# Patient Record
Sex: Male | Born: 1960
Health system: Southern US, Community
[De-identification: ages and names within clinical notes are randomized; demographics above are authoritative.]

## PROBLEM LIST (undated history)

## (undated) DIAGNOSIS — I4819 Other persistent atrial fibrillation: Secondary | ICD-10-CM

## (undated) DIAGNOSIS — I5042 Chronic combined systolic (congestive) and diastolic (congestive) heart failure: Secondary | ICD-10-CM

## (undated) DIAGNOSIS — I472 Ventricular tachycardia, unspecified: Secondary | ICD-10-CM

## (undated) DIAGNOSIS — Z95810 Presence of automatic (implantable) cardiac defibrillator: Secondary | ICD-10-CM

## (undated) DIAGNOSIS — I509 Heart failure, unspecified: Secondary | ICD-10-CM

## (undated) DIAGNOSIS — I340 Nonrheumatic mitral (valve) insufficiency: Secondary | ICD-10-CM

## (undated) DIAGNOSIS — R51 Headache: Secondary | ICD-10-CM

## (undated) DIAGNOSIS — R55 Syncope and collapse: Secondary | ICD-10-CM

## (undated) DIAGNOSIS — R42 Dizziness and giddiness: Secondary | ICD-10-CM

## (undated) DIAGNOSIS — R5383 Other fatigue: Secondary | ICD-10-CM

## (undated) DIAGNOSIS — R5381 Other malaise: Secondary | ICD-10-CM

## (undated) DIAGNOSIS — R519 Headache, unspecified: Secondary | ICD-10-CM

## (undated) DIAGNOSIS — I071 Rheumatic tricuspid insufficiency: Secondary | ICD-10-CM

## (undated) DIAGNOSIS — Z87442 Personal history of urinary calculi: Secondary | ICD-10-CM

## (undated) HISTORY — DX: Nonrheumatic mitral (valve) insufficiency: I34.0

## (undated) HISTORY — DX: Ventricular tachycardia, unspecified: I47.20

## (undated) HISTORY — DX: Rheumatic tricuspid insufficiency: I07.1

## (undated) HISTORY — DX: Other malaise: R53.81

## (undated) HISTORY — PX: CARDIAC CATHETERIZATION: SHX172

## (undated) HISTORY — PX: OTHER SURGICAL HISTORY: SHX169

## (undated) HISTORY — DX: Chronic combined systolic (congestive) and diastolic (congestive) heart failure: I50.42

## (undated) HISTORY — DX: Other malaise: R53.83

## (undated) HISTORY — DX: Ventricular tachycardia: I47.2

## (undated) HISTORY — DX: Dizziness and giddiness: R42

## (undated) HISTORY — PX: CARDIAC DEFIBRILLATOR PLACEMENT: SHX171

## (undated) HISTORY — DX: Other persistent atrial fibrillation: I48.19

## (undated) HISTORY — PX: LITHOTRIPSY: SUR834

## (undated) HISTORY — DX: Syncope and collapse: R55

## (undated) HISTORY — DX: Presence of automatic (implantable) cardiac defibrillator: Z95.810

---

## 1998-08-09 ENCOUNTER — Encounter: Payer: Self-pay | Admitting: Cardiology

## 1998-08-09 ENCOUNTER — Ambulatory Visit (HOSPITAL_COMMUNITY): Admission: RE | Admit: 1998-08-09 | Discharge: 1998-08-09 | Payer: Self-pay | Admitting: Cardiology

## 1998-08-16 ENCOUNTER — Ambulatory Visit (HOSPITAL_COMMUNITY): Admission: RE | Admit: 1998-08-16 | Discharge: 1998-08-16 | Payer: Self-pay | Admitting: Cardiology

## 1998-10-15 ENCOUNTER — Observation Stay (HOSPITAL_COMMUNITY): Admission: AD | Admit: 1998-10-15 | Discharge: 1998-10-16 | Payer: Self-pay | Admitting: Internal Medicine

## 1998-10-15 ENCOUNTER — Encounter: Payer: Self-pay | Admitting: Internal Medicine

## 1998-10-16 ENCOUNTER — Encounter: Payer: Self-pay | Admitting: Internal Medicine

## 2001-03-02 HISTORY — PX: LAPAROSCOPIC CHOLECYSTECTOMY: SUR755

## 2001-09-07 ENCOUNTER — Inpatient Hospital Stay (HOSPITAL_COMMUNITY): Admission: EM | Admit: 2001-09-07 | Discharge: 2001-09-08 | Payer: Self-pay | Admitting: Emergency Medicine

## 2001-09-07 ENCOUNTER — Encounter: Payer: Self-pay | Admitting: Emergency Medicine

## 2001-11-14 ENCOUNTER — Encounter (INDEPENDENT_AMBULATORY_CARE_PROVIDER_SITE_OTHER): Payer: Self-pay | Admitting: *Deleted

## 2001-11-15 ENCOUNTER — Inpatient Hospital Stay (HOSPITAL_COMMUNITY): Admission: EM | Admit: 2001-11-15 | Discharge: 2001-11-17 | Payer: Self-pay | Admitting: Emergency Medicine

## 2001-11-15 ENCOUNTER — Encounter: Payer: Self-pay | Admitting: Cardiology

## 2001-11-16 ENCOUNTER — Encounter: Payer: Self-pay | Admitting: Cardiology

## 2002-02-07 ENCOUNTER — Ambulatory Visit (HOSPITAL_COMMUNITY): Admission: RE | Admit: 2002-02-07 | Discharge: 2002-02-07 | Payer: Self-pay | Admitting: Pulmonary Disease

## 2002-02-07 ENCOUNTER — Encounter: Payer: Self-pay | Admitting: Pulmonary Disease

## 2003-10-28 ENCOUNTER — Emergency Department (HOSPITAL_COMMUNITY): Admission: EM | Admit: 2003-10-28 | Discharge: 2003-10-28 | Payer: Self-pay | Admitting: Emergency Medicine

## 2003-11-13 ENCOUNTER — Observation Stay (HOSPITAL_COMMUNITY): Admission: RE | Admit: 2003-11-13 | Discharge: 2003-11-13 | Payer: Self-pay | Admitting: Internal Medicine

## 2004-04-29 ENCOUNTER — Ambulatory Visit: Payer: Self-pay

## 2004-12-15 ENCOUNTER — Ambulatory Visit: Payer: Self-pay | Admitting: Internal Medicine

## 2005-04-14 ENCOUNTER — Ambulatory Visit: Payer: Self-pay | Admitting: Internal Medicine

## 2005-12-17 ENCOUNTER — Ambulatory Visit: Payer: Self-pay

## 2006-03-18 ENCOUNTER — Ambulatory Visit: Payer: Self-pay | Admitting: Internal Medicine

## 2006-06-17 ENCOUNTER — Ambulatory Visit: Payer: Self-pay | Admitting: Internal Medicine

## 2006-08-14 ENCOUNTER — Ambulatory Visit: Payer: Self-pay | Admitting: Internal Medicine

## 2006-12-15 ENCOUNTER — Ambulatory Visit: Payer: Self-pay | Admitting: Internal Medicine

## 2007-03-17 ENCOUNTER — Ambulatory Visit: Payer: Self-pay | Admitting: Internal Medicine

## 2007-06-16 ENCOUNTER — Ambulatory Visit: Payer: Self-pay | Admitting: Internal Medicine

## 2007-12-06 ENCOUNTER — Ambulatory Visit: Payer: Self-pay | Admitting: Internal Medicine

## 2008-02-06 ENCOUNTER — Encounter: Payer: Self-pay | Admitting: Internal Medicine

## 2008-02-13 ENCOUNTER — Ambulatory Visit: Payer: Self-pay | Admitting: Internal Medicine

## 2008-03-20 ENCOUNTER — Ambulatory Visit: Payer: Self-pay | Admitting: Internal Medicine

## 2008-03-20 LAB — CONVERTED CEMR LAB
BUN: 13 mg/dL (ref 6–23)
Basophils Absolute: 0 10*3/uL (ref 0.0–0.1)
Basophils Relative: 0.2 % (ref 0.0–3.0)
CO2: 32 meq/L (ref 19–32)
Calcium: 9.6 mg/dL (ref 8.4–10.5)
Chloride: 106 meq/L (ref 96–112)
Creatinine, Ser: 0.8 mg/dL (ref 0.4–1.5)
Eosinophils Absolute: 0.1 10*3/uL (ref 0.0–0.7)
Eosinophils Relative: 2.3 % (ref 0.0–5.0)
GFR calc Af Amer: 133 mL/min
GFR calc non Af Amer: 110 mL/min
Glucose, Bld: 97 mg/dL (ref 70–99)
HCT: 43.2 % (ref 39.0–52.0)
Hemoglobin: 15 g/dL (ref 13.0–17.0)
INR: 1 (ref 0.8–1.0)
Lymphocytes Relative: 26.4 % (ref 12.0–46.0)
MCHC: 34.8 g/dL (ref 30.0–36.0)
MCV: 90.3 fL (ref 78.0–100.0)
Monocytes Absolute: 0.6 10*3/uL (ref 0.1–1.0)
Monocytes Relative: 9.5 % (ref 3.0–12.0)
Neutro Abs: 3.7 10*3/uL (ref 1.4–7.7)
Neutrophils Relative %: 61.6 % (ref 43.0–77.0)
Platelets: 171 10*3/uL (ref 150–400)
Potassium: 4.4 meq/L (ref 3.5–5.1)
Prothrombin Time: 10.4 s — ABNORMAL LOW (ref 10.9–13.3)
RBC: 4.78 M/uL (ref 4.22–5.81)
RDW: 12.1 % (ref 11.5–14.6)
Sodium: 140 meq/L (ref 135–145)
WBC: 6 10*3/uL (ref 4.5–10.5)
aPTT: 37.5 s — ABNORMAL HIGH (ref 21.7–29.8)

## 2008-03-28 ENCOUNTER — Ambulatory Visit (HOSPITAL_COMMUNITY): Admission: RE | Admit: 2008-03-28 | Discharge: 2008-03-28 | Payer: Self-pay | Admitting: Internal Medicine

## 2008-03-28 ENCOUNTER — Ambulatory Visit: Payer: Self-pay | Admitting: Internal Medicine

## 2008-04-11 ENCOUNTER — Ambulatory Visit: Payer: Self-pay

## 2008-04-11 ENCOUNTER — Encounter: Payer: Self-pay | Admitting: Internal Medicine

## 2008-06-05 ENCOUNTER — Encounter: Payer: Self-pay | Admitting: Internal Medicine

## 2008-06-05 ENCOUNTER — Ambulatory Visit: Payer: Self-pay | Admitting: Internal Medicine

## 2008-06-05 DIAGNOSIS — Z95810 Presence of automatic (implantable) cardiac defibrillator: Secondary | ICD-10-CM | POA: Insufficient documentation

## 2008-06-05 DIAGNOSIS — I422 Other hypertrophic cardiomyopathy: Secondary | ICD-10-CM | POA: Insufficient documentation

## 2008-09-03 ENCOUNTER — Ambulatory Visit: Payer: Self-pay | Admitting: Internal Medicine

## 2008-09-13 ENCOUNTER — Telehealth: Payer: Self-pay | Admitting: Internal Medicine

## 2008-09-17 ENCOUNTER — Encounter: Payer: Self-pay | Admitting: Internal Medicine

## 2008-12-10 ENCOUNTER — Ambulatory Visit: Payer: Self-pay | Admitting: Internal Medicine

## 2008-12-20 ENCOUNTER — Encounter: Payer: Self-pay | Admitting: Internal Medicine

## 2009-01-14 ENCOUNTER — Encounter: Payer: Self-pay | Admitting: Internal Medicine

## 2009-03-05 ENCOUNTER — Ambulatory Visit: Payer: Self-pay | Admitting: Internal Medicine

## 2009-03-05 DIAGNOSIS — R079 Chest pain, unspecified: Secondary | ICD-10-CM | POA: Insufficient documentation

## 2009-06-02 ENCOUNTER — Encounter: Payer: Self-pay | Admitting: Internal Medicine

## 2009-06-03 ENCOUNTER — Ambulatory Visit: Payer: Self-pay | Admitting: Internal Medicine

## 2009-06-19 ENCOUNTER — Encounter: Payer: Self-pay | Admitting: Internal Medicine

## 2009-09-01 ENCOUNTER — Encounter: Payer: Self-pay | Admitting: Internal Medicine

## 2009-09-02 ENCOUNTER — Ambulatory Visit: Payer: Self-pay | Admitting: Internal Medicine

## 2009-09-18 ENCOUNTER — Encounter: Payer: Self-pay | Admitting: Internal Medicine

## 2009-10-22 ENCOUNTER — Ambulatory Visit: Payer: Self-pay | Admitting: Internal Medicine

## 2009-10-22 DIAGNOSIS — R5383 Other fatigue: Secondary | ICD-10-CM

## 2009-10-22 DIAGNOSIS — R5381 Other malaise: Secondary | ICD-10-CM | POA: Insufficient documentation

## 2009-10-22 DIAGNOSIS — R55 Syncope and collapse: Secondary | ICD-10-CM | POA: Insufficient documentation

## 2010-01-23 ENCOUNTER — Ambulatory Visit: Payer: Self-pay | Admitting: Internal Medicine

## 2010-02-13 ENCOUNTER — Encounter (INDEPENDENT_AMBULATORY_CARE_PROVIDER_SITE_OTHER): Payer: Self-pay | Admitting: *Deleted

## 2010-04-03 NOTE — Progress Notes (Signed)
Summary: MAIL RX TO PT   Phone Note Refill Request Call back at [phone] Message from:  Patient  Refills Requested: Medication #1:  ATENOLOL 50 MG TABS 1 tablet once daily   Supply Requested: 3 months MAIL RX TO PATIENT   Method Requested: Mail to Patient Initial call taken by: Burnard Leigh,  September 13, 2008 10:27 AM Caller: Patient Reason for Call: Talk to Nurse  Follow-up for Phone Call        pt aware that refill was mailed Follow-up by: Duncan Dull, RN, BSN,  September 13, 2008 2:34 PM

## 2010-04-03 NOTE — Assessment & Plan Note (Signed)
Summary: pc2/ gd      Allergies Added: NKDA  History of Present Illness: MR Blevens returns in followup for ICD implanted for secondary prevention in the setting of hypertrophic cardiomyopathy. He underwent generator replacement in January.2010.    He has been working on losing weight. He has cut down on fats foods. He has also been cutting down on salt to help with his blood pressure. He does snore and has some daytime somnolence.  he also had a spell in early May of syncope. He had gotten out of the car walked to the break room put down his lunch and became abruptly lightheaded. He held on to the chair hoping that it would stabilize and he ended up on the floor. This is associated with a sense of flushing and some recovery to date. He recognizesthe associated epiphenomena    Current Medications (verified): 1)  Atenolol 50 Mg Tabs (Atenolol) .Marland Kitchen.. 1 Tablet Once Daily 2)  Claritin Reditabs 10 Mg Tbdp (Loratadine) .... Once Daily As Needed  Allergies (verified): No Known Drug Allergies  Past History:  Past Medical History: Last updated: 05/30/2008 Hypertrophic cardiomyopathy.  Nonsustained ventricular tachycardia.  Status post ICD for primary prevention Hx of depression Hypertension  Past Surgical History: Last updated: 05/30/2008 Guidant Vitality ICD Impantation--Guidant Vitality T135--11/13/2003 laparoscopic cholecystectomy  Family History: Last updated: 05/30/2008 Non disclosed first generation family member--sudden death from cardiac arrhythmia and essential hypertension.    Social History: Last updated: 05/30/2008 Married--2 children Tobacco Use - No.  Alcohol Use - yes--rarely Regular Exercise - no Drug Use - no  Vital Signs:  Patient profile:   50 year old male Height:      67 inches Weight:      196 pounds BMI:     30.81 Pulse rate:   75 / minute Pulse rhythm:   regular BP sitting:   120 / 90  (left arm) Cuff size:   regular  Vitals Entered By: Judithe Modest CMA (October 22, 2009 12:03 PM)  Physical Exam  General:  The patient was alert and oriented in no acute distress. HEENT Normal.  Neck veins were flat, carotids were brisk.  Lungs were clear.  Heart sounds were regular without murmurs or gallops.  Abdomen was soft with active bowel sounds. There is no clubbing cyanosis or edema. Skin Warm and dry     ICD Specifications Following MD:  Sherryl Manges, MD     ICD Vendor:  St Jude     ICD Model Number:  [phone]     ICD Serial Number:  [id number] ICD DOI:  03/28/2008     ICD Implanting MD:  Sherryl Manges, MD  Lead 1:    Location: RV     DOI: 10/15/1998     Model #: [id number]     Serial #: [id number]     Status: active  Indications::  HOCM  Explantation Comments: Merlin  ICD Follow Up Battery Voltage:  3.17 V     Charge Time:  11.3 seconds     Battery Est. Longevity:  6.3-6.7 yrs Underlying rhythm:  SR ICD Dependent:  No       ICD Device Measurements Right Ventricle:  Amplitude: 12.0 mV, Impedance: 690 ohms, Threshold: 0.75 V at 0.5 msec Shock Impedance: 53 ohms   Episodes MS Episodes:  0     Shock:  0     ATP:  0     Nonsustained:  0     Ventricular Pacing:  <1%  Huston Foley  Parameters Mode VVI     Lower Rate Limit:  40      Tachy Zones VF:  240     VT:  214     VT1:  160     Next Remote Date:  01/23/2010     Next Cardiology Appt Due:  10/01/2010 Tech Comments:  NORMAL DEVICE FUNCTION.  NO CHANGES MADE. MERLIN CHECK 01-23-10. ROV IN 12 MTHS W/SK. Vella Kohler  October 22, 2009 12:12 PM  Impression & Recommendations:  Problem # 1:  SYNCOPE (ICD-780.2) the patient's episode likely represents a message to vasomotor instability. He is advised to try to sit down instead of hold onto a chair with recurrent symptoms;  these episodes have never occurred while she His updated medication list for this problem includes:    Atenolol 50 Mg Tabs (Atenolol) .Marland Kitchen... 1 tablet once daily  Problem # 2:  TIREDNESS (ICD-780.79) he has daytime somnolence  and snoring. I went over there he has sleep apnea. He is trying to lose weight. I asked that he talk with his wife to see if he has any nocturnal apnea. He is to let us know. In the event that he does undertaking a sleep study would be appropriate. I've encouraged in this regard and ongoing weight loss efforts  Problem # 3:  VENTRICULAR TACHYCARDIA-NON-SUSTAINED (ICD-427.1) no recurrent ventricular tachycardia His updated medication list for this problem includes:    Atenolol 50 Mg Tabs (Atenolol) .Marland Kitchen... 1 tablet once daily  Problem # 4:  HYPERTROPHIC OBSTRUCTIVE CARDIOMYOPATHY (ICD-425.1) stable on his current medication His updated medication list for this problem includes:    Atenolol 50 Mg Tabs (Atenolol) .Marland Kitchen... 1 tablet once daily  Problem # 5:  AUTOMATIC IMPLANTABLE CARDIAC DEFIBRILLATOR SITU (ICD-V45.02) Device parameters and data were reviewed and no changes were made  Patient Instructions: 1)  Your physician recommends that you schedule a follow-up appointment in: 12 MONTHS WITH DR Graciela Husbands  2)  Your physician recommends that you continue on your current medications as directed. Please refer to the Current Medication list given to you today.

## 2010-04-03 NOTE — Letter (Signed)
Summary: Remote Device Check  [facility], Main Office  [address]   [place], [place] [address]   Phone: [phone]  Fax: [phone]     September 17, 2008 MRN: [id number]   [facility] [address] [place], [place]  [address]   Dear Mr. Aho,   Your remote transmission was recieved and reviewed by your physician.  All diagnostics were within normal limits for you.  ___X__Your next transmission is scheduled for:  December 10, 2008.  Please transmit at any time this day.  If you have a wireless device your transmission will be sent automatically.      Sincerely,  Proofreader

## 2010-04-03 NOTE — Cardiovascular Report (Signed)
Summary: Office Visit Remote  Office Visit Remote   Imported By: Roderic Ovens 06/19/2009 14:32:00  _____________________________________________________________________  External Attachment:    Type:   Image     Comment:   External Document

## 2010-04-03 NOTE — Cardiovascular Report (Signed)
Summary: Office Visit   Office Visit   Imported By: Roderic Ovens 10/23/2009 15:44:39  _____________________________________________________________________  External Attachment:    Type:   Image     Comment:   External Document

## 2010-04-03 NOTE — Cardiovascular Report (Signed)
Summary: Office Visit   Office Visit   Imported By: Roderic Ovens 03/05/2009 15:16:57  _____________________________________________________________________  External Attachment:    Type:   Image     Comment:   External Document

## 2010-04-03 NOTE — Miscellaneous (Signed)
Summary: Device preload  Clinical Lists Changes  Observations: Added new observation of ICDEXPLCOMM: Merlin (04/11/2008 9:52) Added new observation of ICD INDICATN: HOCM (04/11/2008 9:52) Added new observation of ICDLEADSTAT1: active (04/11/2008 9:52) Added new observation of ICDLEADSER1: 161096  (04/11/2008 9:52) Added new observation of ICDLEADMOD1: 0144  (04/11/2008 9:52) Added new observation of ICDLEADDOI1: 10/15/1998  (04/11/2008 9:52) Added new observation of ICDLEADLOC1: RV  (04/11/2008 9:52) Added new observation of ICD IMP MD: Sherryl Manges, MD  (04/11/2008 9:52) Added new observation of ICD IMPL DTE: 03/28/2008  (04/11/2008 9:52) Added new observation of ICD SERL#: [id number]  (04/11/2008 9:52) Added new observation of ICD MODL#: 1211-36  (04/11/2008 9:52) Added new observation of ICDMANUFACTR: St Jude  (04/11/2008 9:52) Added new observation of CARDIO MD: Sherryl Manges, MD  (04/11/2008 9:52)      PPM Specifications Following MD:  Sherryl Manges, MD       ICD Specifications Following MD: Sherryl Manges, MD      ICD Vendor: St Jude     ICD Model Number: [id number]     ICD Serial Number: [id number]  ICD DOI: 03/28/2008     ICD Implanting MD: Sherryl Manges, MD  Lead 1:    Location: RV     DOI: 10/15/1998     Model #: [id number]     Serial #: [id number]     Status: active  Indications::  HOCM  Explantation Comments: Arsenio Katz

## 2010-04-03 NOTE — Cardiovascular Report (Signed)
Summary: Office Visit Remote   Office Visit Remote   Imported By: Roderic Ovens 02/14/2010 09:37:10  _____________________________________________________________________  External Attachment:    Type:   Image     Comment:   External Document

## 2010-04-03 NOTE — Letter (Signed)
Summary: Remote Device Check  [facility], Main Office  [address]   [place], [place] [address]   Phone: [phone]  Fax: [phone]     December 20, 2008 MRN: [id number]   [facility] [address] [place], [place]  [address]   Dear Mr. Laye,   Your remote transmission was recieved and reviewed by your physician.  All diagnostics were within normal limits for you.    ___X___Your next office visit is scheduled for:  January 2011 with Dr Graciela Husbands. Please call our office to schedule an appointment.    Sincerely,  Proofreader

## 2010-04-03 NOTE — Letter (Signed)
Summary: Remote Device Check  [facility], Main Office  [address]   [place], [place] [address]   Phone: [phone]  Fax: [phone]     June 19, 2009 MRN: [id number]   [facility] [address] [place], [place]  [address]   Dear Donald Bond,   Your remote transmission was recieved and reviewed by your physician.  All diagnostics were within normal limits for you.  __X___Your next transmission is scheduled for:       September 02, 2009.  Please transmit at any time this day.  If you have a wireless device your transmission will be sent automatically.     Sincerely,  Proofreader

## 2010-04-03 NOTE — Assessment & Plan Note (Signed)
Summary: [facility]   History of Present Illness: for Clagg returns in followup for ICD implanted for secondary prevention in the setting of hypertrophic cardiomyopathy. He underwent generator replacement in January.he is concerned that there is some tenderness and some redness overlying the device pocket.  Current Medications (verified): 1)  Atenolol 50 Mg Tabs (Atenolol) .Marland Kitchen.. 1 Tablet Once Daily 2)  Claritin Reditabs 10 Mg Tbdp (Loratadine) .... Once Daily As Needed 3)  Ibuprofen  Allergies (verified): No Known Drug Allergies  Vital Signs:  Patient profile:   50 year old male Height:      67 inches Weight:      190.25 pounds BMI:     29.91 Pulse rate:   60 / minute Pulse rhythm:   regular BP sitting:   134 / 88  (left arm) Cuff size:   regular  Vitals Entered By: Judithe Modest CMA (June 05, 2008 11:14 AM)  Physical Exam  General:  Well developed, well nourished, in no acute distress. Neck:  Neck supple, no JVD. No masses, thyromegaly or abnormal cervical nodes. Chest Wall:  Device pocket was well-healed there is minimal warmth there is no erythema. There is mild tenderness. Lungs:  Clear bilaterally to auscultation and percussion. Heart:  Non-displaced PMI, chest non-tender; regular rate and rhythm, S1, S2 without murmurs, rubs or gallops.  No edema, no varicosities.   ICD Specifications ICD Vendor:  St Jude     ICD Model Number:  [id number]     ICD Serial Number:  [id number] ICD DOI:  03/28/2008     ICD Implanting MD:  Sherryl Manges, MD  Lead 1:    Location: RV     DOI: 10/15/1998     Model #: 0144     Serial #: [id number]     Status: active  Indications::  HOCM  Explantation Comments: Merlin  ICD Specs Remote Check?  No Battery Voltage:  3.20 V     Charge Time:  10.3 seconds     Underlying rhythm:  SR@60WITH  PAC'S ICD Dependent:  No       ICD Device Measurements Atrium:  Right Ventricle:  Amplitude: 11.7 mV, Impedance: 650 ohms, Threshold: .75 V at .5  msec Left Ventricle:  Shock Impedance: 52 ohms   Episodes Shock:  0     ATP:  0     Nonsustained:  0     Ventricular Pacing:  1  Brady Parameters Mode VVI     Lower Rate Limit:  40      Tachy Zones VF:  240     VT:  214     VT1:  160     Next Remote Date:  09/03/2008     Impression & Recommendations:  Problem # 1:  AUTOMATIC IMPLANTABLE CARDIAC DEFIBRILLATOR SITU (ICD-V45.02) There is no intercurrent episodes of ventricular tachycardia. The patient's site is a little bit warm. We'll plan to have him follow up with [place] in the next 4-6 weeks it is not improving. Otherwise we'll see him in 9 months time.  Problem # 2:  HYPERTROPHIC OBSTRUCTIVE CARDIOMYOPATHY (ICD-425.1) Stable. His updated medication list for this problem includes:    Atenolol 50 Mg Tabs (Atenolol) .Marland Kitchen... 1 tablet once daily  Patient Instructions: 1)  patient instructions are as noted above.

## 2010-04-03 NOTE — Cardiovascular Report (Signed)
Summary: Remote Office Visit  Remote Office Visit   Imported By: Marylou Mccoy 10/05/2008 14:06:35  _____________________________________________________________________  External Attachment:    Type:   Image     Comment:   External Document

## 2010-04-03 NOTE — Letter (Signed)
Summary: Remote Device Check  [facility], Main Office  [address]   [place], [place] [address]   Phone: [phone]  Fax: [phone]     September 18, 2009 MRN: [id number]   [facility] [address] [place], [place]  [address]   Dear Mr. Brittian,   Your remote transmission was recieved and reviewed by your physician.  All diagnostics were within normal limits for you.   ___X___Your next office visit is scheduled for:   10-22-09 @ 1210 with Dr Graciela Husbands. Please call our office to schedule an appointment.    Sincerely,  Vella Kohler

## 2010-04-03 NOTE — Cardiovascular Report (Signed)
Summary: Office Visit Remote   Office Visit Remote   Imported By: Roderic Ovens 09/18/2009 16:13:29  _____________________________________________________________________  External Attachment:    Type:   Image     Comment:   External Document

## 2010-04-03 NOTE — Assessment & Plan Note (Signed)
Summary: [facility] 9 MO F/U      Allergies Added: NKDA  History of Present Illness: Donald Bond returns in followup for ICD implanted for secondary prevention in the setting of hypertrophic cardiomyopathy. He underwent generator replacement in January.2010.   He is concerned however about ongoing  discomfort at his defibrillatorr site. This is new since his device generator was replaced in January 2010. He's had no fevers.  there has been no swelling. There has been no redness.  Current Medications (verified): 1)  Atenolol 50 Mg Tabs (Atenolol) .Marland Kitchen.. 1 Tablet Once Daily 2)  Claritin Reditabs 10 Mg Tbdp (Loratadine) .... Once Daily As Needed  Allergies (verified): No Known Drug Allergies  Past History:  Past Medical History: Last updated: 05/30/2008 Hypertrophic cardiomyopathy.  Nonsustained ventricular tachycardia.  Status post ICD for primary prevention Hx of depression Hypertension  Past Surgical History: Last updated: 05/30/2008 Guidant Vitality ICD Impantation--Guidant Vitality T135--11/13/2003 laparoscopic cholecystectomy  Family History: Last updated: 05/30/2008 Non disclosed first generation family member--sudden death from cardiac arrhythmia and essential hypertension.    Social History: Last updated: 05/30/2008 Married--2 children Tobacco Use - No.  Alcohol Use - yes--rarely Regular Exercise - no Drug Use - no  Vital Signs:  Patient profile:   50 year old male Height:      67 inches Weight:      196 pounds BMI:     30.81 Pulse rate:   62 / minute Pulse rhythm:   regular BP sitting:   130 / 84  (left arm) Cuff size:   large  Vitals Entered By: Judithe Modest CMA (March 05, 2009 9:50 AM)  Physical Exam  General:  The patient was alert and oriented in no acute distress. HEENT Normal.  Neck veins were flat, carotids were brisk.  Chest wall is non-erythematous. There is a little bit of warmth over his device pocket. There is no tenderness. Lungs were  clear.  Heart sounds were regular without murmurs or gallops.  Abdomen was soft with active bowel sounds. There is no clubbing cyanosis or edema. Skin Warm and dry     ICD Specifications Following MD:  Sherryl Manges, MD     ICD Vendor:  St Jude     ICD Model Number:  [id number]     ICD Serial Number:  [id number] ICD DOI:  03/28/2008     ICD Implanting MD:  Sherryl Manges, MD  Lead 1:    Location: RV     DOI: 10/15/1998     Model #: 1324     Serial #: [id number]     Status: active  Indications::  HOCM  Explantation Comments: Merlin  ICD Follow Up Remote Check?  No Battery Voltage:  3.20 V     Charge Time:  11.4 seconds     Battery Est. Longevity:  7.4 years Underlying rhythm:  SR ICD Dependent:  No       ICD Device Measurements Right Ventricle:  Amplitude: 11.7 mV, Impedance: 690 ohms, Threshold: 0.75 V at 0.5 msec Shock Impedance: 56 ohms   Episodes Shock:  0     ATP:  0     Nonsustained:  1      Brady Parameters Mode VVI     Lower Rate Limit:  40      Tachy Zones VF:  240     VT:  214     VT1:  160     Next Remote Date:  06/03/2009     Next Cardiology  Appt Due:  03/02/2010 Tech Comments:  No parameter changes.  Device function normal.  1 episode VT1 zone, monitor only.  Merlin transmissions every 3 months.  ROV 1 year Dr. Graciela Husbands. Altha Harm, LPN  March 05, 2009 10:08 AM   Impression & Recommendations:  Problem # 1:  VENTRICULAR TACHYCARDIA-NON-SUSTAINED (ICD-427.1) The patient has had 2 intercurrent episodes of nonsustained ventricular tachycardia one was identified in the VT/BSO and the other was identified in the DT one monitoring zone; the latter was longer. There were no associated symptoms. His updated medication list for this problem includes:    Atenolol 50 Mg Tabs (Atenolol) .Marland Kitchen... 1 tablet once daily  Problem # 2:  CHEST PAIN UNSPECIFIED (ICD-786.50) I'm concerned that this chronic low-grade discomfort represents a "chronic low-grade" infection. There is no evidence of  that at present. I told him that this is a concern. This may also be related to healing and scar tissue. I told to keep a watch and to let us know and we will continue to follow it. His updated medication list for this problem includes:    Atenolol 50 Mg Tabs (Atenolol) .Marland Kitchen... 1 tablet once daily  Problem # 3:  AUTOMATIC IMPLANTABLE CARDIAC DEFIBRILLATOR SITU (ICD-V45.02) device function is normal  Problem # 4:  HYPERTROPHIC OBSTRUCTIVE CARDIOMYOPATHY (ICD-425.1) currently relatively well compensated; he should continue on his beta blocker His updated medication list for this problem includes:    Atenolol 50 Mg Tabs (Atenolol) .Marland Kitchen... 1 tablet once daily  Patient Instructions: 1)  Your physician recommends that you schedule a follow-up appointment in: 6 months Prescriptions: ATENOLOL 50 MG TABS (ATENOLOL) 1 tablet once daily  #30 x 11   Entered by:   Duncan Dull, RN, BSN   Authorized by:   Nathen May, MD, [facility]   Signed by:   Duncan Dull, RN, BSN on 03/05/2009   Method used:   Electronically to        [facility] [phone]* (retail)       [address] [place], [place]  [address]       Ph: [phone]       Fax: [phone]   RxID:   [id number]

## 2010-04-03 NOTE — Letter (Signed)
Summary: Remote Device Check  [facility], Main Office  [address]   [place], [place] [address]   Phone: [phone]  Fax: [phone]     February 13, 2010 MRN: [id number]   [facility] [address] [place], [place]  [address]   Dear Mr. Lejeune,   Your remote transmission was recieved and reviewed by your physician.  All diagnostics were within normal limits for you.  _____Your next transmission is scheduled for:                       .  Please transmit at any time this day.  If you have a wireless device your transmission will be sent automatically.  _X_____Your next office visit is scheduled for: January 2012 wth Dr. Graciela Husbands.    Please call our office to schedule an appointment.    Sincerely,  Altha Harm, LPN

## 2010-04-15 ENCOUNTER — Encounter (INDEPENDENT_AMBULATORY_CARE_PROVIDER_SITE_OTHER): Payer: BC Managed Care – PPO | Admitting: Internal Medicine

## 2010-04-15 ENCOUNTER — Encounter: Payer: Self-pay | Admitting: Internal Medicine

## 2010-04-15 DIAGNOSIS — Z95810 Presence of automatic (implantable) cardiac defibrillator: Secondary | ICD-10-CM

## 2010-04-15 DIAGNOSIS — R42 Dizziness and giddiness: Secondary | ICD-10-CM

## 2010-04-15 DIAGNOSIS — I4729 Other ventricular tachycardia: Secondary | ICD-10-CM

## 2010-04-15 DIAGNOSIS — I472 Ventricular tachycardia: Secondary | ICD-10-CM

## 2010-04-15 DIAGNOSIS — I421 Obstructive hypertrophic cardiomyopathy: Secondary | ICD-10-CM

## 2010-04-23 NOTE — Assessment & Plan Note (Signed)
Summary: yearly/sl/sch per pt call/tmj/pt rs appt from bumplist/mt/kl      Allergies Added: NKDA  Visit Type:  device check Primary Provider:  Dr. Record  CC:  some soreness around device area.  History of Present Illness: Donald Bond returns in followup for ICD implanted for secondary prevention in the setting of hypertrophic cardiomyopathy. He underwent generator replacement in January.2010.    He has had no recurrent syncope.  He has had some LH  He also notes osme discomfort around his ICD site.  There has been some swelling but no obvious redness      Current Medications (verified): 1)  Atenolol 50 Mg Tabs (Atenolol) .Marland Kitchen.. 1 Tablet Once Daily 2)  Claritin Reditabs 10 Mg Tbdp (Loratadine) .... Once Daily As Needed  Allergies (verified): No Known Drug Allergies  Past History:  Past Medical History: Last updated: 05/30/2008 Hypertrophic cardiomyopathy.  Nonsustained ventricular tachycardia.  Status post ICD for primary prevention Hx of depression Hypertension  Past Surgical History: Last updated: 05/30/2008 Guidant Vitality ICD Impantation--Guidant Vitality T135--11/13/2003 laparoscopic cholecystectomy  Family History: Last updated: 05/30/2008 Non disclosed first generation family member--sudden death from cardiac arrhythmia and essential hypertension.    Social History: Last updated: 05/30/2008 Married--2 children Tobacco Use - No.  Alcohol Use - yes--rarely Regular Exercise - no Drug Use - no  Vital Signs:  Patient profile:   50 year old male Height:      67 inches Weight:      196.50 pounds BMI:     30.89 Pulse rate:   76 / minute Resp:     18 per minute BP sitting:   120 / 84  (right arm) Cuff size:   regular  Vitals Entered By: Celestia Khat, CMA (April 15, 2010 12:05 PM)  Physical Exam  General:  The patient was alert and oriented in no acute distress. HEENT Normal.  Neck veins were flat, carotids were brisk.  Lungs were clear.  Heart  sounds were regular without murmurs or gallops.  Abdomen was soft with active bowel sounds. There is no clubbing cyanosis or edema. Skin Warm and dry device pocket  mildly swollen, some erythema but also on contralateral chest, no tenderness and scar mobile    ICD Specifications Following MD:  Sherryl Manges, MD     ICD Vendor:  St Jude     ICD Model Number:  [id number]     ICD Serial Number:  [id number] ICD DOI:  03/28/2008     ICD Implanting MD:  Sherryl Manges, MD  Lead 1:    Location: RV     DOI: 10/15/1998     Model #: [id number]     Serial #: [id number]     Status: active  Indications::  HOCM  Explantation Comments: Merlin  ICD Follow Up ICD Dependent:  No      Brady Parameters Mode VVI     Lower Rate Limit:  40      Tachy Zones VF:  240     VT:  214     VT1:  160     Impression & Recommendations:  Problem # 1:  INF&INFLAM REACT DUE CARD DEVICE IMPLANT (ICD-996.61) there is always concern that discomfort portends chronic infection. There is currently no significant physical examination evidence to corroborate this hypothesis. We will have to keep and I on this. He's advised to let us know if there is worsening pain or the development of erythema.  Problem # 2:  SYNCOPE (ICD-780.2) no recurrent syncope; some  lightheadedness associated with exertion primarily lifting. This remains consistent with vasovagal events. His updated medication list for this problem includes:    Atenolol 50 Mg Tabs (Atenolol) .Marland Kitchen... 1 tablet once daily  Problem # 3:  AUTOMATIC IMPLANTABLE CARDIAC DEFIBRILLATOR SITU (ICD-V45.02) Device parameters and data were reviewed and no changes were made  Problem # 4:  HYPERTROPHIC OBSTRUCTIVE CARDIOMYOPATHY (ICD-425.1) we discussed the genetics of hypertrophic cardiomyopathy. At some point is 87/24 year old children will need to be screened. We will hold off for right now as there is no significant symptoms. Echo versus genetic screen will need to be the discussion. Currently  genetic screening is not available within the context of most insurers. His updated medication list for this problem includes:    Atenolol 50 Mg Tabs (Atenolol) .Marland Kitchen... 1 tablet once daily  Patient Instructions: 1)  Your physician recommends that you continue on your current medications as directed. Please refer to the Current Medication list given to you today. 2)  Your physician wants you to follow-up in: 6 MONTHS WITH DR Logan Bores will receive a reminder letter in the mail two months in advance. If you don't receive a letter, please call our office to schedule the follow-up appointment.

## 2010-04-29 NOTE — Cardiovascular Report (Signed)
Summary: Office Visit   Office Visit   Imported By: Roderic Ovens 04/23/2010 16:18:21  _____________________________________________________________________  External Attachment:    Type:   Image     Comment:   External Document

## 2010-07-15 NOTE — Assessment & Plan Note (Signed)
 HEALTHCARE                         ELECTROPHYSIOLOGY OFFICE NOTE   Donald Bond, MCAULIFFE                        MRN:          [id number]  DATE:02/13/2008                            DOB:          [birth year]    REQUESTING PHYSICIAN:  Donald Savage, MD   Mr. Donald Bond was seen today at the request of Dr. Elsie Bond because of a  noninvasive monitor that was utilized to try to identify arrhythmia  correlations with symptoms of gushing and lightheadedness.  It turns out  that these correlate with 2 different things, 1 is nonsustained  ventricular tachycardia described both as monomorphic and polymorphic as  well as sinus tachycardia with or without PACs and PVCs.   On reviewing his medications; he takes Toprol in the evenings and doing  at this time because it was energy depleting as well as Cardizem having  been started in the place of [facility].   PHYSICAL EXAMINATION:  VITAL SIGNS:  Today, his blood pressure was  elevated 152/80, although was just 130/61 when he saw Dr. Elsie Bond earlier  today.  His weight was 191 which is up 5 pounds in the last 3 months.  LUNGS:  Clear.  HEART:  Sounds are regular with an early systolic murmur.  ABDOMEN:  Soft.  EXTREMITIES:  Without edema.   Review of the monitor is as noted.  He also had a noninvasive stress  test which was normal and an echo which demonstrated asymmetric left  ventricular hypertrophy with dimensions of 1.5 and 1.2.  Interrogation  of his Guidant Vitality ICD demonstrates an R-wave of 22.6 with  impedance of 780 and threshold of 0.6 at 0.4; however, the impedance was  46 at battery voltage of 2.51.  There were again multiple nonsustained  episodes, some of these were sinus tachycardias, some of them were sinus  tachycardia with PACs or PVCs and some were nonsustained ventricular  tachycardia.   IMPRESSION:  1. Hypertrophic cardiomyopathy.  2. Status post implantable cardioverter-defibrillator for  the above.  3. Nonsustained ventricular tachycardia - symptomatic.  4. Sinus tachycardia slash supraventricular tachycardia - symptomatic.  5. Hypertension.   We will plan to try to initially adjust his beta-blockers.  We will he  take his Toprol from 25-50, and then I have given him prescriptions for  Inderal LA 120, nadolol 40, and atenolol 50 to try the alternatives in  the event that the Toprol dose is associated with intolerable side  effects of fatigue and/or lethargy.   As an alternatives that we could consider antiarrhythmic drugs  amiodarone and disopyramide are those with which he has a great  experience.  I do not know that.  I do not know there are data for  dronedarone and hypertrophic cardiomyopathy and I will look into the  question of sotalol.   Hope this note finds you well.     Donald Salvia, MD, [facility]  Electronically Signed    SCK/MedQ  DD: 02/13/2008  DT: 02/14/2008  Job #: [id number]   cc:   Donald Bond, M.D.

## 2010-07-15 NOTE — Assessment & Plan Note (Signed)
[facility]                         ELECTROPHYSIOLOGY OFFICE NOTE   OLSEN, MCCUTCHAN                        MRN:          [id number]  DATE:12/15/2006                            DOB:          March 06, 1960    SUBJECTIVE:  Mr. Raudenbush is seen.  He has a nonsustained ventricular  tachycardia in the setting of hypertrophic nonobstructive  cardiomyopathy.   He has no complaints.   CURRENT MEDICATIONS:  His only medication currently is Cardizem 180.   PHYSICAL EXAMINATION:  VITAL SIGNS:  On examination his blood pressure  is 135/85 with pulse of 67.  LUNGS:  Clear.  HEART:  Sounds were regular.  EXTREMITIES:  Without edema.  SKIN:  Warm and dry.   Interrogation of his Guidant Vitality ICD demonstrates an R wave of 25  with impedance of 793 with threshold of 0.6 at 0.4.  High voltage  impedance was 52 ohms.  Battery voltage was 2.59.   IMPRESSION:  1. Hypertrophic cardiomyopathy.  2. Nonsustained ventricular tachycardia.  3. Status post ICD for primary prevention.   DISCUSSION:  Mr. Porzio is stable.  I did review with him the need to  undertake surveillance of his funds.  He would like to talk about this  with his wife.  In the event that he calls back about this, I would  refer him to Dr. Rosiland Oz with the cardiac specialists' here in town.     Duke Salvia, MD, [facility]  Electronically Signed    SCK/MedQ  DD: 12/15/2006  DT: 12/16/2006  Job #: [id number]

## 2010-07-15 NOTE — Discharge Summary (Signed)
NAMEMARTYN, [name]                 ACCOUNT NO.:  [id number]   MEDICAL RECORD NO.:  [id number]          PATIENT TYPE:  OIB   LOCATION:  2899                         FACILITY:  [facility]   PHYSICIAN:  Duke Salvia, MD, FACCDATE OF BIRTH:  [birth year]   DATE OF ADMISSION:  03/28/2008  DATE OF DISCHARGE:  03/28/2008                               DISCHARGE SUMMARY   This patient has no known allergies.   Time for this dictation and discharge, greater than 35 minutes.   FINAL DIAGNOSES:  1. History of hypertrophic cardiomyopathy.  2. Symptomatic nonsustained ventricular tachycardia.  Symptoms are      lightheadedness.      a.     Much improved in symptomatology after starting beta-blocker       at office visit, February 13, 2008.      b.     He is now on atenolol 50 mg daily.  3. Family history of sudden cardiac death.  4. Implant Guidant Prizm single-chamber implantable cardioverter-      defibrillator in year 2000, now at ERI.  5. Implant on March 28, 2008, of St. Jude CURRENT + VR single-      chamber cardioverter-defibrillator by Dr. Sherryl Manges.  The      patient was discharged on the same day.  No hematoma.  Moderate      pain since there were some adhesions, which were revised during the      pocket exchange.   BRIEF HISTORY:  Donald Bond is a 50 year old male.  He has a history of  hypertrophic cardiomyopathy.  He had an ICD implanted in 2000.  He has  nonsustained ventricular tachycardia and isolated PVCs and these have  been very symptomatic.  He feels gushing and lightheadedness with this.  At an office visit on February 13, 2008, he was started on atenolol 50  mg daily.  When he saw Dr. Graciela Husbands, on January 19, his symptoms had been  greatly improved on atenolol.  He will maintain on this.  It is also  noted that his device is at elective replacement indicator and he will  present to [facility].   HOSPITAL COURSE:  The patient presents to [facility] on  January  27, electively.  He underwent explantation of his existing Guidant Prizm  device with implantation of the St. Jude CURRENT + VR single-chamber  cardioverted-defibrillator.  The patient discharging with instruction to  keep his incision dry for the next 7 days and to sponge bathe until  Wednesday, February 3.  He follows up at [facility], [address], [address] Wednesday, February 10, at 9:40.  He continues atenolol  50 mg daily.  For pain, a prescription has been given Percocet 5/325, 1-  2 times every 4-6 hours as needed.  The patient is asked to remove the  bandage in the morning of Thursday, January 28, and leave the incision  open to the air.   LABORATORY STUDIES:  Pertinent to this admission were drawn on January  19, sodium 140, potassium 4.4, chloride 106, bicarbonate 32,  glucose is  97, BUN is 13, creatinine 0.8, and complete blood count; white cells are  6, hemoglobin 15, hematocrit 43.2, and platelets are 171.  Protime is  10.4 and INR 1.0.      Donald Mirza, PA      Duke Salvia, MD, [facility]  Electronically Signed    GM/MEDQ  D:  03/28/2008  T:  03/29/2008  Job:  [id number]   cc:   Madaline Savage, M.D.  Duke Salvia, MD, [facility]

## 2010-07-15 NOTE — Assessment & Plan Note (Signed)
[facility]                         ELECTROPHYSIOLOGY OFFICE NOTE   Donald, Bond                        MRN:          [id number]  DATE:12/06/2007                            DOB:          1961-01-13    Donald Bond is seen in followup for hypertrophic cardiomyopathy and  nonsustained ventricular tachycardia.  He described a spell a couple of  weeks ago, where he got this brief rush to his head that if it were more  prolonged, it might have been associated with loss of consciousness.  It  was a new sensation for him.  He has not had any other problems with  exercise tolerance.   His current medications include Cardizem 180.   On examination, his blood pressure is mildly elevated at 153/86, his  pulse was 61.  His lungs were clear.  Heart sounds were regular.  Extremities were without edema.   Interrogation of his Guidant ICD demonstrates an R-wave of 25 with  impedance of 805, threshold of 0.6 at 0.4, battery voltage 2.55.  There  was 1 nonsustained episode detected in the zone above 210 beats per  minute.  This was in December 2008.  He was also ventricularly pacing  about 70% of the time.  I also created a 3-zone device, where 160-220 is  a monitoring-only zone.   We will plan to see him again in 3 months' time to see if there is any  correlation between identified arrhythmias and his symptoms.     Duke Salvia, MD, [facility]  Electronically Signed    SCK/MedQ  DD: 12/06/2007  DT: 12/07/2007  Job #: [id number]   cc:   Madaline Savage, M.D.

## 2010-07-15 NOTE — Op Note (Signed)
NAMELUISCARLOS, KACZMARCZYK                 ACCOUNT NO.:  [id number]   MEDICAL RECORD NO.:  [id number]          PATIENT TYPE:  OIB   LOCATION:  2899                         FACILITY:  [facility]   PHYSICIAN:  Duke Salvia, MD, FACCDATE OF BIRTH:  October 31, 1960   DATE OF PROCEDURE:  03/28/2008  DATE OF DISCHARGE:  03/28/2008                               OPERATIVE REPORT   PREOPERATIVE DIAGNOSES:  Hypertrophic cardiomyopathy, nonsustained  ventricular tachycardia, syncope, and family history of sudden death.   POSTOPERATIVE DIAGNOSES:  Hypertrophic cardiomyopathy, nonsustained  ventricular tachycardia, syncope, and family history of sudden death.   PROCEDURES:  1. Previously implanted implantable cardioverter-defibrillator, now at      end-of-life, now for device generator replacement.  2. Device explantation, implantable cardioverter-defibrillator      implantation, intraoperative defibrillation threshold testing, and      pocket revision.   Following obtaining informed consent, the patient was brought to the  electrophysiology laboratory and placed on the fluoroscopic table in a  supine position.  After routine prep and drape of the left upper chest,  lidocaine was infiltrated along the line of the previous incision and  carried down to layer of the device pocket using sharp dissection and  electrocautery.  The pocket was opened carefully.  The lead was actually  on top of the can on the lateral margin.  This was identified.  The  pocket was then opened further.  The device was explanted.  Interrogation of the previously implanted [employer] [id number] lead,  serial [id number] demonstrated an R-wave of 27.4 with an impedance of 944  with threshold of 0.4 volts at 0.5 milliseconds, current threshold was  0.4 mA.   Pacing to the distal coil had an impedance in the 900s as well as in the  proximal coil.   With these acceptable parameters recorded, the leads were attached to a  St. Jude current  [id number] ICD, serial [id number].  Through the device, the R-  wave was greater than 12 with a pace impedance of 660, threshold 0.5  volts at 0.5 milliseconds.  High-voltage impedance was 69 distally and  60 proximally.   At this point, the pocket was revised.  The pocket had to be extended  both caudally because of the footprint of the can as well as we elected  to excavate the posterior wall of the pocket to remove that scar tissue.  This was done.  Hemostasis was obtained.  The pocket was copiously  irrigated with antibiotic containing saline solution.  Hemostasis was  assured.  The leads and pulse generator were placed in the pocket and  secured to the prepectoral fascia.  Surgicel was placed behind the can  on this fresh surface as well as on the lateral and superior margin of  the device.  The device was secured to the prepectoral fascia.   Defibrillation threshold testing was then undertaken.  Ventricular  fibrillation was induced via the T-wave shock.  After total duration of  8 seconds, a 20-joule shock was delivered to a measured resistance of 46  ohms to terminate ventricular fibrillation and  restoring sinus rhythm.  The pocket was then closed in 3 layers in the normal fashion.  The wound was washed, dried, and a Benzoin and Steri-Strip dressing was  applied.  Needle counts, sponge counts, and instruments counts were  correct at the end of the procedure according to the staff.  The patient  tolerated the procedure without apparent complication.      Duke Salvia, MD, [facility]  Electronically Signed     SCK/MEDQ  D:  03/28/2008  T:  03/29/2008  Job:  [id number]

## 2010-07-15 NOTE — Assessment & Plan Note (Signed)
[facility]                         ELECTROPHYSIOLOGY OFFICE NOTE   GARMON, DEHN                        MRN:          [id number]  DATE:03/20/2008                            DOB:          Oct 30, 1960    Mr. Wafer is seen in followup for hypertrophic cardiomyopathy,  previously implanted ICD, symptomatic PVCs, and nonsustained ventricular  tachycardia.  These latter are much improved since we started him on  beta-blockers.  We tried him on atenolol, nadolol, and Inderal and all  of these, he liked the atenolol best and has had a marked improvement in  the frequency of his symptoms.   He has also reached ERI of his device.   His medications include only the atenolol at 50 mg a day.   He has no known drug allergies.   PHYSICAL EXAMINATION:  VITAL SIGNS:  His blood pressure was 122/81 with  a pulse of 62.  NECK:  Veins were flat.  LUNGS:  Clear.  HEART:  Sounds were regular without murmurs or gallops.  ABDOMEN:  Soft.  EXTREMITIES:  Without edema.   Interrogation of his Guidant vitality ICD demonstrates a reached ERI.   IMPRESSION:  1. Nonsustained ventricular tachycardia.  2. Hypertrophic cardiomyopathy.  3. Status post implantable cardioverter-defibrillator for the above,      now at elective replacement indicator.   Mr. Katzenstein has reached ERI of his device.  We have discussed treatment  options including replacement with the potential risk of infection.  He  understands these risks and he would like to proceed.  We will continue  him on his atenolol.     Duke Salvia, MD, [facility]  Electronically Signed    SCK/MedQ  DD: 03/20/2008  DT: 03/21/2008  Job #: [id number]

## 2010-07-17 ENCOUNTER — Encounter: Payer: BC Managed Care – PPO | Admitting: *Deleted

## 2010-07-18 ENCOUNTER — Encounter: Payer: Self-pay | Admitting: *Deleted

## 2010-07-18 NOTE — Discharge Summary (Signed)
Donald Bond, Donald Bond                           ACCOUNT NO.:  [id number]   MEDICAL RECORD NO.:  [id number]                   PATIENT TYPE:  INP   LOCATION:  2899                                 FACILITY:  [facility]   PHYSICIAN:  Duke Salvia, M.D.               DATE OF BIRTH:  March 31, 1960   DATE OF ADMISSION:  11/13/2003  DATE OF DISCHARGE:  11/13/2003                                 DISCHARGE SUMMARY   DISCHARGE DIAGNOSIS:  End of life indicated for Guidant 1850 cardioverter  defibrillator.   SECONDARY DIAGNOSIS:  1.  History of hypertrophic cardiomyopathy.  2.  Family history of sudden cardiac death.  3.  Status post implantable cardioverter defibrillator approximately five      years ago.   PROCEDURE:  November 13, 2003, generator change for implantable  cardioverter defibrillator status post implantation of Guidant Vitality DS  VR model T135 VR, serial number [id number], by Dr. Sherryl Manges.   DISPOSITION:  Donald Bond goes home after a three hour rest period.  He is  status post generator change out November 13, 2003.  He has had no post  procedure complications.  He had defibrillator testing equal to or less than  17 joules.  The incision is under a bandage but there is no swelling.  The  patient is not complaining of any abnormal tenderness there.  He will go  home with antibiotic coverage, Keflex 500 mg b.i.d. for five days.  He is  informed to keep the incision dry for the next week until Tuesday, September  20, when he can start showering.  He is asked to sponge bathe until then.  He has an office visit at [facility] pacer clinic Thursday,  November 29, 2003, at 10 o'clock in the morning and to see Dr. Graciela Husbands on  February 18, 2004, at 10:30.      Donald Bond, P.A.                    Duke Salvia, M.D.    GM/MEDQ  D:  11/13/2003  T:  11/13/2003  Job:  [id number]   cc:   Madaline Savage, M.D.  [address]  [place]  [place] [address]  Fax:  [phone]   [facility] Record  [address]  [place]  [place] [address]  Fax: [phone]

## 2010-07-18 NOTE — Op Note (Signed)
NAMEKRAMER, HANRAHAN                           ACCOUNT NO.:  [id number]   MEDICAL RECORD NO.:  [id number]                   PATIENT TYPE:  INP   LOCATION:  2029                                 FACILITY:  [facility]   PHYSICIAN:  Gita Kudo, M.D.              DATE OF BIRTH:  Jul 02, 1960   DATE OF PROCEDURE:  11/16/2001  DATE OF DISCHARGE:                                 OPERATIVE REPORT   PREOPERATIVE DIAGNOSIS:  Acute cholecystitis.   POSTOPERATIVE DIAGNOSIS:  Acute cholecystitis, gangrenous, normal-appearing  intraoperative cholangiogram.   PROCEDURE:  Laparoscopic cholecystectomy with intraoperative cholangiogram.   SURGEON:  Gita Kudo, M.D.   ASSISTANT:  Sandria Bales. Ezzard Standing, M.D.   ANESTHESIA:  General endotracheal.   CLINICAL HISTORY:  A 50 year old man with heart disease presented with  severe lower chest and upper abdominal pain in the emergency room.  Cardiac  causes ruled out.  He had documented gallstones over a year ago.  Repeat  studies again show gallstones.  His liver function studies are normal.   OPERATIVE FINDINGS:  The patient's gallbladder was acutely distended and had  patches of gangrene.   DESCRIPTION OF PROCEDURE:  Under satisfactory general endotracheal  anesthesia, having had preoperative Cefotan, the patient's abdomen prepped  and draped in a standard fashion.  His pacemaker had been turned off by the  rep and will be resumed in the PACU.   The transverse incision was made above the umbilicus and the midline opened  into the peritoneum.  Controlled with a figure-of-eight 0 Vicryl suture and  camera placed after good CO2 pneumoperitoneum established.  Through Marcaine-  infiltrated skin incisions, two #5 ports were placed laterally and a second  #10 medially.  The gallbladder was decompressed with the suction device and  then careful dissection performed.  The gallbladder-cystic duct junction was  carefully dissected after inflammatory adhesions  of omentum to the  gallbladder were taken down.  The cystic duct and artery were  circumferentially dissected and the artery controlled with multiple metal  clips and a single clip placed on the cystic duct near the gallbladder and  an incision made in the cystic duct and percutaneously-placed cholangiogram  catheter put into the duct and good x-rays taken.  Following this, the  catheter was withdrawn and the duct controlled with multiple clips distally  and then divided.  The gallbladder was then removed from below upward using  the coagulating spatula for hemostasis and dissection.  A large posterior  artery was encountered and controlled with clips and divided.  After the  gallbladder was tediously dissected away from the liver bed, the bed was  made dry by cautery and the gallbladder placed in an EndoCatch bag and  removed through the umbilicus.  Then the operative site was again checked  for hemostasis and a second liter of warm saline used to lavage it until the  returns were  clear and there was no evidence of any bleeding or bile leak.  Following this, the ports were removed under vision and then the CO2  released.  The midline closed with the previous figure-of-eight and a single  interrupted 0 Vicryl suture.  Also too infiltrated with Marcaine, subcu  approximated with 4-0 Vicryl, and the skin edges with Steri-Strips.  Sterile  absorbent dressings were applied and the patient went to the recovery room  from the operating room in good condition without complication.                                               Gita Kudo, M.D.    MRL/MEDQ  D:  11/16/2001  T:  11/17/2001  Job:  [id number]   cc:   Cristy Hilts. Jacinto Halim, M.D.

## 2010-07-18 NOTE — Op Note (Signed)
NAMENICOLI, NARDOZZI                           ACCOUNT NO.:  [id number]   MEDICAL RECORD NO.:  [id number]                   PATIENT TYPE:  INP   LOCATION:  2899                                 FACILITY:  [facility]   PHYSICIAN:  Duke Salvia, M.D.               DATE OF BIRTH:  10-01-1960   DATE OF PROCEDURE:  11/13/2003  DATE OF DISCHARGE:                                 OPERATIVE REPORT   PREOPERATIVE DIAGNOSIS:  Familial hypertrophic cardiomyopathy with a family  history of sudden cardiac death.   POSTOPERATIVE DIAGNOSIS:  Familial hypertrophic cardiomyopathy with a family  history of sudden cardiac death.   PROCEDURE:  Previously implanted defibrillator now at end of life with  defibrillator generator replacement and intraoperative defibrillation  threshold testing.   Following the obtainment of informed consent, the patient was brought to the  electrophysiology laboratory and placed on the fluoroscopic table in the  supine position.   After routine prep and drape, lidocaine was infiltrated along the line of a  previous incision and carried down to the layer of the device pocket using  sharp dissection and electrocautery.  It took an exceedingly long time to  cut through the scar tissue that was so vital and healthy around this  gentleman's defibrillator.  Also, the anchoring suture had come loose and  there was leads on the anterior aspect of the can both laterally and  medially.  Ultimately, however, the defibrillator was able to be freed up  without damage to the leads and the device was explanted.  Interrogation of  the previously implanted lead demonstrated an R-wave of 27, an impedance of  899, a threshold of 0.5 V at 0.5 msec with a current threshold to 0.78 mA.  The distal coil impedance was 889.  The proximal coil impedance was 899.  With these acceptable parameters recorded, the lead was then attached to a  Guidant vitality T135 VR ICD, model [id number].  __________  device, the bipolar  R-wave was 22 mV with a pacing impedance of 246 and a threshold of 0.5 V at  0.5 msec.  The high voltage impedance was normal but I do not have it in  front of me.   With the acceptable parameters recorded, defibrillation threshold testing  was undertaken.  Ventricular fibrillation was introduced via the T-wave  shock.  After a total duration of 5 seconds, a 17 joule shock was delivered  through a measured resistance of 42 ohms, terminating ventricular  fibrillation and restoring sinus rhythm.  With these acceptable parameters  recorded, the system was implanted.  The pocket was copiously irrigated with  antibiotic containing saline solution.  The pocket had to be enlarged  because of the different footprint of this device.  The device and lead were  placed in the pocket.  The wound was closed in three layers in the normal  fashion.  The wound  was washed, dried and a benzoin and Steri-Strip dressing was applied.  Needle counts, sponge counts and instrument counts were correct at the end  of the procedure according to the staff.  The patient tolerated the  procedure without apparent complication.                                               Duke Salvia, M.D.    SCK/MEDQ  D:  11/13/2003  T:  11/13/2003  Job:  [id number]   cc:   Electrophysiology Laboratory   Madaline Savage, M.D.  [address]  [place]  [place] [address]  Fax: [phone]

## 2010-07-18 NOTE — Assessment & Plan Note (Signed)
[facility]                           ELECTROPHYSIOLOGY OFFICE NOTE   Donald, Bond                        MRN:          [id number]  DATE:12/17/2005                            DOB:          April 30, 1960    [name] was seen today in the clinic on December 17, 2005 for followup of  his Guidant Model [phone] Vitality. Date of implant was November 13, 2003 for  __________ and nonsustained VT.  On interrogation of his device today, its  battery voltage is 2.94 with a charge time of 7.9 seconds.  R waves measure  greater than 25 MV with a ventricular pacing threshold of 0.4 volts at 0.5  Msec and a ventricular lead impedance of 793.  Shock impedance was 51.  There were no episodes since last interrogation.  No changes were made in  his parameters.  He was signed up today for [facility] and will send a  transmission at three, six and nine months with a return office visit in one  years.      ______________________________  Altha Harm, LPN    ______________________________  Duke Salvia, MD, [facility]    PO/MedQ  DD:  12/17/2005  DT:  12/19/2005  Job #:  [id number]

## 2010-07-18 NOTE — Discharge Summary (Signed)
   Donald Bond, Donald Bond                           ACCOUNT NO.:  [id number]   MEDICAL RECORD NO.:  [id number]                   PATIENT TYPE:  INP   LOCATION:  2029                                 FACILITY:  [facility]   PHYSICIAN:  Cristy Hilts. Jacinto Halim, M.D.                  DATE OF BIRTH:  1960-04-25   DATE OF ADMISSION:  11/14/2001  DATE OF DISCHARGE:  11/17/2001                                 DISCHARGE SUMMARY   ADDENDUM:  The patient will arrange a followup CT scan of his chest through  his primary care physician, Dr. Leonette Most Record in [place].  I gave the  patient a copy of his CT report.     Abelino Derrick, P.A.                      Cristy Hilts. Jacinto Halim, M.D.    Lenard Lance  D:  11/17/2001  T:  11/19/2001  Job:  [id number]   cc:   Leonette Most Record, M.D.  [address]  [place], [place] [address]

## 2010-07-18 NOTE — Discharge Summary (Signed)
Donald Bond, Donald Bond                           ACCOUNT NO.:  [id number]   MEDICAL RECORD NO.:  [id number]                   PATIENT TYPE:  INP   LOCATION:  2029                                 FACILITY:  [facility]   PHYSICIAN:  Cristy Hilts. Jacinto Halim, M.D.                  DATE OF BIRTH:  09/05/1960   DATE OF ADMISSION:  11/14/2001  DATE OF DISCHARGE:  11/17/2001                                 DISCHARGE SUMMARY   DISCHARGE DIAGNOSES:  1. Cholecystitis, status post laparoscopic cholecystectomy this admission.  2. History of nonobstructive hypertrophic cardiomyopathy with good left     ventricular function.  3. History of Implantable cardioverter-defibrillator for chronic premature     ventricular complexes and nonsustained ventricular tachycardia.  4. Treated hypertension.  5. History of depression.  6. History of normal coronaries.   HOSPITAL COURSE:  The patient is a 50 year old male followed by Dr. Graciela Husbands  and Dr. Elsie Lincoln with history as noted above.  He has nonobstructive  hypertrophic cardiomyopathy with an EF of 60% by echocardiogram November  2002.  He has had diastolic dysfunction.  He had an ICD in 2000 for chronic  PVCs and nonsustained ventricular tachycardia.  Catheterization in June 2000  revealed patent coronaries.  His last Cardiolite study was 09/21/2001 showing  an EF of 50%.  He was admitted 11/15/2001 with right upper quadrant pain and  nausea and vomiting.  He was admitted by Dr. Jacinto Halim.  CK-MBs were negative.  EKG was without acute changes.  He was treated for unstable angina in the  emergency room.  After Dr. Jacinto Halim saw him, his heparin was discontinued.  Amylase and lipase were normal.  Dr. Jacinto Halim felt that his symptoms were  consistent with cholecystitis.   The patient was seen in consult by Dr. Maryagnes Amos and set up for elective urgent  laparoscopic cholecystectomy.  His ICD was programmed to VOO mode  preoperatively and changed back postoperatively.  Apparently he has a  Guidant ICD.   DISCHARGE MEDICATIONS:  He is felt to be stable for discharge 11/17/2001.  He  will resume his home medications including:  1. Toprol XL 50 mg a day.  2. Wellbutrin 50 mg b.i.d.  3. Verelan 100 mg h.s.  4. Celexa 20 mg a day.  5. He was given a prescription for Vicodin per the surgeons for     postoperative pain.   FOLLOW UP:  He will follow up with Dr. Elsie Lincoln and Dr. Graciela Husbands as needed.   LABORATORY DATA:  Sodium138, potassium 3.4, BUN 10, creatinine 1.0. SGOT and  SGPT were normal.  CK-MB and troponin were negative.  INR 0.9,  White count  13.1 on admission with a hemoglobin of 14.5, hematocrit 42.4, platelets 188.  Amylase was 62, lipase 20.   CT scan of the chest done on admission revealed minimal right lower lobe  atelectasis and a 5 mm right  middle lobe nodule.  A followup CT in three  months is recommended.  There were multiple gallstones in the gallbladder.   Ultrasound confirmed gallstones and biliary sludge.   EKG reveals sinus rhythm with LVH and early _____________ changes.   DISPOSITION:  The patient is discharged in stable condition.  A followup fin  section CT is recommended in three months to check this right middle lobe  nodule.  This will need to be arranged as an outpatient.  The patient will  follow up with Dr. Maryagnes Amos and Dr. Elsie Lincoln.      Abelino Derrick, P.A.                      Cristy Hilts. Jacinto Halim, M.D.    Lenard Lance  D:  11/17/2001  T:  11/19/2001  Job:  [id number]   cc:   Madaline Savage, M.D.  [address]  [place]  [place] [address]  Fax: [phone]   Duke Salvia, M.D. [facility]   Gita Kudo, M.D.  Fax: [phone]

## 2010-07-18 NOTE — Discharge Summary (Signed)
[place]  Patient:    KORD, MONETTE Visit Number: [id number] MRN: [id number]          Service Type: MED Location: [phone] Attending Physician:  Silvestre Mesi Dictated by:   Raymon Mutton, P.A. Admit Date:  09/07/2001 Discharge Date: 09/08/2001   CC:         Madaline Savage, M.D.   Discharge Summary  DISCHARGE DIAGNOSES: 1. Chest pain, etiology undetermined; ruled out for myocardial infarction by    serial enzymes and absence of EKG changes. 2. Nonobstructive hypertrophic cardiomyopathy with ejection fraction of 60%. 3. Left ventricular diastolic relaxation dysfunction. 4. Hypertension controlled.  HISTORY OF PRESENT ILLNESS:  The patient is a 50 year old Caucasian gentleman, patient of Dr. Elsie Lincoln, presented to the emergency room at [facility] with complaints of chest pain and shortness of breath and pain radiating to the base of his neck.  The patient said the rest pain occurred after exertion and he was concerned and arrived for an evaluation.  At the emergency room he was given nitroglycerin sublingual which relieved pain but he was admitted per rule out MI protocol.  HOSPITAL COURSE:  The patient was admitted to the telemetry unit and remained stable throughout his admission.  His vital signs were a blood pressure of 122/70, temperature 97.7, respirations 20, pulse 60 and oxygen saturations 96% at room air.  DIAGNOSTIC STUDIES:  Chest x-ray did not show any acute disease.  EKG did not show any acute ST or T wave changes, normal sinus rhythm, with left ventricular hypertrophy.  LABORATORY DATA:  Three sets of cardiac enzymes within normal limits. Hemoglobin 14.8, hematocrit 43.6, platelets 175 and white blood cell count 9.6.  Sodium 140, potassium 3.5, chloride 108, CO2 35, BUN 11 and creatinine 1.2, glucose 104.  Liver function panel was normal.  HOSPITAL CONSULTATIONS:  None.  CONDITION ON  DISCHARGE:  The patient was seen and evaluated by Dr. Domingo Sep. He remained stable from cardiovascular standpoint, pain-free, and discharged to home improved pain-free.  DISCHARGE MEDICATIONS: 1. Verelan PM 100 mg daily. 2. Toprol XL 50 mg daily.  DISCHARGE DIET:  Low salt, low cholesterol diet.  DISCHARGE INSTRUCTIONS:  No exertional activity until his followup in the office.  DISCHARGE FOLLOWUP:  On August 20, 2001 he was scheduled a rest-stress Cardiolite test and on October 03, 2001 he will be seen by Dr. Elsie Lincoln in the office.  PRESCRIPTIONS:  Prescriptions for Verelan and Toprol XL given. Dictated by:   Raymon Mutton, P.A. Attending Physician:  Silvestre Mesi DD:  09/08/01 TD:  09/12/01 Job: [id number]

## 2010-07-22 ENCOUNTER — Ambulatory Visit (INDEPENDENT_AMBULATORY_CARE_PROVIDER_SITE_OTHER): Payer: BC Managed Care – PPO | Admitting: *Deleted

## 2010-07-22 ENCOUNTER — Other Ambulatory Visit: Payer: Self-pay

## 2010-07-22 DIAGNOSIS — I421 Obstructive hypertrophic cardiomyopathy: Secondary | ICD-10-CM

## 2010-07-22 DIAGNOSIS — R55 Syncope and collapse: Secondary | ICD-10-CM

## 2010-08-07 ENCOUNTER — Encounter: Payer: Self-pay | Admitting: *Deleted

## 2010-09-04 NOTE — Progress Notes (Signed)
ICD remote 

## 2010-10-23 ENCOUNTER — Encounter: Payer: BC Managed Care – PPO | Admitting: *Deleted

## 2010-10-30 ENCOUNTER — Encounter: Payer: Self-pay | Admitting: *Deleted

## 2011-01-30 ENCOUNTER — Encounter: Payer: BC Managed Care – PPO | Admitting: Internal Medicine

## 2011-03-12 ENCOUNTER — Encounter: Payer: Self-pay | Admitting: *Deleted

## 2011-03-12 ENCOUNTER — Encounter: Payer: Self-pay | Admitting: Internal Medicine

## 2011-03-13 ENCOUNTER — Encounter: Payer: Self-pay | Admitting: Internal Medicine

## 2011-03-13 ENCOUNTER — Ambulatory Visit (INDEPENDENT_AMBULATORY_CARE_PROVIDER_SITE_OTHER): Payer: 59 | Admitting: Internal Medicine

## 2011-03-13 DIAGNOSIS — Z95810 Presence of automatic (implantable) cardiac defibrillator: Secondary | ICD-10-CM

## 2011-03-13 DIAGNOSIS — R55 Syncope and collapse: Secondary | ICD-10-CM

## 2011-03-13 DIAGNOSIS — I421 Obstructive hypertrophic cardiomyopathy: Secondary | ICD-10-CM

## 2011-03-13 LAB — ICD DEVICE OBSERVATION
BATTERY VOLTAGE: 3.0982 V
BRDY-0002RV: 40 {beats}/min
CHARGE TIME: 11.5 s
DEV-0020ICD: NEGATIVE
DEVICE MODEL ICD: 557550
FVT: 0
HV IMPEDENCE: 53 Ohm
PACEART VT: 0
RV LEAD AMPLITUDE: 11.7 mv
RV LEAD IMPEDENCE ICD: 662.5 Ohm
RV LEAD THRESHOLD: 0.75 V
TOT-0007: 1
TOT-0008: 0
TOT-0009: 1
TOT-0010: 14
TZAT-0001FASTVT: 1
TZAT-0004FASTVT: 8
TZAT-0012FASTVT: 200 ms
TZAT-0013FASTVT: 1
TZAT-0018FASTVT: NEGATIVE
TZAT-0019FASTVT: 7.5 V
TZAT-0020FASTVT: 1 ms
TZON-0003FASTVT: 280 ms
TZON-0003SLOWVT: 375 ms
TZON-0004FASTVT: 24
TZON-0004SLOWVT: 30
TZON-0005FASTVT: 6
TZON-0005SLOWVT: 6
TZON-0010FASTVT: 80 ms
TZON-0010SLOWVT: 80 ms
TZST-0001FASTVT: 2
TZST-0001FASTVT: 3
TZST-0001FASTVT: 4
TZST-0001FASTVT: 5
TZST-0003FASTVT: 36 J
TZST-0003FASTVT: 36 J
TZST-0003FASTVT: 36 J
TZST-0003FASTVT: 36 J
VENTRICULAR PACING ICD: 0.02 pct
VF: 3

## 2011-03-13 NOTE — Assessment & Plan Note (Signed)
Clinically stable.  i think his orthostatic lightheadedness is aggravated by his hypertrophic cardiomyopathy.  I agree with the recommendation from pediatric cardiology service at [facility] the genetic screening is appropriate. We'll undertake that today.

## 2011-03-13 NOTE — Patient Instructions (Addendum)
Your physician recommends that you have lab work today: Gene DX- this usually takes about 8 weeks to come back. We will notify you of the results once they are received and Dr. Graciela Husbands reviews them.  Your physician recommends that you continue on your current medications as directed. Please refer to the Current Medication list given to you today.  Remote monitoring is used to monitor your Pacemaker of ICD from home. This monitoring reduces the number of office visits required to check your device to one time per year. It allows [place] to keep an eye on the functioning of your device to ensure it is working properly. You are scheduled for a device check from home on 06/11/11. You may send your transmission at any time that day. If you have a wireless device, the transmission will be sent automatically. After your physician reviews your transmission, you will receive a postcard with your next transmission date.  Your physician wants you to follow-up in: 6 months with Dr. Graciela Husbands. You will receive a reminder letter in the mail two months in advance. If you don't receive a letter, please call our office to schedule the follow-up appointment.

## 2011-03-13 NOTE — Assessment & Plan Note (Signed)
The patient's device was interrogated.  The information was reviewed. No changes were made in the programming.    

## 2011-03-13 NOTE — Progress Notes (Signed)
  HPI  Donald Bond is a 51 y.o. male Followup for syncope in the setting of hypertrophic obstructive cardiomyopathy. He has had problems persistently with orthostatic lightheadedness.  His sons were evaluated at [facility] for the familial diagnosis. One of his sons has a murmur. It is recommended that we undertake genetic screening  Past Medical History  Diagnosis Date  . SYNCOPE   . Hypertrophic obstructive cardiomyopathy   . TIREDNESS   . CHEST PAIN UNSPECIFIED   . AUTOMATIC IMPLANTABLE CARDIAC DEFIBRILLATOR SITU     Past Surgical History  Procedure Date  . Guidant vitality icd impantation--guidant vitality t135--11/13/2003     Current Outpatient Prescriptions  Medication Sig Dispense Refill  . acetaminophen (TYLENOL) 500 MG tablet Take 500 mg by mouth as needed.      Marland Kitchen atenolol (TENORMIN) 50 MG tablet Take 50 mg by mouth daily.      Marland Kitchen ibuprofen (ADVIL,MOTRIN) 200 MG tablet Take 400 mg by mouth as needed.      . loratadine (CLARITIN) 10 MG tablet Take 10 mg by mouth daily.        No Known Allergies  Review of Systems negative except from HPI and PMH  Physical Exam BP 114/78  Pulse 59  Ht 5\' 7"  (1.702 m)  Wt 196 lb (88.905 kg)  BMI 30.70 kg/m2 Well developed and well nourished in no acute distress HENT normal E scleral and icterus clear Neck Supple JVP flat; carotids brisk and full Clear to ausculation Regular rate and rhythm, 2/6 murmur which increases with Valsalva Soft with active bowel sounds No clubbing cyanosis none Edema Alert and oriented, grossly normal motor and sensory function Skin Warm and Dry   Assessment and  Plan

## 2011-03-19 ENCOUNTER — Other Ambulatory Visit: Payer: Self-pay | Admitting: Internal Medicine

## 2011-05-26 ENCOUNTER — Telehealth: Payer: Self-pay | Admitting: *Deleted

## 2011-05-26 NOTE — Telephone Encounter (Signed)
I spoke with a Gene DX rep regarding the patient's blood sample that was sent out on 03/13/11. Per the rep, the sample was received on 03/14/11 and the results should be back by the end of the week.

## 2011-06-01 ENCOUNTER — Encounter: Payer: Self-pay | Admitting: Internal Medicine

## 2011-06-04 ENCOUNTER — Encounter: Payer: Self-pay | Admitting: Internal Medicine

## 2011-06-04 ENCOUNTER — Ambulatory Visit (INDEPENDENT_AMBULATORY_CARE_PROVIDER_SITE_OTHER): Payer: 59 | Admitting: Internal Medicine

## 2011-06-04 VITALS — BP 120/74 | HR 66 | Ht 67.0 in | Wt 194.4 lb

## 2011-06-04 DIAGNOSIS — I4729 Other ventricular tachycardia: Secondary | ICD-10-CM

## 2011-06-04 DIAGNOSIS — I472 Ventricular tachycardia: Secondary | ICD-10-CM

## 2011-06-04 DIAGNOSIS — I4901 Ventricular fibrillation: Secondary | ICD-10-CM

## 2011-06-04 DIAGNOSIS — Z95810 Presence of automatic (implantable) cardiac defibrillator: Secondary | ICD-10-CM

## 2011-06-04 HISTORY — DX: Ventricular fibrillation: I49.01

## 2011-06-04 LAB — ICD DEVICE OBSERVATION
BATTERY VOLTAGE: 3.008 V
BRDY-0002RV: 40 {beats}/min
DEV-0020ICD: NEGATIVE
DEVICE MODEL ICD: 557550
FVT: 0
HV IMPEDENCE: 53 Ohm
PACEART VT: 0
RV LEAD AMPLITUDE: 11.7 mv
RV LEAD IMPEDENCE ICD: 587.5 Ohm
RV LEAD THRESHOLD: 0.75 V
TOT-0007: 1
TOT-0008: 0
TOT-0009: 1
TOT-0010: 16
TZAT-0001FASTVT: 1
TZAT-0004FASTVT: 8
TZAT-0012FASTVT: 200 ms
TZAT-0013FASTVT: 1
TZAT-0018FASTVT: NEGATIVE
TZAT-0019FASTVT: 7.5 V
TZAT-0020FASTVT: 1 ms
TZON-0003FASTVT: 280 ms
TZON-0003SLOWVT: 375 ms
TZON-0004FASTVT: 24
TZON-0004SLOWVT: 30
TZON-0005FASTVT: 6
TZON-0005SLOWVT: 6
TZON-0010FASTVT: 80 ms
TZON-0010SLOWVT: 80 ms
TZST-0001FASTVT: 2
TZST-0001FASTVT: 3
TZST-0001FASTVT: 4
TZST-0001FASTVT: 5
TZST-0003FASTVT: 36 J
TZST-0003FASTVT: 36 J
TZST-0003FASTVT: 36 J
TZST-0003FASTVT: 36 J
VENTRICULAR PACING ICD: 0.15 pct
VF: 6

## 2011-06-04 LAB — BASIC METABOLIC PANEL
BUN: 13 mg/dL (ref 6–23)
CO2: 26 mEq/L (ref 19–32)
Calcium: 9.4 mg/dL (ref 8.4–10.5)
Chloride: 106 mEq/L (ref 96–112)
Creatinine, Ser: 0.8 mg/dL (ref 0.4–1.5)
GFR: 113.52 mL/min (ref >60.00)
Glucose, Bld: 84 mg/dL (ref 70–99)
Potassium: 3.8 mEq/L (ref 3.5–5.1)
Sodium: 140 mEq/L (ref 135–145)

## 2011-06-04 LAB — MAGNESIUM: Magnesium: 2.2 mg/dL (ref 1.5–2.5)

## 2011-06-04 NOTE — Assessment & Plan Note (Signed)
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

## 2011-06-04 NOTE — Patient Instructions (Signed)
Your physician recommends that you schedule a follow-up appointment in: July 2013 with Dr. Graciela Husbands.  Your physician recommends that you have lab work today: bmp/magnesium  Your physician recommends that you continue on your current medications as directed. Please refer to the Current Medication list given to you today.

## 2011-06-04 NOTE — Assessment & Plan Note (Signed)
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 Will check potassium and magnesium levels today. He is instructed regarding driving.

## 2011-06-04 NOTE — Assessment & Plan Note (Signed)
The patient's device was interrogated.  The information was reviewed. No changes were made in the programming.    

## 2011-06-04 NOTE — Progress Notes (Signed)
  HPI  Donald Bond is a 51 y.o. male Followup for syncope in the setting of hypertrophic obstructive cardiomyopathy. He has had problems persistently with orthostatic lightheadedness.  His sons were evaluated at [facility] for the familial diagnosis. One of his sons has a murmur.  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 SYNCOPE   . Hypertrophic obstructive cardiomyopathy   . TIREDNESS   . CHEST PAIN UNSPECIFIED   . ICD--[employer]   . Orthostatic lightheadedness     Past Surgical History  Procedure Date  . Guidant vitality icd impantation--guidant vitality t135--11/13/2003     Current Outpatient Prescriptions  Medication Sig Dispense Refill  . acetaminophen (TYLENOL) 500 MG tablet Take 500 mg by mouth as needed.      Marland Kitchen atenolol (TENORMIN) 50 MG tablet take 1 tablet by mouth once daily  30 tablet  11  . ibuprofen (ADVIL,MOTRIN) 200 MG tablet Take 400 mg by mouth as needed.      . loratadine (CLARITIN) 10 MG tablet Take 10 mg by mouth daily.        No Known Allergies  Review of Systems negative except from HPI and PMH  Physical Exam BP 120/74  Ht 5\' 7"  (1.702 m)  Wt 194 lb 6.4 oz (88.179 kg)  BMI 30.45 kg/m2 Well developed and well nourished in no acute distress HENT normal E scleral and icterus clear Neck Supple JVP flat; carotids brisk and full Clear to ausculation Regular rate and rhythm, 2/6 murmur which increases with Valsalva Soft with active bowel sounds No clubbing cyanosis none Edema Alert and oriented, grossly normal motor and sensory function Skin Warm and Dry   Assessment and  Plan

## 2011-09-14 ENCOUNTER — Encounter: Payer: 59 | Admitting: Internal Medicine

## 2011-09-21 ENCOUNTER — Encounter: Payer: Self-pay | Admitting: Internal Medicine

## 2011-09-21 ENCOUNTER — Encounter: Payer: Self-pay | Admitting: Cardiology

## 2011-09-21 ENCOUNTER — Ambulatory Visit (INDEPENDENT_AMBULATORY_CARE_PROVIDER_SITE_OTHER): Payer: 59 | Admitting: Cardiology

## 2011-09-21 VITALS — BP 102/64 | HR 69 | Ht 67.0 in | Wt 200.0 lb

## 2011-09-21 DIAGNOSIS — I4901 Ventricular fibrillation: Secondary | ICD-10-CM

## 2011-09-21 DIAGNOSIS — Z95810 Presence of automatic (implantable) cardiac defibrillator: Secondary | ICD-10-CM

## 2011-09-21 LAB — ICD DEVICE OBSERVATION
BATTERY VOLTAGE: 2.98 V
BRDY-0002RV: 40 {beats}/min
DEV-0020ICD: NEGATIVE
DEVICE MODEL ICD: 557550
HV IMPEDENCE: 55 Ohm
RV LEAD AMPLITUDE: 11.7 mv
RV LEAD IMPEDENCE ICD: 690 Ohm
RV LEAD THRESHOLD: 0.75 V
TZAT-0001FASTVT: 1
TZAT-0004FASTVT: 8
TZAT-0012FASTVT: 200 ms
TZAT-0013FASTVT: 1
TZAT-0018FASTVT: NEGATIVE
TZAT-0019FASTVT: 7.5 V
TZAT-0020FASTVT: 1 ms
TZON-0003FASTVT: 280 ms
TZON-0003SLOWVT: 375 ms
TZON-0004FASTVT: 24
TZON-0004SLOWVT: 30
TZON-0005FASTVT: 6
TZON-0005SLOWVT: 6
TZON-0010FASTVT: 80 ms
TZON-0010SLOWVT: 80 ms
TZST-0001FASTVT: 2
TZST-0001FASTVT: 3
TZST-0001FASTVT: 4
TZST-0001FASTVT: 5
TZST-0003FASTVT: 36 J
TZST-0003FASTVT: 36 J
TZST-0003FASTVT: 36 J
TZST-0003FASTVT: 36 J
VENTRICULAR PACING ICD: 1 pct

## 2011-09-21 NOTE — Patient Instructions (Signed)
Remote monitoring is used to monitor your Pacemaker of ICD from home. This monitoring reduces the number of office visits required to check your device to one time per year. It allows [place] to keep an eye on the functioning of your device to ensure it is working properly. You are scheduled for a device check from home on December 21, 2011. You may send your transmission at any time that day. If you have a wireless device, the transmission will be sent automatically. After your physician reviews your transmission, you will receive a postcard with your next transmission date.

## 2011-09-22 ENCOUNTER — Encounter: Payer: Self-pay | Admitting: Cardiology

## 2011-09-22 NOTE — Progress Notes (Signed)
   ELECTROPHYSIOLOGY CONSULT NOTE  Patient ID: Donald Bond MRN: [id number], DOB/AGE: 1961/02/08   Admit date: (Not on file) Date of Consult: 09/22/2011  Primary Physician: Leonette Most Record, MD Primary Cardiologist: Sherryl Manges, MD Reason for Consultation: Device follow-up  History of Present Illness Donald Bond is a pleasant 51 year old gentleman with HOCM who presents today for device follow-up. He has no complaints. He denies chest pain , SOB, palpitations, near syncope or syncope. Of note, he has had polymorphic VT, associated with a long-short coupling sequence, with appropriate ICD therapy in the past. His family is also in the process of undergoing genetic counseling.  Past Medical History  Diagnosis Date  . SYNCOPE   . Hypertrophic obstructive cardiomyopathy   . CHEST PAIN UNSPECIFIED   . ICD--St Jude, single chamber   . Orthostatic lightheadedness     Allergies/Intolerances No Known Allergies  Home Medications Current Outpatient Prescriptions  Medication Sig Dispense Refill  . acetaminophen (TYLENOL) 500 MG tablet Take 500 mg by mouth as needed.      Marland Kitchen atenolol (TENORMIN) 50 MG tablet take 1 tablet by mouth once daily  30 tablet  11  . ibuprofen (ADVIL,MOTRIN) 200 MG tablet Take 400 mg by mouth as needed.      . loratadine (CLARITIN) 10 MG tablet Take 10 mg by mouth daily.       Social History Social History  . Marital Status: Married   Social History Main Topics  . Smoking status: Never Smoker   . Smokeless tobacco: Never Used  . Alcohol Use: Yes     occasional   . Drug Use: No   Review of Systems General:  No chills, fever, night sweats or weight changes  Cardiovascular:  No chest pain, dyspnea on exertion, edema, orthopnea, palpitations, paroxysmal nocturnal dyspnea Dermatological: No rash, lesions or masses Respiratory: No cough, dyspnea Urologic: No hematuria, dysuria Abdominal:   No nausea, vomiting, diarrhea, bright red blood per rectum, melena, or  hematemesis Neurologic:  No visual changes, weakness, changes in mental status All other systems reviewed and are otherwise negative except as noted above.  Physical Exam Blood pressure 102/64, pulse 69, height 5\' 7"  (1.702 m), weight 200 lb (90.719 kg).  General: Well developed, well appearing 51 year old male in no acute distress. HEENT: Normocephalic, atraumatic. EOMs intact. Sclera nonicteric. Oropharynx clear.  Neck: Supple. No JVD. Lungs:  Respirations regular and unlabored, CTA bilaterally. No wheezes,rales or rhochi. Heart: RRR. S1, S2 present. No murmurs, rub, S3 or S4. Abdomen: Soft, non-tender, non-distended. BS present x 4 quadrants. No hepatosplenomegaly.  Extremities: No clubbing, cyanosis or edema. DP/PT/Radials 2+ and equal bilaterally. Psych: Normal affect. Neuro: Alert and oriented X 3. Moves all extremities spontaneously.   Device interrogaton shows normal single chamber ICD function with good battery status and stable lead measurements/parameters; there were no programming changes noted; see PaceArt report  Assessment and Plan 1. HOCM with prior VT/VF episodes successfully treated with ICD therapy - stable; Donald Bond device is functioning normally and there were no programming changes made today; he will continue remote follow-up every 3 months and follow-up with Dr. Graciela Husbands as scheduled in April 2014  Signed, Rick Duff, PA-C 09/22/2011, 3:11 PM

## 2011-12-21 ENCOUNTER — Encounter: Payer: 59 | Admitting: *Deleted

## 2012-03-01 ENCOUNTER — Encounter: Payer: Self-pay | Admitting: *Deleted

## 2012-03-11 ENCOUNTER — Encounter: Payer: 59 | Admitting: Cardiology

## 2012-03-22 ENCOUNTER — Ambulatory Visit (INDEPENDENT_AMBULATORY_CARE_PROVIDER_SITE_OTHER): Payer: 59 | Admitting: *Deleted

## 2012-03-22 ENCOUNTER — Ambulatory Visit: Payer: 59 | Admitting: Cardiology

## 2012-03-22 ENCOUNTER — Encounter: Payer: Self-pay | Admitting: Cardiology

## 2012-03-22 VITALS — BP 141/87 | HR 71 | Ht 67.0 in | Wt 201.0 lb

## 2012-03-22 DIAGNOSIS — I4901 Ventricular fibrillation: Secondary | ICD-10-CM

## 2012-03-22 MED ORDER — ATENOLOL 50 MG PO TABS: 50.0000 mg | ORAL_TABLET | Freq: Every day | ORAL | Status: DC

## 2012-03-22 NOTE — Progress Notes (Signed)
ICD check/device clinic only. See PaceArt report.  

## 2012-03-22 NOTE — Progress Notes (Signed)
ICD check 

## 2012-04-01 ENCOUNTER — Encounter: Payer: Self-pay | Admitting: Internal Medicine

## 2012-04-04 ENCOUNTER — Encounter: Payer: Self-pay | Admitting: Internal Medicine

## 2012-04-15 LAB — ICD DEVICE OBSERVATION
BATTERY VOLTAGE: 2.8726 V
BRDY-0002RV: 40 {beats}/min
DEV-0020ICD: NEGATIVE
DEVICE MODEL ICD: 557550
FVT: 0
HV IMPEDENCE: 62 Ohm
PACEART VT: 0
RV LEAD AMPLITUDE: 11.7 mv
RV LEAD IMPEDENCE ICD: 687.5 Ohm
RV LEAD THRESHOLD: 0.75 V
TOT-0007: 1
TOT-0008: 0
TOT-0009: 1
TOT-0010: 19
TZAT-0001FASTVT: 1
TZAT-0004FASTVT: 8
TZAT-0012FASTVT: 200 ms
TZAT-0013FASTVT: 1
TZAT-0018FASTVT: NEGATIVE
TZAT-0019FASTVT: 7.5 V
TZAT-0020FASTVT: 1 ms
TZON-0003FASTVT: 280 ms
TZON-0003SLOWVT: 375 ms
TZON-0004FASTVT: 24
TZON-0004SLOWVT: 30
TZON-0005FASTVT: 6
TZON-0005SLOWVT: 6
TZON-0010FASTVT: 80 ms
TZON-0010SLOWVT: 80 ms
TZST-0001FASTVT: 2
TZST-0001FASTVT: 3
TZST-0001FASTVT: 4
TZST-0001FASTVT: 5
TZST-0003FASTVT: 36 J
TZST-0003FASTVT: 36 J
TZST-0003FASTVT: 36 J
TZST-0003FASTVT: 36 J
VENTRICULAR PACING ICD: 0.01 pct
VF: 0

## 2012-06-01 ENCOUNTER — Encounter: Payer: Self-pay | Admitting: *Deleted

## 2012-06-02 ENCOUNTER — Encounter: Payer: Self-pay | Admitting: Cardiovascular Disease

## 2012-06-24 ENCOUNTER — Encounter: Payer: Self-pay | Admitting: Internal Medicine

## 2012-06-24 ENCOUNTER — Encounter: Payer: Self-pay | Admitting: Cardiology

## 2012-06-24 ENCOUNTER — Ambulatory Visit (INDEPENDENT_AMBULATORY_CARE_PROVIDER_SITE_OTHER): Payer: 59 | Admitting: Cardiology

## 2012-06-24 DIAGNOSIS — I4901 Ventricular fibrillation: Secondary | ICD-10-CM

## 2012-06-24 DIAGNOSIS — I421 Obstructive hypertrophic cardiomyopathy: Secondary | ICD-10-CM

## 2012-06-24 DIAGNOSIS — Z95810 Presence of automatic (implantable) cardiac defibrillator: Secondary | ICD-10-CM

## 2012-06-24 LAB — ICD DEVICE OBSERVATION
BATTERY VOLTAGE: 2.74 V
BRDY-0002RV: 40 {beats}/min
DEV-0020ICD: NEGATIVE
DEVICE MODEL ICD: 557550
FVT: 0
HV IMPEDENCE: 54 Ohm
PACEART VT: 0
RV LEAD AMPLITUDE: 11.7 mv
RV LEAD IMPEDENCE ICD: 680 Ohm
RV LEAD THRESHOLD: 0.75 V
TZAT-0001FASTVT: 1
TZAT-0004FASTVT: 8
TZAT-0012FASTVT: 200 ms
TZAT-0013FASTVT: 1
TZAT-0018FASTVT: NEGATIVE
TZAT-0019FASTVT: 7.5 V
TZAT-0020FASTVT: 1 ms
TZON-0003FASTVT: 280 ms
TZON-0003SLOWVT: 375 ms
TZON-0004FASTVT: 24
TZON-0004SLOWVT: 30
TZON-0005FASTVT: 6
TZON-0005SLOWVT: 6
TZON-0010FASTVT: 80 ms
TZON-0010SLOWVT: 80 ms
TZST-0001FASTVT: 2
TZST-0001FASTVT: 3
TZST-0001FASTVT: 4
TZST-0001FASTVT: 5
TZST-0003FASTVT: 36 J
TZST-0003FASTVT: 36 J
TZST-0003FASTVT: 36 J
TZST-0003FASTVT: 36 J
VENTRICULAR PACING ICD: 0 pct
VF: 0

## 2012-06-24 NOTE — Progress Notes (Signed)
ELECTROPHYSIOLOGY OFFICE NOTE  Patient ID: Donald Bond MRN: [id number], DOB/AGE: 1960-11-26   Date of Visit: 06/24/2012  Primary Physician: Linus Orn Primary Cardiologist: Berton Mount, MD Reason for Visit: EP/device follow-up  History of Present Illness  Donald Bond is a pleasant 52 year old gentleman with HOCM who presents today for EP/device follow-up. He has no complaints. He denies chest pain , SOB, palpitations, near syncope or syncope. He denies ICD shocks. Of note, he has had polymorphic VT, associated with a long-short coupling sequence, with appropriate ICD therapy in the past. His family has completed genetic testing and counseling. His youngest son is negative but his oldest son is positive an has now established care with Dr. Ardelle Balls at [facility] for routine follow-up.   Past Medical History Past Medical History  Diagnosis Date  . SYNCOPE   . Hypertrophic obstructive cardiomyopathy   . TIREDNESS   . CHEST PAIN UNSPECIFIED   . ICD--St Judes     Dr. Graciela Husbands  . Orthostatic lightheadedness     Past Surgical History Past Surgical History  Procedure Laterality Date  . Guidant vitality icd impantation--guidant vitality t135--11/13/2003      Allergies/Intolerances No Known Allergies  Current Home Medications Current Outpatient Prescriptions  Medication Sig Dispense Refill  . acetaminophen (TYLENOL) 500 MG tablet Take 500 mg by mouth as needed.      Marland Kitchen atenolol (TENORMIN) 50 MG tablet Take 1 tablet (50 mg total) by mouth daily.  30 tablet  11  . ibuprofen (ADVIL,MOTRIN) 200 MG tablet Take 400 mg by mouth as needed.      . loratadine (CLARITIN) 10 MG tablet Take 10 mg by mouth daily.       No current facility-administered medications for this visit.   Social History Social History  . Marital Status: Married   Social History Main Topics  . Smoking status: Never Smoker   . Smokeless tobacco: Never Used  . Alcohol Use: Yes     Comment: occasional   . Drug Use:  No   Review of Systems General: No chills, fever, night sweats or weight changes Cardiovascular: No chest pain, dyspnea on exertion, edema, orthopnea, palpitations, paroxysmal nocturnal dyspnea Dermatological: No rash, lesions or masses Respiratory: No cough, dyspnea Urologic: No hematuria, dysuria Abdominal: No nausea, vomiting, diarrhea, bright red blood per rectum, melena, or hematemesis Neurologic: No visual changes, weakness, changes in mental status All other systems reviewed and are otherwise negative except as noted above.  Physical Exam Blood pressure 130/80, pulse 69, height 5\' 7"  (1.702 m), weight 195 lb 6.4 oz (88.633 kg), SpO2 99.00%.  General: Well developed, well appearing 52 year old male in no acute distress. HEENT: Normocephalic, atraumatic. EOMs intact. Sclera nonicteric. Oropharynx clear.  Neck: Supple. No JVD. Lungs: Respirations regular and unlabored, CTA bilaterally. No wheezes, rales or rhonchi. Heart: RRR. S1, S2 present. No murmurs, rub, S3 or S4. Abdomen: Soft, non-distended.  Extremities: No clubbing, cyanosis or edema. PT/Radials 2+ and equal bilaterally. Psych: Normal affect. Neuro: Alert and oriented X 3. Moves all extremities spontaneously.   Diagnostics Device interrogation today - Normal device function. Thresholds and sensing consistent with previous device measurements. Impedance trends stable over time. No evidence of any ventricular arrhythmias. Histogram distribution appropriate for patient and level of activity. No changes made this session. Device programmed at appropriate safety margins. Device programmed to optimize intrinsic conduction. Estimated longevity 3.9 years.   Assessment and Plan 1. HOCM with prior VT/VF episodes successfully treated with ICD therapy  Stable. Donald Bond device is functioning normally and there were no programming changes made today. He will continue remote device follow-up every 3 months and return to clinic for  follow-up with Dr. Graciela Husbands in 6 months  Signed, Rick Duff, PA-C 06/24/2012, 11:17 AM

## 2012-06-24 NOTE — Patient Instructions (Signed)
Remote monitoring is used to monitor your Pacemaker of ICD from home. This monitoring reduces the number of office visits required to check your device to one time per year. It allows [place] to keep an eye on the functioning of your device to ensure it is working properly. You are scheduled for a device check from home on 09/23/2012. You may send your transmission at any time that day. If you have a wireless device, the transmission will be sent automatically. After your physician reviews your transmission, you will receive a postcard with your next transmission date.  Your physician wants you to follow-up in: 6 months with Dr. Logan Bores will receive a reminder letter in the mail two months in advance. If you don't receive a letter, please call our office to schedule the follow-up appointment.

## 2012-09-26 ENCOUNTER — Ambulatory Visit (INDEPENDENT_AMBULATORY_CARE_PROVIDER_SITE_OTHER): Payer: 59 | Admitting: *Deleted

## 2012-09-26 DIAGNOSIS — Z95810 Presence of automatic (implantable) cardiac defibrillator: Secondary | ICD-10-CM

## 2012-09-26 DIAGNOSIS — I4901 Ventricular fibrillation: Secondary | ICD-10-CM

## 2012-09-28 LAB — REMOTE ICD DEVICE
BATTERY VOLTAGE: 2.65 V
BRDY-0002RV: 40 {beats}/min
DEV-0020ICD: NEGATIVE
DEVICE MODEL ICD: 557550
HV IMPEDENCE: 54 Ohm
RV LEAD AMPLITUDE: 11.7 mv
RV LEAD IMPEDENCE ICD: 650 Ohm
TZAT-0001FASTVT: 1
TZAT-0004FASTVT: 8
TZAT-0012FASTVT: 200 ms
TZAT-0013FASTVT: 1
TZAT-0018FASTVT: NEGATIVE
TZAT-0019FASTVT: 7.5 V
TZAT-0020FASTVT: 1 ms
TZON-0003FASTVT: 280 ms
TZON-0003SLOWVT: 375 ms
TZON-0004FASTVT: 24
TZON-0004SLOWVT: 30
TZON-0005FASTVT: 6
TZON-0005SLOWVT: 6
TZON-0010FASTVT: 80 ms
TZON-0010SLOWVT: 80 ms
TZST-0001FASTVT: 2
TZST-0001FASTVT: 3
TZST-0001FASTVT: 4
TZST-0001FASTVT: 5
TZST-0003FASTVT: 36 J
TZST-0003FASTVT: 36 J
TZST-0003FASTVT: 36 J
TZST-0003FASTVT: 36 J
VENTRICULAR PACING ICD: 1 pct

## 2012-11-01 ENCOUNTER — Encounter: Payer: Self-pay | Admitting: *Deleted

## 2012-12-06 ENCOUNTER — Encounter: Payer: Self-pay | Admitting: Internal Medicine

## 2012-12-26 ENCOUNTER — Encounter: Payer: Self-pay | Admitting: Internal Medicine

## 2012-12-26 ENCOUNTER — Ambulatory Visit (INDEPENDENT_AMBULATORY_CARE_PROVIDER_SITE_OTHER): Payer: 59 | Admitting: *Deleted

## 2012-12-26 DIAGNOSIS — I4901 Ventricular fibrillation: Secondary | ICD-10-CM

## 2012-12-26 LAB — REMOTE ICD DEVICE
BATTERY VOLTAGE: 2.62 V
BRDY-0002RV: 40 {beats}/min
DEV-0020ICD: NEGATIVE
DEVICE MODEL ICD: 557550
HV IMPEDENCE: 50 Ohm
RV LEAD AMPLITUDE: 11.7 mv
RV LEAD IMPEDENCE ICD: 710 Ohm
TZAT-0001FASTVT: 1
TZAT-0004FASTVT: 8
TZAT-0012FASTVT: 200 ms
TZAT-0013FASTVT: 1
TZAT-0018FASTVT: NEGATIVE
TZAT-0019FASTVT: 7.5 V
TZAT-0020FASTVT: 1 ms
TZON-0003FASTVT: 280 ms
TZON-0003SLOWVT: 375 ms
TZON-0004FASTVT: 24
TZON-0004SLOWVT: 30
TZON-0005FASTVT: 6
TZON-0005SLOWVT: 6
TZON-0010FASTVT: 80 ms
TZON-0010SLOWVT: 80 ms
TZST-0001FASTVT: 2
TZST-0001FASTVT: 3
TZST-0001FASTVT: 4
TZST-0001FASTVT: 5
TZST-0003FASTVT: 36 J
TZST-0003FASTVT: 36 J
TZST-0003FASTVT: 36 J
TZST-0003FASTVT: 36 J
VENTRICULAR PACING ICD: 1 pct

## 2013-01-05 NOTE — Progress Notes (Signed)
Remote defib received  

## 2013-01-06 ENCOUNTER — Encounter: Payer: Self-pay | Admitting: *Deleted

## 2013-03-23 ENCOUNTER — Encounter: Payer: Self-pay | Admitting: Internal Medicine

## 2013-03-23 ENCOUNTER — Ambulatory Visit (INDEPENDENT_AMBULATORY_CARE_PROVIDER_SITE_OTHER): Payer: BC Managed Care – PPO | Admitting: Internal Medicine

## 2013-03-23 VITALS — BP 130/82 | HR 60 | Ht 66.5 in | Wt 183.0 lb

## 2013-03-23 DIAGNOSIS — I4901 Ventricular fibrillation: Secondary | ICD-10-CM

## 2013-03-23 DIAGNOSIS — I421 Obstructive hypertrophic cardiomyopathy: Secondary | ICD-10-CM

## 2013-03-23 DIAGNOSIS — Z95810 Presence of automatic (implantable) cardiac defibrillator: Secondary | ICD-10-CM

## 2013-03-23 LAB — MDC_IDC_ENUM_SESS_TYPE_INCLINIC
Battery Remaining Longevity: 42 mo
Battery Voltage: 2.6 V
Brady Statistic RV Percent Paced: 0.04 %
Date Time Interrogation Session: 20150122120130
HighPow Impedance: 55.8137
Implantable Pulse Generator Serial Number: 557550
Lead Channel Impedance Value: 687.5 Ohm
Lead Channel Pacing Threshold Amplitude: 0.75 V
Lead Channel Pacing Threshold Amplitude: 0.75 V
Lead Channel Pacing Threshold Pulse Width: 0.5 ms
Lead Channel Pacing Threshold Pulse Width: 0.5 ms
Lead Channel Sensing Intrinsic Amplitude: 11.7 mV
Lead Channel Setting Pacing Amplitude: 2.5 V
Lead Channel Setting Pacing Pulse Width: 0.5 ms
Lead Channel Setting Sensing Sensitivity: 0.3 mV
Zone Setting Detection Interval: 250 ms
Zone Setting Detection Interval: 280 ms
Zone Setting Detection Interval: 375 ms

## 2013-03-23 MED ORDER — ATENOLOL 50 MG PO TABS: 50.0000 mg | ORAL_TABLET | Freq: Every day | ORAL | Status: DC

## 2013-03-23 NOTE — Patient Instructions (Signed)
Your physician recommends that you continue on your current medications as directed. Please refer to the Current Medication list given to you today.  Remote monitoring is used to monitor your Pacemaker of ICD from home. This monitoring reduces the number of office visits required to check your device to one time per year. It allows [place] to keep an eye on the functioning of your device to ensure it is working properly. You are scheduled for a device check from home on 06/26/2013. You may send your transmission at any time that day. If you have a wireless device, the transmission will be sent automatically. After your physician reviews your transmission, you will receive a postcard with your next transmission date.  Your physician wants you to follow-up in: 1 year with Rick Duff, PAC.  You will receive a reminder letter in the mail two months in advance. If you don't receive a letter, please call our office to schedule the follow-up appointment.

## 2013-03-23 NOTE — Progress Notes (Signed)
      Patient Care Team: Leonette Most Record as PCP - General   HPI  Donald Bond is a 53 y.o. male Seen in followup for HCM associated with syncope and previously treated ventricular tachycardia he has ICD  He has undergone genetic testing. Younger son is negative older son is positive  Past Medical History  Diagnosis Date  . SYNCOPE   . Hypertrophic obstructive cardiomyopathy   . TIREDNESS   . ICD--St Judes     Dr. Graciela Husbands  . Orthostatic lightheadedness   . Ventricular tachycardia     rx via ICD    Past Surgical History  Procedure Laterality Date  . Guidant vitality icd impantation--guidant vitality t135--11/13/2003      Current Outpatient Prescriptions  Medication Sig Dispense Refill  . acetaminophen (TYLENOL) 500 MG tablet Take 500 mg by mouth as needed.      Marland Kitchen atenolol (TENORMIN) 50 MG tablet Take 1 tablet (50 mg total) by mouth daily.  30 tablet  11  . ibuprofen (ADVIL,MOTRIN) 200 MG tablet Take 400 mg by mouth as needed.      . loratadine (CLARITIN) 10 MG tablet Take 10 mg by mouth daily.       No current facility-administered medications for this visit.    No Known Allergies  Review of Systems negative except from HPI and PMH  Physical Exam BP 130/82  Pulse 60  Ht 5' 6.5" (1.689 m)  Wt 183 lb (83.008 kg)  BMI 29.10 kg/m2 Well developed and nourished in no acute distress HENT normal Neck supple with JVP-flat Clear Regular rate and rhythm, no murmurs or gallops Abd-soft with active BS No Clubbing cyanosis edema Skin-warm and dry A & Oriented  Grossly normal sensory and motor function   Assessment and plan  Hypertrophic cardio myopathy largely asymptomatic we'll continue him on his current medications  Ventricular tachycardia with appropriate therapy No intercurrent Ventricular tachycardia   Status post ICD implantation-St. Jude  The patient's device was interrogated.  The information was reviewed. No changes were made in the programming.

## 2013-04-02 HISTORY — PX: HYDROCELE EXCISION / REPAIR: SUR1145

## 2013-04-20 ENCOUNTER — Telehealth: Payer: Self-pay | Admitting: Internal Medicine

## 2013-04-20 NOTE — Telephone Encounter (Signed)
New message     Talk to paula about his defibulator

## 2013-04-20 NOTE — Telephone Encounter (Signed)
Explained magnet response to patient.  He had a surgical procedure 04/19/13 and wanted to insure that his device did not need to be reprogrammed.

## 2013-06-26 ENCOUNTER — Encounter: Payer: BC Managed Care – PPO | Admitting: *Deleted

## 2013-07-12 ENCOUNTER — Encounter: Payer: Self-pay | Admitting: *Deleted

## 2013-08-15 ENCOUNTER — Encounter: Payer: Self-pay | Admitting: Cardiology

## 2013-08-21 ENCOUNTER — Ambulatory Visit (INDEPENDENT_AMBULATORY_CARE_PROVIDER_SITE_OTHER): Payer: BC Managed Care – PPO | Admitting: *Deleted

## 2013-08-21 ENCOUNTER — Encounter: Payer: Self-pay | Admitting: Internal Medicine

## 2013-08-21 DIAGNOSIS — I4901 Ventricular fibrillation: Secondary | ICD-10-CM

## 2013-08-21 NOTE — Progress Notes (Signed)
Remote ICD transmission.   

## 2013-08-22 ENCOUNTER — Telehealth: Payer: Self-pay | Admitting: Internal Medicine

## 2013-08-22 NOTE — Telephone Encounter (Signed)
New message     Did we get his remote transmission check?

## 2013-08-22 NOTE — Telephone Encounter (Signed)
Informed pt that remote transmission was received on 08-19-2013. Pt verbalized understanding.

## 2013-08-28 LAB — MDC_IDC_ENUM_SESS_TYPE_REMOTE
Battery Remaining Longevity: 52 mo
Battery Voltage: 2.59 V
Brady Statistic RV Percent Paced: 1 %
Date Time Interrogation Session: 20150620191750
HighPow Impedance: 57 Ohm
Implantable Pulse Generator Serial Number: 557550
Lead Channel Impedance Value: 680 Ohm
Lead Channel Pacing Threshold Amplitude: 0.75 V
Lead Channel Pacing Threshold Pulse Width: 0.5 ms
Lead Channel Sensing Intrinsic Amplitude: 11.7 mV
Lead Channel Setting Pacing Amplitude: 2.5 V
Lead Channel Setting Pacing Pulse Width: 0.5 ms
Lead Channel Setting Sensing Sensitivity: 0.3 mV
Zone Setting Detection Interval: 250 ms
Zone Setting Detection Interval: 280 ms
Zone Setting Detection Interval: 375 ms

## 2013-09-05 ENCOUNTER — Encounter: Payer: Self-pay | Admitting: Cardiology

## 2013-09-07 ENCOUNTER — Encounter: Payer: Self-pay | Admitting: Cardiology

## 2013-11-22 ENCOUNTER — Ambulatory Visit (INDEPENDENT_AMBULATORY_CARE_PROVIDER_SITE_OTHER): Payer: BC Managed Care – PPO | Admitting: *Deleted

## 2013-11-22 DIAGNOSIS — I4901 Ventricular fibrillation: Secondary | ICD-10-CM

## 2013-11-22 NOTE — Progress Notes (Signed)
Remote ICD transmission.   

## 2013-11-24 LAB — MDC_IDC_ENUM_SESS_TYPE_REMOTE
Battery Remaining Longevity: 29 mo
Battery Voltage: 2.56 V
Brady Statistic RV Percent Paced: 1 %
Date Time Interrogation Session: 20150923060015
HighPow Impedance: 58 Ohm
Implantable Pulse Generator Serial Number: 557550
Lead Channel Impedance Value: 680 Ohm
Lead Channel Pacing Threshold Amplitude: 0.75 V
Lead Channel Pacing Threshold Pulse Width: 0.5 ms
Lead Channel Sensing Intrinsic Amplitude: 11.7 mV
Lead Channel Setting Pacing Amplitude: 2.5 V
Lead Channel Setting Pacing Pulse Width: 0.5 ms
Lead Channel Setting Sensing Sensitivity: 0.3 mV
Zone Setting Detection Interval: 250 ms
Zone Setting Detection Interval: 280 ms
Zone Setting Detection Interval: 375 ms

## 2013-12-04 ENCOUNTER — Encounter: Payer: Self-pay | Admitting: Cardiology

## 2013-12-07 ENCOUNTER — Encounter: Payer: Self-pay | Admitting: Internal Medicine

## 2013-12-19 ENCOUNTER — Encounter: Payer: Self-pay | Admitting: Cardiology

## 2014-02-21 ENCOUNTER — Ambulatory Visit: Payer: BC Managed Care – PPO

## 2014-02-22 ENCOUNTER — Encounter: Payer: Self-pay | Admitting: Internal Medicine

## 2014-02-22 LAB — MDC_IDC_ENUM_SESS_TYPE_REMOTE
Battery Remaining Longevity: 50 mo
Battery Voltage: 2.57 V
Brady Statistic RV Percent Paced: 1 %
Date Time Interrogation Session: 20151224204825
HighPow Impedance: 58 Ohm
Implantable Pulse Generator Serial Number: 557550
Lead Channel Impedance Value: 690 Ohm
Lead Channel Pacing Threshold Amplitude: 0.75 V
Lead Channel Pacing Threshold Pulse Width: 0.5 ms
Lead Channel Sensing Intrinsic Amplitude: 11.7 mV
Lead Channel Setting Pacing Amplitude: 2.5 V
Lead Channel Setting Pacing Pulse Width: 0.5 ms
Lead Channel Setting Sensing Sensitivity: 0.3 mV
Zone Setting Detection Interval: 250 ms
Zone Setting Detection Interval: 280 ms
Zone Setting Detection Interval: 375 ms

## 2014-02-27 ENCOUNTER — Encounter: Payer: Self-pay | Admitting: Cardiology

## 2014-03-16 ENCOUNTER — Encounter: Payer: Self-pay | Admitting: Cardiology

## 2014-03-25 ENCOUNTER — Encounter: Payer: Self-pay | Admitting: Internal Medicine

## 2014-04-10 ENCOUNTER — Encounter: Payer: Self-pay | Admitting: *Deleted

## 2014-04-20 ENCOUNTER — Other Ambulatory Visit: Payer: Self-pay

## 2014-04-20 DIAGNOSIS — I4901 Ventricular fibrillation: Secondary | ICD-10-CM

## 2014-04-20 MED ORDER — ATENOLOL 50 MG PO TABS: 50.0000 mg | ORAL_TABLET | Freq: Every day | ORAL | Status: DC

## 2014-04-23 ENCOUNTER — Other Ambulatory Visit: Payer: Self-pay | Admitting: *Deleted

## 2014-04-23 DIAGNOSIS — I4901 Ventricular fibrillation: Secondary | ICD-10-CM

## 2014-04-23 MED ORDER — ATENOLOL 50 MG PO TABS: 50.0000 mg | ORAL_TABLET | Freq: Every day | ORAL | Status: DC

## 2014-06-04 ENCOUNTER — Encounter: Payer: Self-pay | Admitting: Internal Medicine

## 2014-06-04 ENCOUNTER — Ambulatory Visit (INDEPENDENT_AMBULATORY_CARE_PROVIDER_SITE_OTHER): Payer: BLUE CROSS/BLUE SHIELD | Admitting: Internal Medicine

## 2014-06-04 VITALS — BP 134/86 | HR 57 | Ht 67.0 in | Wt 191.2 lb

## 2014-06-04 DIAGNOSIS — Z4502 Encounter for adjustment and management of automatic implantable cardiac defibrillator: Secondary | ICD-10-CM | POA: Diagnosis not present

## 2014-06-04 DIAGNOSIS — I4901 Ventricular fibrillation: Secondary | ICD-10-CM

## 2014-06-04 LAB — MDC_IDC_ENUM_SESS_TYPE_INCLINIC
Battery Remaining Longevity: 28.8 mo
Battery Voltage: 2.56 V
Brady Statistic RV Percent Paced: 0.12 %
Date Time Interrogation Session: 20160404135324
HighPow Impedance: 54.1463
Implantable Pulse Generator Serial Number: 557550
Lead Channel Impedance Value: 675 Ohm
Lead Channel Pacing Threshold Amplitude: 0.75 V
Lead Channel Pacing Threshold Pulse Width: 0.5 ms
Lead Channel Sensing Intrinsic Amplitude: 11.7 mV
Lead Channel Setting Pacing Amplitude: 2.5 V
Lead Channel Setting Pacing Pulse Width: 0.5 ms
Lead Channel Setting Sensing Sensitivity: 0.3 mV
Zone Setting Detection Interval: 250 ms
Zone Setting Detection Interval: 280 ms
Zone Setting Detection Interval: 375 ms

## 2014-06-04 NOTE — Patient Instructions (Signed)
Your physician recommends that you continue on your current medications as directed. Please refer to the Current Medication list given to you today.  Your physician has recommended that you have a home sleep study.  [employer] (sleep study) will contact you arrange this.  Remote monitoring is used to monitor your Pacemaker of ICD from home. This monitoring reduces the number of office visits required to check your device to one time per year. It allows [place] to keep an eye on the functioning of your device to ensure it is working properly. You are scheduled for a device check from home on 09/04/14. You may send your transmission at any time that day. If you have a wireless device, the transmission will be sent automatically. After your physician reviews your transmission, you will receive a postcard with your next transmission date.  Your physician wants you to follow-up in: 1 year with Donald Balsam, NP.  You will receive a reminder letter in the mail two months in advance. If you don't receive a letter, please call our office to schedule the follow-up appointment.

## 2014-06-04 NOTE — Progress Notes (Signed)
      Patient Care Team: Leonette Most Record as PCP - General   HPI  Donald Bond is a 54 y.o. male Seen in followup for HCM associated with syncope and previously treated ventricular tachycardia he has ICD  The patient denies chest pain, shortness of breath, nocturnal dyspnea, orthopnea or peripheral edema.  There have been no palpitations, lightheadedness or syncope.    He has undergone genetic testing. Younger son is negative;  older son is positive  Past Medical History  Diagnosis Date  . SYNCOPE   . Hypertrophic obstructive cardiomyopathy   . TIREDNESS   . ICD--St Judes     Dr. Graciela Husbands  . Orthostatic lightheadedness   . Ventricular tachycardia     rx via ICD    Past Surgical History  Procedure Laterality Date  . Guidant vitality icd impantation--guidant vitality t135--11/13/2003      Current Outpatient Prescriptions  Medication Sig Dispense Refill  . acetaminophen (TYLENOL) 500 MG tablet Take 500 mg by mouth as needed.    Marland Kitchen atenolol (TENORMIN) 50 MG tablet Take 1 tablet (50 mg total) by mouth daily. 30 tablet 11  . ibuprofen (ADVIL,MOTRIN) 200 MG tablet Take 400 mg by mouth as needed.    . loratadine (CLARITIN) 10 MG tablet Take 10 mg by mouth daily.     No current facility-administered medications for this visit.    No Known Allergies  Review of Systems negative except from HPI and PMH  Physical Exam BP 134/86 mmHg  Pulse 57  Ht 5\' 7"  (1.702 m)  Wt 191 lb 3.2 oz (86.728 kg)  BMI 29.94 kg/m2 Well developed and nourished in no acute distress HENT normal Neck supple with JVP-flat Clear Regular rate and rhythm, no murmurs or gallops Abd-soft with active BS No Clubbing cyanosis edema Skin-warm and dry A & Oriented  Grossly normal sensory and motor function   Assessment and plan  Hypertrophic cardio myopathy largely asymptomatic we'll continue him on his current medications  Ventricular tachycardia with appropriate therapy    Sleep disordered  breathing and daytime somnolence  Status post ICD implantation-St. Jude  The patient's device was interrogated.  The information was reviewed. No changes were made in the programming.    Has had intercurrent ventricular tachycardia, terminaitng without therapy  There have also been sinus episodes in the tachy zone  He is sleep disordered breathing and daytime somnolence. I am concerned about the possibility of sleep apnea edema in the context of HCM and is aggravating her tendency towards atrial fibrillation. We will undertake a sleep study.

## 2014-06-05 ENCOUNTER — Encounter: Payer: Self-pay | Admitting: Internal Medicine

## 2014-07-09 ENCOUNTER — Emergency Department (HOSPITAL_COMMUNITY)
Admission: EM | Admit: 2014-07-09 | Discharge: 2014-07-09 | Disposition: A | Discharge status: Home / Self Care | Payer: BLUE CROSS/BLUE SHIELD | Attending: Emergency Medicine | Admitting: Emergency Medicine

## 2014-07-09 ENCOUNTER — Emergency Department (HOSPITAL_COMMUNITY): Payer: BLUE CROSS/BLUE SHIELD

## 2014-07-09 ENCOUNTER — Encounter (HOSPITAL_COMMUNITY): Payer: Self-pay | Admitting: Cardiology

## 2014-07-09 DIAGNOSIS — N200 Calculus of kidney: Secondary | ICD-10-CM | POA: Insufficient documentation

## 2014-07-09 DIAGNOSIS — R110 Nausea: Secondary | ICD-10-CM | POA: Diagnosis not present

## 2014-07-09 DIAGNOSIS — R1031 Right lower quadrant pain: Secondary | ICD-10-CM | POA: Diagnosis present

## 2014-07-09 DIAGNOSIS — Z79899 Other long term (current) drug therapy: Secondary | ICD-10-CM | POA: Diagnosis not present

## 2014-07-09 DIAGNOSIS — R109 Unspecified abdominal pain: Secondary | ICD-10-CM

## 2014-07-09 DIAGNOSIS — Z8679 Personal history of other diseases of the circulatory system: Secondary | ICD-10-CM | POA: Diagnosis not present

## 2014-07-09 LAB — CBC WITH DIFFERENTIAL/PLATELET
Basophils Absolute: 0 10*3/uL (ref 0.0–0.1)
Basophils Relative: 0 % (ref 0–1)
Eosinophils Absolute: 0 10*3/uL (ref 0.0–0.7)
Eosinophils Relative: 0 % (ref 0–5)
HCT: 46.3 % (ref 39.0–52.0)
Hemoglobin: 15.9 g/dL (ref 13.0–17.0)
Lymphocytes Relative: 10 % — ABNORMAL LOW (ref 12–46)
Lymphs Abs: 1 10*3/uL (ref 0.7–4.0)
MCH: 30.3 pg (ref 26.0–34.0)
MCHC: 34.3 g/dL (ref 30.0–36.0)
MCV: 88.2 fL (ref 78.0–100.0)
Monocytes Absolute: 0.6 10*3/uL (ref 0.1–1.0)
Monocytes Relative: 6 % (ref 3–12)
Neutro Abs: 8.1 10*3/uL — ABNORMAL HIGH (ref 1.7–7.7)
Neutrophils Relative %: 84 % — ABNORMAL HIGH (ref 43–77)
Platelets: 191 10*3/uL (ref 150–400)
RBC: 5.25 MIL/uL (ref 4.22–5.81)
RDW: 13 % (ref 11.5–15.5)
WBC: 9.8 10*3/uL (ref 4.0–10.5)

## 2014-07-09 LAB — URINALYSIS, ROUTINE W REFLEX MICROSCOPIC
Bilirubin Urine: NEGATIVE
Glucose, UA: NEGATIVE mg/dL
Ketones, ur: NEGATIVE mg/dL
Leukocytes, UA: NEGATIVE
Nitrite: NEGATIVE
Protein, ur: NEGATIVE mg/dL
Specific Gravity, Urine: 1.011 (ref 1.005–1.030)
Urobilinogen, UA: 0.2 mg/dL (ref 0.0–1.0)
pH: 5.5 (ref 5.0–8.0)

## 2014-07-09 LAB — COMPREHENSIVE METABOLIC PANEL
ALT: 16 U/L — ABNORMAL LOW (ref 17–63)
AST: 21 U/L (ref 15–41)
Albumin: 4.1 g/dL (ref 3.5–5.0)
Alkaline Phosphatase: 63 U/L (ref 38–126)
Anion gap: 9 (ref 5–15)
BUN: 14 mg/dL (ref 6–20)
CO2: 21 mmol/L — ABNORMAL LOW (ref 22–32)
Calcium: 9.1 mg/dL (ref 8.9–10.3)
Chloride: 105 mmol/L (ref 101–111)
Creatinine, Ser: 0.97 mg/dL (ref 0.61–1.24)
GFR calc Af Amer: >60 mL/min (ref >60)
GFR calc non Af Amer: >60 mL/min (ref >60)
Glucose, Bld: 124 mg/dL — ABNORMAL HIGH (ref 70–99)
Potassium: 4 mmol/L (ref 3.5–5.1)
Sodium: 135 mmol/L (ref 135–145)
Total Bilirubin: 1.4 mg/dL — ABNORMAL HIGH (ref 0.3–1.2)
Total Protein: 7 g/dL (ref 6.5–8.1)

## 2014-07-09 LAB — URINE MICROSCOPIC-ADD ON

## 2014-07-09 MED ORDER — SODIUM CHLORIDE 0.9 % IV BOLUS (SEPSIS)
1000.0000 mL | Freq: Once | INTRAVENOUS | Status: AC
Start: 2014-07-09 — End: 2014-07-09
  Administered 2014-07-09: 1000 mL via INTRAVENOUS

## 2014-07-09 MED ORDER — ONDANSETRON 4 MG PO TBDP: 4.0000 mg | ORAL_TABLET | Freq: Three times a day (TID) | ORAL | Status: DC | PRN

## 2014-07-09 MED ORDER — KETOROLAC TROMETHAMINE 30 MG/ML IJ SOLN
30.0000 mg | Freq: Once | INTRAMUSCULAR | Status: AC
  Administered 2014-07-09: 30 mg via INTRAVENOUS
  Filled 2014-07-09: qty 1

## 2014-07-09 MED ORDER — MORPHINE SULFATE 4 MG/ML IJ SOLN
4.0000 mg | Freq: Once | INTRAMUSCULAR | Status: AC
  Administered 2014-07-09: 4 mg via INTRAVENOUS
  Filled 2014-07-09: qty 1

## 2014-07-09 MED ORDER — OXYCODONE-ACETAMINOPHEN 5-325 MG PO TABS
2.0000 | ORAL_TABLET | Freq: Once | ORAL | Status: AC
  Administered 2014-07-09: 2 via ORAL
  Filled 2014-07-09: qty 2

## 2014-07-09 MED ORDER — OXYCODONE-ACETAMINOPHEN 5-325 MG PO TABS: 1.0000 | ORAL_TABLET | ORAL | Status: DC | PRN

## 2014-07-09 MED ORDER — ONDANSETRON HCL 4 MG/2ML IJ SOLN
4.0000 mg | Freq: Once | INTRAMUSCULAR | Status: AC
  Administered 2014-07-09: 4 mg via INTRAVENOUS
  Filled 2014-07-09: qty 2

## 2014-07-09 NOTE — Discharge Instructions (Signed)
Please follow up with your primary care physician in 1-2 days. If you do not have one please call the [place] number listed above. Please follow up with Dr. Jeffie Pollock to schedule a follow up appointment.  Please take pain medication and/or muscle relaxants as prescribed and as needed for pain. Please do not drive on narcotic pain medication or on muscle relaxants. Please read all discharge instructions and return precautions.   Kidney Stones Kidney stones (urolithiasis) are deposits that form inside your kidneys. The intense pain is caused by the stone moving through the urinary tract. When the stone moves, the ureter goes into spasm around the stone. The stone is usually passed in the urine.  CAUSES   A disorder that makes certain neck glands produce too much parathyroid hormone (primary hyperparathyroidism).  A buildup of uric acid crystals, similar to gout in your joints.  Narrowing (stricture) of the ureter.  A kidney obstruction present at birth (congenital obstruction).  Previous surgery on the kidney or ureters.  Numerous kidney infections. SYMPTOMS   Feeling sick to your stomach (nauseous).  Throwing up (vomiting).  Blood in the urine (hematuria).  Pain that usually spreads (radiates) to the groin.  Frequency or urgency of urination. DIAGNOSIS   Taking a history and physical exam.  Blood or urine tests.  CT scan.  Occasionally, an examination of the inside of the urinary bladder (cystoscopy) is performed. TREATMENT   Observation.  Increasing your fluid intake.  Extracorporeal shock wave lithotripsy--This is a noninvasive procedure that uses shock waves to break up kidney stones.  Surgery may be needed if you have severe pain or persistent obstruction. There are various surgical procedures. Most of the procedures are performed with the use of small instruments. Only small incisions are needed to accommodate these instruments, so recovery time  is minimized. The size, location, and chemical composition are all important variables that will determine the proper choice of action for you. Talk to your health care provider to better understand your situation so that you will minimize the risk of injury to yourself and your kidney.  HOME CARE INSTRUCTIONS   Drink enough water and fluids to keep your urine clear or pale yellow. This will help you to pass the stone or stone fragments.  Strain all urine through the provided strainer. Keep all particulate matter and stones for your health care provider to see. The stone causing the pain may be as small as a grain of salt. It is very important to use the strainer each and every time you pass your urine. The collection of your stone will allow your health care provider to analyze it and verify that a stone has actually passed. The stone analysis will often identify what you can do to reduce the incidence of recurrences.  Only take over-the-counter or prescription medicines for pain, discomfort, or fever as directed by your health care provider.  Make a follow-up appointment with your health care provider as directed.  Get follow-up X-rays if required. The absence of pain does not always mean that the stone has passed. It may have only stopped moving. If the urine remains completely obstructed, it can cause loss of kidney function or even complete destruction of the kidney. It is your responsibility to make sure X-rays and follow-ups are completed. Ultrasounds of the kidney can show blockages and the status of the kidney. Ultrasounds are not associated with any radiation and can be performed easily in a matter of minutes. SEEK  MEDICAL CARE IF:  You experience pain that is progressive and unresponsive to any pain medicine you have been prescribed. SEEK IMMEDIATE MEDICAL CARE IF:   Pain cannot be controlled with the prescribed medicine.  You have a fever or shaking chills.  The severity or  intensity of pain increases over 18 hours and is not relieved by pain medicine.  You develop a new onset of abdominal pain.  You feel faint or pass out.  You are unable to urinate. MAKE SURE YOU:   Understand these instructions.  Will watch your condition.  Will get help right away if you are not doing well or get worse. Document Released: 02/16/2005 Document Revised: 10/19/2012 Document Reviewed: 07/20/2012 [facility] Patient Information 2015 [place], [place]. This information is not intended to replace advice given to you by your health care provider. Make sure you discuss any questions you have with your health care provider.

## 2014-07-09 NOTE — ED Notes (Signed)
Pt reports that he started having rlq pain that started this morning. Reports that he felt nauseated this morning and states the pain is better when he is bending over and worse with standing straight up.

## 2014-07-09 NOTE — ED Provider Notes (Signed)
CSN: [id number]     Arrival date & time 07/09/14  1215 History   First MD Initiated Contact with Patient 07/09/14 1700     Chief Complaint  Patient presents with  . Abdominal Pain     (Consider location/radiation/quality/duration/timing/severity/associated sxs/prior Treatment) HPI Comments: Patient is a 54 yo M PMHx significant for HOCM, ICD placement presenting to the emergency department for right lower quadrant pain that awoke him this morning. States the pain is colicky in nature, better when he is bending over and worse with standing straight up. He endorses some nausea with pain episodes. No modifying factors identified. Denies any fevers, emesis, diarrhea, urinary symptoms.   Past Medical History  Diagnosis Date  . SYNCOPE   . Hypertrophic obstructive cardiomyopathy   . TIREDNESS   . ICD--[employer]     Dr. Graciela Husbands  . Orthostatic lightheadedness   . Ventricular tachycardia     rx via ICD   Past Surgical History  Procedure Laterality Date  . Guidant vitality icd impantation--guidant vitality t135--11/13/2003     Family History  Problem Relation Age of Onset  . Hypertension    . ALS Father   . Heart attack Brother    History  Substance Use Topics  . Smoking status: Never Smoker   . Smokeless tobacco: Never Used  . Alcohol Use: Yes     Comment: occasional     Review of Systems  Gastrointestinal: Positive for nausea and abdominal pain. Negative for vomiting and diarrhea.  All other systems reviewed and are negative.     Allergies  Review of patient's allergies indicates no known allergies.  Home Medications   Prior to Admission medications   Medication Sig Start Date End Date Taking? Authorizing Provider  acetaminophen (TYLENOL) 500 MG tablet Take 500 mg by mouth as needed for mild pain.    Yes Historical Provider, MD  atenolol (TENORMIN) 50 MG tablet Take 1 tablet (50 mg total) by mouth daily. 04/23/14  Yes Duke Salvia, MD  ibuprofen (ADVIL,MOTRIN) 200 MG  tablet Take 400 mg by mouth as needed.   Yes Historical Provider, MD  loratadine (CLARITIN) 10 MG tablet Take 10 mg by mouth daily.   Yes Historical Provider, MD  ondansetron (ZOFRAN ODT) 4 MG disintegrating tablet Take 1 tablet (4 mg total) by mouth every 8 (eight) hours as needed for nausea or vomiting. 07/09/14   Francee Piccolo, PA-C  oxyCODONE-acetaminophen (PERCOCET/ROXICET) 5-325 MG per tablet Take 1 tablet by mouth every 4 (four) hours as needed for severe pain. May take 2 tablets PO q 6 hours for severe pain - Do not take with Tylenol as this tablet already contains tylenol 07/09/14   Aryeh Butterfield, PA-C   BP 122/79 mmHg  Pulse 57  Temp(Src) 98 F (36.7 C) (Oral)  Resp 20  Ht 5\' 7"  (1.702 m)  Wt 185 lb (83.915 kg)  BMI 28.97 kg/m2  SpO2 96% Physical Exam  Constitutional: He is oriented to person, place, and time. He appears well-developed and well-nourished. No distress.  HENT:  Head: Normocephalic and atraumatic.  Right Ear: External ear normal.  Left Ear: External ear normal.  Nose: Nose normal.  Eyes: Conjunctivae are normal.  Neck: Neck supple.  Cardiovascular: Normal rate, regular rhythm and normal heart sounds.   Pulmonary/Chest: Effort normal and breath sounds normal.  Abdominal: Soft. Bowel sounds are normal. He exhibits no distension. There is tenderness in the right lower quadrant. There is no rigidity, no rebound, no guarding and no CVA  tenderness.    Musculoskeletal: Normal range of motion.  Neurological: He is alert and oriented to person, place, and time.  Skin: Skin is warm and dry. He is not diaphoretic.  Nursing note and vitals reviewed.   ED Course  Procedures (including critical care time) Medications  sodium chloride 0.9 % bolus 1,000 mL (0 mLs Intravenous Stopped 07/09/14 1850)  ondansetron (ZOFRAN) injection 4 mg (4 mg Intravenous Given 07/09/14 1722)  morphine 4 MG/ML injection 4 mg (4 mg Intravenous Given 07/09/14 1722)  ketorolac (TORADOL)  30 MG/ML injection 30 mg (30 mg Intravenous Given 07/09/14 1854)  oxyCODONE-acetaminophen (PERCOCET/ROXICET) 5-325 MG per tablet 2 tablet (2 tablets Oral Given 07/09/14 1950)    Labs Review Labs Reviewed  CBC WITH DIFFERENTIAL/PLATELET - Abnormal; Notable for the following:    Neutrophils Relative % 84 (*)    Neutro Abs 8.1 (*)    Lymphocytes Relative 10 (*)    All other components within normal limits  COMPREHENSIVE METABOLIC PANEL - Abnormal; Notable for the following:    CO2 21 (*)    Glucose, Bld 124 (*)    ALT 16 (*)    Total Bilirubin 1.4 (*)    All other components within normal limits  URINALYSIS, ROUTINE W REFLEX MICROSCOPIC - Abnormal; Notable for the following:    Hgb urine dipstick LARGE (*)    All other components within normal limits  URINE MICROSCOPIC-ADD ON - Abnormal; Notable for the following:    Casts HYALINE CASTS (*)    All other components within normal limits    Imaging Review Ct Renal Stone Study  07/09/2014   CLINICAL DATA:  Right lower quadrant abdominal pain.  EXAM: CT ABDOMEN AND PELVIS WITHOUT CONTRAST  TECHNIQUE: Multidetector CT imaging of the abdomen and pelvis was performed following the standard protocol without IV contrast.  COMPARISON:  Report of CT scan dated 11/15/2001  FINDINGS: There is a 9 mm stone obstructing the proximal right ureter creating slight right hydronephrosis. There is an irregular 6 mm stone in the lower pole with several tiny stones in the upper pole of the right kidney. There is an irregular 11 mm stone in the lower pole of the left kidney as well seen to others small calcifications in the left kidney.  Gallbladder has been removed. Liver parenchyma, bile ducts, spleen, pancreas, and adrenal glands are normal. Multiple diverticula in the descending and sigmoid portions of the colon. No diverticulitis. No adenopathy. No free air or free fluid. Bladder and prostate gland are normal. No osseous abnormality. AICD in place.  IMPRESSION: 1. 9  mm stone obstructing the proximal right ureter creating a slight right hydronephrosis. 2. Multiple bilateral renal calculi. 3. Diverticulosis of the descending and sigmoid portions of the colon   Electronically Signed   By: Francene Boyers M.D.   On: 07/09/2014 18:19     EKG Interpretation None      Discussed patient with Dr. Annabell Howells who will see the patient in his office this week for follow up.  MDM   Final diagnoses:  Abdominal pain  Nephrolithiasis    Filed Vitals:   07/09/14 1945  BP: 122/79  Pulse: 57  Temp:   Resp:    I have reviewed nursing notes, vital signs, and all appropriate lab and imaging results if ordered as above.   Pt has been diagnosed with a Kidney Stone via CT. 9mm stone noted with multiple stones within kidney.  Slight hydronephrosis noted, serum creatine WNL, vitals sign stable and  the pt does not have irratractable vomiting. Pt will be dc home with pain medications & has been advised to follow up with urology as advised by Dr. Annabell Howells. Patient d/w with Dr. Madilyn Hook, agrees with plan.     Francee Piccolo, PA-C 07/10/14 0140  Tilden Fossa, MD 07/12/14 [phone]

## 2014-07-09 NOTE — ED Notes (Signed)
Pt updated on current ED wait time 

## 2014-07-11 ENCOUNTER — Other Ambulatory Visit: Payer: Self-pay | Admitting: Urology

## 2014-07-11 ENCOUNTER — Telehealth: Payer: Self-pay | Admitting: Internal Medicine

## 2014-07-11 NOTE — Telephone Encounter (Signed)
New Prob   Requesting lithotripsy procedure clearance for kidney stone scheduled for 5/16 (Monday). Will fax over form to our office.   Pam is requesting a call back from nurse regarding pts upcoming procedure.

## 2014-07-12 ENCOUNTER — Encounter (HOSPITAL_COMMUNITY): Admission: RE | Payer: Self-pay | Source: Ambulatory Visit

## 2014-07-12 ENCOUNTER — Encounter (HOSPITAL_COMMUNITY): Payer: Self-pay | Admitting: *Deleted

## 2014-07-12 ENCOUNTER — Ambulatory Visit (HOSPITAL_COMMUNITY): Admission: RE | Admit: 2014-07-12 | Payer: BLUE CROSS/BLUE SHIELD | Source: Ambulatory Visit | Admitting: Urology

## 2014-07-12 SURGERY — LITHOTRIPSY, ESWL
Anesthesia: LOCAL | Laterality: Right

## 2014-07-12 NOTE — Telephone Encounter (Signed)
Informed Pam at [facility] that Dr. Graciela Husbands will not be back in the office until next week due to urgent family matter. She explained that she already rescheduled procedure to 5/19.  Advised that I will have Dr. Graciela Husbands review on Tuesday when he returns.

## 2014-07-12 NOTE — Telephone Encounter (Signed)
Follow up ° ° ° ° ° °Returning Donald Bond's call °

## 2014-07-18 NOTE — Telephone Encounter (Signed)
Spoke with Pam and advised her device clinic was completing clearance form. I faxed form once completed - patient cleared for lithotripsy procedure tomorrow with recommendation to have [facility] rep interrogate device post procedure.

## 2014-07-18 NOTE — Progress Notes (Addendum)
Paged [employer] rep to check on AICD after lithotripsy on 07/19/14.  Donald Bond    [phone].  Documentation received from [facility] today at 2 PM to interigate AICD after procedure.  1615   Paged [address] Jude Rep a second time. Called [phone] Information given about patient's procedure time and need for AICD check post procedure.Donald Bond is to arrive 1030 on 07/19/14 to interogate AICD.

## 2014-07-19 ENCOUNTER — Encounter (HOSPITAL_COMMUNITY): Payer: Self-pay

## 2014-07-19 ENCOUNTER — Encounter (HOSPITAL_COMMUNITY)
Admission: RE | Disposition: A | Discharge status: Home / Self Care | Payer: Self-pay | Source: Ambulatory Visit | Attending: Urology

## 2014-07-19 ENCOUNTER — Ambulatory Visit (HOSPITAL_COMMUNITY)
Admission: RE | Admit: 2014-07-19 | Discharge: 2014-07-19 | Disposition: A | Discharge status: Home / Self Care | Payer: BLUE CROSS/BLUE SHIELD | Source: Ambulatory Visit | Attending: Urology | Admitting: Urology

## 2014-07-19 ENCOUNTER — Ambulatory Visit (HOSPITAL_COMMUNITY): Payer: BLUE CROSS/BLUE SHIELD | Admitting: Anesthesiology

## 2014-07-19 ENCOUNTER — Ambulatory Visit (HOSPITAL_COMMUNITY): Payer: BLUE CROSS/BLUE SHIELD

## 2014-07-19 DIAGNOSIS — Z95810 Presence of automatic (implantable) cardiac defibrillator: Secondary | ICD-10-CM | POA: Insufficient documentation

## 2014-07-19 DIAGNOSIS — N201 Calculus of ureter: Secondary | ICD-10-CM

## 2014-07-19 DIAGNOSIS — I421 Obstructive hypertrophic cardiomyopathy: Secondary | ICD-10-CM | POA: Diagnosis not present

## 2014-07-19 SURGERY — LITHOTRIPSY, ESWL
Anesthesia: Monitor Anesthesia Care | Laterality: Right

## 2014-07-19 MED ORDER — LACTATED RINGERS IV SOLN
INTRAVENOUS | Status: DC
  Administered 2014-07-19 (×2): via INTRAVENOUS

## 2014-07-19 MED ORDER — FENTANYL CITRATE (PF) 100 MCG/2ML IJ SOLN
INTRAMUSCULAR | Status: DC | PRN
  Administered 2014-07-19: 25 ug via INTRAVENOUS

## 2014-07-19 MED ORDER — SENNOSIDES-DOCUSATE SODIUM 8.6-50 MG PO TABS: 1.0000 | ORAL_TABLET | Freq: Two times a day (BID) | ORAL | Status: DC

## 2014-07-19 MED ORDER — PROMETHAZINE HCL 25 MG/ML IJ SOLN
6.2500 mg | INTRAMUSCULAR | Status: DC | PRN
Start: 2014-07-19 — End: 2014-07-19

## 2014-07-19 MED ORDER — DIPHENHYDRAMINE HCL 25 MG PO CAPS: 25.0000 mg | ORAL_CAPSULE | ORAL | Status: DC

## 2014-07-19 MED ORDER — DIAZEPAM 5 MG PO TABS: 10.0000 mg | ORAL_TABLET | ORAL | Status: DC

## 2014-07-19 MED ORDER — PROPOFOL INFUSION 10 MG/ML OPTIME: INTRAVENOUS | Status: DC | PRN

## 2014-07-19 MED ORDER — PROPOFOL 10 MG/ML IV BOLUS
INTRAVENOUS | Status: AC
  Filled 2014-07-19: qty 20

## 2014-07-19 MED ORDER — OXYCODONE-ACETAMINOPHEN 5-325 MG PO TABS
1.0000 | ORAL_TABLET | ORAL | Status: DC | PRN
  Administered 2014-07-19: 1 via ORAL
  Filled 2014-07-19: qty 1

## 2014-07-19 MED ORDER — PROPOFOL 10 MG/ML IV BOLUS
INTRAVENOUS | Status: DC | PRN
  Administered 2014-07-19: 450 mg via INTRAVENOUS

## 2014-07-19 MED ORDER — MIDAZOLAM HCL 2 MG/2ML IJ SOLN
INTRAMUSCULAR | Status: AC
  Filled 2014-07-19: qty 2

## 2014-07-19 MED ORDER — TAMSULOSIN HCL 0.4 MG PO CAPS: 0.4000 mg | ORAL_CAPSULE | Freq: Every day | ORAL | Status: DC

## 2014-07-19 MED ORDER — FENTANYL CITRATE (PF) 100 MCG/2ML IJ SOLN: 25.0000 ug | INTRAMUSCULAR | Status: DC | PRN

## 2014-07-19 MED ORDER — SODIUM CHLORIDE 0.9 % IV SOLN: INTRAVENOUS | Status: DC

## 2014-07-19 MED ORDER — MIDAZOLAM HCL 5 MG/5ML IJ SOLN
INTRAMUSCULAR | Status: DC | PRN
  Administered 2014-07-19: 1 mg via INTRAVENOUS

## 2014-07-19 MED ORDER — CIPROFLOXACIN HCL 500 MG PO TABS
500.0000 mg | ORAL_TABLET | ORAL | Status: AC
  Administered 2014-07-19: 500 mg via ORAL
  Filled 2014-07-19: qty 1

## 2014-07-19 MED ORDER — OXYCODONE-ACETAMINOPHEN 5-325 MG PO TABS: 1.0000 | ORAL_TABLET | ORAL | Status: DC | PRN

## 2014-07-19 MED ORDER — FENTANYL CITRATE (PF) 100 MCG/2ML IJ SOLN
INTRAMUSCULAR | Status: AC
  Filled 2014-07-19: qty 2

## 2014-07-19 NOTE — Discharge Instructions (Signed)
1 - You may have urinary urgency (bladder spasms), bloody urine on / off, and pass small stone fragements on / off for up to 2 weeks. This is normal.  2 - Call MD or go to ER for fever >102, severe pain / nausea / vomiting not relieved by medications, or acute change in medical status

## 2014-07-19 NOTE — Progress Notes (Signed)
Report received from Donivan Scull CRNA post lithotripsy w MAC

## 2014-07-19 NOTE — Progress Notes (Signed)
Dr Jacklynn Bue into see patient prior to DC

## 2014-07-19 NOTE — Progress Notes (Signed)
Dr Berneice Heinrich into see patient

## 2014-07-19 NOTE — Anesthesia Postprocedure Evaluation (Signed)
  Anesthesia Post-op Note  Patient: Donald Bond  Procedure(s) Performed: Procedure(s): RIGHT EXTRACORPOREAL SHOCK WAVE LITHOTRIPSY (ESWL) WITH MAC ANESTHESIA (Right)  Patient Location: PACU  Anesthesia Type:MAC  Level of Consciousness: awake and alert   Airway and Oxygen Therapy: Patient Spontanous Breathing  Post-op Pain: none  Post-op Assessment: Post-op Vital signs reviewed, Patient's Cardiovascular Status Stable and Respiratory Function Stable  Post-op Vital Signs: stable  Last Vitals:  Filed Vitals:   07/19/14 1008  BP: 146/92  Pulse: 63  Temp: 36.3 C  Resp: 18    Complications: No apparent anesthesia complications

## 2014-07-19 NOTE — Brief Op Note (Signed)
07/19/2014  9:57 AM  PATIENT:  Donald Bond  54 y.o. male  PRE-OPERATIVE DIAGNOSIS:  RIGHT MID URETERAL STONE   POST-OPERATIVE DIAGNOSIS:  * No post-op diagnosis entered *  PROCEDURE:  Procedure(s): RIGHT EXTRACORPOREAL SHOCK WAVE LITHOTRIPSY (ESWL) WITH MAC ANESTHESIA (Right)  SURGEON:  Surgeon(s) and Role:    * [name], MD - Primary  PHYSICIAN ASSISTANT:   ASSISTANTS: none   ANESTHESIA:   MAC  EBL:     BLOOD ADMINISTERED:none  DRAINS: none   LOCAL MEDICATIONS USED:  NONE  SPECIMEN:  No Specimen  DISPOSITION OF SPECIMEN:  N/A  COUNTS:  YES  TOURNIQUET:  * No tourniquets in log *  DICTATION: .Note written in paper chart  PLAN OF CARE: Discharge to home after PACU  PATIENT DISPOSITION:  Short Stay   Delay start of Pharmacological VTE agent (>24hrs) due to surgical blood loss or risk of bleeding: yes

## 2014-07-19 NOTE — Progress Notes (Signed)
Bonna Gains , [employer] rep into Interrogate defibrillator= normal post Litho. He is taking a copy to Dr Odessa Fleming office. No changes were made at this time

## 2014-07-19 NOTE — Anesthesia Preprocedure Evaluation (Addendum)
Anesthesia Evaluation  Patient identified by MRN, date of birth, ID band Patient awake    Reviewed: Allergy & Precautions, NPO status , Patient's Chart, lab work & pertinent test results  History of Anesthesia Complications Negative for: history of anesthetic complications  Airway Mallampati: II   Neck ROM: Full    Dental  (+) Teeth Intact   Pulmonary neg pulmonary ROS,  breath sounds clear to auscultation        Cardiovascular + dysrhythmias + Cardiac Defibrillator Rhythm:Regular Rate:Normal  Pt has HOCM and AICD   Neuro/Psych negative neurological ROS     GI/Hepatic negative GI ROS, Neg liver ROS,   Endo/Other  negative endocrine ROS  Renal/GU negative Renal ROS     Musculoskeletal   Abdominal   Peds  Hematology   Anesthesia Other Findings   Reproductive/Obstetrics                            Anesthesia Physical Anesthesia Plan  ASA: III  Anesthesia Plan: MAC   Post-op Pain Management:    Induction: Intravenous  Airway Management Planned: Natural Airway and Simple Face Mask  Additional Equipment:   Intra-op Plan:   Post-operative Plan:   Informed Consent: I have reviewed the patients History and Physical, chart, labs and discussed the procedure including the risks, benefits and alternatives for the proposed anesthesia with the patient or authorized representative who has indicated his/her understanding and acceptance.     Plan Discussed with: CRNA and Surgeon  Anesthesia Plan Comments: (Plan magnet over AICD)        Anesthesia Quick Evaluation

## 2014-07-19 NOTE — Transfer of Care (Signed)
Immediate Anesthesia Transfer of Care Note  Patient: Donald Bond  Procedure(s) Performed: Procedure(s): RIGHT EXTRACORPOREAL SHOCK WAVE LITHOTRIPSY (ESWL) WITH MAC ANESTHESIA (Right)  Patient Location: Short Stay  Anesthesia Type:MAC  Level of Consciousness: awake, sedated and patient cooperative  Airway & Oxygen Therapy: Patient Spontanous Breathing  Post-op Assessment: Report given to RN and Post -op Vital signs reviewed and stable  Post vital signs: Reviewed and stable  Last Vitals:  Filed Vitals:   07/19/14 0648  BP: 124/88  Pulse: 54  Temp: 36.3 C  Resp: 20    Complications: No apparent anesthesia complications

## 2014-07-19 NOTE — H&P (Signed)
Donald Bond is an 54 y.o. male.    Chief Complaint: Pre-OP Right Shockwave Lithotripsy  HPI:   1 - Right Ureteral Stone - 56mm Rt ureteral stone near L3 by ER CT 07/2014 on eval right flank pain. Stone visible on KUB. 1100HU, SSD 14.5 cm. He has elected to [facility] primary lithotripsy with MAC.   PMH sig for AICD, no ischemic heart disease.   Today "Donald Bond" is seen to proceed with right SWL under MAC.  Past Medical History  Diagnosis Date  . SYNCOPE   . Hypertrophic obstructive cardiomyopathy   . TIREDNESS   . ICD--[employer]     Dr. Graciela Husbands  . Orthostatic lightheadedness   . Ventricular tachycardia     rx via ICD    Past Surgical History  Procedure Laterality Date  . Guidant vitality icd impantation--guidant vitality t135--11/13/2003      Family History  Problem Relation Age of Onset  . Hypertension    . ALS Father   . Heart attack Brother    Social History:  reports that he has never smoked. He has never used smokeless tobacco. He reports that he drinks alcohol. He reports that he does not use illicit drugs.  Allergies: No Known Allergies  No prescriptions prior to admission    No results found for this or any previous visit (from the past 48 hour(s)). No results found.  Review of Systems  Constitutional: Negative.  Negative for fever and chills.  HENT: Negative.   Eyes: Negative.   Respiratory: Negative.   Cardiovascular: Negative.   Gastrointestinal: Negative.   Genitourinary: Positive for flank pain.  Musculoskeletal: Negative.   Skin: Negative.   Neurological: Negative.   Endo/Heme/Allergies: Negative.   Psychiatric/Behavioral: Negative.     There were no vitals taken for this visit. Physical Exam  Constitutional: He appears well-developed.  HENT:  Head: Normocephalic.  Eyes: Pupils are equal, round, and reactive to light.  Neck: Normal range of motion.  Cardiovascular: Normal rate.   Respiratory: Effort normal.  GI: Soft.  Genitourinary: Penis  normal.  Mild Rt CVAT  Neurological: He is alert.  Skin: Skin is warm.  Psychiatric: He has a normal mood and affect. His behavior is normal. Judgment and thought content normal.     Assessment/Plan  1 - Right Ureteral Stone - We discussed shockwave lithotripsy in detail as well as my "rule of 9s" with stones <66mm, less than 900 HU, and skin to stone distance <9cm having approximately 90% treatment success with single session of treatment. We then addressed how stones that are larger, more dense, and in patients with less favorable anatomy have incrementally decreased success rates. We discussed risks including, bleeding, infection, hematoma, loss of kidney, need for staged therapy, need for adjunctive therapy and requirement to refrain from any anticoagulants, anti-platelet or aspirin-like products peri-procedureally. After careful consideration, the patient has chosen to proceed.   Low threshold to abandon procedure if significant ectopy is seen. This should be low risk as stone right sided.    Tami Blass 07/19/2014, 6:38 AM

## 2014-09-04 ENCOUNTER — Ambulatory Visit (INDEPENDENT_AMBULATORY_CARE_PROVIDER_SITE_OTHER): Payer: BLUE CROSS/BLUE SHIELD | Admitting: *Deleted

## 2014-09-04 DIAGNOSIS — I4901 Ventricular fibrillation: Secondary | ICD-10-CM

## 2014-09-04 NOTE — Progress Notes (Signed)
Remote ICD transmission.   

## 2014-09-09 LAB — CUP PACEART REMOTE DEVICE CHECK
Battery Remaining Longevity: 29 mo
Battery Voltage: 2.56 V
Brady Statistic RV Percent Paced: 1 %
Date Time Interrogation Session: 20160705060014
HighPow Impedance: 51 Ohm
Lead Channel Impedance Value: 640 Ohm
Lead Channel Pacing Threshold Amplitude: 0.75 V
Lead Channel Pacing Threshold Pulse Width: 0.5 ms
Lead Channel Sensing Intrinsic Amplitude: 11.7 mV
Lead Channel Setting Pacing Amplitude: 2.5 V
Lead Channel Setting Pacing Pulse Width: 0.5 ms
Lead Channel Setting Sensing Sensitivity: 0.3 mV
Pulse Gen Serial Number: 557550
Zone Setting Detection Interval: 250 ms
Zone Setting Detection Interval: 280 ms
Zone Setting Detection Interval: 375 ms

## 2014-10-05 ENCOUNTER — Encounter: Payer: Self-pay | Admitting: Cardiology

## 2014-10-12 ENCOUNTER — Encounter: Payer: Self-pay | Admitting: Internal Medicine

## 2014-10-23 ENCOUNTER — Encounter: Payer: Self-pay | Admitting: Cardiology

## 2014-12-06 ENCOUNTER — Ambulatory Visit (INDEPENDENT_AMBULATORY_CARE_PROVIDER_SITE_OTHER): Payer: BLUE CROSS/BLUE SHIELD | Admitting: *Deleted

## 2014-12-06 ENCOUNTER — Encounter: Payer: Self-pay | Admitting: Internal Medicine

## 2014-12-06 DIAGNOSIS — I4901 Ventricular fibrillation: Secondary | ICD-10-CM | POA: Diagnosis not present

## 2014-12-06 NOTE — Progress Notes (Signed)
Remote ICD transmission.   

## 2014-12-07 LAB — CUP PACEART REMOTE DEVICE CHECK
Battery Remaining Longevity: 29 mo
Battery Voltage: 2.56 V
Brady Statistic RV Percent Paced: 1 %
Date Time Interrogation Session: 20161006060015
HighPow Impedance: 57 Ohm
Lead Channel Impedance Value: 660 Ohm
Lead Channel Pacing Threshold Amplitude: 0.75 V
Lead Channel Pacing Threshold Pulse Width: 0.5 ms
Lead Channel Sensing Intrinsic Amplitude: 12 mV
Lead Channel Setting Pacing Amplitude: 2.5 V
Lead Channel Setting Pacing Pulse Width: 0.5 ms
Lead Channel Setting Sensing Sensitivity: 0.3 mV
Pulse Gen Serial Number: 557550
Zone Setting Detection Interval: 250 ms
Zone Setting Detection Interval: 280 ms
Zone Setting Detection Interval: 375 ms

## 2015-01-09 ENCOUNTER — Encounter: Payer: Self-pay | Admitting: Cardiology

## 2015-01-29 ENCOUNTER — Encounter: Payer: Self-pay | Admitting: Cardiology

## 2015-03-07 ENCOUNTER — Telehealth: Payer: Self-pay | Admitting: Cardiology

## 2015-03-07 ENCOUNTER — Encounter: Payer: BLUE CROSS/BLUE SHIELD | Admitting: *Deleted

## 2015-03-07 NOTE — Telephone Encounter (Signed)
LMOVM reminding pt to send remote transmission.   

## 2015-03-08 ENCOUNTER — Encounter: Payer: Self-pay | Admitting: Cardiology

## 2015-05-18 ENCOUNTER — Other Ambulatory Visit: Payer: Self-pay | Admitting: Internal Medicine

## 2015-05-24 ENCOUNTER — Other Ambulatory Visit: Payer: Self-pay | Admitting: Internal Medicine

## 2015-06-24 ENCOUNTER — Other Ambulatory Visit: Payer: Self-pay | Admitting: Internal Medicine

## 2015-07-09 ENCOUNTER — Other Ambulatory Visit: Payer: Self-pay | Admitting: *Deleted

## 2015-07-09 MED ORDER — ATENOLOL 50 MG PO TABS: 50.0000 mg | ORAL_TABLET | Freq: Every day | ORAL | Status: DC

## 2015-07-10 ENCOUNTER — Other Ambulatory Visit: Payer: Self-pay | Admitting: Internal Medicine

## 2015-07-25 NOTE — Progress Notes (Signed)
Electrophysiology Office Note Date: 07/26/2015  ID:  Donald Bond, DOB 11/13/1960, MRN [id number]  PCP: RECORD,CHARLES Electrophysiologist: Graciela Husbands  CC: Routine ICD follow-up  Donald Bond is a 55 y.o. male seen today for Dr Graciela Husbands.  He presents today for routine electrophysiology followup.  Since last being seen in our clinic, the patient reports doing very well. He denies chest pain, palpitations, dyspnea, PND, orthopnea, nausea, vomiting, dizziness, syncope, edema, weight gain, or early satiety.  He has not had ICD shocks.   Device History: [employer]J single chamber ICD implanted 2000 for HCM; gen change 2005; gen change 2010 History of appropriate therapy: Yes History of AAD therapy: No   Past Medical History  Diagnosis Date  . SYNCOPE   . Hypertrophic obstructive cardiomyopathy   . TIREDNESS   . ICD--[employer]     Dr. Graciela Husbands  . Orthostatic lightheadedness   . Ventricular tachycardia (HCC)     rx via ICD   Past Surgical History  Procedure Laterality Date  . Guidant vitality icd impantation--guidant vitality t135--11/13/2003    . Hydrocele excision / repair Left feb 2015  . Cholecystectomy      lap 2003    Current Outpatient Prescriptions  Medication Sig Dispense Refill  . acetaminophen (TYLENOL) 500 MG tablet Take 1,000 mg by mouth as needed for moderate pain or headache.     Marland Kitchen atenolol (TENORMIN) 50 MG tablet Take 1 tablet (50 mg total) by mouth daily. 20 tablet 0  . atenolol (TENORMIN) 50 MG tablet take 1 tablet by mouth once daily 15 tablet 2  . calamine lotion Apply 1 application topically as needed for itching.    Marland Kitchen ibuprofen (ADVIL,MOTRIN) 200 MG tablet Take 800 mg by mouth every 4 (four) hours as needed for headache or moderate pain.     Marland Kitchen loratadine (CLARITIN) 10 MG tablet Take 10 mg by mouth daily as needed for allergies.      No current facility-administered medications for this visit.    Allergies:   Review of patient's allergies indicates no known allergies.    Social History: Social History   Social History  . Marital Status: Married    Spouse Name: N/A  . Number of Children: N/A  . Years of Education: N/A   Occupational History  . Not on file.   Social History Main Topics  . Smoking status: Never Smoker   . Smokeless tobacco: Never Used  . Alcohol Use: Yes     Comment: occasional   . Drug Use: No  . Sexual Activity: Not on file   Other Topics Concern  . Not on file   Social History Narrative    Family History: Family History  Problem Relation Age of Onset  . Hypertension    . ALS Father   . Heart attack Brother     Review of Systems: All other systems reviewed and are otherwise negative except as noted above.   Physical Exam: VS:  BP 120/62 mmHg  Pulse 68  Ht 5' 5.5" (1.664 m)  Wt 182 lb 6.4 oz (82.736 kg)  BMI 29.88 kg/m2 , BMI Body mass index is 29.88 kg/(m^2).  GEN- The patient is well appearing, alert and oriented x 3 today.   HEENT: normocephalic, atraumatic; sclera clear, conjunctiva pink; hearing intact; oropharynx clear; neck supple  Lungs- Clear to ausculation bilaterally, normal work of breathing.  No wheezes, rales, rhonchi Heart- Regular rate and rhythm  GI- soft, non-tender, non-distended, bowel sounds present  Extremities- no  clubbing, cyanosis, or edema; DP/PT/radial pulses 2+ bilaterally MS- no significant deformity or atrophy Skin- warm and dry, no rash or lesion; ICD pocket well healed Psych- euthymic mood, full affect Neuro- strength and sensation are intact  ICD interrogation- reviewed in detail today,  See PACEART report  EKG:  EKG is ordered today. The ekg ordered today shows sinus rhythm, rate 61, 1st degree AV block ( )  Recent Labs: No results found for requested labs within last 365 days.   Wt Readings from Last 3 Encounters:  07/26/15 182 lb 6.4 oz (82.736 kg)  07/19/14 187 lb 4 oz (84.936 kg)  07/09/14 185 lb (83.915 kg)     Other studies Reviewed: Additional  studies/ records that were reviewed today include: Dr Odessa Fleming office notes  Assessment and Plan: 1.  HCM Normal ICD function See Arita Miss Art report No changes today He has had genetic testing on sons - younger son is negative, older son is positive  Will ask [employer] to ship cell adapter to patient today for continued remote monitoring.  His ICD is not on advisory.   2.  Ventricular tachycardia 1 episode of NSVT 05/19/15 - asymptomatic   Current medicines are reviewed at length with the patient today.   The patient does not have concerns regarding his medicines.  The following changes were made today:  none  Labs/ tests ordered today include: none  Orders Placed This Encounter  Procedures  . EKG 12-Lead     Disposition:   Follow up with Arsenio Katz, Dr Graciela Husbands 1 year    Signed, Gypsy Balsam, NP 07/26/2015 10:11 AM  [facility] [address] [place] [address] [phone] (office) [phone] (fax)

## 2015-07-26 ENCOUNTER — Ambulatory Visit (INDEPENDENT_AMBULATORY_CARE_PROVIDER_SITE_OTHER): Payer: BLUE CROSS/BLUE SHIELD | Admitting: Nurse Practitioner

## 2015-07-26 ENCOUNTER — Encounter: Payer: Self-pay | Admitting: Internal Medicine

## 2015-07-26 ENCOUNTER — Encounter: Payer: Self-pay | Admitting: Nurse Practitioner

## 2015-07-26 VITALS — BP 120/62 | HR 68 | Ht 65.5 in | Wt 182.4 lb

## 2015-07-26 DIAGNOSIS — I472 Ventricular tachycardia, unspecified: Secondary | ICD-10-CM

## 2015-07-26 DIAGNOSIS — I421 Obstructive hypertrophic cardiomyopathy: Secondary | ICD-10-CM

## 2015-07-26 NOTE — Patient Instructions (Signed)
Medication Instructions:   Your physician recommends that you continue on your current medications as directed. Please refer to the Current Medication list given to you today.   If you need a refill on your cardiac medications before your next appointment, please call your pharmacy.  Labwork: NONE ORDER TODAY    Testing/Procedures: NONE ORDER TODAY    Follow-Up:  Your physician wants you to follow-up in: ONE YEAR WITH KLEIN.  You will receive a reminder letter in the mail two months in advance. If you don't receive a letter, please call our office to schedule the follow-up appointment.  Remote monitoring is used to monitor your Pacemaker of ICD from home. This monitoring reduces the number of office visits required to check your device to one time per year. It allows [place] to keep an eye on the functioning of your device to ensure it is working properly. You are scheduled for a device check from home on . 10/25/15..You may send your transmission at any time that day. If you have a wireless device, the transmission will be sent automatically. After your physician reviews your transmission, you will receive a postcard with your next transmission date.     Any Other Special Instructions Will Be Listed Below (If Applicable).

## 2015-07-27 LAB — CUP PACEART INCLINIC DEVICE CHECK
Date Time Interrogation Session: 20170527041231
Implantable Lead Implant Date: 20000815
Implantable Lead Location: 753860
Implantable Lead Model: 144
Implantable Lead Serial Number: 334759
Pulse Gen Serial Number: 557550

## 2015-08-01 ENCOUNTER — Other Ambulatory Visit: Payer: Self-pay | Admitting: *Deleted

## 2015-08-01 MED ORDER — ATENOLOL 50 MG PO TABS: 50.0000 mg | ORAL_TABLET | Freq: Every day | ORAL | Status: DC

## 2016-01-29 ENCOUNTER — Telehealth: Payer: Self-pay | Admitting: Internal Medicine

## 2016-01-29 MED ORDER — ATENOLOL 25 MG PO TABS: 50.0000 mg | ORAL_TABLET | Freq: Every day | ORAL | 3 refills | Status: DC

## 2016-01-29 NOTE — Telephone Encounter (Signed)
I spoke with Loagn at [facility]. She states Atenolol 50 mg tablets are on backorder. She is asking if the patient can take 2-25 mg tablets instead. I advised that was fine, new rx sent.

## 2016-01-29 NOTE — Telephone Encounter (Signed)
New Message:    Atenolol 50 mg is on back order,can he take 2 Atenolol 25 mg a day until the 50 mg comes in please?

## 2016-04-27 ENCOUNTER — Ambulatory Visit (INDEPENDENT_AMBULATORY_CARE_PROVIDER_SITE_OTHER): Payer: BLUE CROSS/BLUE SHIELD | Admitting: *Deleted

## 2016-04-27 DIAGNOSIS — I472 Ventricular tachycardia, unspecified: Secondary | ICD-10-CM

## 2016-04-27 NOTE — Progress Notes (Signed)
Patient checked in DC d/t c/o vibratory alert over the last several days.  ICD check in clinic. Normal device function. Threshold and sensing consistent with previous device measurements. Impedance trend stable over time. 30 VHR episodes, longest lasting ~55min, peak V 203. Histogram distribution appropriate for patient and level of activity. No changes made this session. Device programmed at appropriate safety margins. Device programmed to optimize intrinsic conduction. Battery reached ERI 2/23. Will have schedule call to set up SK appointment.

## 2016-05-29 ENCOUNTER — Ambulatory Visit (INDEPENDENT_AMBULATORY_CARE_PROVIDER_SITE_OTHER): Payer: BLUE CROSS/BLUE SHIELD | Admitting: Internal Medicine

## 2016-05-29 ENCOUNTER — Encounter: Payer: Self-pay | Admitting: Internal Medicine

## 2016-05-29 VITALS — BP 114/76 | HR 61 | Ht 67.0 in | Wt 190.0 lb

## 2016-05-29 DIAGNOSIS — G4730 Sleep apnea, unspecified: Secondary | ICD-10-CM | POA: Diagnosis not present

## 2016-05-29 DIAGNOSIS — Z01812 Encounter for preprocedural laboratory examination: Secondary | ICD-10-CM

## 2016-05-29 DIAGNOSIS — I421 Obstructive hypertrophic cardiomyopathy: Secondary | ICD-10-CM

## 2016-05-29 DIAGNOSIS — I472 Ventricular tachycardia, unspecified: Secondary | ICD-10-CM

## 2016-05-29 DIAGNOSIS — Z95810 Presence of automatic (implantable) cardiac defibrillator: Secondary | ICD-10-CM

## 2016-05-29 MED ORDER — ATENOLOL 25 MG PO TABS: 50.0000 mg | ORAL_TABLET | Freq: Every day | ORAL | 3 refills | Status: DC

## 2016-05-29 MED ORDER — APIXABAN 5 MG PO TABS: 5.0000 mg | ORAL_TABLET | Freq: Two times a day (BID) | ORAL | 0 refills | Status: DC

## 2016-05-29 MED ORDER — APIXABAN 5 MG PO TABS: 5.0000 mg | ORAL_TABLET | Freq: Two times a day (BID) | ORAL | 11 refills | Status: DC

## 2016-05-29 NOTE — Progress Notes (Signed)
Patient Care Team: Leonette Most Record as PCP - General   HPI  Donald Bond is a 56 y.o. male Seen in followup for HCM associated with syncope and previously treated ventricular tachycardia;  He has had 3 previous devices.  He hasreached ERI      He is in Afib .  He has no awareness of palpitations or changes in exercise tolerance.  The patient denies chest pain, shortness of breath, nocturnal dyspnea, orthopnea or peripheral edema.   He has undergone genetic testing. Younger son is negative  DOB 2001;  older son is positive DOB 1998 and is being followed at [facility]  Past Medical History:  Diagnosis Date  . Hypertr obst cardiomyop   . ICD--[employer]    Dr. Graciela Husbands  . Orthostatic lightheadedness   . SYNCOPE   . TIREDNESS   . Ventricular tachycardia (HCC)    rx via ICD    Past Surgical History:  Procedure Laterality Date  . CHOLECYSTECTOMY     lap 2003  . Guidant Vitality ICD Impantation--Guidant Vitality T135--11/13/2003    . HYDROCELE EXCISION / REPAIR Left feb 2015    Current Outpatient Prescriptions  Medication Sig Dispense Refill  . acetaminophen (TYLENOL) 500 MG tablet Take 1,000 mg by mouth as needed for moderate pain or headache.     Marland Kitchen atenolol (TENORMIN) 25 MG tablet Take 2 tablets (50 mg total) by mouth daily. 180 tablet 3  . calamine lotion Apply 1 application topically as needed for itching.    . Coenzyme Q10 (CO Q10) 100 MG CAPS Take 2 capsules by mouth daily.    Marland Kitchen ibuprofen (ADVIL,MOTRIN) 200 MG tablet Take 800 mg by mouth every 4 (four) hours as needed for headache or moderate pain.     Marland Kitchen loratadine (CLARITIN) 10 MG tablet Take 10 mg by mouth daily as needed for allergies.      No current facility-administered medications for this visit.     No Known Allergies  Review of Systems negative except from HPI and PMH  Physical Exam BP 114/76   Pulse 61   Ht 5\' 7"  (1.702 m)   Wt 190 lb (86.2 kg)   SpO2 97%   BMI 29.76 kg/m  Well developed and  nourished in no acute distress HENT normal Neck supple with JVP-flat Clear Regular rate and rhythm, no murmurs or gallops Abd-soft with active BS No Clubbing cyanosis edema Skin-warm and dry A & Oriented  Grossly normal sensory and motor function  ECG personally reviewed atrial fibrillation 79 Intervals-/14/42 Left bundle branch block  Assessment and plan  Hypertrophic cardio myopathy largely asymptomatic we'll continue him on his current medications  Ventricular tachycardia with appropriate therapy    Atrial Fibrillation-persistent   Sleep disordered breathing and daytime somnolence  Status post ICD implantation-[employer] Jude  The patient's device was interrogated.  The information was reviewed. No changes were made in the programming.    Has had intercurrent ventricular tachycardia, terminaitng without therapy     He has developed atrial fibrillation which is largely symptomatic. The HCM population is felt to be at high risk for thromboembolism and anticoagulation is indicated regardless of CHADS-VASc score. We will begin him on apixoban  There may be a role for ablation even an asymptomatic patient with atrial fibrillation. We will get an echocardiogram to look at left atrial dimensions. They tend to be more severely enlarged in patients with HCM. With his history of sleep disorder breathing and daytime somnolence and  now his atrial fibrillation sleep study is indicated.  His device has reached ERI  We have reviewed the benefits and risks of generator replacement.  These include but are not limited to lead fracture and infection.  The patient understands, agrees and is willing to proceed.    Given the fact that this is his fourth device we will use a antimicrobial pouch; we will also change to [employer] definitive track record battery longevity  . H

## 2016-05-29 NOTE — Patient Instructions (Addendum)
Medication Instructions: - Your physician has recommended you make the following change in your medication:  1) Start Eliquis 5 mg- take one tablet by mouth twice daily  Labwork: - Your physician recommends that you return for lab work: Monday 06/22/16- BMP/CBC  Procedures/Testing: - Your physician has recommended that you have a defibrillator generator (battery) change/ Cardioversion- in 4 weeks  ** Monday 06/29/16- you will arrive at 5:30 am for a 7:30 am procedure at [facility]-  A detailed letter of instructions will be mailed to you.   - Your physician has requested that you have an echocardiogram. Echocardiography is a painless test that uses sound waves to create images of your heart. It provides your doctor with information about the size and shape of your heart and how well your heart's chambers and valves are working. This procedure takes approximately one hour. There are no restrictions for this procedure.  - Your physician has recommended that you have a sleep study. This test records several body functions during sleep, including: brain activity, eye movement, oxygen and carbon dioxide blood levels, heart rate and rhythm, breathing rate and rhythm, the flow of air through your mouth and nose, snoring, body muscle movements, and chest and belly movement.  Follow-Up: - Your physician recommends that you schedule a follow-up appointment in: about 14 days (from 06/29/16) with the [facility] for a wound check  Any Additional Special Instructions Will Be Listed Below (If Applicable).     If you need a refill on your cardiac medications before your next appointment, please call your pharmacy.

## 2016-06-01 ENCOUNTER — Telehealth: Payer: Self-pay | Admitting: *Deleted

## 2016-06-01 NOTE — Telephone Encounter (Signed)
Patient notified of sleep study left message on cell vm

## 2016-06-04 ENCOUNTER — Encounter: Payer: Self-pay | Admitting: *Deleted

## 2016-06-15 ENCOUNTER — Ambulatory Visit (HOSPITAL_COMMUNITY): Payer: BLUE CROSS/BLUE SHIELD | Attending: Cardiology

## 2016-06-15 DIAGNOSIS — I080 Rheumatic disorders of both mitral and aortic valves: Secondary | ICD-10-CM | POA: Diagnosis not present

## 2016-06-15 DIAGNOSIS — I272 Pulmonary hypertension, unspecified: Secondary | ICD-10-CM | POA: Insufficient documentation

## 2016-06-15 DIAGNOSIS — I421 Obstructive hypertrophic cardiomyopathy: Secondary | ICD-10-CM | POA: Diagnosis not present

## 2016-06-22 ENCOUNTER — Other Ambulatory Visit: Payer: BLUE CROSS/BLUE SHIELD

## 2016-06-22 DIAGNOSIS — I472 Ventricular tachycardia, unspecified: Secondary | ICD-10-CM

## 2016-06-22 DIAGNOSIS — Z01812 Encounter for preprocedural laboratory examination: Secondary | ICD-10-CM

## 2016-06-22 DIAGNOSIS — I421 Obstructive hypertrophic cardiomyopathy: Secondary | ICD-10-CM

## 2016-06-22 LAB — CBC WITH DIFFERENTIAL/PLATELET
Basophils Absolute: 0 10*3/uL (ref 0.0–0.2)
Basos: 0 %
EOS (ABSOLUTE): 0.1 10*3/uL (ref 0.0–0.4)
Eos: 2 %
Hematocrit: 45 % (ref 37.5–51.0)
Hemoglobin: 15.5 g/dL (ref 13.0–17.7)
Immature Grans (Abs): 0 10*3/uL (ref 0.0–0.1)
Immature Granulocytes: 0 %
Lymphocytes Absolute: 2 10*3/uL (ref 0.7–3.1)
Lymphs: 30 %
MCH: 30.3 pg (ref 26.6–33.0)
MCHC: 34.4 g/dL (ref 31.5–35.7)
MCV: 88 fL (ref 79–97)
Monocytes Absolute: 0.5 10*3/uL (ref 0.1–0.9)
Monocytes: 7 %
Neutrophils Absolute: 3.9 10*3/uL (ref 1.4–7.0)
Neutrophils: 61 %
Platelets: 157 10*3/uL (ref 150–379)
RBC: 5.11 x10E6/uL (ref 4.14–5.80)
RDW: 14 % (ref 12.3–15.4)
WBC: 6.5 10*3/uL (ref 3.4–10.8)

## 2016-06-22 LAB — BASIC METABOLIC PANEL
BUN/Creatinine Ratio: 14 (ref 9–20)
BUN: 14 mg/dL (ref 6–24)
CO2: 23 mmol/L (ref 18–29)
Calcium: 9.2 mg/dL (ref 8.7–10.2)
Chloride: 104 mmol/L (ref 96–106)
Creatinine, Ser: 1 mg/dL (ref 0.76–1.27)
GFR calc Af Amer: 98 mL/min/{1.73_m2} (ref >59)
GFR calc non Af Amer: 84 mL/min/{1.73_m2} (ref >59)
Glucose: 79 mg/dL (ref 65–99)
Potassium: 4.5 mmol/L (ref 3.5–5.2)
Sodium: 143 mmol/L (ref 134–144)

## 2016-06-26 ENCOUNTER — Encounter (HOSPITAL_COMMUNITY): Payer: Self-pay | Admitting: *Deleted

## 2016-06-29 ENCOUNTER — Encounter (HOSPITAL_COMMUNITY): Payer: Self-pay | Admitting: Cardiology

## 2016-06-29 ENCOUNTER — Ambulatory Visit (HOSPITAL_COMMUNITY)
Admission: RE | Disposition: A | Discharge status: Home / Self Care | Payer: Self-pay | Source: Ambulatory Visit | Attending: Internal Medicine

## 2016-06-29 ENCOUNTER — Ambulatory Visit (HOSPITAL_COMMUNITY)
Admission: RE | Admit: 2016-06-29 | Discharge: 2016-06-29 | Disposition: A | Discharge status: Home / Self Care | Payer: BLUE CROSS/BLUE SHIELD | Source: Ambulatory Visit | Attending: Internal Medicine | Admitting: Internal Medicine

## 2016-06-29 DIAGNOSIS — Z4502 Encounter for adjustment and management of automatic implantable cardiac defibrillator: Secondary | ICD-10-CM | POA: Diagnosis not present

## 2016-06-29 DIAGNOSIS — I472 Ventricular tachycardia: Secondary | ICD-10-CM | POA: Insufficient documentation

## 2016-06-29 DIAGNOSIS — I272 Pulmonary hypertension, unspecified: Secondary | ICD-10-CM | POA: Diagnosis not present

## 2016-06-29 DIAGNOSIS — I421 Obstructive hypertrophic cardiomyopathy: Secondary | ICD-10-CM | POA: Diagnosis not present

## 2016-06-29 DIAGNOSIS — Z7901 Long term (current) use of anticoagulants: Secondary | ICD-10-CM | POA: Diagnosis not present

## 2016-06-29 DIAGNOSIS — I340 Nonrheumatic mitral (valve) insufficiency: Secondary | ICD-10-CM | POA: Diagnosis not present

## 2016-06-29 DIAGNOSIS — I422 Other hypertrophic cardiomyopathy: Secondary | ICD-10-CM | POA: Diagnosis present

## 2016-06-29 DIAGNOSIS — Z4509 Encounter for adjustment and management of other cardiac device: Secondary | ICD-10-CM | POA: Diagnosis not present

## 2016-06-29 DIAGNOSIS — I4901 Ventricular fibrillation: Secondary | ICD-10-CM | POA: Diagnosis present

## 2016-06-29 DIAGNOSIS — I481 Persistent atrial fibrillation: Secondary | ICD-10-CM | POA: Diagnosis not present

## 2016-06-29 DIAGNOSIS — Z95810 Presence of automatic (implantable) cardiac defibrillator: Secondary | ICD-10-CM | POA: Diagnosis present

## 2016-06-29 HISTORY — PX: ICD GENERATOR CHANGEOUT: EP1231

## 2016-06-29 HISTORY — PX: CARDIOVERSION: SHX1299

## 2016-06-29 LAB — SURGICAL PCR SCREEN
MRSA, PCR: NEGATIVE
Staphylococcus aureus: POSITIVE — AB

## 2016-06-29 SURGERY — ICD GENERATOR CHANGEOUT

## 2016-06-29 MED ORDER — FENTANYL CITRATE (PF) 100 MCG/2ML IJ SOLN
INTRAMUSCULAR | Status: AC
  Filled 2016-06-29: qty 2

## 2016-06-29 MED ORDER — SODIUM CHLORIDE 0.9 % IV SOLN: INTRAVENOUS | Status: AC

## 2016-06-29 MED ORDER — MIDAZOLAM HCL 5 MG/5ML IJ SOLN
INTRAMUSCULAR | Status: DC | PRN
  Administered 2016-06-29: 1 mg via INTRAVENOUS
  Administered 2016-06-29: 2 mg via INTRAVENOUS
  Administered 2016-06-29 (×2): 1 mg via INTRAVENOUS
  Administered 2016-06-29: 2 mg via INTRAVENOUS
  Administered 2016-06-29: 1 mg via INTRAVENOUS

## 2016-06-29 MED ORDER — SODIUM CHLORIDE 0.9 % IV SOLN
INTRAVENOUS | Status: DC
  Administered 2016-06-29: 07:00:00 via INTRAVENOUS

## 2016-06-29 MED ORDER — LIDOCAINE HCL (PF) 1 % IJ SOLN
INTRAMUSCULAR | Status: AC
  Filled 2016-06-29: qty 60

## 2016-06-29 MED ORDER — MIDAZOLAM HCL 5 MG/5ML IJ SOLN
INTRAMUSCULAR | Status: AC
  Filled 2016-06-29: qty 5

## 2016-06-29 MED ORDER — SODIUM CHLORIDE 0.9 % IR SOLN
80.0000 mg | Status: AC
  Administered 2016-06-29: 80 mg

## 2016-06-29 MED ORDER — LIDOCAINE HCL (PF) 1 % IJ SOLN
INTRAMUSCULAR | Status: DC | PRN
  Administered 2016-06-29: 45 mL

## 2016-06-29 MED ORDER — CEFAZOLIN SODIUM-DEXTROSE 2-4 GM/100ML-% IV SOLN
2.0000 g | INTRAVENOUS | Status: AC
  Administered 2016-06-29: 2 g via INTRAVENOUS

## 2016-06-29 MED ORDER — MUPIROCIN 2 % EX OINT: TOPICAL_OINTMENT | Freq: Once | CUTANEOUS | Status: DC

## 2016-06-29 MED ORDER — CEFAZOLIN SODIUM-DEXTROSE 2-4 GM/100ML-% IV SOLN
INTRAVENOUS | Status: AC
  Filled 2016-06-29: qty 100

## 2016-06-29 MED ORDER — SODIUM CHLORIDE 0.9 % IR SOLN
Status: AC
  Filled 2016-06-29: qty 2

## 2016-06-29 MED ORDER — LOSARTAN POTASSIUM 25 MG PO TABS: 25.0000 mg | ORAL_TABLET | Freq: Every day | ORAL | 12 refills | Status: DC

## 2016-06-29 MED ORDER — FENTANYL CITRATE (PF) 100 MCG/2ML IJ SOLN
INTRAMUSCULAR | Status: DC | PRN
  Administered 2016-06-29 (×2): 25 ug via INTRAVENOUS
  Administered 2016-06-29: 50 ug via INTRAVENOUS
  Administered 2016-06-29 (×2): 25 ug via INTRAVENOUS

## 2016-06-29 MED ORDER — ONDANSETRON HCL 4 MG/2ML IJ SOLN: 4.0000 mg | Freq: Four times a day (QID) | INTRAMUSCULAR | Status: DC | PRN

## 2016-06-29 MED ORDER — LOSARTAN POTASSIUM 25 MG PO TABS
25.0000 mg | ORAL_TABLET | Freq: Every day | ORAL | Status: DC
  Administered 2016-06-29: 25 mg via ORAL
  Filled 2016-06-29: qty 1

## 2016-06-29 MED ORDER — MUPIROCIN 2 % EX OINT
TOPICAL_OINTMENT | CUTANEOUS | Status: AC
  Filled 2016-06-29: qty 22

## 2016-06-29 MED ORDER — ACETAMINOPHEN 325 MG PO TABS
325.0000 mg | ORAL_TABLET | ORAL | Status: DC | PRN
  Filled 2016-06-29: qty 2

## 2016-06-29 SURGICAL SUPPLY — 8 items
CABLE SURGICAL S-101-97-12 (CABLE) ×1 IMPLANT
DEFIB ICD DYNAGEN EL VR SNGL (ICD Generator) ×3 IMPLANT
DEFIBRILATOR ICD DYNGN VR SNGL (ICD Generator) IMPLANT
HEMOSTAT SURGICEL 2X4 FIBR (HEMOSTASIS) ×2 IMPLANT
PAD DEFIB LIFELINK (PAD) ×2 IMPLANT
POUCH AIGIS-R ANTIBACT ICD (Mesh General) ×3 IMPLANT
POUCH AIGIS-R ANTIBACT ICD LRG (Mesh General) IMPLANT
TRAY PACEMAKER INSERTION (CUSTOM PROCEDURE TRAY) ×1 IMPLANT

## 2016-06-29 NOTE — H&P (Addendum)
ICD Criteria  Current LVEF:60%. Within 12 months prior to implant: Yes   Heart failure history: No  Cardiomyopathy history: No.  Atrial Fibrillation/Atrial Flutter: Yes, Persistent (> 7 Days).  Ventricular tachycardia history: Yes, No hemodynamic instability. VT Type: Sustained Ventricular Tachycardia - Monomorphic.  Cardiac arrest history: No.  History of syndromes with risk of sudden death: No.  Previous ICD: Yes, Reason for ICD:  Primary prevention.  Current ICD indication: Secondary  PPM indication: No.   Class I or II Bradycardia indication present: No  Beta Blocker therapy for 3 or more months: Yes, prescribed.   Ace Inhibitor/ARB therapy for 3 or more months: No, medical reason.       Patient Care Team: [facility] as PCP - General (Family Medicine)   HPI  Donald Bond is a 56 y.o. male Admitted for ICD generator replacement for 2 prevention in the setting of HCM and persistent atrial fibrillation   functional status stable without chest pain no edema; no PND orthopnea   4/18 Echo Normal LV function with severe LAE; moderate MR  Mod pulm HTN   Sleep study pending    Past Medical History:  Diagnosis Date  . Hypertr obst cardiomyop   . ICD--[employer] Judes    Dr. Graciela Husbands  . Orthostatic lightheadedness   . SYNCOPE   . TIREDNESS   . Ventricular tachycardia (HCC)    rx via ICD    Past Surgical History:  Procedure Laterality Date  . CHOLECYSTECTOMY     lap 2003  . Guidant Vitality ICD Impantation--Guidant Vitality T135--11/13/2003    . HYDROCELE EXCISION / REPAIR Left feb 2015    Current Facility-Administered Medications  Medication Dose Route Frequency Provider Last Rate Last Dose  . 0.9 %  sodium chloride infusion   Intravenous Continuous Duke Salvia, MD 50 mL/hr at 06/29/16 [phone]    . ceFAZolin (ANCEF) IVPB 2g/100 mL premix  2 g Intravenous On Call Duke Salvia, MD      . gentamicin (GARAMYCIN) 80 mg in sodium  chloride irrigation 0.9 % 500 mL irrigation  80 mg Irrigation On Call Duke Salvia, MD      . mupirocin ointment Idelle Jo) 2 %   Nasal Once Duke Salvia, MD      . mupirocin ointment (BACTROBAN) 2 %             No Known Allergies    Social History  Substance Use Topics  . Smoking status: Never Smoker  . Smokeless tobacco: Never Used  . Alcohol use Yes     Comment: occasional      Family History  Problem Relation Age of Onset  . ALS Father   . Hypertension    . Heart attack Brother      No current facility-administered medications on file prior to encounter.    Current Outpatient Prescriptions on File Prior to Encounter  Medication Sig Dispense Refill  . apixaban (ELIQUIS) 5 MG TABS tablet Take 1 tablet (5 mg total) by mouth 2 (two) times daily. 60 tablet 11  . atenolol (TENORMIN) 25 MG tablet Take 2 tablets (50 mg total) by mouth daily. (Patient taking differently: Take 50 mg by mouth every evening. ) 180 tablet 3  . ibuprofen (ADVIL,MOTRIN) 200 MG tablet Take 600 mg by mouth every 4 (four) hours as needed for headache or moderate pain.     Marland Kitchen loratadine (CLARITIN) 10 MG tablet Take 10 mg by mouth daily as needed for  allergies.         Review of Systems negative except from HPI and PMH  Physical Exam  137/94 88  Well developed and well nourished in no acute distress HENT normal E scleral and icterus clear Neck Supple JVP flat; carotids brisk and full Clear to ausculation IRRegular rate and rhythm, no murmurs gallops or rub Soft with active bowel sounds No clubbing cyanosis  Edema Alert and oriented, grossly normal motor and sensory function Skin Warm and Dry  ECG  Afib 88 LBBB  Assessment and  Plan  HCM  Afib persistent   MR moderate /Pulm HTN  Ventricular tachycardia   The pt is here today for ICD generator change and will undertake temporally assoc cardioversion for his persistent atrial fib  With his MR and aFib and Pulm HTN will need to  explore his Mitral valve and whether it should be repaired./ has sleep study scheduled and will plan outpt TEE   I will discuss with Dr Regino Schultze as to possible referral at [facility]   If he is unable to sustain sinus, and not unexpected given his LA size, would utilize dofetilide

## 2016-06-29 NOTE — Progress Notes (Signed)
Patient being monitored post ICD generator change and cardioversion.  Patient's VS remained stable and patient is alert and talking.  Patient to be transported to procedural short stay and report will be given at the bedside.

## 2016-06-29 NOTE — Discharge Instructions (Signed)
Mupirocin instruction sheet given  Pacemaker Battery Change, Care After This sheet gives you information about how to care for yourself after your procedure. Your health care provider may also give you more specific instructions. If you have problems or questions, contact your health care provider. What can I expect after the procedure? After your procedure, it is common to have:  Pain or soreness at the site where the pacemaker was inserted.  Swelling at the site where the pacemaker was inserted. Follow these instructions at home: Incision care   Keep the incision clean and dry.  Do not take baths, swim, or use a hot tub until your health care provider approves.  You may shower the day after your procedure, or as directed by your health care provider.  Pat the area dry with a clean towel. Do not rub the area. This may cause bleeding.  Follow instructions from your health care provider about how to take care of your incision. Make sure you:  Wash your hands with soap and water before you change your bandage (dressing). If soap and water are not available, use hand sanitizer.  Change your dressing as told by your health care provider.  Leave stitches (sutures), skin glue, or adhesive strips in place. These skin closures may need to stay in place for 2 weeks or longer. If adhesive strip edges start to loosen and curl up, you may trim the loose edges. Do not remove adhesive strips completely unless your health care provider tells you to do that.  Check your incision area every day for signs of infection. Check for:  More redness, swelling, or pain.  More fluid or blood.  Warmth.  Pus or a bad smell. Activity   Do not lift anything that is heavier than 10 lb (4.5 kg) until your health care provider says it is okay to do so.  For the first 2 weeks, or as long as told by your health care provider:  Avoid lifting your left arm higher than your shoulder.  Be gentle when you  move your arms over your head. It is okay to raise your arm to comb your hair.  Avoid strenuous exercise.  Ask your health care provider when it is okay to:  Resume your normal activities.  Return to work or school.  Resume sexual activity. Eating and drinking   Eat a heart-healthy diet. This should include plenty of fresh fruits and vegetables, whole grains, low-fat dairy products, and lean protein like chicken and fish.  Limit alcohol intake to no more than 1 drink a day for non-pregnant women and 2 drinks a day for men. One drink equals 12 oz of beer, 5 oz of wine, or 1 oz of hard liquor.  Check ingredients and nutrition facts on packaged foods and beverages. Avoid the following types of food:  Food that is high in salt (sodium).  Food that is high in saturated fat, like full-fat dairy or red meat.  Food that is high in trans fat, like fried food.  Food and drinks that are high in sugar. Lifestyle   Do not use any products that contain nicotine or tobacco, such as cigarettes and e-cigarettes. If you need help quitting, ask your health care provider.  Take steps to manage and control your weight.  Get regular exercise. Aim for 150 minutes of moderate-intensity exercise (such as walking or yoga) or 75 minutes of vigorous exercise (such as running or swimming) each week.  Manage other health problems, such as  diabetes or high blood pressure. Ask your health care provider how you can manage these conditions. General instructions   Do not drive for 24 hours after your procedure if you were given a medicine to help you relax (sedative).  Take over-the-counter and prescription medicines only as told by your health care provider.  Avoid putting pressure on the area where the pacemaker was placed.  If you need an MRI after your pacemaker has been placed, be sure to tell the health care provider who orders the MRI that you have a pacemaker.  Avoid close and prolonged exposure  to electrical devices that have strong magnetic fields. These include:  Cell phones. Avoid keeping them in a pocket near the pacemaker, and try using the ear opposite the pacemaker.  MP3 players.  Household appliances, like microwaves.  Metal detectors.  Electric generators.  High-tension wires.  Keep all follow-up visits as directed by your health care provider. This is important. Contact a health care provider if:  You have pain at the incision site that is not relieved by over-the-counter or prescription medicines.  You have any of these around your incision site or coming from it:  More redness, swelling, or pain.  Fluid or blood.  Warmth to the touch.  Pus or a bad smell.  You have a fever.  You feel brief, occasional palpitations, light-headedness, or any symptoms that you think might be related to your heart. Get help right away if:  You experience chest pain that is different from the pain at the pacemaker site.  You develop a red streak that extends above or below the incision site.  You experience shortness of breath.  You have palpitations or an irregular heartbeat.  You have light-headedness that does not go away quickly.  You faint or have dizzy spells.  Your pulse suddenly drops or increases rapidly and does not return to normal.  You begin to gain weight and your legs and ankles swell. Summary  After your procedure, it is common to have pain, soreness, and some swelling where the pacemaker was inserted.  Make sure to keep your incision clean and dry. Follow instructions from your health care provider about how to take care of your incision.  Check your incision every day for signs of infection, such as more pain or swelling, pus or a bad smell, warmth, or leaking fluid and blood.  Avoid strenuous exercise and lifting your left arm higher than your shoulder for 2 weeks, or as long as told by your health care provider. This information is not  intended to replace advice given to you by your health care provider. Make sure you discuss any questions you have with your health care provider. Document Released: 12/07/2012 Document Revised: 01/09/2016 Document Reviewed: 01/09/2016 Elsevier Interactive Patient Education  2017 Elsevier Inc.     Tissue Adhesive Wound Care Some cuts and wounds can be closed with skin glue (tissue adhesive). Skin glue holds the skin together and helps your wound heal faster. Skin glue goes away on its own as your wound gets better. Follow these instructions at home: Wound care    Showers are allowed 24 hours after treatment. Do not soak the wound in water. Do not take baths, swim, or use hot tubs. Do not use soaps or creams on your wound.  If a bandage (dressing) was put on the wound:  Wash your hands with soap and water before you change your bandage.  Change the bandage as often as told by  your doctor.  Leave skin glue in place. It will fall off on its own after 7-10 days.  Keep the bandage dry.  Do not scratch, rub, or pick at the skin glue.  Do not put tape over the skin glue. The skin glue could come off when you take the tape off.  Protect the wound from another injury.  Protect the wound from sun and tanning beds. General instructions   Take over-the-counter and prescription medicines only as told by your doctor.  Keep all follow-up visits as told by your doctor. This is important. Get help right away if:  Your wound is red, puffy (swollen), hot, or tender.  You get a rash after the glue is put on.  You have more pain in the wound.  You have a red streak going away from the wound.  You have yellowish-white fluid (pus) coming from the wound.  You have more bleeding.  You have a fever.  You have chills and you start to shake.  You notice a bad smell coming from the wound.  Your wound or skin glue breaks open. This information is not intended to replace advice given to  you by your health care provider. Make sure you discuss any questions you have with your health care provider. Document Released: 11/26/2007 Document Revised: 01/10/2016 Document Reviewed: 01/10/2016 Elsevier Interactive Patient Education  2017 Elsevier Inc.    Losartan tablets What is this medicine? LOSARTAN (loe SAR tan) is used to treat high blood pressure and to reduce the risk of stroke in certain patients. This drug also slows the progression of kidney disease in patients with diabetes. This medicine may be used for other purposes; ask your health care provider or pharmacist if you have questions. COMMON BRAND NAME(S): Cozaar What should I tell my health care provider before I take this medicine? They need to know if you have any of these conditions: -heart failure -kidney or liver disease -an unusual or allergic reaction to losartan, other medicines, foods, dyes, or preservatives -pregnant or trying to get pregnant -breast-feeding How should I use this medicine? Take this medicine by mouth with a glass of water. Follow the directions on the prescription label. This medicine can be taken with or without food. Take your doses at regular intervals. Do not take your medicine more often than directed. Talk to your pediatrician regarding the use of this medicine in children. Special care may be needed. Overdosage: If you think you have taken too much of this medicine contact a poison control center or emergency room at once. NOTE: This medicine is only for you. Do not share this medicine with others. What if I miss a dose? If you miss a dose, take it as soon as you can. If it is almost time for your next dose, take only that dose. Do not take double or extra doses. What may interact with this medicine? -blood pressure medicines -diuretics, especially triamterene, spironolactone, or amiloride -fluconazole -NSAIDs, medicines for pain and inflammation, like ibuprofen or  naproxen -potassium salts or potassium supplements -rifampin This list may not describe all possible interactions. Give your health care provider a list of all the medicines, herbs, non-prescription drugs, or dietary supplements you use. Also tell them if you smoke, drink alcohol, or use illegal drugs. Some items may interact with your medicine. What should I watch for while using this medicine? Visit your doctor or health care professional for regular checks on your progress. Check your blood pressure as directed. Ask  your doctor or health care professional what your blood pressure should be and when you should contact him or her. Call your doctor or health care professional if you notice an irregular or fast heart beat. Women should inform their doctor if they wish to become pregnant or think they might be pregnant. There is a potential for serious side effects to an unborn child, particularly in the second or third trimester. Talk to your health care professional or pharmacist for more information. You may get drowsy or dizzy. Do not drive, use machinery, or do anything that needs mental alertness until you know how this drug affects you. Do not stand or sit up quickly, especially if you are an older patient. This reduces the risk of dizzy or fainting spells. Alcohol can make you more drowsy and dizzy. Avoid alcoholic drinks. Avoid salt substitutes unless you are told otherwise by your doctor or health care professional. Do not treat yourself for coughs, colds, or pain while you are taking this medicine without asking your doctor or health care professional for advice. Some ingredients may increase your blood pressure. What side effects may I notice from receiving this medicine? Side effects that you should report to your doctor or health care professional as soon as possible: -confusion, dizziness, light headedness or fainting spells -decreased amount of urine passed -difficulty breathing or  swallowing, hoarseness, or tightening of the throat -fast or irregular heart beat, palpitations, or chest pain -skin rash, itching -swelling of your face, lips, tongue, hands, or feet Side effects that usually do not require medical attention (report to your doctor or health care professional if they continue or are bothersome): -cough -decreased sexual function or desire -headache -nasal congestion or stuffiness -nausea or stomach pain -sore or cramping muscles This list may not describe all possible side effects. Call your doctor for medical advice about side effects. You may report side effects to FDA at 1-800-FDA-1088. Where should I keep my medicine? Keep out of the reach of children. Store at room temperature between 15 and 30 degrees C (59 and 86 degrees F). Protect from light. Keep container tightly closed. Throw away any unused medicine after the expiration date. NOTE: This sheet is a summary. It may not cover all possible information. If you have questions about this medicine, talk to your doctor, pharmacist, or health care provider.  2018 Elsevier/Gold Standard (2007-04-29 16:42:18)

## 2016-06-30 MED FILL — Cefazolin Sodium-Dextrose IV Solution 2 GM/100ML-4%: INTRAVENOUS | Qty: 100 | Status: AC

## 2016-06-30 MED FILL — Sodium Chloride Irrigation Soln 0.9%: Qty: 500 | Status: AC

## 2016-07-02 ENCOUNTER — Telehealth: Payer: Self-pay

## 2016-07-02 NOTE — Telephone Encounter (Signed)
Left detailed message of normal results on patients mobile voicemail.

## 2016-07-06 ENCOUNTER — Telehealth: Payer: Self-pay | Admitting: Cardiology

## 2016-07-06 NOTE — Telephone Encounter (Signed)
New message   Pt states he receieved a letter about a sleep study but states that his insurance would not pay for it and he wants to know what other options he has for the sleep study

## 2016-07-06 NOTE — Telephone Encounter (Signed)
Patient called today stating he received a letter in the mail that his in lab sleep study was denied by his insurance [employer]) because the doctor didn't mention he had along term history that his heart could not pump enough blood to other organs. So in lab study was not medically necessary.  In home sleep study ok?

## 2016-07-07 NOTE — Telephone Encounter (Signed)
Fine for home sleep study

## 2016-07-07 NOTE — Telephone Encounter (Signed)
Patient understands his in lab sleep study has been denied. Patient understands he qualifies for a in home sleep study. Patient agrees to treatment plan. Patient will be set up for in home study with [facility]. Patient understands he will be contacted by [facility] for new set up. Patient understands to call if [facility] does not contact him in a timely manner. Order placed to [facility] ESS=7

## 2016-07-13 ENCOUNTER — Ambulatory Visit (INDEPENDENT_AMBULATORY_CARE_PROVIDER_SITE_OTHER): Payer: BLUE CROSS/BLUE SHIELD | Admitting: *Deleted

## 2016-07-13 DIAGNOSIS — I421 Obstructive hypertrophic cardiomyopathy: Secondary | ICD-10-CM

## 2016-07-13 LAB — CUP PACEART INCLINIC DEVICE CHECK
Date Time Interrogation Session: 20180514040000
HighPow Impedance: 47 Ohm
Implantable Lead Implant Date: 20000815
Implantable Lead Location: 753860
Implantable Lead Model: 144
Implantable Lead Serial Number: 334759
Implantable Pulse Generator Implant Date: 20180430
Lead Channel Impedance Value: 815 Ohm
Lead Channel Pacing Threshold Amplitude: 1 V
Lead Channel Pacing Threshold Pulse Width: 0.4 ms
Lead Channel Sensing Intrinsic Amplitude: 24.2 mV
Lead Channel Setting Pacing Amplitude: 2.5 V
Lead Channel Setting Pacing Pulse Width: 0.4 ms
Lead Channel Setting Sensing Sensitivity: 0.5 mV
Pulse Gen Serial Number: 198978

## 2016-07-13 NOTE — Progress Notes (Signed)
Wound defib check. Dermabond removed. Wound without redness or edema. Incision edges approximated. Wound well healed. Normal device function. Threshold and sensing consistent with previous device measurements. Impedance trends stable over time. (4) ventricular high rate episodes, max dur. 14sec, irregular V-V, Vs correlated. + Eliquis. Histogram distribution appropriate for patient and level of activity. No changes made this session. Device programmed at appropriate safety margins. Device programmed to optimize intrinsic conduction. Estimated longevity 12 years. Patient education completed including shock plan, wound care, and arm mobility. Patient will follow up with SK in 3 months.

## 2016-07-14 ENCOUNTER — Telehealth: Payer: Self-pay | Admitting: *Deleted

## 2016-07-14 NOTE — Telephone Encounter (Signed)
Call came in today from [place] [facility] for the patient. She asked for verbal consent to authorize his home sleep study for witnessed apneic events or daytime sleepiness. In Dr Odessa Fleming 06/29/2016 note he states daytime somnolence. Enid Derry said that would work to set up his sleep study.

## 2016-07-15 NOTE — Telephone Encounter (Signed)
Received fax from [facility] requesting copy of last office notes Per patients insurance for patient to confirm the patients condition supports the need for a home sleep study. Records dated  (05/29/2016) faxed  to [facility] at [phone]. Home sleep test scheduled for 07/21/16.

## 2016-07-19 ENCOUNTER — Encounter (HOSPITAL_BASED_OUTPATIENT_CLINIC_OR_DEPARTMENT_OTHER): Payer: BLUE CROSS/BLUE SHIELD

## 2016-07-28 ENCOUNTER — Encounter: Payer: BLUE CROSS/BLUE SHIELD | Admitting: Internal Medicine

## 2016-09-01 ENCOUNTER — Telehealth: Payer: Self-pay | Admitting: *Deleted

## 2016-09-01 NOTE — Telephone Encounter (Signed)
Fax came in from [facility] for patient stating his returned Home Sleep test had no data or insufficient data. CPAP assistant reached out to [facility] to inquire what happened and was informed by [facility] that the patient had some issues with his test. [facility]) says she will reach back out to the patient. CPAP assistant Coralee North) reached out to the patient and was informed by the patient that he had issues with the sensors picking up his breathing causing the alarms to go off every 30 minutes. The patient states he called [facility] 2-3 times with issues. [facility] states the test was returned incomplete. Patient is not interested in retaking the test.

## 2016-10-30 ENCOUNTER — Encounter: Payer: BLUE CROSS/BLUE SHIELD | Admitting: Internal Medicine

## 2016-11-04 ENCOUNTER — Encounter: Payer: Self-pay | Admitting: *Deleted

## 2016-11-04 ENCOUNTER — Ambulatory Visit (INDEPENDENT_AMBULATORY_CARE_PROVIDER_SITE_OTHER): Payer: BLUE CROSS/BLUE SHIELD | Admitting: Internal Medicine

## 2016-11-04 ENCOUNTER — Encounter: Payer: Self-pay | Admitting: Internal Medicine

## 2016-11-04 VITALS — BP 124/82 | HR 76 | Ht 67.0 in | Wt 189.4 lb

## 2016-11-04 DIAGNOSIS — I472 Ventricular tachycardia, unspecified: Secondary | ICD-10-CM

## 2016-11-04 DIAGNOSIS — Z95810 Presence of automatic (implantable) cardiac defibrillator: Secondary | ICD-10-CM

## 2016-11-04 DIAGNOSIS — I10 Essential (primary) hypertension: Secondary | ICD-10-CM | POA: Insufficient documentation

## 2016-11-04 NOTE — Patient Instructions (Addendum)
Medication Instructions:  Your physician recommends that you continue on your current medications as directed. Please refer to the Current Medication list given to you today.   Labwork: CBC, BMET  Testing/Procedures: None ordered  Follow-Up: Remote monitoring is used to monitor your ICD from home. This monitoring reduces the number of office visits required to check your device to one time per year. It allows [place] to keep an eye on the functioning of your device to ensure it is working properly. You are scheduled for a device check from home on 02/03/17. You may send your transmission at any time that day. If you have a wireless device, the transmission will be sent automatically. After your physician reviews your transmission, you will receive a postcard with your next transmission date.    Your physician wants you to follow-up in: 6 months with Francis Dowse. You will receive a reminder letter in the mail two months in advance. If you don't receive a letter, please call our office to schedule the follow-up appointment.   Any Other Special Instructions Will Be Listed Below (If Applicable).     If you need a refill on your cardiac medications before your next appointment, please call your pharmacy.

## 2016-11-04 NOTE — Progress Notes (Signed)
Patient Care Team: Medicine, [facility] as PCP - General (Family Medicine)   HPI  Donald Bond is a 56 y.o. male Seen in followup for HCM associated with syncope and previously treated ventricular tachycardia;  He has had 3 previous devices.  Underwent gen change 4/4:30. 2018 w AEGIS pouch support.   He is persistent afib; on apixoban . He underwent cardioversion at the time of his device change He has no awareness of palpitations or changes in exercise tolerance.  The patient denies chest pain, shortness of breath, nocturnal dyspnea, orthopnea or peripheral edema.     DATE TEST    4/18    Echo   EF normal % LAE (49/2.4/70)                 Past Medical History:  Diagnosis Date  . Hypertr obst cardiomyop   . ICD--[employer]    Dr. Graciela Husbands  . Orthostatic lightheadedness   . SYNCOPE   . TIREDNESS   . Ventricular tachycardia (HCC)    rx via ICD    Past Surgical History:  Procedure Laterality Date  . CARDIOVERSION  06/29/2016   Procedure: Cardioversion;  Surgeon: Duke Salvia, MD;  Location: [facility] INVASIVE CV LAB;  Service: Cardiovascular;;  . CHOLECYSTECTOMY     lap 2003  . Guidant Vitality ICD Impantation--Guidant Vitality T135--11/13/2003    . HYDROCELE EXCISION / REPAIR Left feb 2015  . ICD GENERATOR CHANGEOUT N/A 06/29/2016   Procedure: ICD Generator Changeout;  Surgeon: Duke Salvia, MD;  Location: [facility] INVASIVE CV LAB;  Service: Cardiovascular;  Laterality: N/A;    Current Outpatient Prescriptions  Medication Sig Dispense Refill  . apixaban (ELIQUIS) 5 MG TABS tablet Take 1 tablet (5 mg total) by mouth 2 (two) times daily. 60 tablet 11  . Coenzyme Q10 (COQ10) 200 MG CAPS Take 200 mg by mouth daily.    Marland Kitchen ibuprofen (ADVIL,MOTRIN) 200 MG tablet Take 600 mg by mouth every 4 (four) hours as needed for headache or moderate pain.     . Ibuprofen-Diphenhydramine Cit (ADVIL PM PO) Take 2 tablets by mouth at bedtime as needed (sleep).    .  loratadine (CLARITIN) 10 MG tablet Take 10 mg by mouth daily as needed for allergies.     Marland Kitchen losartan (COZAAR) 25 MG tablet Take 1 tablet (25 mg total) by mouth daily. 25 tablet 12  . atenolol (TENORMIN) 25 MG tablet Take 2 tablets (50 mg total) by mouth daily. (Patient taking differently: Take 50 mg by mouth every evening. ) 180 tablet 3   No current facility-administered medications for this visit.     No Known Allergies  Review of Systems negative except from HPI and PMH  Physical Exam BP 124/82   Pulse 76   Ht 5\' 7"  (1.702 m)   Wt 189 lb 6.4 oz (85.9 kg)   SpO2 96%   BMI 29.66 kg/m  Well developed and nourished in no acute distress HENT normal Neck supple with JVP-flat Clear Regular rate and rhythm, no murmurs or gallops Abd-soft with active BS No Clubbing cyanosis edema Skin-warm and dry A & Oriented  Grossly normal sensory and motor function  ECG personally reviewed atrial fibrillation 76 Intervals-/15/15 Left bundle branch block PVC   Assessment and plan  Hypertrophic cardio myopathy largely asymptomatic we'll continue him on his current medications  Ventricular tachycardia with appropriate therapy    Atrial Fibrillation-permanent  PVCs  Sleep disordered breathing and daytime  somnolence  Status post ICD implantation-[employer]  The patient's device was interrogated.  The information was reviewed. No changes were made in the programming.    Has had no intercurrent ventricular tachycardia   He has developed atrial fibrillation which is largely aymptomatic. He is on apixoban;  LAE was > 70  I will check with JA but think little role for rhythm control  PVCs are asymptomatic and as such will follow Echo for LV function as that would be the indication for intervention in the absence of symptoms  On Anticoagulation;  No bleeding issues   Will check Cr and CBC on apixoban

## 2016-11-05 LAB — BASIC METABOLIC PANEL
BUN/Creatinine Ratio: 18 (ref 9–20)
BUN: 17 mg/dL (ref 6–24)
CO2: 23 mmol/L (ref 20–29)
Calcium: 9.6 mg/dL (ref 8.7–10.2)
Chloride: 102 mmol/L (ref 96–106)
Creatinine, Ser: 0.95 mg/dL (ref 0.76–1.27)
GFR calc Af Amer: 104 mL/min/{1.73_m2} (ref >59)
GFR calc non Af Amer: 90 mL/min/{1.73_m2} (ref >59)
Glucose: 91 mg/dL (ref 65–99)
Potassium: 4.3 mmol/L (ref 3.5–5.2)
Sodium: 141 mmol/L (ref 134–144)

## 2016-11-05 LAB — CUP PACEART INCLINIC DEVICE CHECK
Brady Statistic RV Percent Paced: 1 % — CL
Date Time Interrogation Session: 20180905040000
HighPow Impedance: 49 Ohm
Implantable Lead Implant Date: 20000815
Implantable Lead Location: 753860
Implantable Lead Model: 144
Implantable Lead Serial Number: 334759
Implantable Pulse Generator Implant Date: 20180430
Lead Channel Impedance Value: 840 Ohm
Lead Channel Pacing Threshold Amplitude: 0.8 V
Lead Channel Pacing Threshold Pulse Width: 0.4 ms
Lead Channel Sensing Intrinsic Amplitude: 25 mV
Lead Channel Setting Pacing Amplitude: 2.5 V
Lead Channel Setting Pacing Pulse Width: 0.4 ms
Lead Channel Setting Sensing Sensitivity: 0.5 mV
Pulse Gen Serial Number: 198978

## 2016-11-05 LAB — CBC
Hematocrit: 45.1 % (ref 37.5–51.0)
Hemoglobin: 15.5 g/dL (ref 13.0–17.7)
MCH: 31.4 pg (ref 26.6–33.0)
MCHC: 34.4 g/dL (ref 31.5–35.7)
MCV: 92 fL (ref 79–97)
Platelets: 213 10*3/uL (ref 150–379)
RBC: 4.93 x10E6/uL (ref 4.14–5.80)
RDW: 13.9 % (ref 12.3–15.4)
WBC: 8.6 10*3/uL (ref 3.4–10.8)

## 2017-02-01 ENCOUNTER — Telehealth: Payer: Self-pay | Admitting: *Deleted

## 2017-02-01 MED ORDER — LOSARTAN POTASSIUM 25 MG PO TABS: 25.0000 mg | ORAL_TABLET | Freq: Every day | ORAL | 12 refills | Status: DC

## 2017-02-01 MED ORDER — ATENOLOL 25 MG PO TABS: 75.0000 mg | ORAL_TABLET | Freq: Every day | ORAL | 3 refills | Status: DC

## 2017-02-01 NOTE — Telephone Encounter (Signed)
Reviewed with Dr. Graciela Husbands- he agrees that the episode is AF with RVR. He recommends increasing atenolol to 75mg  daily- pt agreeable. Mr. Ranke requests a new Rx for losartan- rx sent for 25 tabs with 12 refills and he will come up short for a month's supply. I will send in to [facility] in [place].

## 2017-02-01 NOTE — Telephone Encounter (Signed)
Mr. Donald Bond doesn't recall what he was doing or feeling symptomatic yesterday at the time of the episode. I am unsure if this is true VT, questionable AF RVR. I will review with SK and call the patient back with any recommendations. He reports that he woke up around 5 this morning with a bad cramp in the back of his leg, I have advised him to stay hydrated.

## 2017-02-01 NOTE — Telephone Encounter (Signed)
LMOM to return call to [facility] regarding alert from ICD home monitor.

## 2017-02-03 ENCOUNTER — Ambulatory Visit (INDEPENDENT_AMBULATORY_CARE_PROVIDER_SITE_OTHER): Payer: BLUE CROSS/BLUE SHIELD | Admitting: *Deleted

## 2017-02-03 DIAGNOSIS — I421 Obstructive hypertrophic cardiomyopathy: Secondary | ICD-10-CM

## 2017-02-03 LAB — CUP PACEART REMOTE DEVICE CHECK
Battery Remaining Longevity: 144 mo
Battery Remaining Percentage: 100 %
Brady Statistic RV Percent Paced: 0 %
Date Time Interrogation Session: 20181205090100
HighPow Impedance: 50 Ohm
Implantable Lead Implant Date: 20000815
Implantable Lead Location: 753860
Implantable Lead Model: 144
Implantable Lead Serial Number: 334759
Implantable Pulse Generator Implant Date: 20180430
Lead Channel Impedance Value: 869 Ohm
Lead Channel Pacing Threshold Amplitude: 0.8 V
Lead Channel Pacing Threshold Pulse Width: 0.4 ms
Lead Channel Setting Pacing Amplitude: 2.5 V
Lead Channel Setting Pacing Pulse Width: 0.4 ms
Lead Channel Setting Sensing Sensitivity: 0.5 mV
Pulse Gen Serial Number: 198978

## 2017-02-03 NOTE — Progress Notes (Signed)
Remote ICD transmission.   

## 2017-02-04 ENCOUNTER — Encounter: Payer: Self-pay | Admitting: Cardiology

## 2017-05-04 ENCOUNTER — Encounter: Payer: Self-pay | Admitting: Internal Medicine

## 2017-05-04 ENCOUNTER — Ambulatory Visit (INDEPENDENT_AMBULATORY_CARE_PROVIDER_SITE_OTHER): Payer: BLUE CROSS/BLUE SHIELD | Admitting: Internal Medicine

## 2017-05-04 VITALS — BP 102/80 | HR 76 | Ht 67.0 in | Wt 196.0 lb

## 2017-05-04 DIAGNOSIS — I472 Ventricular tachycardia, unspecified: Secondary | ICD-10-CM

## 2017-05-04 DIAGNOSIS — I4891 Unspecified atrial fibrillation: Secondary | ICD-10-CM

## 2017-05-04 DIAGNOSIS — Z95810 Presence of automatic (implantable) cardiac defibrillator: Secondary | ICD-10-CM | POA: Diagnosis not present

## 2017-05-04 LAB — CUP PACEART INCLINIC DEVICE CHECK
Brady Statistic RV Percent Paced: 1 % — CL
Date Time Interrogation Session: 20190305050000
HighPow Impedance: 48 Ohm
Implantable Lead Implant Date: 20000815
Implantable Lead Location: 753860
Implantable Lead Model: 144
Implantable Lead Serial Number: 334759
Implantable Pulse Generator Implant Date: 20180430
Lead Channel Impedance Value: 778 Ohm
Lead Channel Pacing Threshold Amplitude: 0.8 V
Lead Channel Pacing Threshold Pulse Width: 0.4 ms
Lead Channel Sensing Intrinsic Amplitude: 25 mV
Lead Channel Setting Pacing Amplitude: 2.5 V
Lead Channel Setting Pacing Pulse Width: 0.4 ms
Lead Channel Setting Sensing Sensitivity: 0.5 mV
Pulse Gen Serial Number: 198978

## 2017-05-04 NOTE — Progress Notes (Signed)
Patient Care Team: Medicine, [facility] as PCP - General (Family Medicine)   HPI  Donald Bond is a 57 y.o. male Seen in followup for HCM associated with syncope and previously treated ventricular tachycardia;  He has had 3 previous devices.  Underwent gen change 4/4:30. 2018 w AEGIS pouch support.   He is persistent afib; on apixoban . He underwent cardioversion at the time of his device change He has no awareness of palpitations or changes in exercise tolerance.  The patient denies chest pain, shortness of breath, nocturnal dyspnea, orthopnea or peripheral edema.     DATE TEST    4/18    Echo   EF normal % LAE (49/2.4/70)               He has had problems with Afib and RVR with reaching into (VF zone > 240 bpm)  Atenolol was increased   Unfortunately he has continued to have episodes detected by his device > 210/240   There is been a great deal of stress, he does not have specific complaints of chest pain or shortness of breath.  Aware of his atrial fibrillation.   On Anticoagulation;  No bleeding issues      Date Cr  K Mg Hgb  9/18 0.95 4.3  15.5             Past Medical History:  Diagnosis Date  . Hypertr obst cardiomyop   . ICD--[employer]    Dr. Graciela Husbands  . Orthostatic lightheadedness   . SYNCOPE   . TIREDNESS   . Ventricular tachycardia (HCC)    rx via ICD    Past Surgical History:  Procedure Laterality Date  . CARDIOVERSION  06/29/2016   Procedure: Cardioversion;  Surgeon: Duke Salvia, MD;  Location: [facility] INVASIVE CV LAB;  Service: Cardiovascular;;  . CHOLECYSTECTOMY     lap 2003  . Guidant Vitality ICD Impantation--Guidant Vitality T135--11/13/2003    . HYDROCELE EXCISION / REPAIR Left feb 2015  . ICD GENERATOR CHANGEOUT N/A 06/29/2016   Procedure: ICD Generator Changeout;  Surgeon: Duke Salvia, MD;  Location: [facility] INVASIVE CV LAB;  Service: Cardiovascular;  Laterality: N/A;    Current Outpatient Medications  Medication  Sig Dispense Refill  . apixaban (ELIQUIS) 5 MG TABS tablet Take 1 tablet (5 mg total) by mouth 2 (two) times daily. 60 tablet 11  . Coenzyme Q10 (COQ10) 200 MG CAPS Take 200 mg by mouth daily.    Marland Kitchen ibuprofen (ADVIL,MOTRIN) 200 MG tablet Take 600 mg by mouth every 4 (four) hours as needed for headache or moderate pain.     . Ibuprofen-Diphenhydramine Cit (ADVIL PM PO) Take 2 tablets by mouth at bedtime as needed (sleep).    . loratadine (CLARITIN) 10 MG tablet Take 10 mg by mouth daily as needed for allergies.     Marland Kitchen losartan (COZAAR) 25 MG tablet Take 1 tablet (25 mg total) by mouth daily. 30 tablet 12  . atenolol (TENORMIN) 25 MG tablet Take 3 tablets (75 mg total) by mouth daily. 270 tablet 3   No current facility-administered medications for this visit.     No Known Allergies  Review of Systems negative except from HPI and PMH  Physical Exam BP 102/80   Pulse 76   Ht 5\' 7"  (1.702 m)   Wt 196 lb (88.9 kg)   SpO2 97%   BMI 30.70 kg/m  Well developed and nourished in no acute distress  HENT normal Neck supple with JVP-flat Carotids brisk and full without bruits Clear Irregularly irregular rate and rhythm with controlled ventricular response, no murmurs or gallops Abd-soft with active BS without hepatomegaly No Clubbing cyanosis edema Skin-warm and dry A & Oriented  Grossly normal sensory and motor function   ECG personally  afib LBBB   Assessment and plan  Hypertrophic cardio myopathy    Ventricular tachycardia with appropriate therapy    Inappropriate therapy  Atrial Fibrillation-persistent long-term  PVCs  Sleep disordered breathing and daytime somnolence  Status post ICD implantation-[employer]. Jude  The patient's device was interrogated.  The information was reviewed. No changes were made in the programming.    I have reviewed his echocardiogram with Dr. Genene Churn; he thinks left atrium is moderately-severely enlarged but not severely so.  His QTC is within range.  I  suggested that we consider hospitalization for dofetilide initiation to see if we can restore rhythm and that this might augment the benefits of catheter ablation.  We will plan to do this when I return from [place]  He again has had inappropriate therapy with A. fib ranging greater than 240 bpm;  we will increase his atenolol from 75-100 mg daily.  We could also consider adding a low-dose calcium blocker   No intercurrent Ventricular tachycardia  More than 50% of 40  min was spent in counseling related to the above

## 2017-05-04 NOTE — Patient Instructions (Addendum)
Medication Instructions:  Your physician recommends that you continue on your current medications as directed. Please refer to the Current Medication list given to you today.  Labwork: Your physician recommends that you have lab work today  Testing/Procedures:  Tikosyn Admission   Follow-Up: Your physician recommends that you schedule a follow-up appointment in: Based off Tikosyn Admission  Remote monitoring is used to monitor your ICD from home. This monitoring reduces the number of office visits required to check your device to one time per year. It allows [place] to keep an eye on the functioning of your device to ensure it is working properly. You are scheduled for a device check from home on 05/05/2017. You may send your transmission at any time that day. If you have a wireless device, the transmission will be sent automatically. After your physician reviews your transmission, you will receive a postcard with your next transmission date.   Any Other Special Instructions Will Be Listed Below (If Applicable).     If you need a refill on your cardiac medications before your next appointment, please call your pharmacy.

## 2017-05-05 ENCOUNTER — Ambulatory Visit (INDEPENDENT_AMBULATORY_CARE_PROVIDER_SITE_OTHER): Payer: BLUE CROSS/BLUE SHIELD | Admitting: *Deleted

## 2017-05-05 DIAGNOSIS — I472 Ventricular tachycardia, unspecified: Secondary | ICD-10-CM

## 2017-05-05 LAB — CBC WITH DIFFERENTIAL/PLATELET
Basophils Absolute: 0 10*3/uL (ref 0.0–0.2)
Basos: 0 %
EOS (ABSOLUTE): 0.1 10*3/uL (ref 0.0–0.4)
Eos: 1 %
Hematocrit: 45 % (ref 37.5–51.0)
Hemoglobin: 15.7 g/dL (ref 13.0–17.7)
Immature Grans (Abs): 0 10*3/uL (ref 0.0–0.1)
Immature Granulocytes: 0 %
Lymphocytes Absolute: 2.5 10*3/uL (ref 0.7–3.1)
Lymphs: 32 %
MCH: 31.4 pg (ref 26.6–33.0)
MCHC: 34.9 g/dL (ref 31.5–35.7)
MCV: 90 fL (ref 79–97)
Monocytes Absolute: 0.8 10*3/uL (ref 0.1–0.9)
Monocytes: 10 %
Neutrophils Absolute: 4.5 10*3/uL (ref 1.4–7.0)
Neutrophils: 57 %
Platelets: 184 10*3/uL (ref 150–379)
RBC: 5 x10E6/uL (ref 4.14–5.80)
RDW: 13.3 % (ref 12.3–15.4)
WBC: 7.8 10*3/uL (ref 3.4–10.8)

## 2017-05-05 LAB — BASIC METABOLIC PANEL
BUN/Creatinine Ratio: 16 (ref 9–20)
BUN: 15 mg/dL (ref 6–24)
CO2: 24 mmol/L (ref 20–29)
Calcium: 9.5 mg/dL (ref 8.7–10.2)
Chloride: 103 mmol/L (ref 96–106)
Creatinine, Ser: 0.93 mg/dL (ref 0.76–1.27)
GFR calc Af Amer: 106 mL/min/{1.73_m2} (ref >59)
GFR calc non Af Amer: 91 mL/min/{1.73_m2} (ref >59)
Glucose: 87 mg/dL (ref 65–99)
Potassium: 4.8 mmol/L (ref 3.5–5.2)
Sodium: 140 mmol/L (ref 134–144)

## 2017-05-05 NOTE — Progress Notes (Signed)
Remote ICD transmission.   

## 2017-05-06 ENCOUNTER — Encounter: Payer: Self-pay | Admitting: Cardiology

## 2017-05-14 LAB — CUP PACEART REMOTE DEVICE CHECK
Battery Remaining Longevity: 144 mo
Battery Remaining Percentage: 100 %
Brady Statistic RV Percent Paced: 0 %
Date Time Interrogation Session: 20190306090100
HighPow Impedance: 47 Ohm
Implantable Lead Implant Date: 20000815
Implantable Lead Location: 753860
Implantable Lead Model: 144
Implantable Lead Serial Number: 334759
Implantable Pulse Generator Implant Date: 20180430
Lead Channel Impedance Value: 779 Ohm
Lead Channel Pacing Threshold Amplitude: 0.8 V
Lead Channel Pacing Threshold Pulse Width: 0.4 ms
Lead Channel Setting Pacing Amplitude: 2.5 V
Lead Channel Setting Pacing Pulse Width: 0.4 ms
Lead Channel Setting Sensing Sensitivity: 0.5 mV
Pulse Gen Serial Number: 198978

## 2017-05-25 ENCOUNTER — Telehealth: Payer: Self-pay | Admitting: Internal Medicine

## 2017-05-25 NOTE — Telephone Encounter (Signed)
Pt would like to speak to the RN about his Afib Clinic and his Ins.  Please give him a call back.

## 2017-05-26 ENCOUNTER — Telehealth: Payer: Self-pay | Admitting: Pharmacist

## 2017-05-26 NOTE — Telephone Encounter (Signed)
Pt stated he was ready for his Tikosyn admission.   Pt scheduled to arrive at AF Clinic 4/16 @ 0900 with Rudi Coco. Directions were given, pt verbalized understanding importance of taking his anti coagulant without missing any doses.   He had no additional questions.

## 2017-05-26 NOTE — Telephone Encounter (Signed)
Medication list reviewed in anticipation of upcoming Tikosyn initiation. Patient has Advil PM listed on his medications which contains Benadryl and will need to be discontinued 3 days prior to Tikosyn initiation. He is not taking any other contraindicated or QTc prolonging medications.   Patient is anticoagulated on Eliquis 5mg  BID on the appropriate dose. Please ensure that patient has not missed any anticoagulation doses in the 3 weeks prior to Tikosyn initiation.

## 2017-05-28 NOTE — Telephone Encounter (Signed)
Patient takes advil pm multiple times a week - instructed to talk with PCP if he needs a sleep aid once off the advil pm. Pt verbalized understanding.

## 2017-06-01 ENCOUNTER — Other Ambulatory Visit: Payer: Self-pay | Admitting: *Deleted

## 2017-06-01 MED ORDER — APIXABAN 5 MG PO TABS: 5.0000 mg | ORAL_TABLET | Freq: Two times a day (BID) | ORAL | 11 refills | Status: DC

## 2017-06-01 NOTE — Telephone Encounter (Signed)
Pt is a 57 yr old male that saw Dr. Graciela Husbands on 05/04/17. Wt was 88.9Kg. SCr was 0.93 on that day as well. Will refill Eliquis 5mg  BID.

## 2017-06-04 ENCOUNTER — Other Ambulatory Visit: Payer: Self-pay | Admitting: Internal Medicine

## 2017-06-04 MED ORDER — LOSARTAN POTASSIUM 25 MG PO TABS: 25.0000 mg | ORAL_TABLET | Freq: Every day | ORAL | 11 refills | Status: DC

## 2017-06-04 NOTE — Telephone Encounter (Signed)
Pt's medication was sent to pt's pharmacy as requested. Confirmation received.  °

## 2017-06-15 ENCOUNTER — Other Ambulatory Visit: Payer: Self-pay

## 2017-06-15 ENCOUNTER — Encounter (HOSPITAL_COMMUNITY): Payer: Self-pay | Admitting: General Practice

## 2017-06-15 ENCOUNTER — Encounter (HOSPITAL_COMMUNITY): Payer: Self-pay | Admitting: Nurse Practitioner

## 2017-06-15 ENCOUNTER — Inpatient Hospital Stay (HOSPITAL_COMMUNITY)
Admission: AD | Admit: 2017-06-15 | Discharge: 2017-06-18 | DRG: 310 | Disposition: A | Discharge status: Home / Self Care | Payer: BLUE CROSS/BLUE SHIELD | Source: Ambulatory Visit | Attending: Internal Medicine | Admitting: Internal Medicine

## 2017-06-15 ENCOUNTER — Ambulatory Visit (HOSPITAL_COMMUNITY)
Admission: RE | Admit: 2017-06-15 | Discharge: 2017-06-15 | Disposition: A | Discharge status: Home / Self Care | Payer: BLUE CROSS/BLUE SHIELD | Source: Ambulatory Visit | Attending: Nurse Practitioner | Admitting: Nurse Practitioner

## 2017-06-15 VITALS — BP 132/64 | HR 76 | Ht 67.0 in | Wt 196.0 lb

## 2017-06-15 DIAGNOSIS — I272 Pulmonary hypertension, unspecified: Secondary | ICD-10-CM | POA: Diagnosis not present

## 2017-06-15 DIAGNOSIS — I10 Essential (primary) hypertension: Secondary | ICD-10-CM | POA: Diagnosis not present

## 2017-06-15 DIAGNOSIS — I4891 Unspecified atrial fibrillation: Secondary | ICD-10-CM | POA: Diagnosis not present

## 2017-06-15 DIAGNOSIS — I493 Ventricular premature depolarization: Secondary | ICD-10-CM | POA: Diagnosis present

## 2017-06-15 DIAGNOSIS — Z79899 Other long term (current) drug therapy: Secondary | ICD-10-CM

## 2017-06-15 DIAGNOSIS — I447 Left bundle-branch block, unspecified: Secondary | ICD-10-CM | POA: Diagnosis present

## 2017-06-15 DIAGNOSIS — I480 Paroxysmal atrial fibrillation: Secondary | ICD-10-CM | POA: Diagnosis not present

## 2017-06-15 DIAGNOSIS — R402413 Glasgow coma scale score 13-15, at hospital admission: Secondary | ICD-10-CM | POA: Diagnosis not present

## 2017-06-15 DIAGNOSIS — I440 Atrioventricular block, first degree: Secondary | ICD-10-CM | POA: Diagnosis not present

## 2017-06-15 DIAGNOSIS — Z8674 Personal history of sudden cardiac arrest: Secondary | ICD-10-CM

## 2017-06-15 DIAGNOSIS — I421 Obstructive hypertrophic cardiomyopathy: Secondary | ICD-10-CM | POA: Diagnosis not present

## 2017-06-15 DIAGNOSIS — I4581 Long QT syndrome: Secondary | ICD-10-CM | POA: Diagnosis present

## 2017-06-15 DIAGNOSIS — I472 Ventricular tachycardia: Secondary | ICD-10-CM | POA: Diagnosis not present

## 2017-06-15 DIAGNOSIS — Z95810 Presence of automatic (implantable) cardiac defibrillator: Secondary | ICD-10-CM

## 2017-06-15 DIAGNOSIS — Z8679 Personal history of other diseases of the circulatory system: Secondary | ICD-10-CM

## 2017-06-15 DIAGNOSIS — Z5181 Encounter for therapeutic drug level monitoring: Secondary | ICD-10-CM | POA: Diagnosis not present

## 2017-06-15 DIAGNOSIS — I481 Persistent atrial fibrillation: Principal | ICD-10-CM | POA: Diagnosis present

## 2017-06-15 DIAGNOSIS — Z7901 Long term (current) use of anticoagulants: Secondary | ICD-10-CM

## 2017-06-15 DIAGNOSIS — I422 Other hypertrophic cardiomyopathy: Secondary | ICD-10-CM | POA: Diagnosis not present

## 2017-06-15 HISTORY — DX: Headache, unspecified: R51.9

## 2017-06-15 HISTORY — DX: Personal history of urinary calculi: Z87.442

## 2017-06-15 HISTORY — DX: Headache: R51

## 2017-06-15 HISTORY — DX: Presence of automatic (implantable) cardiac defibrillator: Z95.810

## 2017-06-15 LAB — BASIC METABOLIC PANEL
Anion gap: 11 (ref 5–15)
BUN: 12 mg/dL (ref 6–20)
CO2: 25 mmol/L (ref 22–32)
Calcium: 9.5 mg/dL (ref 8.9–10.3)
Chloride: 103 mmol/L (ref 101–111)
Creatinine, Ser: 1.09 mg/dL (ref 0.61–1.24)
GFR calc Af Amer: >60 mL/min (ref >60)
GFR calc non Af Amer: >60 mL/min (ref >60)
Glucose, Bld: 80 mg/dL (ref 65–99)
Potassium: 3.8 mmol/L (ref 3.5–5.1)
Sodium: 139 mmol/L (ref 135–145)

## 2017-06-15 LAB — MAGNESIUM: Magnesium: 2.1 mg/dL (ref 1.7–2.4)

## 2017-06-15 MED ORDER — POTASSIUM CHLORIDE CRYS ER 20 MEQ PO TBCR
40.0000 meq | EXTENDED_RELEASE_TABLET | Freq: Once | ORAL | Status: AC
  Administered 2017-06-15: 40 meq via ORAL
  Filled 2017-06-15: qty 2

## 2017-06-15 MED ORDER — ATENOLOL 25 MG PO TABS
75.0000 mg | ORAL_TABLET | Freq: Every day | ORAL | Status: DC
  Administered 2017-06-15 – 2017-06-17 (×3): 75 mg via ORAL
  Filled 2017-06-15 (×3): qty 3

## 2017-06-15 MED ORDER — DOFETILIDE 500 MCG PO CAPS
500.0000 ug | ORAL_CAPSULE | Freq: Two times a day (BID) | ORAL | Status: DC
  Administered 2017-06-15 – 2017-06-17 (×5): 500 ug via ORAL
  Filled 2017-06-15 (×5): qty 1

## 2017-06-15 MED ORDER — APIXABAN 5 MG PO TABS
5.0000 mg | ORAL_TABLET | Freq: Two times a day (BID) | ORAL | Status: DC
  Administered 2017-06-15 – 2017-06-18 (×6): 5 mg via ORAL
  Filled 2017-06-15 (×6): qty 1

## 2017-06-15 MED ORDER — SODIUM CHLORIDE 0.9% FLUSH: 3.0000 mL | INTRAVENOUS | Status: DC | PRN

## 2017-06-15 MED ORDER — LORATADINE 10 MG PO TABS: 10.0000 mg | ORAL_TABLET | Freq: Every day | ORAL | Status: DC | PRN

## 2017-06-15 MED ORDER — SODIUM CHLORIDE 0.9 % IV SOLN
250.0000 mL | INTRAVENOUS | Status: DC | PRN
Start: 2017-06-15 — End: 2017-06-18
  Administered 2017-06-17: 11:00:00 via INTRAVENOUS

## 2017-06-15 MED ORDER — SODIUM CHLORIDE 0.9% FLUSH
3.0000 mL | Freq: Two times a day (BID) | INTRAVENOUS | Status: DC
  Administered 2017-06-15 – 2017-06-17 (×3): 3 mL via INTRAVENOUS

## 2017-06-15 MED ORDER — LOSARTAN POTASSIUM 25 MG PO TABS
25.0000 mg | ORAL_TABLET | Freq: Every day | ORAL | Status: DC
  Administered 2017-06-16 – 2017-06-18 (×3): 25 mg via ORAL
  Filled 2017-06-15 (×3): qty 1

## 2017-06-15 MED ORDER — APIXABAN 5 MG PO TABS: 5.0000 mg | ORAL_TABLET | Freq: Two times a day (BID) | ORAL | Status: DC

## 2017-06-15 NOTE — Progress Notes (Signed)
Pharmacy Review for Dofetilide (Tikosyn) Initiation  Admit Complaint: 57 y.o. male admitted 06/15/2017 with atrial fibrillation to be initiated on dofetilide.   Assessment:  Patient Exclusion Criteria: If any screening criteria checked as "Yes", then  patient  should NOT receive dofetilide until criteria item is corrected. If "Yes" please indicate correction plan.  YES  NO Patient  Exclusion Criteria Correction Plan  [x]  []  Baseline QTc interval is greater than or equal to 440 msec. IF above YES box checked dofetilide contraindicated unless patient has ICD; then may proceed if QTc 500-550 msec or with known ventricular conduction abnormalities may proceed with QTc 550-600 msec. QTc =   Has ICD  []  [x]  Magnesium level is less than 1.8 mEq/l : Last magnesium:  Lab Results  Component Value Date   MG 2.1 06/15/2017         [x]  []  Potassium level is less than 4 mEq/l : Last potassium:  Lab Results  Component Value Date   K 3.8 06/15/2017       40 meq PO ordered  []  [x]  Patient is known or suspected to have a digoxin level greater than 2 ng/ml: No results found for: DIGOXIN    []  [x]  Creatinine clearance less than 20 ml/min (calculated using Cockcroft-Gault, actual body weight and serum creatinine): Estimated Creatinine Clearance: 80.7 mL/min (by C-G formula based on SCr of 1.09 mg/dL).    []  [x]  Patient has received drugs known to prolong the QT intervals within the last 48 hours (phenothiazines, tricyclics or tetracyclic antidepressants, erythromycin, H-1 antihistamines, cisapride, fluoroquinolones, azithromycin). Drugs not listed above may have an, as yet, undetected potential to prolong the QT interval, updated information on QT prolonging agents is available at this website:QT prolonging agents   []  [x]  Patient received a dose of hydrochlorothiazide (Oretic) alone or in any combination including triamterene (Dyazide, Maxzide) in the last 48 hours.   []  [x]  Patient received a  medication known to increase dofetilide plasma concentrations prior to initial dofetilide dose:  . Trimethoprim (Primsol, Proloprim) in the last 36 hours . Verapamil (Calan, Verelan) in the last 36 hours or a sustained release dose in the last 72 hours . Megestrol (Megace) in the last 5 days  . Cimetidine (Tagamet) in the last 6 hours . Ketoconazole (Nizoral) in the last 24 hours . Itraconazole (Sporanox) in the last 48 hours  . Prochlorperazine (Compazine) in the last 36 hours    []  [x]  Patient is known to have a history of torsades de pointes; congenital or acquired long QT syndromes.   []  [x]  Patient has received a Class 1 antiarrhythmic with less than 2 half-lives since last dose. (Disopyramide, Quinidine, Procainamide, Lidocaine, Mexiletine, Flecainide, Propafenone)   []  [x]  Patient has received amiodarone therapy in the past 3 months or amiodarone level is greater than 0.3 ng/ml.    Patient has been appropriately anticoagulated with apixaban.  Ordering provider was confirmed at [email or web address] if they are not listed on the [facility] Authorized Prescribers list.  Goal of Therapy: Follow renal function, electrolytes, potential drug interactions, and dose adjustment. Provide education and 1 week supply at discharge.  Plan:  [x]   Physician selected initial dose within range recommended for patients level of renal function - will monitor for response.  []   Physician selected initial dose outside of range recommended for patients level of renal function - will discuss if the dose should be altered at this time.   Select One Calculated CrCl  Dose q12h  [x]  > 60  ml/min 500 mcg  []  40-60 ml/min 250 mcg  []  20-40 ml/min 125 mcg   2. Follow up QTc after the first 5 doses, renal function, electrolytes (K & Mg) daily x 3     days, dose adjustment, success of initiation and facilitate 1 week discharge supply as     clinically indicated.  3. Initiate Tikosyn education video (Call [phone] and  ask for Tikosyn Video # 116).  4. Place Enrollment Form on the chart for discharge supply of dofetilide.   Loura Back, PharmD, BCPS Clinical Pharmacist Clinical phone for 06/15/2017 until 10p is [phone] After 10p, please call Main Rx at [phone] for assistance 06/15/2017 5:36 PM

## 2017-06-15 NOTE — H&P (Addendum)
Cardiology Admission History and Physical:   Patient ID: Donald Bond; MRN: [id number]; DOB: [birth year]   Admission date: 06/15/2017  Primary Care Provider: Medicine, [facility] Family Primary Electrophysiologist:  Dr. Graciela Husbands  Chief Complaint:  Here for Tikosyn initiation  Patient Profile:   Donald Bond is a 57 y.o. male with a history of HCM, VT/syncope, w/ICD, persistent AFib.  History of Present Illness:   [name] comes today to initiate Tikosyn.  He was last seen by Dr. Graciela Husbands in March, noted that he had inappropriate tx secondary to rapid AFib in rates in 240 bpm range and was decided to pursue Tikosyn therapy.  He was referred to the AFib clinic for arrangements.  He saw [name], NP today, reported no missed doses of his Elliquis, risks/benefots of drug aas well as costs were discussed and he wanted/was agreeable to proceed.   Past Medical History:  Diagnosis Date  . Hypertr obst cardiomyop   . ICD--[facility]    Dr. Graciela Husbands  . Orthostatic lightheadedness   . SYNCOPE   . TIREDNESS   . Ventricular tachycardia (HCC)    rx via ICD    Past Surgical History:  Procedure Laterality Date  . CARDIOVERSION  06/29/2016   Procedure: Cardioversion;  Surgeon: Duke Salvia, MD;  Location: [facility] INVASIVE CV LAB;  Service: Cardiovascular;;  . CHOLECYSTECTOMY     lap 2003  . Guidant Vitality ICD Impantation--Guidant Vitality T135--11/13/2003    . HYDROCELE EXCISION / REPAIR Left feb 2015  . ICD GENERATOR CHANGEOUT N/A 06/29/2016   Procedure: ICD Generator Changeout;  Surgeon: Duke Salvia, MD;  Location: [facility] INVASIVE CV LAB;  Service: Cardiovascular;  Laterality: N/A;     Medications Prior to Admission: Prior to Admission medications   Medication Sig Start Date End Date Taking? Authorizing Provider  apixaban (ELIQUIS) 5 MG TABS tablet Take 1 tablet (5 mg total) by mouth 2 (two) times daily. 06/01/17   Duke Salvia, MD  atenolol (TENORMIN) 25 MG tablet Take 3  tablets (75 mg total) by mouth daily. 02/01/17 06/15/17  Duke Salvia, MD  ibuprofen (ADVIL,MOTRIN) 200 MG tablet Take 600 mg by mouth every 4 (four) hours as needed for headache or moderate pain.     [provider]  loratadine (CLARITIN) 10 MG tablet Take 10 mg by mouth daily as needed for allergies.     [provider]  losartan (COZAAR) 25 MG tablet Take 1 tablet (25 mg total) by mouth daily. 06/04/17   Duke Salvia, MD     Allergies:   No Known Allergies  Social History:   Social History   Socioeconomic History  . Marital status: Married    Spouse name: Not on file  . Number of children: Not on file  . Years of education: Not on file  . Highest education level: Not on file  Occupational History  . Not on file  Social Needs  . Financial resource strain: Not on file  . Food insecurity:    Worry: Not on file    Inability: Not on file  . Transportation needs:    Medical: Not on file    Non-medical: Not on file  Tobacco Use  . Smoking status: Never Smoker  . Smokeless tobacco: Never Used  Substance and Sexual Activity  . Alcohol use: Yes    Comment: occasional   . Drug use: No  . Sexual activity: Not on file  Lifestyle  . Physical activity:  Days per week: Not on file    Minutes per session: Not on file  . Stress: Not on file  Relationships  . Social connections:    Talks on phone: Not on file    Gets together: Not on file    Attends religious service: Not on file    Active member of club or organization: Not on file    Attends meetings of clubs or organizations: Not on file    Relationship status: Not on file  . Intimate partner violence:    Fear of current or ex partner: Not on file    Emotionally abused: Not on file    Physically abused: Not on file    Forced sexual activity: Not on file  Other Topics Concern  . Not on file  Social History Narrative  . Not on file    Family History:   The patient's family history includes ALS in his  father; Heart attack in his brother; Hypertension in his unknown relative.    ROS:  Please see the history of present illness.  All other ROS reviewed and negative.     Physical Exam/Data:   Vitals:   06/15/17 1537  BP: (!) 141/91  Pulse: 78  Resp: 20  Temp: 98 F (36.7 C)  TempSrc: Oral  SpO2: 98%  Weight: 196 lb 12.8 oz (89.3 kg)  Height: 5\' 7"  (1.702 m)   No intake or output data in the 24 hours ending 06/15/17 1704 Filed Weights   06/15/17 1537  Weight: 196 lb 12.8 oz (89.3 kg)   Body mass index is 30.82 kg/m.  General:  Well nourished, well developed, in no acute distress HEENT: normal Lymph: no adenopathy Neck: no JVD Endocrine:  No thryomegaly Vascular: No carotid bruits Cardiac:  iRRR; 1/6 SM, no gallops  Or rubs Lungs:  CTA b/l, no wheezing, rhonchi or rales  Abd: soft, nontender Ext: no edema Musculoskeletal:  No deformities Skin: warm and dry  Neuro:  no focal abnormalities noted Psych:  Normal affect    EKG:  The ECG that was done was personally reviewed and demonstrates  AFib, LBBB, 76bpm, QTc is acceptable given LBBB and ICD in place  Relevant CV Studies:  06/15/16: TTE Study Conclusions - Left ventricle: The cavity size was normal. There was severe   focal basal and moderate concentric hypertrophy. Findings   consistent with HOCM but no evidence of anterior mitral valve or   chordal SAM and no LVOT outflow gradient noted . Systolic   function was normal. Images were inadequate for LV wall motion   assessment. Doppler parameters are consistent with high   ventricular filling pressure. - Aortic valve: Trileaflet; moderately thickened, moderately   calcified leaflets. There was mild regurgitation. - Mitral valve: There was moderate regurgitation. Effective   regurgitant orifice (PISA): 0.29 cm^2. Regurgitant volume (PISA):   37 ml. - Left atrium: The atrium was severely dilated. Anterior-posterior   dimension: 49 mm. Volume/bsa, ES, (1-plane  Simpson&'s, A2C): 60.2   ml/m^2. - Right ventricle: Pacer wire or catheter noted in right ventricle. - Pulmonary arteries: PA peak pressure: 46 mm Hg (S). Impressions: - Severe BSH with moderate LVF c/w HOCM but no evidence of dynamic   outflow obstruction and no SAM noted. Endocardial segments are   not adequately visualized to comment on wall motion and apex is   not well visualized. Recommend limited study with definity   contrast to assess wall motion and get accurate assessment of  LVF. There is severe LAE, moderate MR and moderate pulmonary HTN.   The right ventricular systolic pressure was increased consistent   with moderate pulmonary hypertension.  Laboratory Data:  Chemistry Recent Labs  Lab 06/15/17 0852  NA 139  K 3.8  CL 103  CO2 25  GLUCOSE 80  BUN 12  CREATININE 1.09  CALCIUM 9.5  GFRNONAA >60  GFRAA >60  ANIONGAP 11    No results for input(s): PROT, ALBUMIN, AST, ALT, ALKPHOS, BILITOT in the last 168 hours. HematologyNo results for input(s): WBC, RBC, HGB, HCT, MCV, MCH, MCHC, RDW, PLT in the last 168 hours. Cardiac EnzymesNo results for input(s): TROPONINI in the last 168 hours. No results for input(s): TROPIPOC in the last 168 hours.  BNPNo results for input(s): BNP, PROBNP in the last 168 hours.  DDimer No results for input(s): DDIMER in the last 168 hours.  Radiology/Studies:  No results found.  Assessment and Plan:   1. Persistent AFib     CHA2DS2Vasc is zero, on Eliquis     K+ 3.8, replacement ordered, no need to recheck prior to starting     Mag 2.1     Creat 1.09 (Calc CrCl is 95)     QTc is OK to start  2. HCM 3. Hx of VT, syncope     BSCi  ICD in place   For questions or updates, please contact [facility] Please consult [email or web address] for contact info under Cardiology/STEMI.    Signed, Sheilah Pigeon, PA-C  06/15/2017 5:04 PM   I have seen and examined this patient with Francis Dowse.  Agree with above, note added to reflect  my findings.  On exam, iRRR, no murmurs, lungs clear.  To the hospital for dofetilide load for persistent atrial fibrillation.  QTC is prolonged though he does have a left bundle branch block.  We Roshni Burbano plan for 500 mcg today with EKG after each dose and basic metabolic and magnesium daily.  Gloriana Piltz M. Mahogany Torrance MD 06/15/2017 5:25 PM

## 2017-06-15 NOTE — Progress Notes (Signed)
EKG 3hr post 1st dose of Tikosyn shows Aflutter QTC 503. Pt without complaints at this time. Will continue to monitor. Dierdre Highman, RN

## 2017-06-15 NOTE — Progress Notes (Signed)
Primary Care Physician: Medicine, [facility] Family Referring Physician: Dr. Nicholes Rough is a 57 y.o. male with a h/o HOCM, VT,ICD, afib that is in the afib clinic for admission for Tikosyn load. No benadryl for 3 weeks. No missed doses of eliquis. He is aware of the cost of the drug. EKG shows afib with LBBB at 76 bpm.  Today, he denies symptoms of palpitations, chest pain, shortness of breath, orthopnea, PND, lower extremity edema, dizziness, presyncope, syncope, or neurologic sequela. The patient is tolerating medications without difficulties and is otherwise without complaint today.   Past Medical History:  Diagnosis Date  . Hypertr obst cardiomyop   . ICD--[facility]    Dr. Graciela Husbands  . Orthostatic lightheadedness   . SYNCOPE   . TIREDNESS   . Ventricular tachycardia (HCC)    rx via ICD   Past Surgical History:  Procedure Laterality Date  . CARDIOVERSION  06/29/2016   Procedure: Cardioversion;  Surgeon: Duke Salvia, MD;  Location: [facility] INVASIVE CV LAB;  Service: Cardiovascular;;  . CHOLECYSTECTOMY     lap 2003  . Guidant Vitality ICD Impantation--Guidant Vitality T135--11/13/2003    . HYDROCELE EXCISION / REPAIR Left feb 2015  . ICD GENERATOR CHANGEOUT N/A 06/29/2016   Procedure: ICD Generator Changeout;  Surgeon: Duke Salvia, MD;  Location: [facility] INVASIVE CV LAB;  Service: Cardiovascular;  Laterality: N/A;    Current Outpatient Medications  Medication Sig Dispense Refill  . apixaban (ELIQUIS) 5 MG TABS tablet Take 1 tablet (5 mg total) by mouth 2 (two) times daily. 60 tablet 11  . atenolol (TENORMIN) 25 MG tablet Take 3 tablets (75 mg total) by mouth daily. 270 tablet 3  . ibuprofen (ADVIL,MOTRIN) 200 MG tablet Take 600 mg by mouth every 4 (four) hours as needed for headache or moderate pain.     Marland Kitchen loratadine (CLARITIN) 10 MG tablet Take 10 mg by mouth daily as needed for allergies.     Marland Kitchen losartan (COZAAR) 25 MG tablet Take 1 tablet (25 mg total) by  mouth daily. 30 tablet 11   No current facility-administered medications for this encounter.     No Known Allergies  Social History   Socioeconomic History  . Marital status: Married    Spouse name: Not on file  . Number of children: Not on file  . Years of education: Not on file  . Highest education level: Not on file  Occupational History  . Not on file  Social Needs  . Financial resource strain: Not on file  . Food insecurity:    Worry: Not on file    Inability: Not on file  . Transportation needs:    Medical: Not on file    Non-medical: Not on file  Tobacco Use  . Smoking status: Never Smoker  . Smokeless tobacco: Never Used  Substance and Sexual Activity  . Alcohol use: Yes    Comment: occasional   . Drug use: No  . Sexual activity: Not on file  Lifestyle  . Physical activity:    Days per week: Not on file    Minutes per session: Not on file  . Stress: Not on file  Relationships  . Social connections:    Talks on phone: Not on file    Gets together: Not on file    Attends religious service: Not on file    Active member of club or organization: Not on file    Attends meetings of clubs  or organizations: Not on file    Relationship status: Not on file  . Intimate partner violence:    Fear of current or ex partner: Not on file    Emotionally abused: Not on file    Physically abused: Not on file    Forced sexual activity: Not on file  Other Topics Concern  . Not on file  Social History Narrative  . Not on file    Family History  Problem Relation Age of Onset  . ALS Father   . Hypertension Unknown   . Heart attack Brother     ROS- All systems are reviewed and negative except as per the HPI above  Physical Exam: Vitals:   06/15/17 0854  BP: 132/64  Pulse: 76  Weight: 196 lb (88.9 kg)  Height: 5\' 7"  (1.702 m)   Wt Readings from Last 3 Encounters:  06/15/17 196 lb (88.9 kg)  05/04/17 196 lb (88.9 kg)  11/04/16 189 lb 6.4 oz (85.9 kg)     Labs: Lab Results  Component Value Date   NA 140 05/04/2017   K 4.8 05/04/2017   CL 103 05/04/2017   CO2 24 05/04/2017   GLUCOSE 87 05/04/2017   BUN 15 05/04/2017   CREATININE 0.93 05/04/2017   CALCIUM 9.5 05/04/2017   MG 2.2 06/04/2011   Lab Results  Component Value Date   INR 1.0 RATIO 03/20/2008   No results found for: CHOL, HDL, LDLCALC, TRIG   GEN- The patient is well appearing, alert and oriented x 3 today.   Head- normocephalic, atraumatic Eyes-  Sclera clear, conjunctiva pink Ears- hearing intact Oropharynx- clear Neck- supple, no JVP Lymph- no cervical lymphadenopathy Lungs- Clear to ausculation bilaterally, normal work of breathing Heart- Regular rate and rhythm, no murmurs, rubs or gallops, PMI not laterally displaced GI- soft, NT, ND, + BS Extremities- no clubbing, cyanosis, or edema MS- no significant deformity or atrophy Skin- no rash or lesion Psych- euthymic mood, full affect Neuro- strength and sensation are intact  EKG-afib with LBBB at 76 bpm,, qrs int 152 ms, qtc 481 ms,  Epic reviewed   Assessment and Plan: 1. Persistent afib Here today for admission for tikosyn per Dr. Graciela Husbands No bendaryl for 2 weeks No missed eliquis for at least 3 weeks Chadsvasc score of 1 Drugs reviewed by PharmD nad is not any qtc prolonging drugs Qtc at 481 but  LBBB is making it appear longer Bmet/mag pending  2. HOCM/VT ICD in place Per Dr. Graciela Husbands Continue atenolol   Elvina Sidle. Matthew Folks [facility] [address] [place], [place] [address] [phone]

## 2017-06-16 ENCOUNTER — Other Ambulatory Visit: Payer: Self-pay

## 2017-06-16 ENCOUNTER — Ambulatory Visit (HOSPITAL_COMMUNITY): Payer: BLUE CROSS/BLUE SHIELD | Admitting: Nurse Practitioner

## 2017-06-16 DIAGNOSIS — I481 Persistent atrial fibrillation: Principal | ICD-10-CM

## 2017-06-16 DIAGNOSIS — I422 Other hypertrophic cardiomyopathy: Secondary | ICD-10-CM

## 2017-06-16 LAB — BASIC METABOLIC PANEL
Anion gap: 7 (ref 5–15)
BUN: 17 mg/dL (ref 6–20)
CO2: 26 mmol/L (ref 22–32)
Calcium: 9 mg/dL (ref 8.9–10.3)
Chloride: 105 mmol/L (ref 101–111)
Creatinine, Ser: 1.08 mg/dL (ref 0.61–1.24)
GFR calc Af Amer: >60 mL/min (ref >60)
GFR calc non Af Amer: >60 mL/min (ref >60)
Glucose, Bld: 92 mg/dL (ref 65–99)
Potassium: 4.4 mmol/L (ref 3.5–5.1)
Sodium: 138 mmol/L (ref 135–145)

## 2017-06-16 LAB — MAGNESIUM: Magnesium: 2.1 mg/dL (ref 1.7–2.4)

## 2017-06-16 MED ORDER — SODIUM CHLORIDE 0.9% FLUSH
3.0000 mL | Freq: Two times a day (BID) | INTRAVENOUS | Status: DC
  Administered 2017-06-16 – 2017-06-17 (×3): 3 mL via INTRAVENOUS

## 2017-06-16 MED ORDER — SODIUM CHLORIDE 0.9 % IV SOLN: 250.0000 mL | INTRAVENOUS | Status: DC

## 2017-06-16 MED ORDER — HYDROCORTISONE 1 % EX CREA
1.0000 "application " | TOPICAL_CREAM | Freq: Three times a day (TID) | CUTANEOUS | Status: DC | PRN
  Filled 2017-06-16: qty 28

## 2017-06-16 MED ORDER — SODIUM CHLORIDE 0.9% FLUSH: 3.0000 mL | INTRAVENOUS | Status: DC | PRN

## 2017-06-16 MED ORDER — ACETAMINOPHEN 325 MG PO TABS
650.0000 mg | ORAL_TABLET | Freq: Four times a day (QID) | ORAL | Status: DC | PRN
  Administered 2017-06-16: 650 mg via ORAL
  Filled 2017-06-16: qty 2

## 2017-06-16 NOTE — Progress Notes (Addendum)
Progress Note  Patient Name: Donald Bond Date of Encounter: 06/16/2017  Primary Cardiologist: No primary care provider on file.   Subjective   Doing well, no complaints, no CP or SOB  Inpatient Medications    Scheduled Meds: . apixaban  5 mg Oral BID  . atenolol  75 mg Oral Daily  . dofetilide  500 mcg Oral BID  . losartan  25 mg Oral Daily  . sodium chloride flush  3 mL Intravenous Q12H   Continuous Infusions: . sodium chloride     PRN Meds: sodium chloride, loratadine, sodium chloride flush   Vital Signs    Vitals:   06/15/17 1537 06/15/17 1954 06/16/17 0404  BP: (!) 141/91 134/82 117/77  Pulse: 78 73 66  Resp: 20 (!) 26 14  Temp: 98 F (36.7 C) 97.7 F (36.5 C) 98.2 F (36.8 C)  TempSrc: Oral Oral Oral  SpO2: 98% 98% 96%  Weight: 196 lb 12.8 oz (89.3 kg)  192 lb 3.2 oz (87.2 kg)  Height: 5\' 7"  (1.702 m)      Intake/Output Summary (Last 24 hours) at 06/16/2017 0912 Last data filed at 06/15/2017 1821 Gross per 24 hour  Intake 120 ml  Output -  Net 120 ml   Filed Weights   06/15/17 1537 06/16/17 0404  Weight: 196 lb 12.8 oz (89.3 kg) 192 lb 3.2 oz (87.2 kg)    Telemetry    AFib, 70's, 2 (monomorphic) NSVT 9 and 11 beats,  - Personally Reviewed  ECG    AFib, LBBB - Personally Reviewed  Physical Exam   GEN: No acute distress.   Neck: No JVD Cardiac: iRRR, no murmurs, rubs, or gallops.  Respiratory: CTA b/l. GI: Soft, nontender, non-distended  MS: No edema; No deformity. Neuro:  Nonfocal  Psych: Normal affect   Labs    Chemistry Recent Labs  Lab 06/15/17 0852 06/16/17 0330  NA 139 138  K 3.8 4.4  CL 103 105  CO2 25 26  GLUCOSE 80 92  BUN 12 17  CREATININE 1.09 1.08  CALCIUM 9.5 9.0  GFRNONAA >60 >60  GFRAA >60 >60  ANIONGAP 11 7     HematologyNo results for input(s): WBC, RBC, HGB, HCT, MCV, MCH, MCHC, RDW, PLT in the last 168 hours.  Cardiac EnzymesNo results for input(s): TROPONINI in the last 168 hours. No results for  input(s): TROPIPOC in the last 168 hours.   BNPNo results for input(s): BNP, PROBNP in the last 168 hours.   DDimer No results for input(s): DDIMER in the last 168 hours.   Radiology    No results found.  Cardiac Studies   06/15/16: TTE Study Conclusions - Left ventricle: The cavity size was normal. There was severe focal basal and moderate concentric hypertrophy. Findings consistent with HOCM but no evidence of anterior mitral valve or chordal SAM and no LVOT outflow gradient noted . Systolic function was normal. Images were inadequate for LV wall motion assessment. Doppler parameters are consistent with high ventricular filling pressure. - Aortic valve: Trileaflet; moderately thickened, moderately calcified leaflets. There was mild regurgitation. - Mitral valve: There was moderate regurgitation. Effective regurgitant orifice (PISA): 0.29 cm^2. Regurgitant volume (PISA): 37 ml. - Left atrium: The atrium was severely dilated. Anterior-posterior dimension: 49 mm. Volume/bsa, ES, (1-plane Simpson&'s, A2C): 60.2 ml/m^2. - Right ventricle: Pacer wire or catheter noted in right ventricle. - Pulmonary arteries: PA peak pressure: 46 mm Hg (S). Impressions: - Severe BSH with moderate LVF c/w HOCM but no  evidence of dynamic outflow obstruction and no SAM noted. Endocardial segments are not adequately visualized to comment on wall motion and apex is not well visualized. Recommend limited study with definity contrast to assess wall motion and get accurate assessment of LVF. There is severe LAE, moderate MR and moderate pulmonary HTN. The right ventricular systolic pressure was increased consistent with moderate pulmonary hypertension.    Patient Profile     57 y.o. male history of HCM, VT/syncope, w/ICD, persistent AFib, admitted for Tikosyn initiation.  Assessment & Plan    1. Persistent AFib, tikosyn initiation     CHA2DS2Vasc is zero, on  Eliquis     K+ 4.4     Mag 2.1     Creat 1.08 stable     EKG reviewed with Dr. Graciela Husbands, discussed NSVT on telemetry, OK to continue with same dose this AM  2. HCM 3. Hx of VT, syncope     BSCi  ICD in place  4. NSVT     Last ICD check (05/04/17) noted 201 NSVT episodes     Will re-interrogate today    For questions or updates, please contact [facility] Please consult [email or web address] for contact info under Cardiology/STEMI.      Signed, Sheilah Pigeon, PA-C  06/16/2017, 9:12 AM    VT NS events noted on tele but also on device  They are monomorphic, not common for dofetilide proarrhythmia so will continue with current med and anticpate DCCV in am

## 2017-06-16 NOTE — Care Management Note (Addendum)
Case Management Note  Patient Details  Name: Donald Bond MRN: [id number] Date of Birth: 07/14/1960  Subjective/Objective:  Pt presented for Tikosyn Load: Benefits Check Completed and CM will make pt aware of cost. CM will assist with Rx for 7 day supply no refills via Main Pharmacy and pt will need additional original Rx with refills.                                   Action/Plan:  S/W MARIA @ [employer] # [phone]    1. TIKOSYN 500 MCG BID  COVER- YES  CO-PAY- $ 60.00  PRIOR APPROVAL- NO   2. DOFETILIDE 500 MCG BID  COVER- YES  CO-PAY- $ 20.00  PRIOR APPROVAL- NO    PREFERRED PHARMACY : YES - [facility] AND [facility]  Expected Discharge Date:                  Expected Discharge Plan:  Home/Self Care  In-House Referral:  NA  Discharge planning Services  CM Consult, Medication Assistance  Post Acute Care Choice:  NA Choice offered to:  NA  DME Arranged:  N/A DME Agency:  NA  HH Arranged:  NA HH Agency:  NA  Status of Service:  Completed, signed off  If discussed at Long Length of Stay Meetings, dates discussed:    Additional Comments: 1544 06-17-17 [name], RN,BSN [phone] CM did speak with patient in regards to Tikosyn-pt went Online to get Tikosyn CO pay card for $4.00- He will need Rx for brand. CM did call [facility] [place] and the pharmacy will be able to order brand Tikosyn. No further needs at this time.  [name], RN 06/16/2017, 2:41 PM

## 2017-06-16 NOTE — Progress Notes (Signed)
CCMD reported pt had a 10 beat run of VTACH. Pt had just gotten up for morning vitals and weight. Pt denies CP, dizziness, or palpitations. AM labs to be drawn. Pt asymptomatic at this time. Will continue to monitor closely.  Viviano Simas, RN  Blood pressure 117/77,  pulse 66,  temperature 98.2 F (36.8 C),  temperature source Oral,  resp. rate 14,  SpO2 96 %.

## 2017-06-17 ENCOUNTER — Inpatient Hospital Stay (HOSPITAL_COMMUNITY): Payer: BLUE CROSS/BLUE SHIELD | Admitting: Anesthesiology

## 2017-06-17 ENCOUNTER — Other Ambulatory Visit: Payer: Self-pay

## 2017-06-17 ENCOUNTER — Encounter (HOSPITAL_COMMUNITY)
Admission: AD | Disposition: A | Discharge status: Home / Self Care | Payer: Self-pay | Source: Ambulatory Visit | Attending: Internal Medicine

## 2017-06-17 ENCOUNTER — Encounter (HOSPITAL_COMMUNITY): Payer: Self-pay | Admitting: *Deleted

## 2017-06-17 ENCOUNTER — Ambulatory Visit (HOSPITAL_COMMUNITY): Admission: RE | Admit: 2017-06-17 | Payer: BLUE CROSS/BLUE SHIELD | Source: Ambulatory Visit | Admitting: Cardiology

## 2017-06-17 DIAGNOSIS — I480 Paroxysmal atrial fibrillation: Secondary | ICD-10-CM

## 2017-06-17 HISTORY — PX: CARDIOVERSION: SHX1299

## 2017-06-17 LAB — BASIC METABOLIC PANEL
Anion gap: 8 (ref 5–15)
BUN: 13 mg/dL (ref 6–20)
CO2: 26 mmol/L (ref 22–32)
Calcium: 8.9 mg/dL (ref 8.9–10.3)
Chloride: 105 mmol/L (ref 101–111)
Creatinine, Ser: 1.02 mg/dL (ref 0.61–1.24)
GFR calc Af Amer: >60 mL/min (ref >60)
GFR calc non Af Amer: >60 mL/min (ref >60)
Glucose, Bld: 93 mg/dL (ref 65–99)
Potassium: 4.1 mmol/L (ref 3.5–5.1)
Sodium: 139 mmol/L (ref 135–145)

## 2017-06-17 LAB — MAGNESIUM: Magnesium: 2.1 mg/dL (ref 1.7–2.4)

## 2017-06-17 SURGERY — CARDIOVERSION
Anesthesia: General

## 2017-06-17 MED ORDER — LIDOCAINE HCL (CARDIAC) PF 100 MG/5ML IV SOSY
PREFILLED_SYRINGE | INTRAVENOUS | Status: DC | PRN
  Administered 2017-06-17: 100 mg via INTRATRACHEAL

## 2017-06-17 MED ORDER — PROPOFOL 10 MG/ML IV BOLUS
INTRAVENOUS | Status: DC | PRN
  Administered 2017-06-17: 10 mg via INTRAVENOUS
  Administered 2017-06-17: 40 mg via INTRAVENOUS

## 2017-06-17 MED ORDER — PHENYLEPHRINE HCL 10 MG/ML IJ SOLN
INTRAMUSCULAR | Status: DC | PRN
  Administered 2017-06-17: 80 ug via INTRAVENOUS

## 2017-06-17 NOTE — Transfer of Care (Signed)
Immediate Anesthesia Transfer of Care Note  Patient: Donald Bond  Procedure(s) Performed: CARDIOVERSION (N/A )  Patient Location: Endoscopy Unit  Anesthesia Type:General  Level of Consciousness: awake, alert  and oriented  Airway & Oxygen Therapy: Patient Spontanous Breathing  Post-op Assessment: Report given to RN and Post -op Vital signs reviewed and stable  Post vital signs: Reviewed and stable  Last Vitals:  Vitals Value Taken Time  BP    Temp    Pulse    Resp    SpO2      Last Pain:  Vitals:   06/17/17 1052  TempSrc: Oral  PainSc: 0-No pain      Patients Stated Pain Goal: 0 (06/15/17 1700)  Complications: No apparent anesthesia complications

## 2017-06-17 NOTE — Progress Notes (Addendum)
Progress Note  Patient Name: Donald Bond Date of Encounter: 06/17/2017  Primary Cardiologist: No primary care provider on file.   Subjective   Doing well, no complaints, no CP or SOB  Inpatient Medications    Scheduled Meds: . apixaban  5 mg Oral BID  . atenolol  75 mg Oral Daily  . dofetilide  500 mcg Oral BID  . losartan  25 mg Oral Daily  . sodium chloride flush  3 mL Intravenous Q12H  . sodium chloride flush  3 mL Intravenous Q12H   Continuous Infusions: . sodium chloride    . sodium chloride     PRN Meds: sodium chloride, acetaminophen, hydrocortisone cream, loratadine, sodium chloride flush, sodium chloride flush   Vital Signs    Vitals:   06/16/17 2016 06/16/17 2020 06/17/17 0509 06/17/17 0800  BP: 116/84  107/76 (!) 125/97  Pulse: 73 69 (!) 59   Resp: 16  16 12   Temp: 98 F (36.7 C) 98 F (36.7 C) 98.5 F (36.9 C)   TempSrc: Axillary Oral Oral   SpO2:  95% 97%   Weight:   192 lb 4.8 oz (87.2 kg)   Height:        Intake/Output Summary (Last 24 hours) at 06/17/2017 0947 Last data filed at 06/17/2017 0900 Gross per 24 hour  Intake 630 ml  Output -  Net 630 ml   Filed Weights   06/15/17 1537 06/16/17 0404 06/17/17 0509  Weight: 196 lb 12.8 oz (89.3 kg) 192 lb 3.2 oz (87.2 kg) 192 lb 4.8 oz (87.2 kg)    Telemetry    AFib, 60's, occ PVCs,  - Personally Reviewed  ECG    AFib, LBBB - reviewed with Dr. Graciela Husbands  Physical Exam   Exam is unchanged GEN: No acute distress.   Neck: No JVD Cardiac: iRRR, no murmurs, rubs, or gallops.  Respiratory: CTA b/l. GI: Soft, nontender, non-distended  MS: No edema; No deformity. Neuro:  Nonfocal  Psych: Normal affect   Labs    Chemistry Recent Labs  Lab 06/15/17 0852 06/16/17 0330 06/17/17 0323  NA 139 138 139  K 3.8 4.4 4.1  CL 103 105 105  CO2 25 26 26   GLUCOSE 80 92 93  BUN 12 17 13   CREATININE 1.09 1.08 1.02  CALCIUM 9.5 9.0 8.9  GFRNONAA >60 >60 >60  GFRAA >60 >60 >60  ANIONGAP 11 7 8       HematologyNo results for input(s): WBC, RBC, HGB, HCT, MCV, MCH, MCHC, RDW, PLT in the last 168 hours.  Cardiac EnzymesNo results for input(s): TROPONINI in the last 168 hours. No results for input(s): TROPIPOC in the last 168 hours.   BNPNo results for input(s): BNP, PROBNP in the last 168 hours.   DDimer No results for input(s): DDIMER in the last 168 hours.   Radiology    No results found.  Cardiac Studies   06/15/16: TTE Study Conclusions - Left ventricle: The cavity size was normal. There was severe focal basal and moderate concentric hypertrophy. Findings consistent with HOCM but no evidence of anterior mitral valve or chordal SAM and no LVOT outflow gradient noted . Systolic function was normal. Images were inadequate for LV wall motion assessment. Doppler parameters are consistent with high ventricular filling pressure. - Aortic valve: Trileaflet; moderately thickened, moderately calcified leaflets. There was mild regurgitation. - Mitral valve: There was moderate regurgitation. Effective regurgitant orifice (PISA): 0.29 cm^2. Regurgitant volume (PISA): 37 ml. - Left atrium: The atrium was  severely dilated. Anterior-posterior dimension: 49 mm. Volume/bsa, ES, (1-plane Simpson&'s, A2C): 60.2 ml/m^2. - Right ventricle: Pacer wire or catheter noted in right ventricle. - Pulmonary arteries: PA peak pressure: 46 mm Hg (S). Impressions: - Severe BSH with moderate LVF c/w HOCM but no evidence of dynamic outflow obstruction and no SAM noted. Endocardial segments are not adequately visualized to comment on wall motion and apex is not well visualized. Recommend limited study with definity contrast to assess wall motion and get accurate assessment of LVF. There is severe LAE, moderate MR and moderate pulmonary HTN. The right ventricular systolic pressure was increased consistent with moderate pulmonary hypertension.    Patient Profile      57 y.o. male history of HCM, VT/syncope, w/ICD, persistent AFib, admitted for Tikosyn initiation.  Assessment & Plan    1. Persistent AFib, tikosyn initiation     CHA2DS2Vasc is zero, on Eliquis     K+ 4.1     Mag 2.1     Creat 1.02 stable     EKG reviewed with Dr. Graciela Husbands this morningcontinue same dose, follow post DCCV today  2. HCM 3. Hx of VT, syncope     BSCi  ICD in place  4. NSVT    Single lead device difficult to interpret NS episodes without EGMs    V rhythm yesterday at about 100bpm, not recurrent    For questions or updates, please contact [facility] Please consult [email or web address] for contact info under Cardiology/STEMI.      Signed, Sheilah Pigeon, PA-C  06/17/2017, 9:47 AM    I have seen and examined this patient with Francis Dowse.  Agree with above, note added to reflect my findings.  On exam, iRRR, no murmurs, lungs clear. QTc remains stable. Plan for cardioversion today. Plan for cardioversion today.    Will M. Camnitz MD 06/17/2017 10:15 AM

## 2017-06-17 NOTE — Discharge Instructions (Signed)

## 2017-06-17 NOTE — CV Procedure (Signed)
    Electrical Cardioversion Procedure Note Donald Bond [id number] [birth year]  Procedure: Electrical Cardioversion Indications:  Atrial Fibrillation  Time Out: Verified patient identification, verified procedure,medications/allergies/relevent history reviewed, required imaging and test results available.  Performed  Procedure Details  The patient was NPO after midnight. Anesthesia was administered at the beside  by Dr.Jackson with 50mg  of propofol.  Cardioversion was performed with synchronized biphasic defibrillation via AP pads with 120 joules.  1 attempt(s) were performed.  The patient converted to normal sinus rhythm. The patient tolerated the procedure well   IMPRESSION:  Successful cardioversion of atrial fibrillation. Device normal.     Donald Bond 06/17/2017, 11:59 AM

## 2017-06-17 NOTE — Progress Notes (Signed)
Pt's QTc 3 hr post Tikosyn dose; 541-A. Fib/flutter. Notified Dr. Vonzella Nipple with cardiology of these results. Dr. Vonzella Nipple reviewed EKG strip and has no changes to plan of care at this time. Will continue to monitor.  Viviano Simas, RN

## 2017-06-17 NOTE — Interval H&P Note (Signed)
History and Physical Interval Note:  06/17/2017 11:26 AM  Donald Bond  has presented today for surgery, with the diagnosis of a fib  The various methods of treatment have been discussed with the patient and family. After consideration of risks, benefits and other options for treatment, the patient has consented to  Procedure(s): CARDIOVERSION (N/A) as a surgical intervention .  The patient's history has been reviewed, patient examined, no change in status, stable for surgery.  I have reviewed the patient's chart and labs.  Questions were answered to the patient's satisfaction.     [employer]

## 2017-06-17 NOTE — Anesthesia Preprocedure Evaluation (Signed)
Anesthesia Evaluation  Patient identified by MRN, date of birth, ID band Patient awake    Reviewed: Allergy & Precautions, H&P , Patient's Chart, lab work & pertinent test results  Airway Mallampati: II  TM Distance: >3 FB Neck ROM: full    Dental no notable dental hx.    Pulmonary    Pulmonary exam normal breath sounds clear to auscultation       Cardiovascular Exercise Tolerance: Good  Rhythm:regular Rate:Normal     Neuro/Psych    GI/Hepatic   Endo/Other    Renal/GU      Musculoskeletal   Abdominal   Peds  Hematology   Anesthesia Other Findings   Reproductive/Obstetrics                             Anesthesia Physical Anesthesia Plan  ASA: II  Anesthesia Plan: General   Post-op Pain Management:    Induction: Intravenous  PONV Risk Score and Plan:   Airway Management Planned: Mask  Additional Equipment:   Intra-op Plan:   Post-operative Plan:   Informed Consent: I have reviewed the patients History and Physical, chart, labs and discussed the procedure including the risks, benefits and alternatives for the proposed anesthesia with the patient or authorized representative who has indicated his/her understanding and acceptance.     Plan Discussed with: CRNA and Surgeon  Anesthesia Plan Comments: ( )        Anesthesia Quick Evaluation

## 2017-06-17 NOTE — H&P (View-Only) (Signed)
Progress Note  Patient Name: Donald Bond Date of Encounter: 06/17/2017  Primary Cardiologist: No primary care provider on file.   Subjective   Doing well, no complaints, no CP or SOB  Inpatient Medications    Scheduled Meds: . apixaban  5 mg Oral BID  . atenolol  75 mg Oral Daily  . dofetilide  500 mcg Oral BID  . losartan  25 mg Oral Daily  . sodium chloride flush  3 mL Intravenous Q12H  . sodium chloride flush  3 mL Intravenous Q12H   Continuous Infusions: . sodium chloride    . sodium chloride     PRN Meds: sodium chloride, acetaminophen, hydrocortisone cream, loratadine, sodium chloride flush, sodium chloride flush   Vital Signs    Vitals:   06/16/17 2016 06/16/17 2020 06/17/17 0509 06/17/17 0800  BP: 116/84  107/76 (!) 125/97  Pulse: 73 69 (!) 59   Resp: 16  16 12   Temp: 98 F (36.7 C) 98 F (36.7 C) 98.5 F (36.9 C)   TempSrc: Axillary Oral Oral   SpO2:  95% 97%   Weight:   192 lb 4.8 oz (87.2 kg)   Height:        Intake/Output Summary (Last 24 hours) at 06/17/2017 0947 Last data filed at 06/17/2017 0900 Gross per 24 hour  Intake 630 ml  Output -  Net 630 ml   Filed Weights   06/15/17 1537 06/16/17 0404 06/17/17 0509  Weight: 196 lb 12.8 oz (89.3 kg) 192 lb 3.2 oz (87.2 kg) 192 lb 4.8 oz (87.2 kg)    Telemetry    AFib, 60's, occ PVCs,  - Personally Reviewed  ECG    AFib, LBBB - reviewed with Dr. Graciela Husbands  Physical Exam   Exam is unchanged GEN: No acute distress.   Neck: No JVD Cardiac: iRRR, no murmurs, rubs, or gallops.  Respiratory: CTA b/l. GI: Soft, nontender, non-distended  MS: No edema; No deformity. Neuro:  Nonfocal  Psych: Normal affect   Labs    Chemistry Recent Labs  Lab 06/15/17 0852 06/16/17 0330 06/17/17 0323  NA 139 138 139  K 3.8 4.4 4.1  CL 103 105 105  CO2 25 26 26   GLUCOSE 80 92 93  BUN 12 17 13   CREATININE 1.09 1.08 1.02  CALCIUM 9.5 9.0 8.9  GFRNONAA >60 >60 >60  GFRAA >60 >60 >60  ANIONGAP 11 7 8       HematologyNo results for input(s): WBC, RBC, HGB, HCT, MCV, MCH, MCHC, RDW, PLT in the last 168 hours.  Cardiac EnzymesNo results for input(s): TROPONINI in the last 168 hours. No results for input(s): TROPIPOC in the last 168 hours.   BNPNo results for input(s): BNP, PROBNP in the last 168 hours.   DDimer No results for input(s): DDIMER in the last 168 hours.   Radiology    No results found.  Cardiac Studies   06/15/16: TTE Study Conclusions - Left ventricle: The cavity size was normal. There was severe focal basal and moderate concentric hypertrophy. Findings consistent with HOCM but no evidence of anterior mitral valve or chordal SAM and no LVOT outflow gradient noted . Systolic function was normal. Images were inadequate for LV wall motion assessment. Doppler parameters are consistent with high ventricular filling pressure. - Aortic valve: Trileaflet; moderately thickened, moderately calcified leaflets. There was mild regurgitation. - Mitral valve: There was moderate regurgitation. Effective regurgitant orifice (PISA): 0.29 cm^2. Regurgitant volume (PISA): 37 ml. - Left atrium: The atrium was  severely dilated. Anterior-posterior dimension: 49 mm. Volume/bsa, ES, (1-plane Simpson&'s, A2C): 60.2 ml/m^2. - Right ventricle: Pacer wire or catheter noted in right ventricle. - Pulmonary arteries: PA peak pressure: 46 mm Hg (S). Impressions: - Severe BSH with moderate LVF c/w HOCM but no evidence of dynamic outflow obstruction and no SAM noted. Endocardial segments are not adequately visualized to comment on wall motion and apex is not well visualized. Recommend limited study with definity contrast to assess wall motion and get accurate assessment of LVF. There is severe LAE, moderate MR and moderate pulmonary HTN. The right ventricular systolic pressure was increased consistent with moderate pulmonary hypertension.    Patient Profile      57 y.o. male history of HCM, VT/syncope, w/ICD, persistent AFib, admitted for Tikosyn initiation.  Assessment & Plan    1. Persistent AFib, tikosyn initiation     CHA2DS2Vasc is zero, on Eliquis     K+ 4.1     Mag 2.1     Creat 1.02 stable     EKG reviewed with Dr. Graciela Husbands this morningcontinue same dose, follow post DCCV today  2. HCM 3. Hx of VT, syncope     BSCi  ICD in place  4. NSVT    Single lead device difficult to interpret NS episodes without EGMs    V rhythm yesterday at about 100bpm, not recurrent    For questions or updates, please contact [facility] Please consult [email or web address] for contact info under Cardiology/STEMI.      Signed, Sheilah Pigeon, PA-C  06/17/2017, 9:47 AM    I have seen and examined this patient with Francis Dowse.  Agree with above, note added to reflect my findings.  On exam, iRRR, no murmurs, lungs clear. QTc remains stable. Plan for cardioversion today. Plan for cardioversion today.    Mamta Rimmer M. Griffin Dewilde MD 06/17/2017 10:15 AM

## 2017-06-17 NOTE — Discharge Summary (Addendum)
ELECTROPHYSIOLOGY PROCEDURE DISCHARGE SUMMARY    Patient ID: Donald Bond,  MRN: [id number], DOB/AGE: [birth year] 57 y.o.  Admit date: 06/15/2017 Discharge date: 06/18/17  Primary Care Physician: Medicine, [facility] [address] [facility]  Electrophysiologist: Dr. Graciela Husbands  Primary Discharge Diagnosis:  1.  Persistent atrial fibrillation status post Tikosyn loading this admission      CHA2DS2Vasc is zero, on Eliquis  Secondary Discharge Diagnosis:  1. HCM 2. Hx of VT, cardiac arrest     BSci ICD 3. LBBB  No Known Allergies   Procedures This Admission:  1.  Tikosyn loading 2.  Direct current cardioversion on 06/17/17 by Dr Anne Fu which successfully restored SR.  There were no early apparent complications.   Brief HPI: Donald Bond is a 57 y.o. male with a past medical history as noted above.  He is followed out patient by Dr. Graciela Husbands for his HCM, ICD and AFib.  He has hx of inappropriate shocks s/s hi AF.  Risks, benefits, and alternatives to Tikosyn were reviewed with the patient who wished to proceed.    Hospital Course:  The patient was admitted and Tikosyn was initiated.  Renal function and electrolytes were followed during the hospitalization.  His QTc remained stable.  On 06/17/17 he underwent direct current cardioversion which restored sinus rhythm.  The patient was monitored until discharge on telemetry which demonstrated SR 1st degree AVblock.  On the day of discharge,he was examined by Dr Graciela Husbands who considered them stable for discharge to home.  Follow-up has been arranged with the AFib clinic in 1 week and with Dr Graciela Husbands in 4 weeks.   Physical Exam: Vitals:   06/17/17 1928 06/17/17 2031 06/18/17 0602 06/18/17 0843  BP: 125/82 135/90 105/68 110/86  Pulse: 82 87 68   Resp: 18  14   Temp: 98.6 F (37 C)  97.9 F (36.6 C)   TempSrc: Oral  Oral   SpO2: 95%  99%   Weight:   191 lb 3.2 oz (86.7 kg)   Height:        GEN- The patient is well appearing, alert and  oriented x 3 today.   HEENT: normocephalic, atraumatic; sclera clear, conjunctiva pink; hearing intact; oropharynx clear; neck supple, no JVP Lymph- no cervical lymphadenopathy Lungs- CTA b/l, normal work of breathing.  No wheezes, rales, rhonchi Heart- RRR, no murmurs, rubs or gallops, PMI not laterally displaced GI- soft, non-tender, non-distended Extremities- no clubbing, cyanosis, or edema MS- no significant deformity or atrophy Skin- warm and dry, no rash or lesion Psych- euthymic mood, full affect Neuro- strength and sensation are intact   Labs:   Lab Results  Component Value Date   WBC 7.8 05/04/2017   HGB 15.7 05/04/2017   HCT 45.0 05/04/2017   MCV 90 05/04/2017   PLT 184 05/04/2017    Recent Labs  Lab 06/18/17 0244  NA 138  K 4.2  CL 107  CO2 22  BUN 16  CREATININE 1.02  CALCIUM 8.9  GLUCOSE 97     Discharge Medications:  Allergies as of 06/18/2017   No Known Allergies     Medication List    TAKE these medications   apixaban 5 MG Tabs tablet Commonly known as:  ELIQUIS Take 1 tablet (5 mg total) by mouth 2 (two) times daily.   atenolol 25 MG tablet Commonly known as:  TENORMIN Take 3 tablets (75 mg total) by mouth daily.   dofetilide 500 MCG capsule Commonly known as:  Joice Lofts  Take 1 capsule (500 mcg total) by mouth 2 (two) times daily.   ibuprofen 200 MG tablet Commonly known as:  ADVIL,MOTRIN Take 600 mg by mouth every 4 (four) hours as needed for headache or moderate pain.   loratadine 10 MG tablet Commonly known as:  CLARITIN Take 10 mg by mouth daily as needed for allergies.   losartan 25 MG tablet Commonly known as:  COZAAR Take 1 tablet (25 mg total) by mouth daily.       Disposition:  Home  Discharge Instructions    Diet - low sodium heart healthy   Complete by:  As directed    Increase activity slowly   Complete by:  As directed      Follow-up Information    [facility] Follow up on 06/25/2017.    Specialty:  Cardiology Why:  9:00AM Contact information: [address] [id number] mc [address] [place] [address] [phone]       Duke Salvia, MD Follow up on 07/15/2017.   Specialty:  Cardiology Why:  3:15PM Contact information: [address] [place] [address] [phone]           Duration of Discharge Encounter: Greater than 30 minutes including physician time.  Norma Fredrickson, PA-C 06/18/2017 12:39 PM  See separate note

## 2017-06-17 NOTE — Anesthesia Postprocedure Evaluation (Signed)
Anesthesia Post Note  Patient: Donald Bond  Procedure(s) Performed: CARDIOVERSION (N/A )     Patient location during evaluation: PACU Anesthesia Type: General Level of consciousness: awake and alert Pain management: pain level controlled Vital Signs Assessment: post-procedure vital signs reviewed and stable Respiratory status: spontaneous breathing, nonlabored ventilation, respiratory function stable and patient connected to nasal cannula oxygen Cardiovascular status: stable and blood pressure returned to baseline Postop Assessment: no apparent nausea or vomiting Anesthetic complications: no    Last Vitals:  Vitals:   06/17/17 1205 06/17/17 1215  BP: (!) 137/95 (!) 127/94  Pulse: 69 66  Resp: 15 19  Temp:    SpO2: 98% 97%    Last Pain:  Vitals:   06/17/17 1225  TempSrc:   PainSc: 0-No pain                 Lizandro Spellman EDWARD

## 2017-06-18 ENCOUNTER — Encounter (HOSPITAL_COMMUNITY): Payer: Self-pay | Admitting: Cardiology

## 2017-06-18 ENCOUNTER — Other Ambulatory Visit: Payer: Self-pay

## 2017-06-18 DIAGNOSIS — I421 Obstructive hypertrophic cardiomyopathy: Secondary | ICD-10-CM

## 2017-06-18 LAB — BASIC METABOLIC PANEL
Anion gap: 9 (ref 5–15)
BUN: 16 mg/dL (ref 6–20)
CO2: 22 mmol/L (ref 22–32)
Calcium: 8.9 mg/dL (ref 8.9–10.3)
Chloride: 107 mmol/L (ref 101–111)
Creatinine, Ser: 1.02 mg/dL (ref 0.61–1.24)
GFR calc Af Amer: >60 mL/min (ref >60)
GFR calc non Af Amer: >60 mL/min (ref >60)
Glucose, Bld: 97 mg/dL (ref 65–99)
Potassium: 4.2 mmol/L (ref 3.5–5.1)
Sodium: 138 mmol/L (ref 135–145)

## 2017-06-18 LAB — MAGNESIUM: Magnesium: 2.1 mg/dL (ref 1.7–2.4)

## 2017-06-18 MED ORDER — DOFETILIDE 500 MCG PO CAPS
500.0000 ug | ORAL_CAPSULE | Freq: Two times a day (BID) | ORAL | Status: DC
  Administered 2017-06-18: 500 ug via ORAL
  Filled 2017-06-18: qty 1

## 2017-06-18 MED ORDER — DOFETILIDE 500 MCG PO CAPS: 500.0000 ug | ORAL_CAPSULE | Freq: Two times a day (BID) | ORAL | 4 refills | Status: DC

## 2017-06-18 NOTE — Progress Notes (Signed)
Progress Note  Patient Name: Donald Bond Date of Encounter: 06/18/2017  Primary Cardiologist: No primary care provider on file.   Subjective   Without complaints and no shortnes of breath  Tolerating dofetilide without difficulty   Inpatient Medications    Scheduled Meds: . apixaban  5 mg Oral BID  . atenolol  75 mg Oral Daily  . dofetilide  500 mcg Oral BID  . losartan  25 mg Oral Daily  . sodium chloride flush  3 mL Intravenous Q12H  . sodium chloride flush  3 mL Intravenous Q12H   Continuous Infusions: . sodium chloride    . sodium chloride     PRN Meds: sodium chloride, acetaminophen, hydrocortisone cream, loratadine, sodium chloride flush, sodium chloride flush   Vital Signs    Vitals:   06/17/17 1928 06/17/17 2031 06/18/17 0602 06/18/17 0843  BP: 125/82 135/90 105/68 110/86  Pulse: 82 87 68   Resp: 18  14   Temp: 98.6 F (37 C)  97.9 F (36.6 C)   TempSrc: Oral  Oral   SpO2: 95%  99%   Weight:   191 lb 3.2 oz (86.7 kg)   Height:        Intake/Output Summary (Last 24 hours) at 06/18/2017 0923 Last data filed at 06/17/2017 2030 Gross per 24 hour  Intake 440 ml  Output -  Net 440 ml   Filed Weights   06/16/17 0404 06/17/17 0509 06/18/17 0602  Weight: 192 lb 3.2 oz (87.2 kg) 192 lb 4.8 oz (87.2 kg) 191 lb 3.2 oz (86.7 kg)    Telemetry    Telemetry Personally reviewed  Sinus with freq PAC  ECG    Sinus with :LBBBB QTc corrected for LBBB about 490  Physical Exam   Well developed and nourished in no acute distress HENT normal Neck supple with JVP-flat Clear Regular rate and rhythm, decreases s1 Abd-soft with active BS No Clubbing cyanosis edema Skin-warm and dry A & Oriented  Grossly normal sensory and motor function   Labs    Chemistry Recent Labs  Lab 06/16/17 0330 06/17/17 0323 06/18/17 0244  NA 138 139 138  K 4.4 4.1 4.2  CL 105 105 107  CO2 26 26 22   GLUCOSE 92 93 97  BUN 17 13 16   CREATININE 1.08 1.02 1.02  CALCIUM  9.0 8.9 8.9  GFRNONAA >60 >60 >60  GFRAA >60 >60 >60  ANIONGAP 7 8 9      HematologyNo results for input(s): WBC, RBC, HGB, HCT, MCV, MCH, MCHC, RDW, PLT in the last 168 hours.  Cardiac EnzymesNo results for input(s): TROPONINI in the last 168 hours. No results for input(s): TROPIPOC in the last 168 hours.   BNPNo results for input(s): BNP, PROBNP in the last 168 hours.   DDimer No results for input(s): DDIMER in the last 168 hours.   Radiology    No results found.  Cardiac Studies   06/15/16: TTE Study Conclusions - Left ventricle: The cavity size was normal. There was severe focal basal and moderate concentric hypertrophy. Findings consistent with HOCM but no evidence of anterior mitral valve or chordal SAM and no LVOT outflow gradient noted . Systolic function was normal. Images were inadequate for LV wall motion assessment. Doppler parameters are consistent with high ventricular filling pressure. - Aortic valve: Trileaflet; moderately thickened, moderately calcified leaflets. There was mild regurgitation. - Mitral valve: There was moderate regurgitation. Effective regurgitant orifice (PISA): 0.29 cm^2. Regurgitant volume (PISA): 37 ml. - Left  atrium: The atrium was severely dilated. Anterior-posterior dimension: 49 mm. Volume/bsa, ES, (1-plane Simpson&'s, A2C): 60.2 ml/m^2. - Right ventricle: Pacer wire or catheter noted in right ventricle. - Pulmonary arteries: PA peak pressure: 46 mm Hg (S). Impressions: - Severe BSH with moderate LVF c/w HOCM but no evidence of dynamic outflow obstruction and no SAM noted. Endocardial segments are not adequately visualized to comment on wall motion and apex is not well visualized. Recommend limited study with definity contrast to assess wall motion and get accurate assessment of LVF. There is severe LAE, moderate MR and moderate pulmonary HTN. The right ventricular systolic pressure was increased  consistent with moderate pulmonary hypertension.    Patient Profile     57 y.o. male history of HCM, VT/syncope, w/ICD, persistent AFib, admitted for Tikosyn initiation.  Assessment & Plan    1. Persistent AFib, tikosyn initiation     CHA2DS2Vasc is zero, on Eliquis     K+ 4.1     Mag 2.1     Creat 1.02 stable     EKG reviewed with Dr. Graciela Husbands this morningcontinue same dose, follow post DCCV today  2. HCM 3. Hx of VT, syncope     BSCi  ICD in place  4. NSVT    Single lead device difficult to interpret NS episodes without EGMs    V rhythm yesterday at about 100bpm, not recurrent  5) 1 AVB  Holding sinus with freq PACs  Not sanguine about ability to hold sinus going forward   Still will try  Hopefully he will be able to discern a difference or not in sinus  If he holds sinus would anticipate echo in about 5-6 weeks to reassess LA size    He has significant conduction system disease, but should not put him in harms way

## 2017-06-18 NOTE — Progress Notes (Signed)
Discharge instructions reviewed with pt. Pt has no questions at this time. Tikosyn prescription given to pt. Will cont to monitor pt.

## 2017-06-18 NOTE — Progress Notes (Signed)
Call received from Merita Norton, PA-C stating pt needed a stat EKG before next dose of Tikosyn. EKG completed, QTc is 501. Call back received and verified that Tikosyn was safe to give. Will follow up in 3 hours with next EKG. Viviano Simas, RN

## 2017-06-18 NOTE — Progress Notes (Signed)
Pt's 3 hour post Tikosyn QTc is 647. Pt maintaining SR. Dr. Allena Katz with cardiology on-call notified. Received a call back stating that tikosyn will be discontinued for now until the rounding team evaluates in the morning and approves AM dose. Will continue to monitor closely. Viviano Simas, RN

## 2017-06-22 ENCOUNTER — Other Ambulatory Visit: Payer: Self-pay | Admitting: Internal Medicine

## 2017-06-22 NOTE — Telephone Encounter (Signed)
Received a refill request from [facility] for the pt's Eliquis 5mg  tablet; the refill was sent on 06/01/[facility] to [facility]. Called the pt to ensure which is the correct pharmacy & he stated his insurance instructed [facility] and he can no longer use [facility]. Pt aware to notify them that he can no longer use [facility].  Called [facility] to notify them that the RX was sent elsewhere and I will deny this one and she verbalized understanding. Pt stated he has already picked up his medication from [facility].

## 2017-06-25 ENCOUNTER — Encounter (HOSPITAL_COMMUNITY): Payer: Self-pay | Admitting: Nurse Practitioner

## 2017-06-25 ENCOUNTER — Ambulatory Visit (HOSPITAL_COMMUNITY)
Admit: 2017-06-25 | Discharge: 2017-06-25 | Disposition: A | Discharge status: Home / Self Care | Payer: BLUE CROSS/BLUE SHIELD | Attending: Nurse Practitioner | Admitting: Nurse Practitioner

## 2017-06-25 VITALS — BP 118/82 | HR 73 | Ht 67.0 in | Wt 192.0 lb

## 2017-06-25 DIAGNOSIS — Z87442 Personal history of urinary calculi: Secondary | ICD-10-CM | POA: Insufficient documentation

## 2017-06-25 DIAGNOSIS — I447 Left bundle-branch block, unspecified: Secondary | ICD-10-CM | POA: Diagnosis not present

## 2017-06-25 DIAGNOSIS — I4891 Unspecified atrial fibrillation: Secondary | ICD-10-CM

## 2017-06-25 DIAGNOSIS — I481 Persistent atrial fibrillation: Secondary | ICD-10-CM | POA: Insufficient documentation

## 2017-06-25 DIAGNOSIS — Z79899 Other long term (current) drug therapy: Secondary | ICD-10-CM | POA: Diagnosis not present

## 2017-06-25 DIAGNOSIS — Z95810 Presence of automatic (implantable) cardiac defibrillator: Secondary | ICD-10-CM | POA: Diagnosis not present

## 2017-06-25 DIAGNOSIS — I421 Obstructive hypertrophic cardiomyopathy: Secondary | ICD-10-CM | POA: Diagnosis not present

## 2017-06-25 LAB — CBC
HCT: 47.1 % (ref 39.0–52.0)
Hemoglobin: 16.6 g/dL (ref 13.0–17.0)
MCH: 31.5 pg (ref 26.0–34.0)
MCHC: 35.2 g/dL (ref 30.0–36.0)
MCV: 89.4 fL (ref 78.0–100.0)
Platelets: 178 10*3/uL (ref 150–400)
RBC: 5.27 MIL/uL (ref 4.22–5.81)
RDW: 12.9 % (ref 11.5–15.5)
WBC: 7 10*3/uL (ref 4.0–10.5)

## 2017-06-25 LAB — BASIC METABOLIC PANEL
Anion gap: 9 (ref 5–15)
BUN: 11 mg/dL (ref 6–20)
CO2: 20 mmol/L — ABNORMAL LOW (ref 22–32)
Calcium: 9.4 mg/dL (ref 8.9–10.3)
Chloride: 110 mmol/L (ref 101–111)
Creatinine, Ser: 0.89 mg/dL (ref 0.61–1.24)
GFR calc Af Amer: >60 mL/min (ref >60)
GFR calc non Af Amer: >60 mL/min (ref >60)
Glucose, Bld: 91 mg/dL (ref 65–99)
Potassium: 4.3 mmol/L (ref 3.5–5.1)
Sodium: 139 mmol/L (ref 135–145)

## 2017-06-25 LAB — MAGNESIUM: Magnesium: 2.1 mg/dL (ref 1.7–2.4)

## 2017-06-25 NOTE — Progress Notes (Signed)
Primary Care Physician: Medicine, [facility] Referring Physician: Dr. Nicholes Rough is a 57 y.o. male with a h/o HOCM, VT,ICD, afib that is in the afib clinic for admission for Tikosyn load. No benadryl for 3 weeks. No missed doses of eliquis. He is aware of the cost of the drug. EKG shows afib with LBBB at 76 bpm.  F/u successful loading Tikosyn 500 mcg bid, that required cardioversion to return pt to SR. QTc remainded stable. He has felt better, but thought he noted some palpitations this am and is in afib with LBBB at 73 bpm. He is taking his meds accordingly.  Today, he denies symptoms of  chest pain, shortness of breath, orthopnea, PND, lower extremity edema, dizziness, presyncope, syncope, or neurologic sequela. Noted a few palpitations. The patient is tolerating medications without difficulties and is otherwise without complaint today.   Past Medical History:  Diagnosis Date  . AICD (automatic cardioverter/defibrillator) present X 4  . Headache    "weekly" (06/15/2017)  . History of kidney stones   . Hypertr obst cardiomyop   . ICD--[employer]    Dr. Graciela Husbands  . Orthostatic lightheadedness   . SYNCOPE   . TIREDNESS   . Ventricular tachycardia (HCC)    rx via ICD   Past Surgical History:  Procedure Laterality Date  . CARDIAC CATHETERIZATION    . CARDIAC DEFIBRILLATOR PLACEMENT  ~ 2000; ~ 2010   "I've had 4" (06/15/2017)  . CARDIOVERSION  06/29/2016   Procedure: Cardioversion;  Surgeon: Duke Salvia, MD;  Location: [facility] INVASIVE CV LAB;  Service: Cardiovascular;;  . CARDIOVERSION N/A 06/17/2017   Procedure: CARDIOVERSION;  Surgeon: Jake Bathe, MD;  Location: [facility] ENDOSCOPY;  Service: Cardiovascular;  Laterality: N/A;  . Guidant Vitality ICD Impantation--Guidant Vitality T135--11/13/2003    . HYDROCELE EXCISION / REPAIR Left 04/2013  . ICD GENERATOR CHANGEOUT N/A 06/29/2016   Procedure: ICD Generator Changeout;  Surgeon: Duke Salvia, MD;   Location: [facility] INVASIVE CV LAB;  Service: Cardiovascular;  Laterality: N/A;  . LAPAROSCOPIC CHOLECYSTECTOMY  2003  . LITHOTRIPSY  X 1    Current Outpatient Medications  Medication Sig Dispense Refill  . apixaban (ELIQUIS) 5 MG TABS tablet Take 1 tablet (5 mg total) by mouth 2 (two) times daily. 60 tablet 11  . atenolol (TENORMIN) 25 MG tablet Take 3 tablets (75 mg total) by mouth daily. 270 tablet 3  . dofetilide (TIKOSYN) 500 MCG capsule Take 1 capsule (500 mcg total) by mouth 2 (two) times daily. 60 capsule 4  . ibuprofen (ADVIL,MOTRIN) 200 MG tablet Take 600 mg by mouth every 4 (four) hours as needed for headache or moderate pain.     Marland Kitchen loratadine (CLARITIN) 10 MG tablet Take 10 mg by mouth daily as needed for allergies.     Marland Kitchen losartan (COZAAR) 25 MG tablet Take 1 tablet (25 mg total) by mouth daily. 30 tablet 11   No current facility-administered medications for this encounter.     No Known Allergies  Social History   Socioeconomic History  . Marital status: Married    Spouse name: Not on file  . Number of children: Not on file  . Years of education: Not on file  . Highest education level: Not on file  Occupational History  . Not on file  Social Needs  . Financial resource strain: Not on file  . Food insecurity:    Worry: Not on file    Inability: Not  on file  . Transportation needs:    Medical: Not on file    Non-medical: Not on file  Tobacco Use  . Smoking status: Never Smoker  . Smokeless tobacco: Former Neurosurgeon    Types: Chew  Substance and Sexual Activity  . Alcohol use: Yes    Alcohol/week: 1.8 oz    Types: 3 Cans of beer per week  . Drug use: No  . Sexual activity: Yes  Lifestyle  . Physical activity:    Days per week: Not on file    Minutes per session: Not on file  . Stress: Not on file  Relationships  . Social connections:    Talks on phone: Not on file    Gets together: Not on file    Attends religious service: Not on file    Active member of club or  organization: Not on file    Attends meetings of clubs or organizations: Not on file    Relationship status: Not on file  . Intimate partner violence:    Fear of current or ex partner: Not on file    Emotionally abused: Not on file    Physically abused: Not on file    Forced sexual activity: Not on file  Other Topics Concern  . Not on file  Social History Narrative  . Not on file    Family History  Problem Relation Age of Onset  . ALS Father   . Hypertension Unknown   . Heart attack Brother     ROS- All systems are reviewed and negative except as per the HPI above  Physical Exam: Vitals:   06/25/17 0857  BP: 118/82  Pulse: 73  Weight: 192 lb (87.1 kg)  Height: 5\' 7"  (1.702 m)   Wt Readings from Last 3 Encounters:  06/25/17 192 lb (87.1 kg)  06/18/17 191 lb 3.2 oz (86.7 kg)  06/15/17 196 lb (88.9 kg)    Labs: Lab Results  Component Value Date   NA 139 06/25/2017   K 4.3 06/25/2017   CL 110 06/25/2017   CO2 20 (L) 06/25/2017   GLUCOSE 91 06/25/2017   BUN 11 06/25/2017   CREATININE 0.89 06/25/2017   CALCIUM 9.4 06/25/2017   MG 2.1 06/25/2017   Lab Results  Component Value Date   INR 1.0 RATIO 03/20/2008   No results found for: CHOL, HDL, LDLCALC, TRIG   GEN- The patient is well appearing, alert and oriented x 3 today.   Head- normocephalic, atraumatic Eyes-  Sclera clear, conjunctiva pink Ears- hearing intact Oropharynx- clear Neck- supple, no JVP Lymph- no cervical lymphadenopathy Lungs- Clear to ausculation bilaterally, normal work of breathing Heart- irregular rate and rhythm, no murmurs, rubs or gallops, PMI not laterally displaced GI- soft, NT, ND, + BS Extremities- no clubbing, cyanosis, or edema MS- no significant deformity or atrophy Skin- no rash or lesion Psych- euthymic mood, full affect Neuro- strength and sensation are intact  EKG-afib with LBBB at 73 bpm,, qrs int 140 ms, qtc 497 ms,  Epic reviewed   Assessment and Plan: 1.  Persistent afib S/p admission for tikosyn  Has felt better Today in afib May have just caught him out of rhythm for a few hours No missed eliquis   Chadsvasc score of 1 Reminded no benadryl  Bmet/mag/cbc pending  2. HOCM/VT ICD in place Per Dr. Graciela Husbands Continue atenolol   Will bring back Tuesday for f/u with interrogation, if in persistent afib will arrange for cardioversion  Lupita Leash C. Noralyn Pick,  [facility] [address] [place], [place] [address] [phone]

## 2017-06-29 ENCOUNTER — Ambulatory Visit (HOSPITAL_COMMUNITY)
Admission: RE | Admit: 2017-06-29 | Discharge: 2017-06-29 | Disposition: A | Discharge status: Home / Self Care | Payer: BLUE CROSS/BLUE SHIELD | Source: Ambulatory Visit | Attending: Nurse Practitioner | Admitting: Nurse Practitioner

## 2017-06-29 ENCOUNTER — Encounter (HOSPITAL_COMMUNITY): Payer: Self-pay | Admitting: Nurse Practitioner

## 2017-06-29 VITALS — BP 108/76 | HR 68 | Ht 67.0 in | Wt 195.0 lb

## 2017-06-29 DIAGNOSIS — Z8249 Family history of ischemic heart disease and other diseases of the circulatory system: Secondary | ICD-10-CM | POA: Insufficient documentation

## 2017-06-29 DIAGNOSIS — Z7901 Long term (current) use of anticoagulants: Secondary | ICD-10-CM | POA: Insufficient documentation

## 2017-06-29 DIAGNOSIS — Z9049 Acquired absence of other specified parts of digestive tract: Secondary | ICD-10-CM | POA: Diagnosis not present

## 2017-06-29 DIAGNOSIS — Z79899 Other long term (current) drug therapy: Secondary | ICD-10-CM | POA: Diagnosis not present

## 2017-06-29 DIAGNOSIS — Z95810 Presence of automatic (implantable) cardiac defibrillator: Secondary | ICD-10-CM | POA: Diagnosis not present

## 2017-06-29 DIAGNOSIS — I4891 Unspecified atrial fibrillation: Secondary | ICD-10-CM

## 2017-06-29 DIAGNOSIS — Z820 Family history of epilepsy and other diseases of the nervous system: Secondary | ICD-10-CM | POA: Diagnosis not present

## 2017-06-29 DIAGNOSIS — I472 Ventricular tachycardia: Secondary | ICD-10-CM | POA: Diagnosis not present

## 2017-06-29 DIAGNOSIS — I481 Persistent atrial fibrillation: Secondary | ICD-10-CM | POA: Diagnosis not present

## 2017-06-29 DIAGNOSIS — Z87442 Personal history of urinary calculi: Secondary | ICD-10-CM | POA: Diagnosis not present

## 2017-06-29 DIAGNOSIS — I421 Obstructive hypertrophic cardiomyopathy: Secondary | ICD-10-CM | POA: Insufficient documentation

## 2017-06-29 NOTE — Progress Notes (Signed)
Primary Care Physician: Medicine, [facility] Referring Physician: Dr. Nicholes Rough is a 57 y.o. male with a h/o HOCM, VT,ICD, afib that is in the afib clinic for admission for Tikosyn load. No benadryl for 3 weeks. No missed doses of eliquis. He is aware of the cost of the drug. EKG shows afib with LBBB at 76 bpm.  F/u successful loading Tikosyn 500 mcg bid, that required cardioversion to return pt to SR. QTc remainded stable. He has felt better, but thought he noted some palpitations this am and is in afib with LBBB at 73 bpm. He is taking his meds accordingly.  F/u in afib clinic, 4/30. EKG shows afib at 68 bpm. Joey with BS in to interrogation, but not very helpful as he only has one lead. He can not give a full report as to his afib history. He did have a 5 beat run of v tach. I spoke to Dr. Graciela Husbands and he is not concerned re nonsustained V tach. This preceded Tikosyn. He feels that we should try to cardiovert once more before determining Tikosyn has not been effective. Pt is in agreement.   Today, he denies symptoms of  chest pain, shortness of breath, orthopnea, PND, lower extremity edema, dizziness, presyncope, syncope, or neurologic sequela. Noted a few palpitations. The patient is tolerating medications without difficulties and is otherwise without complaint today.   Past Medical History:  Diagnosis Date  . AICD (automatic cardioverter/defibrillator) present X 4  . Headache    "weekly" (06/15/2017)  . History of kidney stones   . Hypertr obst cardiomyop   . ICD--[employer]    Dr. Graciela Husbands  . Orthostatic lightheadedness   . SYNCOPE   . TIREDNESS   . Ventricular tachycardia (HCC)    rx via ICD   Past Surgical History:  Procedure Laterality Date  . CARDIAC CATHETERIZATION    . CARDIAC DEFIBRILLATOR PLACEMENT  ~ 2000; ~ 2010   "I've had 4" (06/15/2017)  . CARDIOVERSION  06/29/2016   Procedure: Cardioversion;  Surgeon: Duke Salvia, MD;  Location: [facility]  INVASIVE CV LAB;  Service: Cardiovascular;;  . CARDIOVERSION N/A 06/17/2017   Procedure: CARDIOVERSION;  Surgeon: Jake Bathe, MD;  Location: [facility] ENDOSCOPY;  Service: Cardiovascular;  Laterality: N/A;  . Guidant Vitality ICD Impantation--Guidant Vitality T135--11/13/2003    . HYDROCELE EXCISION / REPAIR Left 04/2013  . ICD GENERATOR CHANGEOUT N/A 06/29/2016   Procedure: ICD Generator Changeout;  Surgeon: Duke Salvia, MD;  Location: [facility] INVASIVE CV LAB;  Service: Cardiovascular;  Laterality: N/A;  . LAPAROSCOPIC CHOLECYSTECTOMY  2003  . LITHOTRIPSY  X 1    Current Outpatient Medications  Medication Sig Dispense Refill  . apixaban (ELIQUIS) 5 MG TABS tablet Take 1 tablet (5 mg total) by mouth 2 (two) times daily. 60 tablet 11  . atenolol (TENORMIN) 25 MG tablet Take 3 tablets (75 mg total) by mouth daily. 270 tablet 3  . dofetilide (TIKOSYN) 500 MCG capsule Take 1 capsule (500 mcg total) by mouth 2 (two) times daily. 60 capsule 4  . ibuprofen (ADVIL,MOTRIN) 200 MG tablet Take 600 mg by mouth every 4 (four) hours as needed for headache or moderate pain.     Marland Kitchen loratadine (CLARITIN) 10 MG tablet Take 10 mg by mouth daily as needed for allergies.     Marland Kitchen losartan (COZAAR) 25 MG tablet Take 1 tablet (25 mg total) by mouth daily. 30 tablet 11   No current facility-administered medications  for this encounter.     No Known Allergies  Social History   Socioeconomic History  . Marital status: Married    Spouse name: Not on file  . Number of children: Not on file  . Years of education: Not on file  . Highest education level: Not on file  Occupational History  . Not on file  Social Needs  . Financial resource strain: Not on file  . Food insecurity:    Worry: Not on file    Inability: Not on file  . Transportation needs:    Medical: Not on file    Non-medical: Not on file  Tobacco Use  . Smoking status: Never Smoker  . Smokeless tobacco: Former Neurosurgeon    Types: Chew  Substance and  Sexual Activity  . Alcohol use: Yes    Alcohol/week: 1.8 oz    Types: 3 Cans of beer per week  . Drug use: No  . Sexual activity: Yes  Lifestyle  . Physical activity:    Days per week: Not on file    Minutes per session: Not on file  . Stress: Not on file  Relationships  . Social connections:    Talks on phone: Not on file    Gets together: Not on file    Attends religious service: Not on file    Active member of club or organization: Not on file    Attends meetings of clubs or organizations: Not on file    Relationship status: Not on file  . Intimate partner violence:    Fear of current or ex partner: Not on file    Emotionally abused: Not on file    Physically abused: Not on file    Forced sexual activity: Not on file  Other Topics Concern  . Not on file  Social History Narrative  . Not on file    Family History  Problem Relation Age of Onset  . ALS Father   . Hypertension Unknown   . Heart attack Brother     ROS- All systems are reviewed and negative except as per the HPI above  Physical Exam: Vitals:   06/29/17 0837  BP: 108/76  Pulse: 68  SpO2: 96%  Weight: 195 lb (88.5 kg)  Height: 5\' 7"  (1.702 m)   Wt Readings from Last 3 Encounters:  06/29/17 195 lb (88.5 kg)  06/25/17 192 lb (87.1 kg)  06/18/17 191 lb 3.2 oz (86.7 kg)    Labs: Lab Results  Component Value Date   NA 139 06/25/2017   K 4.3 06/25/2017   CL 110 06/25/2017   CO2 20 (L) 06/25/2017   GLUCOSE 91 06/25/2017   BUN 11 06/25/2017   CREATININE 0.89 06/25/2017   CALCIUM 9.4 06/25/2017   MG 2.1 06/25/2017   Lab Results  Component Value Date   INR 1.0 RATIO 03/20/2008   No results found for: CHOL, HDL, LDLCALC, TRIG   GEN- The patient is well appearing, alert and oriented x 3 today.   Head- normocephalic, atraumatic Eyes-  Sclera clear, conjunctiva pink Ears- hearing intact Oropharynx- clear Neck- supple, no JVP Lymph- no cervical lymphadenopathy Lungs- Clear to ausculation  bilaterally, normal work of breathing Heart- irregular rate and rhythm, no murmurs, rubs or gallops, PMI not laterally displaced GI- soft, NT, ND, + BS Extremities- no clubbing, cyanosis, or edema MS- no significant deformity or atrophy Skin- no rash or lesion Psych- euthymic mood, full affect Neuro- strength and sensation are intact  EKG-afib with LBBB  at 68 bpm, qrs int 144 ms, qtc 514 ms Interrogation  today confirms afib Epic reviewed   Assessment and Plan: 1. Persistent afib S/p admission for tikosyn  Has felt better But has been in afib for two  office visits since Tikosyn load Interrogation today could not give me a full back history since hospitalization but discussed with Dr. Graciela Husbands  and will schedule for one more cardioversion before saying Tikosyn was not effective No  missed eliquis   Chadsvasc score of 1 Reminded no benadryl  Bmet/mag/cbc WNL drawn 4/26  2. HOCM/VT ICD in place Per Dr. Graciela Husbands, recent 5 sec VT run precedes tikosyn, ICD in place Continue atenolol   Cardioversion scheduled for May 3rd, has f/u with Dr. Graciela Husbands 5/16  Lupita Leash C. Matthew Folks [facility] [address] [place], [place] [address] [phone]

## 2017-06-29 NOTE — Patient Instructions (Signed)
Cardioversion scheduled for Friday, May 3rd  - Arrive at the [facility] and go to admitting at [facility] not eat or drink anything after midnight the night prior to your procedure.  - Take all your medication with a sip of water prior to arrival.  - You will not be able to drive home after your procedure.

## 2017-07-02 ENCOUNTER — Ambulatory Visit (HOSPITAL_COMMUNITY): Payer: BLUE CROSS/BLUE SHIELD | Admitting: Certified Registered Nurse Anesthetist

## 2017-07-02 ENCOUNTER — Encounter (HOSPITAL_COMMUNITY): Payer: Self-pay | Admitting: Cardiovascular Disease

## 2017-07-02 ENCOUNTER — Encounter (HOSPITAL_COMMUNITY)
Admission: RE | Disposition: A | Discharge status: Home / Self Care | Payer: Self-pay | Source: Ambulatory Visit | Attending: Cardiovascular Disease

## 2017-07-02 ENCOUNTER — Ambulatory Visit (HOSPITAL_COMMUNITY)
Admission: RE | Admit: 2017-07-02 | Discharge: 2017-07-02 | Disposition: A | Discharge status: Home / Self Care | Payer: BLUE CROSS/BLUE SHIELD | Source: Ambulatory Visit | Attending: Cardiovascular Disease | Admitting: Cardiovascular Disease

## 2017-07-02 DIAGNOSIS — Z79899 Other long term (current) drug therapy: Secondary | ICD-10-CM | POA: Diagnosis not present

## 2017-07-02 DIAGNOSIS — I447 Left bundle-branch block, unspecified: Secondary | ICD-10-CM | POA: Diagnosis not present

## 2017-07-02 DIAGNOSIS — I4891 Unspecified atrial fibrillation: Secondary | ICD-10-CM | POA: Diagnosis not present

## 2017-07-02 DIAGNOSIS — R001 Bradycardia, unspecified: Secondary | ICD-10-CM | POA: Insufficient documentation

## 2017-07-02 DIAGNOSIS — I10 Essential (primary) hypertension: Secondary | ICD-10-CM | POA: Insufficient documentation

## 2017-07-02 SURGERY — CANCELLED PROCEDURE
Anesthesia: General

## 2017-07-02 NOTE — Progress Notes (Signed)
Patient admitted to Endo unit for cardioversion. Patient placed on monitor and appeared to be in NSR. EKG and MD confirmed NSR. Patient informed to continue medications and keep follow up with Dr. Graciela Husbands. Patient cancelled prior to entering procedure room.

## 2017-07-02 NOTE — H&P (Signed)
Donald Bond is a 57 y.o. male who has presented today for surgery, with the diagnosis of atrial fibrillation. The various methods of treatment have been discussed with the patient and family. After consideration of risks, benefits and other options for treatment, the patient has consented to Procedure(s): TRANSESOPHAGEAL ECHOCARDIOGRAM (TEE) (N/A) as a surgical intervention . The patient's history has been reviewed, patient examined, no change in status, stable for surgery. I have reviewed the patient's chart and labs. Questions were answered to the patient's satisfaction.   Pier Laux C. Duke Salvia, MD, [facility]  07/02/2017 9:14 AM

## 2017-07-02 NOTE — Anesthesia Preprocedure Evaluation (Deleted)
Anesthesia Evaluation  Patient identified by MRN, date of birth, ID band Patient awake    Reviewed: Allergy & Precautions, NPO status , Patient's Chart, lab work & pertinent test results  Airway Mallampati: II  TM Distance: >3 FB Neck ROM: Full    Dental no notable dental hx.    Pulmonary neg pulmonary ROS,    Pulmonary exam normal breath sounds clear to auscultation       Cardiovascular hypertension, Pt. on medications Normal cardiovascular exam+ Cardiac Defibrillator  Rhythm:Regular Rate:Normal     Neuro/Psych negative neurological ROS  negative psych ROS   GI/Hepatic negative GI ROS, Neg liver ROS,   Endo/Other  negative endocrine ROS  Renal/GU negative Renal ROS  negative genitourinary   Musculoskeletal negative musculoskeletal ROS (+)   Abdominal   Peds negative pediatric ROS (+)  Hematology negative hematology ROS (+)   Anesthesia Other Findings   Reproductive/Obstetrics negative OB ROS                             Anesthesia Physical Anesthesia Plan  ASA: III  Anesthesia Plan: General   Post-op Pain Management:    Induction: Intravenous  PONV Risk Score and Plan: 0  Airway Management Planned: Mask  Additional Equipment:   Intra-op Plan:   Post-operative Plan:   Informed Consent: I have reviewed the patients History and Physical, chart, labs and discussed the procedure including the risks, benefits and alternatives for the proposed anesthesia with the patient or authorized representative who has indicated his/her understanding and acceptance.   Dental advisory given  Plan Discussed with: CRNA and Surgeon  Anesthesia Plan Comments:         Anesthesia Quick Evaluation

## 2017-07-15 ENCOUNTER — Ambulatory Visit (INDEPENDENT_AMBULATORY_CARE_PROVIDER_SITE_OTHER): Payer: BLUE CROSS/BLUE SHIELD | Admitting: Internal Medicine

## 2017-07-15 ENCOUNTER — Encounter: Payer: Self-pay | Admitting: Internal Medicine

## 2017-07-15 VITALS — BP 122/68 | HR 58 | Ht 67.0 in | Wt 197.0 lb

## 2017-07-15 DIAGNOSIS — Z95810 Presence of automatic (implantable) cardiac defibrillator: Secondary | ICD-10-CM | POA: Diagnosis not present

## 2017-07-15 DIAGNOSIS — I472 Ventricular tachycardia, unspecified: Secondary | ICD-10-CM

## 2017-07-15 DIAGNOSIS — I4891 Unspecified atrial fibrillation: Secondary | ICD-10-CM | POA: Diagnosis not present

## 2017-07-15 NOTE — Progress Notes (Signed)
Patient Care Team: Medicine, [facility] as PCP - General (Family Medicine)   HPI  Donald Bond is a 57 y.o. male Seen in followup for HCM associated with syncope and previously treated ventricular tachycardia;  He has had 3 previous devices.  Underwent gen change 4/4:30. 2018 w AEGIS pouch support.   He is persistent afib; on apixoban . He underwent cardioversion at the time of his device change He has no awareness of palpitations or changes in exercise tolerance.  The patient denies chest pain, shortness of breath, nocturnal dyspnea, orthopnea or peripheral edema.     DATE TEST    4/18    Echo   EF normal % LAE (49/2.4/70)               He has had problems with Afib and RVR with reaching into (VF zone > 240 bpm)  Atenolol was increased   Unfortunately he has continued to have episodes detected by his device > 210/240  He was recently hospitalized for initiation of dofetilide.  He had recurrent Afib and then underwent repeat cardioversion 4/30  Date Cr  K Mg Hgb  9/18 0.95 4.3  15.5  4/19  0.89 4.3 2.1 16.6      Past Medical History:  Diagnosis Date  . AICD (automatic cardioverter/defibrillator) present X 4  . Headache    "weekly" (06/15/2017)  . History of kidney stones   . Hypertr obst cardiomyop   . ICD--[employer]    Dr. Graciela Husbands  . Orthostatic lightheadedness   . SYNCOPE   . TIREDNESS   . Ventricular tachycardia (HCC)    rx via ICD    Past Surgical History:  Procedure Laterality Date  . CARDIAC CATHETERIZATION    . CARDIAC DEFIBRILLATOR PLACEMENT  ~ 2000; ~ 2010   "I've had 4" (06/15/2017)  . CARDIOVERSION  06/29/2016   Procedure: Cardioversion;  Surgeon: Duke Salvia, MD;  Location: [facility] INVASIVE CV LAB;  Service: Cardiovascular;;  . CARDIOVERSION N/A 06/17/2017   Procedure: CARDIOVERSION;  Surgeon: Jake Bathe, MD;  Location: [facility] ENDOSCOPY;  Service: Cardiovascular;  Laterality: N/A;  . Guidant Vitality ICD Impantation--Guidant  Vitality T135--11/13/2003    . HYDROCELE EXCISION / REPAIR Left 04/2013  . ICD GENERATOR CHANGEOUT N/A 06/29/2016   Procedure: ICD Generator Changeout;  Surgeon: Duke Salvia, MD;  Location: [facility] INVASIVE CV LAB;  Service: Cardiovascular;  Laterality: N/A;  . LAPAROSCOPIC CHOLECYSTECTOMY  2003  . LITHOTRIPSY  X 1    Current Outpatient Medications  Medication Sig Dispense Refill  . apixaban (ELIQUIS) 5 MG TABS tablet Take 1 tablet (5 mg total) by mouth 2 (two) times daily. 60 tablet 11  . atenolol (TENORMIN) 25 MG tablet Take 3 tablets (75 mg total) by mouth daily. 270 tablet 3  . dofetilide (TIKOSYN) 500 MCG capsule Take 1 capsule (500 mcg total) by mouth 2 (two) times daily. 60 capsule 4  . ibuprofen (ADVIL,MOTRIN) 200 MG tablet Take 800 mg by mouth every 4 (four) hours as needed for headache or moderate pain.     Marland Kitchen loratadine (CLARITIN) 10 MG tablet Take 10 mg by mouth daily as needed for allergies.     Marland Kitchen losartan (COZAAR) 25 MG tablet Take 1 tablet (25 mg total) by mouth daily. 30 tablet 11   No current facility-administered medications for this visit.     No Known Allergies  Review of Systems negative except from HPI and PMH  Physical Exam  BP 122/68   Pulse (!) 58   Ht 5\' 7"  (1.702 m)   Wt 197 lb (89.4 kg)   SpO2 97%   BMI 30.85 kg/m  Well developed and nourished in no acute distress HENT normal Neck supple with JVP-flat Clear Regular rate and rhythm, no murmurs or gallops Abd-soft with active BS No Clubbing cyanosis edema Skin-warm and dry A & Oriented  Grossly normal sensory and motor function  ECG sinus 58 24/15/44 LBBB   Assessment and plan  Hypertrophic cardio myopathy    Ventricular tachycardia with appropriate therapy    Inappropriate therapy  Atrial Fibrillation-persistent long-term  PVCs  Sleep disordered breathing and daytime somnolence  Status post ICD implantation-[employer]  The patient's device was interrogated.  The information was reviewed.  No changes were made in the programming.      Holding sinus  Not sanguine about long term maintaining of sinus, but now is the time to consider ablation and will refer him to Dr Fawn Kirk to consider ablation\  We spent more than 50% of our >25 min visit in face to face counseling regarding the above

## 2017-07-15 NOTE — Patient Instructions (Signed)
Medication Instructions:  Your physician recommends that you continue on your current medications as directed. Please refer to the Current Medication list given to you today.   Labwork: None ordered   Testing/Procedures: None ordered   Follow-Up: Your physician recommends that you schedule a follow-up appointment in: 3 months with Amber Seiler, NP   Any Other Special Instructions Will Be Listed Below (If Applicable).     If you need a refill on your cardiac medications before your next appointment, please call your pharmacy.   

## 2017-07-22 NOTE — Addendum Note (Signed)
Addended by: Dareen Piano on: 07/22/2017 09:41 AM   Modules accepted: Orders

## 2017-08-04 ENCOUNTER — Ambulatory Visit (INDEPENDENT_AMBULATORY_CARE_PROVIDER_SITE_OTHER): Payer: BLUE CROSS/BLUE SHIELD | Admitting: *Deleted

## 2017-08-04 ENCOUNTER — Encounter: Payer: Self-pay | Admitting: Internal Medicine

## 2017-08-04 ENCOUNTER — Ambulatory Visit (INDEPENDENT_AMBULATORY_CARE_PROVIDER_SITE_OTHER): Payer: BLUE CROSS/BLUE SHIELD | Admitting: Internal Medicine

## 2017-08-04 VITALS — BP 124/70 | HR 54 | Ht 67.0 in | Wt 196.0 lb

## 2017-08-04 DIAGNOSIS — I4819 Other persistent atrial fibrillation: Secondary | ICD-10-CM

## 2017-08-04 DIAGNOSIS — G4730 Sleep apnea, unspecified: Secondary | ICD-10-CM

## 2017-08-04 DIAGNOSIS — Z95810 Presence of automatic (implantable) cardiac defibrillator: Secondary | ICD-10-CM | POA: Diagnosis not present

## 2017-08-04 DIAGNOSIS — I472 Ventricular tachycardia, unspecified: Secondary | ICD-10-CM

## 2017-08-04 DIAGNOSIS — I421 Obstructive hypertrophic cardiomyopathy: Secondary | ICD-10-CM

## 2017-08-04 DIAGNOSIS — I481 Persistent atrial fibrillation: Secondary | ICD-10-CM

## 2017-08-04 LAB — CUP PACEART INCLINIC DEVICE CHECK
Brady Statistic RV Percent Paced: 1 % — CL
Date Time Interrogation Session: 20190605040000
HighPow Impedance: 53 Ohm
Implantable Lead Implant Date: 20000815
Implantable Lead Location: 753860
Implantable Lead Model: 144
Implantable Lead Serial Number: 334759
Implantable Pulse Generator Implant Date: 20180430
Lead Channel Impedance Value: 872 Ohm
Lead Channel Pacing Threshold Amplitude: 0.7 V
Lead Channel Pacing Threshold Pulse Width: 0.4 ms
Lead Channel Sensing Intrinsic Amplitude: 25 mV
Lead Channel Setting Pacing Amplitude: 2.5 V
Lead Channel Setting Pacing Pulse Width: 0.4 ms
Lead Channel Setting Sensing Sensitivity: 0.5 mV
Pulse Gen Serial Number: 198978

## 2017-08-04 NOTE — Progress Notes (Signed)
Electrophysiology Office Note   Date:  08/04/2017   ID:  Donald Bond, DOB February 03, 1961, MRN [id number]  PCP:  Medicine, [facility]    Primary Electrophysiologist: Dr Graciela Husbands  CC: afib   History of Present Illness: Donald Bond is a 57 y.o. male who presents today for electrophysiology evaluation.   He presents today for afib management.  He was initially diagnosed with atrial fibrillation after presenting for routine office visit 3/18 and being found to have afib.  He was unaware at the time.  He has a h/o HOCM, prior VT and ICD implantation.  He also has persistent atrial fibrillation for which he has been placed on tikosyn. He has required cardioversion.  He previously has had afib with RVR which has been an issue with ICD programming per Dr Odessa Fleming note 07/15/17.  He continues to have afib documented on subsequent office visits.  He is on eliquis for stroke prevention.  During afib, he has fatigue and SOB.  He is unaware of triggers.  He thinks that his afib is much improved with tikosyn.   He drinks 3-4 beers per week.  He had an outpatient sleep study which was "inconclusive".  He snores. Today, he denies symptoms of palpitations, chest pain, shortness of breath, orthopnea, PND, lower extremity edema, claudication, dizziness, presyncope, syncope, bleeding, or neurologic sequela. The patient is tolerating medications without difficulties and is otherwise without complaint today.    Past Medical History:  Diagnosis Date  . AICD (automatic cardioverter/defibrillator) present X 4  . Headache    "weekly" (06/15/2017)  . History of kidney stones   . Hypertr obst cardiomyop   . ICD--[employer]    Dr. Graciela Husbands  . Orthostatic lightheadedness   . SYNCOPE   . TIREDNESS   . Ventricular tachycardia (HCC)    rx via ICD   Past Surgical History:  Procedure Laterality Date  . CARDIAC CATHETERIZATION    . CARDIAC DEFIBRILLATOR PLACEMENT  ~ 2000; ~ 2010   "I've had 4"  (06/15/2017)  . CARDIOVERSION  06/29/2016   Procedure: Cardioversion;  Surgeon: Duke Salvia, MD;  Location: [facility] INVASIVE CV LAB;  Service: Cardiovascular;;  . CARDIOVERSION N/A 06/17/2017   Procedure: CARDIOVERSION;  Surgeon: Jake Bathe, MD;  Location: [facility] ENDOSCOPY;  Service: Cardiovascular;  Laterality: N/A;  . Guidant Vitality ICD Impantation--Guidant Vitality T135--11/13/2003    . HYDROCELE EXCISION / REPAIR Left 04/2013  . ICD GENERATOR CHANGEOUT N/A 06/29/2016   Procedure: ICD Generator Changeout;  Surgeon: Duke Salvia, MD;  Location: [facility] INVASIVE CV LAB;  Service: Cardiovascular;  Laterality: N/A;  . LAPAROSCOPIC CHOLECYSTECTOMY  2003  . LITHOTRIPSY  X 1     Current Outpatient Medications  Medication Sig Dispense Refill  . apixaban (ELIQUIS) 5 MG TABS tablet Take 1 tablet (5 mg total) by mouth 2 (two) times daily. 60 tablet 11  . atenolol (TENORMIN) 25 MG tablet Take 3 tablets (75 mg total) by mouth daily. 270 tablet 3  . dofetilide (TIKOSYN) 500 MCG capsule Take 1 capsule (500 mcg total) by mouth 2 (two) times daily. 60 capsule 4  . ibuprofen (ADVIL,MOTRIN) 200 MG tablet Take 800 mg by mouth every 4 (four) hours as needed for headache or moderate pain.     Marland Kitchen loratadine (CLARITIN) 10 MG tablet Take 10 mg by mouth daily as needed for allergies.     Marland Kitchen losartan (COZAAR) 25 MG tablet Take 1 tablet (25 mg total) by mouth daily. 30 tablet  11   No current facility-administered medications for this visit.     Allergies:   Patient has no known allergies.   Social History:  The patient  reports that he has never smoked. He quit smokeless tobacco use about 24 years ago. His smokeless tobacco use included chew. He reports that he drinks about 1.8 oz of alcohol per week. He reports that he does not use drugs.   Family History:  The patient's family history includes ALS in his father; Heart attack in his brother; Hypertension in his unknown relative.   Brother died suddenly in his 63s.   Mother has afib   ROS:  Please see the history of present illness.   All other systems are personally reviewed and negative.    PHYSICAL EXAM: VS:  BP 124/70   Pulse (!) 54   Ht 5\' 7"  (1.702 m)   Wt 196 lb (88.9 kg)   BMI 30.70 kg/m  , BMI Body mass index is 30.7 kg/m. GEN: Well nourished, well developed, in no acute distress  HEENT: normal  Neck: no JVD, carotid bruits, or masses Cardiac: RRR; no murmurs, rubs, or gallops,no edema  Respiratory:  clear to auscultation bilaterally, normal work of breathing GI: soft, nontender, nondistended, + BS MS: no deformity or atrophy  Skin: warm and dry, device pocket is well healed Neuro:  Strength and sensation are intact Psych: euthymic mood, full affect  EKG:  EKG is ordered today. The ekg ordered today is personally reviewed and shows sinus rhythm 54 bpm, PR 248 msec, QRS 156 msec with LBBB, Qtc 550 msec  Device interrogation is personally reviewed today in detail.  See PaceArt for details.   Recent Labs: 06/25/2017: BUN 11; Creatinine, Ser 0.89; Hemoglobin 16.6; Magnesium 2.1; Platelets 178; Potassium 4.3; Sodium 139  personally reviewed   Lipid Panel  No results found for: CHOL, TRIG, HDL, CHOLHDL, VLDL, LDLCALC, LDLDIRECT personally reviewed   Wt Readings from Last 3 Encounters:  08/04/17 196 lb (88.9 kg)  07/15/17 197 lb (89.4 kg)  06/29/17 195 lb (88.5 kg)      Other studies Reviewed: Additional studies/ records that were personally reviewed today include: Dr Koren Bound notes, afib clinic notes, prior echo 06/15/16  Review of the above records today demonstrates: HCM, without obstruction, mild AI, moderate MR, severe LA enlargement.  LA volume is 135 ml.   ASSESSMENT AND PLAN:  1.  Persistent atrial fibrillation in the setting of severe LA enlargement and HCM. The patient has symptomatic, recurrent persistent atrial fibrillation. he has failed medical therapy with tikosyn, though he actually feels that he is doing well  currently.   he is anticoagulated with eliquis .  chads2vasc score is 1. Therapeutic strategies for afib including medicine and ablation were discussed in detail with the patient today. Risk, benefits, and alternatives to EP study and radiofrequency ablation for afib were also discussed in detail today.  He is aware that anticipated success rates would be 50-60% given his persistent afib with severe LA enlargement and HCM.  He has perhaps a 1/3 likelihood of requiring multiple procedures. At this time, he wishes to continue medical therapy.  He will think about ablation and contact our office if he decides to proceed.  2. Snoring Previous outpatient sleep study was inconclusive Per his request, will refer to Dr Earl Gala for sleep evaluation  3. ETOH Avoidance encouraged  4. First degree AB block with LBBB.   He clearly has conduction system disease.  If his afib becomes  more refractory, AV nodal ablation with upgrade to CRT-D by Dr Graciela Husbands would be a reasonable option, given severe LA enlargement  5. HTN Stable No change required today  Follow-up with Dr Graciela Husbands in 3 months I will see as needed He will contact my office if he decides to proceed with ablation  Current medicines are reviewed at length with the patient today.   The patient does not have concerns regarding his medicines.  The following changes were made today:  none  Labs/ tests ordered today include:  Orders Placed This Encounter  Procedures  . Ambulatory referral to Sleep Studies  . EKG 12-Lead   Today, I have spent 40  minutes with the patient discussing afib management .  More than 50% of the visit time today was spent on this issue.    Randolm Idol, MD  08/04/2017 11:29 AM     [facility] [address] [place] [address] [phone] (office) [phone] (fax)

## 2017-08-04 NOTE — Progress Notes (Signed)
Remote ICD transmission.   

## 2017-08-04 NOTE — Patient Instructions (Addendum)
Medication Instructions:  Your physician recommends that you continue on your current medications as directed. Please refer to the Current Medication list given to you today.  Labwork: None ordered.  Testing/Procedures: None ordered.  Follow-Up:  Your physician wants you to follow-up in: 3 months with Dr. Graciela Husbands.      Any Other Special Instructions Will Be Listed Below (If Applicable).  Referral made to Benjaman Kindler for sleep eval.  If you need a refill on your cardiac medications before your next appointment, please call your pharmacy.   If you decide to go ahead with an afib ablation call office and ask for Boneta Lucks RN for Dr. Johney Frame. [phone]   Cardiac Ablation Cardiac ablation is a procedure to disable (ablate) a small amount of heart tissue in very specific places. The heart has many electrical connections. Sometimes these connections are abnormal and can cause the heart to beat very fast or irregularly. Ablating some of the problem areas can improve the heart rhythm or return it to normal. Ablation may be done for people who:  Have Wolff-Parkinson-White syndrome.  Have fast heart rhythms (tachycardia).  Have taken medicines for an abnormal heart rhythm (arrhythmia) that were not effective or caused side effects.  Have a high-risk heartbeat that may be life-threatening.  During the procedure, a small incision is made in the neck or the groin, and a long, thin, flexible tube (catheter) is inserted into the incision and moved to the heart. Small devices (electrodes) on the tip of the catheter will send out electrical currents. A type of X-ray (fluoroscopy) will be used to help guide the catheter and to provide images of the heart. Tell a health care provider about:  Any allergies you have.  All medicines you are taking, including vitamins, herbs, eye drops, creams, and over-the-counter medicines.  Any problems you or family members have had with anesthetic  medicines.  Any blood disorders you have.  Any surgeries you have had.  Any medical conditions you have, such as kidney failure.  Whether you are pregnant or may be pregnant. What are the risks? Generally, this is a safe procedure. However, problems may occur, including:  Infection.  Bruising and bleeding at the catheter insertion site.  Bleeding into the chest, especially into the sac that surrounds the heart. This is a serious complication.  Stroke or blood clots.  Damage to other structures or organs.  Allergic reaction to medicines or dyes.  Need for a permanent pacemaker if the normal electrical system is damaged. A pacemaker is a small computer that sends electrical signals to the heart and helps your heart beat normally.  The procedure not being fully effective. This may not be recognized until months later. Repeat ablation procedures are sometimes required.  What happens before the procedure?  Follow instructions from your health care provider about eating or drinking restrictions.  Ask your health care provider about: ? Changing or stopping your regular medicines. This is especially important if you are taking diabetes medicines or blood thinners. ? Taking medicines such as aspirin and ibuprofen. These medicines can thin your blood. Do not take these medicines before your procedure if your health care provider instructs you not to.  Plan to have someone take you home from the hospital or clinic.  If you will be going home right after the procedure, plan to have someone with you for 24 hours. What happens during the procedure?  To lower your risk of infection: ? Your health care team will wash or  sanitize their hands. ? Your skin will be washed with soap. ? Hair may be removed from the incision area.  An IV tube will be inserted into one of your veins.  You will be given a medicine to help you relax (sedative).  The skin on your neck or groin will be  numbed.  An incision will be made in your neck or your groin.  A needle will be inserted through the incision and into a large vein in your neck or groin.  A catheter will be inserted into the needle and moved to your heart.  Dye may be injected through the catheter to help your surgeon see the area of the heart that needs treatment.  Electrical currents will be sent from the catheter to ablate heart tissue in desired areas. There are three types of energy that may be used to ablate heart tissue: ? Heat (radiofrequency energy). ? Laser energy. ? Extreme cold (cryoablation).  When the necessary tissue has been ablated, the catheter will be removed.  Pressure will be held on the catheter insertion area to prevent excessive bleeding.  A bandage (dressing) will be placed over the catheter insertion area. The procedure may vary among health care providers and hospitals. What happens after the procedure?  Your blood pressure, heart rate, breathing rate, and blood oxygen level will be monitored until the medicines you were given have worn off.  Your catheter insertion area will be monitored for bleeding. You will need to lie still for a few hours to ensure that you do not bleed from the catheter insertion area.  Do not drive for 24 hours or as long as directed by your health care provider. Summary  Cardiac ablation is a procedure to disable (ablate) a small amount of heart tissue in very specific places. Ablating some of the problem areas can improve the heart rhythm or return it to normal.  During the procedure, electrical currents will be sent from the catheter to ablate heart tissue in desired areas. This information is not intended to replace advice given to you by your health care provider. Make sure you discuss any questions you have with your health care provider. Document Released: 07/05/2008 Document Revised: 01/06/2016 Document Reviewed: 01/06/2016 Elsevier Interactive Patient  Education  [employer].

## 2017-09-23 LAB — CUP PACEART REMOTE DEVICE CHECK
Battery Remaining Longevity: 144 mo
Battery Remaining Percentage: 100 %
Brady Statistic RV Percent Paced: 0 %
Date Time Interrogation Session: 20190605080100
HighPow Impedance: 53 Ohm
Implantable Lead Implant Date: 20000815
Implantable Lead Location: 753860
Implantable Lead Model: 144
Implantable Lead Serial Number: 334759
Implantable Pulse Generator Implant Date: 20180430
Lead Channel Impedance Value: 858 Ohm
Lead Channel Pacing Threshold Amplitude: 0.8 V
Lead Channel Pacing Threshold Pulse Width: 0.4 ms
Lead Channel Setting Pacing Amplitude: 2.5 V
Lead Channel Setting Pacing Pulse Width: 0.4 ms
Lead Channel Setting Sensing Sensitivity: 0.5 mV
Pulse Gen Serial Number: 198978

## 2017-10-19 ENCOUNTER — Encounter: Payer: Self-pay | Admitting: Internal Medicine

## 2017-10-25 ENCOUNTER — Other Ambulatory Visit: Payer: Self-pay | Admitting: Internal Medicine

## 2017-10-25 MED ORDER — DOFETILIDE 500 MCG PO CAPS: 500.0000 ug | ORAL_CAPSULE | Freq: Two times a day (BID) | ORAL | 9 refills | Status: DC

## 2017-11-03 ENCOUNTER — Ambulatory Visit (INDEPENDENT_AMBULATORY_CARE_PROVIDER_SITE_OTHER): Payer: BLUE CROSS/BLUE SHIELD | Admitting: *Deleted

## 2017-11-03 DIAGNOSIS — I472 Ventricular tachycardia, unspecified: Secondary | ICD-10-CM

## 2017-11-03 DIAGNOSIS — I421 Obstructive hypertrophic cardiomyopathy: Secondary | ICD-10-CM

## 2017-11-03 NOTE — Progress Notes (Signed)
Remote ICD transmission.   

## 2017-11-10 ENCOUNTER — Encounter: Payer: Self-pay | Admitting: Internal Medicine

## 2017-11-10 ENCOUNTER — Ambulatory Visit (INDEPENDENT_AMBULATORY_CARE_PROVIDER_SITE_OTHER): Payer: BLUE CROSS/BLUE SHIELD | Admitting: Internal Medicine

## 2017-11-10 VITALS — BP 110/72 | HR 54 | Ht 67.0 in | Wt 191.8 lb

## 2017-11-10 DIAGNOSIS — Z95810 Presence of automatic (implantable) cardiac defibrillator: Secondary | ICD-10-CM | POA: Diagnosis not present

## 2017-11-10 DIAGNOSIS — I421 Obstructive hypertrophic cardiomyopathy: Secondary | ICD-10-CM

## 2017-11-10 DIAGNOSIS — I481 Persistent atrial fibrillation: Secondary | ICD-10-CM | POA: Diagnosis not present

## 2017-11-10 DIAGNOSIS — I472 Ventricular tachycardia, unspecified: Secondary | ICD-10-CM

## 2017-11-10 DIAGNOSIS — I4819 Other persistent atrial fibrillation: Secondary | ICD-10-CM

## 2017-11-10 DIAGNOSIS — G4730 Sleep apnea, unspecified: Secondary | ICD-10-CM | POA: Diagnosis not present

## 2017-11-10 LAB — CUP PACEART INCLINIC DEVICE CHECK
Brady Statistic RV Percent Paced: 1 % — CL
Date Time Interrogation Session: 20190911040000
HighPow Impedance: 54 Ohm
Implantable Lead Implant Date: 20000815
Implantable Lead Location: 753860
Implantable Lead Model: 144
Implantable Lead Serial Number: 334759
Implantable Pulse Generator Implant Date: 20180430
Lead Channel Impedance Value: 890 Ohm
Lead Channel Pacing Threshold Amplitude: 0.7 V
Lead Channel Pacing Threshold Pulse Width: 0.4 ms
Lead Channel Sensing Intrinsic Amplitude: 25 mV
Lead Channel Setting Pacing Amplitude: 2.5 V
Lead Channel Setting Pacing Pulse Width: 0.4 ms
Lead Channel Setting Sensing Sensitivity: 0.5 mV
Pulse Gen Serial Number: 198978

## 2017-11-10 LAB — BASIC METABOLIC PANEL
BUN/Creatinine Ratio: 13 (ref 9–20)
BUN: 13 mg/dL (ref 6–24)
CO2: 24 mmol/L (ref 20–29)
Calcium: 9.9 mg/dL (ref 8.7–10.2)
Chloride: 99 mmol/L (ref 96–106)
Creatinine, Ser: 0.99 mg/dL (ref 0.76–1.27)
GFR calc Af Amer: 98 mL/min/{1.73_m2} (ref >59)
GFR calc non Af Amer: 85 mL/min/{1.73_m2} (ref >59)
Glucose: 82 mg/dL (ref 65–99)
Potassium: 4.2 mmol/L (ref 3.5–5.2)
Sodium: 137 mmol/L (ref 134–144)

## 2017-11-10 LAB — CBC
Hematocrit: 44.3 % (ref 37.5–51.0)
Hemoglobin: 15.5 g/dL (ref 13.0–17.7)
MCH: 31.1 pg (ref 26.6–33.0)
MCHC: 35 g/dL (ref 31.5–35.7)
MCV: 89 fL (ref 79–97)
Platelets: 197 10*3/uL (ref 150–450)
RBC: 4.98 x10E6/uL (ref 4.14–5.80)
RDW: 13.2 % (ref 12.3–15.4)
WBC: 6.9 10*3/uL (ref 3.4–10.8)

## 2017-11-10 LAB — MAGNESIUM: Magnesium: 2.2 mg/dL (ref 1.6–2.3)

## 2017-11-10 NOTE — Progress Notes (Signed)
Patient Care Team: Medicine, [facility] as PCP - General (Family Medicine)   HPI  Donald Bond is a 57 y.o. male Seen in followup for HCM associated with syncope and previously treated ventricular tachycardia;  He has had 3 previous devices.  Underwent gen change 4  2018 w AEGIS pouch support.   He has persistent afib; on apixoban . He underwent cardioversion at the time of his device change He has no awareness of palpitations or changes in exercise tolerance.       DATE TEST    4/18    Echo   EF normal % LAE (49/2.4/70)               He has had problems with Afib and RVR with reaching into (VF zone > 240 bpm)  Atenolol was increased   Unfortunately he has continued to have episodes detected by his device > 210/240  Dofetilide initiated 4/19  Saw JA 6/19 for consideration of catheter ablation>> declined   Date Cr  K Mg Hgb  9/18 0.95 4.3  15.5  4/19  0.89 4.3 2.1 16.6         He is considerably better with maintained sinus rhythm  No edema, less sob and no chest pain; better energy  Past Medical History:  Diagnosis Date  . AICD (automatic cardioverter/defibrillator) present X 4  . Headache    "weekly" (06/15/2017)  . History of kidney stones   . Hypertr obst cardiomyop   . ICD--[employer]    Dr. Graciela Husbands  . Orthostatic lightheadedness   . SYNCOPE   . TIREDNESS   . Ventricular tachycardia (HCC)    rx via ICD    Past Surgical History:  Procedure Laterality Date  . CARDIAC CATHETERIZATION    . CARDIAC DEFIBRILLATOR PLACEMENT  ~ 2000; ~ 2010   "I've had 4" (06/15/2017)  . CARDIOVERSION  06/29/2016   Procedure: Cardioversion;  Surgeon: Duke Salvia, MD;  Location: [facility] INVASIVE CV LAB;  Service: Cardiovascular;;  . CARDIOVERSION N/A 06/17/2017   Procedure: CARDIOVERSION;  Surgeon: Jake Bathe, MD;  Location: [facility] ENDOSCOPY;  Service: Cardiovascular;  Laterality: N/A;  . Guidant Vitality ICD Impantation--Guidant Vitality T135--11/13/2003      . HYDROCELE EXCISION / REPAIR Left 04/2013  . ICD GENERATOR CHANGEOUT N/A 06/29/2016   Procedure: ICD Generator Changeout;  Surgeon: Duke Salvia, MD;  Location: [facility] INVASIVE CV LAB;  Service: Cardiovascular;  Laterality: N/A;  . LAPAROSCOPIC CHOLECYSTECTOMY  2003  . LITHOTRIPSY  X 1    Current Outpatient Medications  Medication Sig Dispense Refill  . apixaban (ELIQUIS) 5 MG TABS tablet Take 1 tablet (5 mg total) by mouth 2 (two) times daily. 60 tablet 11  . atenolol (TENORMIN) 25 MG tablet Take 3 tablets (75 mg total) by mouth daily. 270 tablet 3  . dofetilide (TIKOSYN) 500 MCG capsule Take 1 capsule (500 mcg total) by mouth 2 (two) times daily. 60 capsule 9  . ibuprofen (ADVIL,MOTRIN) 200 MG tablet Take 800 mg by mouth every 4 (four) hours as needed for headache or moderate pain.     Marland Kitchen loratadine (CLARITIN) 10 MG tablet Take 10 mg by mouth daily as needed for allergies.     Marland Kitchen losartan (COZAAR) 25 MG tablet Take 1 tablet (25 mg total) by mouth daily. 30 tablet 11   No current facility-administered medications for this visit.     No Known Allergies  Review of Systems negative except  from HPI and PMH  Physical Exam BP 110/72   Pulse (!) 54   Ht 5\' 7"  (1.702 m)   Wt 191 lb 12.8 oz (87 kg)   SpO2 97%   BMI 30.04 kg/m  Well developed and nourished in no acute distress HENT normal Neck supple with JVP-flat Clear Device pocket well healed; without hematoma or erythema.  There is no tethering   Regular rate and rhythm, no murmurs or gallops Abd-soft with active BS No Clubbing cyanosis edema Skin-warm and dry A & Oriented  Grossly normal sensory and motor function    ECG sinus @ 54 25/15/57  Assessment and plan  Hypertrophic cardio myopathy    Ventricular tachycardia with appropriate therapy   LBBB  1AVB   Inappropriate therapy  Atrial Fibrillation-persistent long-term  PVCs  Sleep disordered breathing and daytime somnolence  Status post ICD implantation-[employer]  The patient's device was interrogated.  The information was reviewed. No changes were made in the programming.     Holding sinus; would like to continue medical therapy for now   Need to check surveillance labs   On Anticoagulation;  No bleeding issues   ,Euvolemic continue current meds

## 2017-11-10 NOTE — Patient Instructions (Signed)
Medication Instructions:  Your physician recommends that you continue on your current medications as directed. Please refer to the Current Medication list given to you today.  Labwork: You will have labs drawn today: Mg, CBC, and BMP   Testing/Procedures: None ordered.  Follow-Up: Your physician wants you to follow-up in: 6 months with Gypsy Balsam, NP. You will receive a reminder letter in the mail two months in advance. If you don't receive a letter, please call our office to schedule the follow-up appointment.  Remote monitoring is used to monitor your ICD from home. This monitoring reduces the number of office visits required to check your device to one time per year. It allows [place] to keep an eye on the functioning of your device to ensure it is working properly. You are scheduled for a device check from home on 02/02/2018. You may send your transmission at any time that day. If you have a wireless device, the transmission will be sent automatically. After your physician reviews your transmission, you will receive a postcard with your next transmission date.    Any Other Special Instructions Will Be Listed Below (If Applicable).     If you need a refill on your cardiac medications before your next appointment, please call your pharmacy.

## 2017-11-30 LAB — CUP PACEART REMOTE DEVICE CHECK
Date Time Interrogation Session: 20191001061750
Implantable Lead Implant Date: 20000815
Implantable Lead Location: 753860
Implantable Lead Model: 144
Implantable Lead Serial Number: 334759
Implantable Pulse Generator Implant Date: 20180430
Pulse Gen Serial Number: 198978

## 2018-02-02 ENCOUNTER — Ambulatory Visit (INDEPENDENT_AMBULATORY_CARE_PROVIDER_SITE_OTHER): Payer: BLUE CROSS/BLUE SHIELD

## 2018-02-02 DIAGNOSIS — I421 Obstructive hypertrophic cardiomyopathy: Secondary | ICD-10-CM

## 2018-02-02 DIAGNOSIS — I472 Ventricular tachycardia, unspecified: Secondary | ICD-10-CM

## 2018-02-02 NOTE — Progress Notes (Signed)
Remote ICD transmission.   

## 2018-02-08 ENCOUNTER — Encounter: Payer: Self-pay | Admitting: Cardiology

## 2018-03-23 LAB — CUP PACEART REMOTE DEVICE CHECK
Battery Remaining Longevity: 144 mo
Battery Remaining Percentage: 100 %
Brady Statistic RV Percent Paced: 0 %
Date Time Interrogation Session: 20191204090100
HighPow Impedance: 48 Ohm
Implantable Lead Implant Date: 20000815
Implantable Lead Location: 753860
Implantable Lead Model: 144
Implantable Lead Serial Number: 334759
Implantable Pulse Generator Implant Date: 20180430
Lead Channel Impedance Value: 868 Ohm
Lead Channel Pacing Threshold Amplitude: 0.7 V
Lead Channel Pacing Threshold Pulse Width: 0.4 ms
Lead Channel Setting Pacing Amplitude: 2.5 V
Lead Channel Setting Pacing Pulse Width: 0.4 ms
Lead Channel Setting Sensing Sensitivity: 0.5 mV
Pulse Gen Serial Number: 198978

## 2018-03-25 ENCOUNTER — Other Ambulatory Visit: Payer: Self-pay | Admitting: Internal Medicine

## 2018-04-09 DIAGNOSIS — B028 Zoster with other complications: Secondary | ICD-10-CM | POA: Diagnosis not present

## 2018-04-12 DIAGNOSIS — Z125 Encounter for screening for malignant neoplasm of prostate: Secondary | ICD-10-CM | POA: Diagnosis not present

## 2018-04-12 DIAGNOSIS — B029 Zoster without complications: Secondary | ICD-10-CM | POA: Diagnosis not present

## 2018-04-12 DIAGNOSIS — I422 Other hypertrophic cardiomyopathy: Secondary | ICD-10-CM | POA: Diagnosis not present

## 2018-04-12 DIAGNOSIS — I10 Essential (primary) hypertension: Secondary | ICD-10-CM | POA: Diagnosis not present

## 2018-04-12 DIAGNOSIS — I482 Chronic atrial fibrillation, unspecified: Secondary | ICD-10-CM | POA: Diagnosis not present

## 2018-04-22 ENCOUNTER — Encounter: Payer: Self-pay | Admitting: Nurse Practitioner

## 2018-05-04 ENCOUNTER — Ambulatory Visit (INDEPENDENT_AMBULATORY_CARE_PROVIDER_SITE_OTHER): Payer: BLUE CROSS/BLUE SHIELD | Admitting: *Deleted

## 2018-05-04 DIAGNOSIS — I472 Ventricular tachycardia, unspecified: Secondary | ICD-10-CM

## 2018-05-04 DIAGNOSIS — I421 Obstructive hypertrophic cardiomyopathy: Secondary | ICD-10-CM

## 2018-05-07 LAB — CUP PACEART REMOTE DEVICE CHECK
Battery Remaining Longevity: 144 mo
Battery Remaining Percentage: 100 %
Brady Statistic RV Percent Paced: 0 %
Date Time Interrogation Session: 20200304090000
HighPow Impedance: 51 Ohm
Implantable Lead Implant Date: 20000815
Implantable Lead Location: 753860
Implantable Lead Model: 144
Implantable Lead Serial Number: 334759
Implantable Pulse Generator Implant Date: 20180430
Lead Channel Impedance Value: 890 Ohm
Lead Channel Pacing Threshold Amplitude: 0.7 V
Lead Channel Pacing Threshold Pulse Width: 0.4 ms
Lead Channel Setting Pacing Amplitude: 2.5 V
Lead Channel Setting Pacing Pulse Width: 0.4 ms
Lead Channel Setting Sensing Sensitivity: 0.5 mV
Pulse Gen Serial Number: 198978

## 2018-05-11 ENCOUNTER — Encounter: Payer: BLUE CROSS/BLUE SHIELD | Admitting: Nurse Practitioner

## 2018-05-12 NOTE — Progress Notes (Signed)
Remote ICD transmission.   

## 2018-05-24 ENCOUNTER — Telehealth: Payer: Self-pay

## 2018-05-24 NOTE — Telephone Encounter (Signed)
Spoke with pt who agrees to virtual visit with Dr Graciela Husbands on 3/30. He understands the MyChart message will be sent with consent and he will receive an additional email for his meeting invite.

## 2018-05-24 NOTE — Telephone Encounter (Signed)
LVM to discuss rescheduling or virtual visit.

## 2018-05-30 ENCOUNTER — Telehealth: Payer: Self-pay | Admitting: Internal Medicine

## 2018-05-30 ENCOUNTER — Telehealth (INDEPENDENT_AMBULATORY_CARE_PROVIDER_SITE_OTHER): Payer: BLUE CROSS/BLUE SHIELD | Admitting: Internal Medicine

## 2018-05-30 VITALS — Ht 67.0 in | Wt 185.0 lb

## 2018-05-30 DIAGNOSIS — Z95810 Presence of automatic (implantable) cardiac defibrillator: Secondary | ICD-10-CM | POA: Diagnosis not present

## 2018-05-30 DIAGNOSIS — I421 Obstructive hypertrophic cardiomyopathy: Secondary | ICD-10-CM | POA: Diagnosis not present

## 2018-05-30 DIAGNOSIS — I472 Ventricular tachycardia, unspecified: Secondary | ICD-10-CM

## 2018-05-30 DIAGNOSIS — I4819 Other persistent atrial fibrillation: Secondary | ICD-10-CM | POA: Diagnosis not present

## 2018-05-30 MED ORDER — APIXABAN 5 MG PO TABS: 5.0000 mg | ORAL_TABLET | Freq: Two times a day (BID) | ORAL | 11 refills | Status: DC

## 2018-05-30 NOTE — Telephone Encounter (Signed)
Disregard opened in error °

## 2018-05-30 NOTE — Progress Notes (Signed)
Electrophysiology TeleHealth Note   Due to national recommendations of social distancing due to COVID 19, an audio/video telehealth visit is felt to be most appropriate for this patient at this time.  See MyChart message from today for the patient's consent to telehealth for [facility].   Date:  05/30/2018   ID:  Donald Bond, DOB 07/06/1960, MRN [id number]  Location: patient's home  Provider location: [address] [place]  Evaluation Performed: Follow-up visit  PCP:  Medicine, [facility]  Cardiologist:   Electrophysiologist:  SK  Chief Complaint:  HCM ICD  History of Present Illness:    Donald Bond is a 58 y.o. male who presents via audio/video conferencing for a telehealth visit today.    HCM with ICD Medtronic for primary prevention. He has had problems with Afib and RVR with reaching into (VF zone > 240 bpm)  Atenolol was increased   Unfortunately he has continued to have episodes detected by his device > 210/240  Dofetilide initiated 4/19  Saw JA 6/19 for consideration of catheter ablation>> declined  No afib of which he is aware  No bleeding    The patient denies chest pain, shortness of breath, nocturnal dyspnea, orthopnea or peripheral edema.  There have been no palpitations, lightheadedness or syncope.    Intercurrent case of shingles which is gradually improving and thankfully with little pain     DATE TEST    4/18    Echo   EF normal % LAE (49/2.4/70)                 Date Cr  K Mg Hgb  9/18 0.95 4.3  15.5  4/19  0.89 4.3 2.1 16.6  9/19 0.99 4.2 2.2   2/20 0.91 4.5  45.3 (Hct)            The patient denies symptoms of fevers, chills, cough, or new SOB worrisome for COVID 19.    Past Medical History:  Diagnosis Date  . AICD (automatic cardioverter/defibrillator) present X 4  . Headache    "weekly" (06/15/2017)  . History of kidney stones   . Hypertr obst cardiomyop   . ICD--St  Judes    Dr. Graciela Husbands  . Orthostatic lightheadedness   . SYNCOPE   . TIREDNESS   . Ventricular tachycardia (HCC)    rx via ICD    Past Surgical History:  Procedure Laterality Date  . CARDIAC CATHETERIZATION    . CARDIAC DEFIBRILLATOR PLACEMENT  ~ 2000; ~ 2010   "I've had 4" (06/15/2017)  . CARDIOVERSION  06/29/2016   Procedure: Cardioversion;  Surgeon: Duke Salvia, MD;  Location: [facility] INVASIVE CV LAB;  Service: Cardiovascular;;  . CARDIOVERSION N/A 06/17/2017   Procedure: CARDIOVERSION;  Surgeon: Jake Bathe, MD;  Location: [facility] ENDOSCOPY;  Service: Cardiovascular;  Laterality: N/A;  . Guidant Vitality ICD Impantation--Guidant Vitality T135--11/13/2003    . HYDROCELE EXCISION / REPAIR Left 04/2013  . ICD GENERATOR CHANGEOUT N/A 06/29/2016   Procedure: ICD Generator Changeout;  Surgeon: Duke Salvia, MD;  Location: [facility] INVASIVE CV LAB;  Service: Cardiovascular;  Laterality: N/A;  . LAPAROSCOPIC CHOLECYSTECTOMY  2003  . LITHOTRIPSY  X 1    Current Outpatient Medications  Medication Sig Dispense Refill  . apixaban (ELIQUIS) 5 MG TABS tablet Take 1 tablet (5 mg total) by mouth 2 (two) times daily. 60 tablet 11  . atenolol (TENORMIN) 25 MG tablet TAKE 3 TABLETS BY MOUTH DAILY 270  tablet 2  . dofetilide (TIKOSYN) 500 MCG capsule Take 1 capsule (500 mcg total) by mouth 2 (two) times daily. 60 capsule 9  . ibuprofen (ADVIL,MOTRIN) 200 MG tablet Take 800 mg by mouth every 4 (four) hours as needed for headache or moderate pain.     Marland Kitchen loratadine (CLARITIN) 10 MG tablet Take 10 mg by mouth daily as needed for allergies.     Marland Kitchen losartan (COZAAR) 25 MG tablet Take 1 tablet (25 mg total) by mouth daily. 30 tablet 11   No current facility-administered medications for this visit.     Allergies:   Patient has no known allergies.   Social History:  The patient  reports that he has never smoked. He quit smokeless tobacco use about 25 years ago.  His smokeless tobacco use included chew. He reports  current alcohol use of about 3.0 standard drinks of alcohol per week. He reports that he does not use drugs.   Family History:  The patient's   family history includes ALS in his father; Heart attack in his brother; Hypertension in an other family member.   ROS:  Please see the history of present illness.   All other systems are personally reviewed and negative.    Exam:    Vital Signs:  Ht 5\' 7"  (1.702 m)   Wt 185 lb (83.9 kg)   BMI 28.98 kg/m     125/75  Well appearing, alert and conversant, regular work of breathing,  good skin color Eyes- anicteric, neuro- grossly intact, skin- no apparent rash or lesions or cyanosis, mouth- oral mucosa is pink   Labs/Other Tests and Data Reviewed:    Recent Labs: 11/10/2017: BUN 13; Creatinine, Ser 0.99; Hemoglobin 15.5; Magnesium 2.2; Platelets 197; Potassium 4.2; Sodium 137   Wt Readings from Last 3 Encounters:  05/30/18 185 lb (83.9 kg)  11/10/17 191 lb 12.8 oz (87 kg)  08/04/17 196 lb (88.9 kg)     Other studies personally reviewed:    Last device remote is reviewed from [employer] PDF dated 3/20 which reveals normal device function, no arrhythmias-sustained;  Nonsustained VT present     ASSESSMENT & PLAN:     Hypertrophic cardio myopathy    Ventricular tachycardia with appropriate therapy   LBBB  1AVB   Inappropriate therapy  Atrial Fibrillation-persistent long-term  PVCs  Sleep disordered breathing and daytime somnolence  Status post ICD implantation-[employer]  No intercurrent Ventricular tachycardia  No intercurrent atrial fibrillation or flutter  On Anticoagulation;  No bleeding issues   No volume overload          COVID 19 screen The patient denies symptoms of COVID 19 at this time.  The importance of social distancing was discussed today.  Follow-up: 9/20 Next remote: 6/20   Current medicines are reviewed at length with the patient today.   The patient does not have concerns  regarding his medicines.  The following changes were made today:  none  Labs/ tests ordered today include: none No orders of the defined types were placed in this encounter.   Future tests ( post COVID )  bmet 6 months at followup   Patient Risk:  after full review of this patients clinical status, I feel that they are at moderate risk at this time.  Today, I have spent 26  minutes with the patient with telehealth technology discussing As above  .    Signed, Sherryl Manges, MD  05/30/2018 2:11 PM     [facility] [address] [place] [address] [phone] (office) [phone] (fax)

## 2018-06-20 ENCOUNTER — Encounter: Payer: BLUE CROSS/BLUE SHIELD | Admitting: Internal Medicine

## 2018-08-02 ENCOUNTER — Other Ambulatory Visit: Payer: Self-pay | Admitting: Internal Medicine

## 2018-08-02 MED ORDER — LOSARTAN POTASSIUM 25 MG PO TABS: 25.0000 mg | ORAL_TABLET | Freq: Every day | ORAL | 11 refills | Status: DC

## 2018-08-02 NOTE — Telephone Encounter (Signed)
Pt's medication was sent to pt's pharmacy as requested. Confirmation received.  °

## 2018-08-03 ENCOUNTER — Ambulatory Visit (INDEPENDENT_AMBULATORY_CARE_PROVIDER_SITE_OTHER): Payer: BC Managed Care – PPO | Admitting: *Deleted

## 2018-08-03 DIAGNOSIS — I472 Ventricular tachycardia, unspecified: Secondary | ICD-10-CM

## 2018-08-03 LAB — CUP PACEART REMOTE DEVICE CHECK
Battery Remaining Longevity: 144 mo
Battery Remaining Percentage: 100 %
Brady Statistic RV Percent Paced: 0 %
Date Time Interrogation Session: 20200603080100
HighPow Impedance: 50 Ohm
Implantable Lead Implant Date: 20000815
Implantable Lead Location: 753860
Implantable Lead Model: 144
Implantable Lead Serial Number: 334759
Implantable Pulse Generator Implant Date: 20180430
Lead Channel Impedance Value: 862 Ohm
Lead Channel Pacing Threshold Amplitude: 0.7 V
Lead Channel Pacing Threshold Pulse Width: 0.4 ms
Lead Channel Setting Pacing Amplitude: 2.5 V
Lead Channel Setting Pacing Pulse Width: 0.4 ms
Lead Channel Setting Sensing Sensitivity: 0.5 mV
Pulse Gen Serial Number: 198978

## 2018-08-11 ENCOUNTER — Encounter: Payer: Self-pay | Admitting: Cardiology

## 2018-08-11 NOTE — Progress Notes (Signed)
Remote ICD transmission.   

## 2018-08-22 ENCOUNTER — Telehealth: Payer: Self-pay | Admitting: Internal Medicine

## 2018-08-22 NOTE — Telephone Encounter (Signed)
  Pt c/o medication issue:  1. Name of Medication: dofetilide (TIKOSYN) 500 MCG capsule  2. How are you currently taking this medication (dosage and times per day)? As directed  3. Are you having a reaction (difficulty breathing--STAT)?  NA  4. What is your medication issue? Patient states that his insurance plan is saying that this medication is now on his medical not pharmacy plan and he has to pay a deductible. He cannot afford to purchase the medication and he does not understand why it is saying this. He would like to know what his options are.

## 2018-08-23 NOTE — Telephone Encounter (Signed)
**Note De-Identified Gionni Vaca Obfuscation** I left a message on the pts VM asking him to call me back to discuss his deductible.

## 2018-08-24 NOTE — Telephone Encounter (Addendum)
**Note De-Identified Edgel Degnan Obfuscation** The pt states that his new insurance plan started on 6/1 and that he was unaware that he now has a $1400.00 deductible each year.  He states that he currently has a 30 day supply of both Tikosyn and Eliquis on hand.  He states that he is working with his HR dept at his work to change the [employer] policy that he is currently on to a better one with a lower deductible. He states that if he cannot get a better plan through his work his wife is going to try to add him to her policy.  He is aware to call us back if none of the above works out and he cannot afford his Tikosyn.

## 2018-08-24 NOTE — Telephone Encounter (Signed)
No answer so I left a message on the pts VM asking him to call me back to discuss the cost of his Tikosyn as it is very important that he not miss a dose of his Tikosyn.

## 2018-08-24 NOTE — Telephone Encounter (Signed)
Patient returning call.

## 2018-09-10 DIAGNOSIS — M25562 Pain in left knee: Secondary | ICD-10-CM | POA: Diagnosis not present

## 2018-09-10 DIAGNOSIS — S8992XA Unspecified injury of left lower leg, initial encounter: Secondary | ICD-10-CM | POA: Diagnosis not present

## 2018-09-10 DIAGNOSIS — M7989 Other specified soft tissue disorders: Secondary | ICD-10-CM | POA: Diagnosis not present

## 2018-09-10 DIAGNOSIS — M1712 Unilateral primary osteoarthritis, left knee: Secondary | ICD-10-CM | POA: Diagnosis not present

## 2018-09-10 DIAGNOSIS — I10 Essential (primary) hypertension: Secondary | ICD-10-CM | POA: Diagnosis not present

## 2018-09-10 DIAGNOSIS — L03116 Cellulitis of left lower limb: Secondary | ICD-10-CM | POA: Diagnosis not present

## 2018-09-10 DIAGNOSIS — M25462 Effusion, left knee: Secondary | ICD-10-CM | POA: Diagnosis not present

## 2018-09-15 ENCOUNTER — Other Ambulatory Visit: Payer: Self-pay

## 2018-09-15 MED ORDER — DOFETILIDE 500 MCG PO CAPS: 500.0000 ug | ORAL_CAPSULE | Freq: Two times a day (BID) | ORAL | 5 refills | Status: DC

## 2018-09-16 DIAGNOSIS — L02416 Cutaneous abscess of left lower limb: Secondary | ICD-10-CM | POA: Diagnosis not present

## 2018-09-16 DIAGNOSIS — L03116 Cellulitis of left lower limb: Secondary | ICD-10-CM | POA: Diagnosis not present

## 2018-09-16 DIAGNOSIS — M7989 Other specified soft tissue disorders: Secondary | ICD-10-CM | POA: Diagnosis not present

## 2018-09-16 DIAGNOSIS — I10 Essential (primary) hypertension: Secondary | ICD-10-CM | POA: Diagnosis not present

## 2018-10-12 DIAGNOSIS — I422 Other hypertrophic cardiomyopathy: Secondary | ICD-10-CM | POA: Diagnosis not present

## 2018-10-12 DIAGNOSIS — I480 Paroxysmal atrial fibrillation: Secondary | ICD-10-CM | POA: Diagnosis not present

## 2018-10-12 DIAGNOSIS — Z0000 Encounter for general adult medical examination without abnormal findings: Secondary | ICD-10-CM | POA: Diagnosis not present

## 2018-10-12 DIAGNOSIS — I10 Essential (primary) hypertension: Secondary | ICD-10-CM | POA: Diagnosis not present

## 2018-10-12 DIAGNOSIS — I4901 Ventricular fibrillation: Secondary | ICD-10-CM | POA: Diagnosis not present

## 2018-11-04 ENCOUNTER — Ambulatory Visit (INDEPENDENT_AMBULATORY_CARE_PROVIDER_SITE_OTHER): Payer: BC Managed Care – PPO | Admitting: *Deleted

## 2018-11-04 DIAGNOSIS — I421 Obstructive hypertrophic cardiomyopathy: Secondary | ICD-10-CM

## 2018-11-07 LAB — CUP PACEART REMOTE DEVICE CHECK
Battery Remaining Longevity: 144 mo
Battery Remaining Percentage: 100 %
Brady Statistic RV Percent Paced: 0 %
Date Time Interrogation Session: 20200905161200
HighPow Impedance: 53 Ohm
Implantable Lead Implant Date: 20000815
Implantable Lead Location: 753860
Implantable Lead Model: 144
Implantable Lead Serial Number: 334759
Implantable Pulse Generator Implant Date: 20180430
Lead Channel Impedance Value: 917 Ohm
Lead Channel Pacing Threshold Amplitude: 0.7 V
Lead Channel Pacing Threshold Pulse Width: 0.4 ms
Lead Channel Setting Pacing Amplitude: 2.5 V
Lead Channel Setting Pacing Pulse Width: 0.4 ms
Lead Channel Setting Sensing Sensitivity: 0.5 mV
Pulse Gen Serial Number: 198978

## 2018-11-16 ENCOUNTER — Encounter: Payer: Self-pay | Admitting: Cardiology

## 2018-11-16 NOTE — Progress Notes (Signed)
Remote ICD transmission.   

## 2018-12-08 NOTE — Progress Notes (Signed)
Patient Care Team: Medicine, [place] as PCP - General (Family Medicine)   HPI  Donald Bond is a 58 y.o. male Seen in followup for HCM associated with syncope and previously treated ventricular tachycardia;  He has had 3 previous devices.  Underwent gen change 4  2018 w AEGIS pouch support.   He has persistent afib; on apixoban . He underwent cardioversion at the time of his device change   He has had problems with Afib and RVR with reaching into (VF zone > 240 bpm)  Atenolol was increased   Unfortunately he has continued to have episodes detected by his device > 210/240  Dofetilide initiated 4/19  Saw JA 6/19 for consideration of catheter ablation>> declined  Atrial fib recurrent  But scant w palp and DOE   The patient denies chest pain  nocturnal dyspnea, orthopnea or peripheral edema.  There have been no lightheadedness or syncope.    DATE TEST    4/18 Echo EF normal % LAE (49/2.4/70)               Date Cr  K Mg Hgb  9/18 0.95 4.3  15.5  4/19  0.89 4.3 2.1 16.6  9/19 0.99 4.2 2.2   2/20 0.91 4.5  45.3 (Hct)          Family >> Gene Screened Son Donald Bond + ( in school) Donald Bond - ( struggling not in school)   Past Medical History:  Diagnosis Date  . AICD (automatic cardioverter/defibrillator) present X 4  . Headache    "weekly" (06/15/2017)  . History of kidney stones   . Hypertr obst cardiomyop   . ICD--[employer] Judes    Dr. Caryl Comes  . Orthostatic lightheadedness   . SYNCOPE   . TIREDNESS   . Ventricular tachycardia ([place])    rx via ICD    Past Surgical History:  Procedure Laterality Date  . CARDIAC CATHETERIZATION    . CARDIAC DEFIBRILLATOR PLACEMENT  ~ 2000; ~ 2010   "I've had 4" (06/15/2017)  . CARDIOVERSION  06/29/2016   Procedure: Cardioversion;  Surgeon: Deboraha Sprang, MD;  Location: [facility] CV LAB;  Service: Cardiovascular;;  . CARDIOVERSION N/A 06/17/2017   Procedure: CARDIOVERSION;   Surgeon: Jerline Pain, MD;  Location: [facility] [place] [facility] ENDOSCOPY;  Service: Cardiovascular;  Laterality: N/A;  . Guidant Vitality ICD Impantation--Guidant Vitality T135--11/13/2003    . HYDROCELE EXCISION / REPAIR Left 04/2013  . ICD GENERATOR CHANGEOUT N/A 06/29/2016   Procedure: ICD Generator Changeout;  Surgeon: Deboraha Sprang, MD;  Location: [facility] CV LAB;  Service: Cardiovascular;  Laterality: N/A;  . LAPAROSCOPIC CHOLECYSTECTOMY  2003  . LITHOTRIPSY  X 1    Current Outpatient Medications  Medication Sig Dispense Refill  . apixaban (ELIQUIS) 5 MG TABS tablet Take 1 tablet (5 mg total) by mouth 2 (two) times daily. 60 tablet 11  . atenolol (TENORMIN) 25 MG tablet TAKE 3 TABLETS BY MOUTH DAILY 270 tablet 2  . dofetilide (TIKOSYN) 500 MCG capsule Take 1 capsule (500 mcg total) by mouth 2 (two) times daily. 60 capsule 5  . ibuprofen (ADVIL,MOTRIN) 200 MG tablet Take 800 mg by mouth every 4 (four) hours as needed for headache or moderate pain.     Marland Kitchen loratadine (CLARITIN) 10 MG tablet Take 10 mg by mouth daily as needed for allergies.     Marland Kitchen losartan (COZAAR) 25 MG tablet Take 1 tablet (25 mg total) by mouth daily. 30 tablet  11   No current facility-administered medications for this visit.     No Known Allergies  Review of Systems negative except from HPI and PMH  Physical Exam BP 112/78   Pulse 61   Ht 5\' 7"  (1.702 m)   Wt 186 lb 9.6 oz (84.6 kg)   BMI 29.23 kg/m  Well developed and well nourished in no acute distress HENT normal Neck supple with JVP-flat Clear Device pocket well healed; without hematoma or erythema.  There is no tethering  Regular rate and rhythm, no  gallop No  murmur Abd-soft with active BS No Clubbing cyanosis  edema Skin-warm and dry A & Oriented  Grossly normal sensory and motor function  ECG sinus @ 61 28/15/55 LBBB   Assessment and plan  Hypertrophic cardio myopathy    Ventricular tachycardia with appropriate therapy   LBBB//1AVB   Inappropriate  therapy  Atrial Fibrillation-persistent long-term  PVCs  Sleep disordered breathing and daytime somnolence  Status post ICD implantation-St. Jude  The patient's device was interrogated.  The information was reviewed. No changes were made in the programming.      BP well controlled  Scant AFib  Tolerating dofetilide  Check surveillance labs  Son will need echo  No intercurrent Ventricular tachycardia

## 2018-12-09 ENCOUNTER — Ambulatory Visit (INDEPENDENT_AMBULATORY_CARE_PROVIDER_SITE_OTHER): Payer: BC Managed Care – PPO | Admitting: Internal Medicine

## 2018-12-09 ENCOUNTER — Encounter: Payer: Self-pay | Admitting: Internal Medicine

## 2018-12-09 ENCOUNTER — Other Ambulatory Visit: Payer: Self-pay

## 2018-12-09 VITALS — BP 112/78 | HR 61 | Ht 67.0 in | Wt 186.6 lb

## 2018-12-09 DIAGNOSIS — Z95810 Presence of automatic (implantable) cardiac defibrillator: Secondary | ICD-10-CM | POA: Diagnosis not present

## 2018-12-09 DIAGNOSIS — G4730 Sleep apnea, unspecified: Secondary | ICD-10-CM

## 2018-12-09 DIAGNOSIS — I472 Ventricular tachycardia, unspecified: Secondary | ICD-10-CM

## 2018-12-09 DIAGNOSIS — I421 Obstructive hypertrophic cardiomyopathy: Secondary | ICD-10-CM | POA: Diagnosis not present

## 2018-12-09 DIAGNOSIS — I4891 Unspecified atrial fibrillation: Secondary | ICD-10-CM

## 2018-12-09 LAB — CUP PACEART INCLINIC DEVICE CHECK
Brady Statistic RV Percent Paced: 1 % — CL
Date Time Interrogation Session: 20201009040000
HighPow Impedance: 48 Ohm
Implantable Lead Implant Date: 20000815
Implantable Lead Location: 753860
Implantable Lead Model: 144
Implantable Lead Serial Number: 334759
Implantable Pulse Generator Implant Date: 20180430
Lead Channel Impedance Value: 855 Ohm
Lead Channel Pacing Threshold Amplitude: 0.7 V
Lead Channel Pacing Threshold Pulse Width: 0.4 ms
Lead Channel Sensing Intrinsic Amplitude: 25 mV
Lead Channel Setting Pacing Amplitude: 2.5 V
Lead Channel Setting Pacing Pulse Width: 0.4 ms
Lead Channel Setting Sensing Sensitivity: 0.5 mV
Pulse Gen Serial Number: 198978

## 2018-12-09 LAB — BASIC METABOLIC PANEL
BUN/Creatinine Ratio: 15 (ref 9–20)
BUN: 14 mg/dL (ref 6–24)
CO2: 23 mmol/L (ref 20–29)
Calcium: 9.5 mg/dL (ref 8.7–10.2)
Chloride: 104 mmol/L (ref 96–106)
Creatinine, Ser: 0.93 mg/dL (ref 0.76–1.27)
GFR calc Af Amer: 105 mL/min/{1.73_m2} (ref >59)
GFR calc non Af Amer: 91 mL/min/{1.73_m2} (ref >59)
Glucose: 79 mg/dL (ref 65–99)
Potassium: 4.5 mmol/L (ref 3.5–5.2)
Sodium: 140 mmol/L (ref 134–144)

## 2018-12-09 LAB — MAGNESIUM: Magnesium: 2.2 mg/dL (ref 1.6–2.3)

## 2018-12-09 MED ORDER — DOFETILIDE 500 MCG PO CAPS: 500.0000 ug | ORAL_CAPSULE | Freq: Two times a day (BID) | ORAL | 12 refills | Status: DC

## 2018-12-09 MED ORDER — ATENOLOL 25 MG PO TABS: 75.0000 mg | ORAL_TABLET | Freq: Every day | ORAL | 3 refills | Status: DC

## 2018-12-09 NOTE — Patient Instructions (Signed)
Medication Instructions:  Your physician recommends that you continue on your current medications as directed. Please refer to the Current Medication list given to you today.  Labwork: You will have labs drawn today: BMP,Mg  Testing/Procedures: None ordered.  Follow-Up: Your physician recommends that you schedule a follow-up appointment in:   6 mo with Dr. Caryl Comes  Any Other Special Instructions Will Be Listed Below (If Applicable).     If you need a refill on your cardiac medications before your next appointment, please call your pharmacy.

## 2018-12-27 DIAGNOSIS — H66001 Acute suppurative otitis media without spontaneous rupture of ear drum, right ear: Secondary | ICD-10-CM | POA: Diagnosis not present

## 2018-12-27 DIAGNOSIS — I10 Essential (primary) hypertension: Secondary | ICD-10-CM | POA: Diagnosis not present

## 2018-12-27 DIAGNOSIS — H6991 Unspecified Eustachian tube disorder, right ear: Secondary | ICD-10-CM | POA: Diagnosis not present

## 2019-02-03 ENCOUNTER — Ambulatory Visit (INDEPENDENT_AMBULATORY_CARE_PROVIDER_SITE_OTHER): Payer: BC Managed Care – PPO | Admitting: *Deleted

## 2019-02-03 DIAGNOSIS — I422 Other hypertrophic cardiomyopathy: Secondary | ICD-10-CM | POA: Diagnosis not present

## 2019-02-04 LAB — CUP PACEART REMOTE DEVICE CHECK
Battery Remaining Longevity: 144 mo
Battery Remaining Percentage: 100 %
Brady Statistic RV Percent Paced: 0 %
Date Time Interrogation Session: 20201204040100
HighPow Impedance: 53 Ohm
Implantable Lead Implant Date: 20000815
Implantable Lead Location: 753860
Implantable Lead Model: 144
Implantable Lead Serial Number: 334759
Implantable Pulse Generator Implant Date: 20180430
Lead Channel Impedance Value: 851 Ohm
Lead Channel Pacing Threshold Amplitude: 0.7 V
Lead Channel Pacing Threshold Pulse Width: 0.4 ms
Lead Channel Setting Pacing Amplitude: 2.5 V
Lead Channel Setting Pacing Pulse Width: 0.4 ms
Lead Channel Setting Sensing Sensitivity: 0.5 mV
Pulse Gen Serial Number: 198978

## 2019-02-14 DIAGNOSIS — Z7901 Long term (current) use of anticoagulants: Secondary | ICD-10-CM | POA: Diagnosis not present

## 2019-02-14 DIAGNOSIS — I422 Other hypertrophic cardiomyopathy: Secondary | ICD-10-CM | POA: Diagnosis not present

## 2019-02-14 DIAGNOSIS — E663 Overweight: Secondary | ICD-10-CM | POA: Diagnosis not present

## 2019-02-14 DIAGNOSIS — I10 Essential (primary) hypertension: Secondary | ICD-10-CM | POA: Diagnosis not present

## 2019-04-17 ENCOUNTER — Telehealth: Payer: Self-pay | Admitting: Internal Medicine

## 2019-04-17 NOTE — Telephone Encounter (Signed)
**Note De-Identified Donald Bond Obfuscation** I did a Tikosyn/Dofetilide PA through covermymeds. Key: [id number]

## 2019-04-17 NOTE — Telephone Encounter (Signed)
Donald Foot, LPN can you please address this matter concerning pt's Tikosyn, because pt has refills left at his pharmacy, but pt states that his insurance has to approve this medication. Please advise

## 2019-04-17 NOTE — Telephone Encounter (Signed)
I did the Dofetilide PA using the ins info we have on file for the pt through covermymeds. Key: [id number]  Just when I finished this PA the pt called back and stated that he has not given [place] his new ins card and provided me with info from his new [employer] card. ID: [id number] PCN:[id number] [id number] Phone#: [phone]  He is advised that I will do another Dofetilide PA using this new ins info.

## 2019-04-17 NOTE — Telephone Encounter (Signed)
**Note De-Identified Verlee Pope Obfuscation** The pt is advised that I s/w [facility] at [facility] this morning who advised me that he will need to contact his ins plan to see which pharmacy he should use as his Ins will not cover his T[facility]osyn through [facility].  I states that he is calling them now. I have advised him that I will attempt a Tikosyn PA just incase one is needed.  He verbalized understanding and thanked me for calling him with this  Info.

## 2019-04-17 NOTE — Telephone Encounter (Signed)
°*  STAT* If patient is at the pharmacy, call can be transferred to refill team.   1. Which medications need to be refilled? (please list name of each medication and dose if known) Tikosyn   2. Which pharmacy/location (including street and city if local pharmacy) is medication to be sent to?  [phone]  3. Do they need a 30 day or 90 day supply? 30 Also patient states insurance has to approved

## 2019-04-18 NOTE — Telephone Encounter (Signed)
**Note De-Identified Suzann Lazaro Obfuscation** Following message received from covermymeds: Adham Obenchain Key: [id number] - PA Case ID: [id number]  Outcome  Approved today  Your PA request has been approved.   Additional information will be provided in the approval communication.  DrugDofetilide capsules  FormCaremark Electronic PA Form (NCPDP)   I have notified [facility] and the pt. of this approval.

## 2019-05-05 ENCOUNTER — Ambulatory Visit (INDEPENDENT_AMBULATORY_CARE_PROVIDER_SITE_OTHER): Payer: No Typology Code available for payment source | Admitting: *Deleted

## 2019-05-05 DIAGNOSIS — I422 Other hypertrophic cardiomyopathy: Secondary | ICD-10-CM

## 2019-05-05 LAB — CUP PACEART REMOTE DEVICE CHECK
Battery Remaining Longevity: 144 mo
Battery Remaining Percentage: 100 %
Brady Statistic RV Percent Paced: 0 %
Date Time Interrogation Session: 20210305040200
HighPow Impedance: 48 Ohm
Implantable Lead Implant Date: 20000815
Implantable Lead Location: 753860
Implantable Lead Model: 144
Implantable Lead Serial Number: 334759
Implantable Pulse Generator Implant Date: 20180430
Lead Channel Impedance Value: 811 Ohm
Lead Channel Pacing Threshold Amplitude: 0.7 V
Lead Channel Pacing Threshold Pulse Width: 0.4 ms
Lead Channel Setting Pacing Amplitude: 2.5 V
Lead Channel Setting Pacing Pulse Width: 0.4 ms
Lead Channel Setting Sensing Sensitivity: 0.5 mV
Pulse Gen Serial Number: 198978

## 2019-05-06 NOTE — Progress Notes (Signed)
ICD remote 

## 2019-06-14 ENCOUNTER — Other Ambulatory Visit: Payer: Self-pay

## 2019-06-14 MED ORDER — APIXABAN 5 MG PO TABS: 5.0000 mg | ORAL_TABLET | Freq: Two times a day (BID) | ORAL | 6 refills | Status: DC

## 2019-06-14 NOTE — Telephone Encounter (Signed)
Eliquis 5mg  refill request received. Patient is 59 years old, weight-84.6kg, Crea-0.93 on 12/09/2018, Diagnosis-Afib, and last seen by Dr. 02/08/2019 on 12/09/2018. Dose is appropriate based on dosing criteria. Will send in refill to requested pharmacy.

## 2019-06-15 DIAGNOSIS — H6093 Unspecified otitis externa, bilateral: Secondary | ICD-10-CM | POA: Diagnosis not present

## 2019-06-15 DIAGNOSIS — H906 Mixed conductive and sensorineural hearing loss, bilateral: Secondary | ICD-10-CM | POA: Diagnosis not present

## 2019-06-15 DIAGNOSIS — H9210 Otorrhea, unspecified ear: Secondary | ICD-10-CM | POA: Diagnosis not present

## 2019-06-29 DIAGNOSIS — L299 Pruritus, unspecified: Secondary | ICD-10-CM | POA: Diagnosis not present

## 2019-06-29 DIAGNOSIS — H6093 Unspecified otitis externa, bilateral: Secondary | ICD-10-CM | POA: Diagnosis not present

## 2019-07-03 DIAGNOSIS — R002 Palpitations: Secondary | ICD-10-CM | POA: Insufficient documentation

## 2019-07-04 ENCOUNTER — Other Ambulatory Visit: Payer: Self-pay

## 2019-07-04 ENCOUNTER — Ambulatory Visit (INDEPENDENT_AMBULATORY_CARE_PROVIDER_SITE_OTHER): Payer: 59 | Admitting: Internal Medicine

## 2019-07-04 ENCOUNTER — Encounter: Payer: Self-pay | Admitting: Internal Medicine

## 2019-07-04 VITALS — BP 110/80 | HR 74 | Ht 67.0 in | Wt 192.0 lb

## 2019-07-04 DIAGNOSIS — I4819 Other persistent atrial fibrillation: Secondary | ICD-10-CM

## 2019-07-04 DIAGNOSIS — R002 Palpitations: Secondary | ICD-10-CM

## 2019-07-04 DIAGNOSIS — I422 Other hypertrophic cardiomyopathy: Secondary | ICD-10-CM

## 2019-07-04 DIAGNOSIS — I472 Ventricular tachycardia, unspecified: Secondary | ICD-10-CM

## 2019-07-04 DIAGNOSIS — Z95810 Presence of automatic (implantable) cardiac defibrillator: Secondary | ICD-10-CM

## 2019-07-04 DIAGNOSIS — Z01812 Encounter for preprocedural laboratory examination: Secondary | ICD-10-CM

## 2019-07-04 NOTE — Patient Instructions (Addendum)
Medication Instructions:  Your physician recommends that you continue on your current medications as directed. Please refer to the Current Medication list given to you today.  Labwork: CBC, BMET and Magnesium today  Testing/Procedures: Your physician has requested that you have a Cardioversion.  Electrical Cardioversion uses a jolt of electricity to your heart either through paddles or wired patches attached to your chest. This is a controlled, usually prescheduled, procedure. This procedure is done at the hospital and you are not awake during the procedure. You usually go home the day of the procedure. Please see the instruction sheet given to you today for more information.    Follow-Up: Your physician wants you to follow-up in: 1 month with Dr Graciela Husbands. You will receive a reminder letter in the mail two months in advance. If you don't receive a letter, please call our office to schedule the follow-up appointment.  Remote monitoring is used to monitor your Pacemaker of ICD from home. This monitoring reduces the number of office visits required to check your device to one time per year. It allows [place] to keep an eye on the functioning of your device to ensure it is working properly.   Any Other Special Instructions Will Be Listed Below (If Applicable).   Dear [name] are scheduled for a Cardioversion on 07/11/2019  with Dr. Rennis Golden.  Please arrive at the [facility] (Main Entrance A) at [facility]: [address] [place], [place] [address] at 10am  DIET: Nothing to eat or drink after midnight except a sip of water with medications (see medication instructions below)  Medication Instructions  Continue your anticoagulant:Eliquis  You will need to continue your anticoagulant after your procedure until you  are told by your  Provider that it is safe to stop   Labs: CBC, BMET and Magnsium today  Your Pre-procedure COVID-19 Testing will be done on 07/07/2019 at 1215pm at [facility] at [address], [place], [place] [address]. Once you arrive at the testing site, stay in the right hand lane, go under the building overhang not the tent. If you are tested under the tent your results may not be back before your procedure. Please be on time for your appointment.  After your swab you will be given a mask to wear and instructed to go home and quarantine/no visitors until after your procedure. If you test positive you will be notified and your procedure will be cancelled.       You must have a responsible person to drive you home and stay in the waiting area during your procedure. Failure to do so could result in cancellation.  Bring your insurance cards.  *Special Note: Every effort is made to have your procedure done on time. Occasionally there are emergencies that occur at the hospital that may cause delays. Please be patient if a delay does occur.    If you need a refill on your cardiac medications before your next appointment, please call your pharmacy.

## 2019-07-04 NOTE — Progress Notes (Signed)
Patient Care Team: Medicine, [facility] as PCP - General (Family Medicine)   HPI  Donald Bond is a 59 y.o. male Seen in followup for HCM associated with syncope and previously treated ventricular tachycardia;  He has had 3 previous devices.  Underwent gen change 4  2018 w AEGIS pouch support.   He has persistent afib; on apixoban . He underwent cardioversion at the time of his device change   He has had problems with Afib and RVR with reaching into (VF zone > 240 bpm)  Atenolol was increased   Unfortunately he has continued to have episodes detected by his device > 210/240  Dofetilide initiated 4/19  Saw JA 6/19 for consideration of catheter ablation>> declined  Recurrent atrial fibrillation.  He got himself an AliveCor monitor and atrial fibrillation has been documented for the last 3-4 months.  Over the last 6-8 weeks he has had progressive fatigue and exercise intolerance with some tachypalpitations.  He has missed no medications.  DATE TEST    4/18 Echo EF normal % LAE (49/2.4/70)               Date Cr  K Mg Hgb  9/18 0.95 4.3  15.5  4/19  0.89 4.3 2.1 16.6  9/19 0.99 4.2 2.2   2/20 0.91 4.5  45.3 (Hct)  10/20 0.93 4.5 2.2     Family >> Gene Screened Son [name] + ( in school) [name] - ( struggling not in school)   Past Medical History:  Diagnosis Date  . AICD (automatic cardioverter/defibrillator) present X 4  . Headache    "weekly" (06/15/2017)  . History of kidney stones   . Hypertr obst cardiomyop   . ICD--St Judes    Dr. Graciela Husbands  . Orthostatic lightheadedness   . SYNCOPE   . TIREDNESS   . Ventricular tachycardia (HCC)    rx via ICD    Past Surgical History:  Procedure Laterality Date  . CARDIAC CATHETERIZATION    . CARDIAC DEFIBRILLATOR PLACEMENT  ~ 2000; ~ 2010   "I've had 4" (06/15/2017)  . CARDIOVERSION  06/29/2016   Procedure: Cardioversion;  Surgeon: Duke Salvia, MD;  Location: [facility]  INVASIVE CV LAB;  Service: Cardiovascular;;  . CARDIOVERSION N/A 06/17/2017   Procedure: CARDIOVERSION;  Surgeon: Jake Bathe, MD;  Location: [facility] ENDOSCOPY;  Service: Cardiovascular;  Laterality: N/A;  . Guidant Vitality ICD Impantation--Guidant Vitality T135--11/13/2003    . HYDROCELE EXCISION / REPAIR Left 04/2013  . ICD GENERATOR CHANGEOUT N/A 06/29/2016   Procedure: ICD Generator Changeout;  Surgeon: Duke Salvia, MD;  Location: [facility] INVASIVE CV LAB;  Service: Cardiovascular;  Laterality: N/A;  . LAPAROSCOPIC CHOLECYSTECTOMY  2003  . LITHOTRIPSY  X 1    Current Outpatient Medications  Medication Sig Dispense Refill  . apixaban (ELIQUIS) 5 MG TABS tablet Take 1 tablet (5 mg total) by mouth 2 (two) times daily. 60 tablet 6  . atenolol (TENORMIN) 25 MG tablet Take 3 tablets (75 mg total) by mouth daily. 270 tablet 3  . dofetilide (TIKOSYN) 500 MCG capsule Take 1 capsule (500 mcg total) by mouth 2 (two) times daily. 60 capsule 12  . ibuprofen (ADVIL,MOTRIN) 200 MG tablet Take 800 mg by mouth every 4 (four) hours as needed for headache or moderate pain.     Marland Kitchen loratadine (CLARITIN) 10 MG tablet Take 10 mg by mouth daily as needed for allergies.     Marland Kitchen losartan (COZAAR) 25  MG tablet Take 1 tablet (25 mg total) by mouth daily. 30 tablet 11   No current facility-administered medications for this visit.    No Known Allergies  Review of Systems negative except from HPI and PMH  Physical Exam BP 110/80   Pulse 74   Ht 5\' 7"  (1.702 m)   Wt 192 lb (87.1 kg)   SpO2 95%   BMI 30.07 kg/m  Well developed and well nourished in no acute distress HENT normal Neck supple with JVP-flat Clear Device pocket well healed; without hematoma or erythema.  There is no tethering  Irregularly irregular rate and rhythm, 2/6 murmur Abd-soft with active BS No Clubbing cyanosis   edema Skin-warm and dry A & Oriented  Grossly normal sensory and motor function  ECG atrial fibrillation/flutter at  74 Intervals-/14/49   Assessment and plan  Hypertrophic cardio myopathy    Ventricular tachycardia with appropriate therapy   LBBB//1AVB   Inappropriate therapy  Atrial Fibrillation-persistent long-term-recurrent  PVCs  Sleep disordered breathing and daytime somnolence  Status post ICD implantation-[employer]     The patient has symptomatic recurrent atrial fibrillation/flutter.  Is been a couple years.  We will plan to undertake cardioversion.  He has missed no Eliquis.  We will plan thereafter to set him up with the A. fib clinic so as to promote greater continuity of care.  No interval ventricular tachycardia.  No volume overload.

## 2019-07-05 LAB — MAGNESIUM: Magnesium: 2.1 mg/dL (ref 1.6–2.3)

## 2019-07-05 LAB — CBC
Hematocrit: 46.7 % (ref 37.5–51.0)
Hemoglobin: 15.9 g/dL (ref 13.0–17.7)
MCH: 30.9 pg (ref 26.6–33.0)
MCHC: 34 g/dL (ref 31.5–35.7)
MCV: 91 fL (ref 79–97)
Platelets: 180 10*3/uL (ref 150–450)
RBC: 5.14 x10E6/uL (ref 4.14–5.80)
RDW: 12.6 % (ref 11.6–15.4)
WBC: 7.7 10*3/uL (ref 3.4–10.8)

## 2019-07-05 LAB — BASIC METABOLIC PANEL
BUN/Creatinine Ratio: 15 (ref 9–20)
BUN: 15 mg/dL (ref 6–24)
CO2: 21 mmol/L (ref 20–29)
Calcium: 9.3 mg/dL (ref 8.7–10.2)
Chloride: 106 mmol/L (ref 96–106)
Creatinine, Ser: 1.03 mg/dL (ref 0.76–1.27)
GFR calc Af Amer: 92 mL/min/{1.73_m2} (ref >59)
GFR calc non Af Amer: 80 mL/min/{1.73_m2} (ref >59)
Glucose: 98 mg/dL (ref 65–99)
Potassium: 4.4 mmol/L (ref 3.5–5.2)
Sodium: 143 mmol/L (ref 134–144)

## 2019-07-07 ENCOUNTER — Other Ambulatory Visit (HOSPITAL_COMMUNITY)
Admission: RE | Admit: 2019-07-07 | Discharge: 2019-07-07 | Disposition: A | Discharge status: Home / Self Care | Payer: BC Managed Care – PPO | Source: Ambulatory Visit | Attending: Internal Medicine | Admitting: Internal Medicine

## 2019-07-07 DIAGNOSIS — Z20822 Contact with and (suspected) exposure to covid-19: Secondary | ICD-10-CM | POA: Insufficient documentation

## 2019-07-07 DIAGNOSIS — Z01812 Encounter for preprocedural laboratory examination: Secondary | ICD-10-CM | POA: Insufficient documentation

## 2019-07-08 LAB — SARS CORONAVIRUS 2 (TAT 6-24 HRS): SARS Coronavirus 2: NEGATIVE

## 2019-07-11 ENCOUNTER — Encounter (HOSPITAL_COMMUNITY)
Admission: RE | Disposition: A | Discharge status: Home / Self Care | Payer: BC Managed Care – PPO | Source: Home / Self Care | Attending: Internal Medicine

## 2019-07-11 ENCOUNTER — Ambulatory Visit (HOSPITAL_COMMUNITY): Payer: BC Managed Care – PPO | Admitting: Registered Nurse

## 2019-07-11 ENCOUNTER — Encounter (HOSPITAL_COMMUNITY): Payer: Self-pay | Admitting: Internal Medicine

## 2019-07-11 ENCOUNTER — Other Ambulatory Visit: Payer: Self-pay

## 2019-07-11 ENCOUNTER — Ambulatory Visit (HOSPITAL_COMMUNITY)
Admission: RE | Admit: 2019-07-11 | Discharge: 2019-07-11 | Disposition: A | Discharge status: Home / Self Care | Payer: BC Managed Care – PPO | Attending: Internal Medicine | Admitting: Internal Medicine

## 2019-07-11 DIAGNOSIS — I447 Left bundle-branch block, unspecified: Secondary | ICD-10-CM | POA: Diagnosis not present

## 2019-07-11 DIAGNOSIS — I4819 Other persistent atrial fibrillation: Secondary | ICD-10-CM | POA: Diagnosis not present

## 2019-07-11 DIAGNOSIS — I421 Obstructive hypertrophic cardiomyopathy: Secondary | ICD-10-CM | POA: Diagnosis not present

## 2019-07-11 DIAGNOSIS — Z79899 Other long term (current) drug therapy: Secondary | ICD-10-CM | POA: Diagnosis not present

## 2019-07-11 DIAGNOSIS — Z7901 Long term (current) use of anticoagulants: Secondary | ICD-10-CM | POA: Diagnosis not present

## 2019-07-11 DIAGNOSIS — R002 Palpitations: Secondary | ICD-10-CM | POA: Insufficient documentation

## 2019-07-11 DIAGNOSIS — G4730 Sleep apnea, unspecified: Secondary | ICD-10-CM | POA: Insufficient documentation

## 2019-07-11 DIAGNOSIS — I480 Paroxysmal atrial fibrillation: Secondary | ICD-10-CM | POA: Diagnosis not present

## 2019-07-11 DIAGNOSIS — I472 Ventricular tachycardia: Secondary | ICD-10-CM | POA: Diagnosis not present

## 2019-07-11 DIAGNOSIS — Z95810 Presence of automatic (implantable) cardiac defibrillator: Secondary | ICD-10-CM | POA: Insufficient documentation

## 2019-07-11 DIAGNOSIS — I10 Essential (primary) hypertension: Secondary | ICD-10-CM | POA: Diagnosis not present

## 2019-07-11 HISTORY — PX: CARDIOVERSION: SHX1299

## 2019-07-11 SURGERY — CARDIOVERSION
Anesthesia: General

## 2019-07-11 MED ORDER — PROPOFOL 10 MG/ML IV BOLUS
INTRAVENOUS | Status: DC | PRN
  Administered 2019-07-11: 60 mg via INTRAVENOUS

## 2019-07-11 MED ORDER — LIDOCAINE 2% (20 MG/ML) 5 ML SYRINGE
INTRAMUSCULAR | Status: DC | PRN
  Administered 2019-07-11: 40 mg via INTRAVENOUS

## 2019-07-11 MED ORDER — SODIUM CHLORIDE 0.9 % IV SOLN: INTRAVENOUS | Status: DC | PRN

## 2019-07-11 NOTE — Transfer of Care (Signed)
Immediate Anesthesia Transfer of Care Note  Patient: Donald Bond  Procedure(s) Performed: CARDIOVERSION (N/A )  Patient Location: Endoscopy Unit  Anesthesia Type:General  Level of Consciousness: oriented, sedated and patient cooperative  Airway & Oxygen Therapy: Patient Spontanous Breathing and Patient connected to nasal cannula oxygen  Post-op Assessment: Report given to RN and Post -op Vital signs reviewed and stable  Post vital signs: Reviewed  Last Vitals:  Vitals Value Taken Time  BP    Temp    Pulse    Resp    SpO2      Last Pain:  Vitals:   07/11/19 1027  TempSrc: Tympanic  PainSc: 0-No pain         Complications: No apparent anesthesia complications

## 2019-07-11 NOTE — Anesthesia Preprocedure Evaluation (Addendum)
Anesthesia Evaluation  Patient identified by MRN, date of birth, ID band Patient awake    Reviewed: Allergy & Precautions, NPO status , Patient's Chart, lab work & pertinent test results  Airway Mallampati: II       Dental  (+) Teeth Intact, Dental Advisory Given   Pulmonary neg pulmonary ROS,    breath sounds clear to auscultation       Cardiovascular hypertension, Pt. on home beta blockers and Pt. on medications + dysrhythmias Atrial Fibrillation  Rhythm:Irregular Rate:Normal     Neuro/Psych  Headaches, negative psych ROS   GI/Hepatic negative GI ROS, Neg liver ROS,   Endo/Other  negative endocrine ROS  Renal/GU negative Renal ROS     Musculoskeletal negative musculoskeletal ROS (+)   Abdominal Normal abdominal exam  (+)   Peds  Hematology negative hematology ROS (+)   Anesthesia Other Findings   Reproductive/Obstetrics                            Anesthesia Physical Anesthesia Plan  ASA: III  Anesthesia Plan: General   Post-op Pain Management:    Induction: Intravenous  PONV Risk Score and Plan: 0  Airway Management Planned: Natural Airway and Simple Face Mask  Additional Equipment: None  Intra-op Plan:   Post-operative Plan:   Informed Consent: I have reviewed the patients History and Physical, chart, labs and discussed the procedure including the risks, benefits and alternatives for the proposed anesthesia with the patient or authorized representative who has indicated his/her understanding and acceptance.       Plan Discussed with: CRNA  Anesthesia Plan Comments:        Anesthesia Quick Evaluation

## 2019-07-11 NOTE — Anesthesia Procedure Notes (Signed)
Date/Time: 07/11/2019 10:48 AM Performed by: Laruth Bouchard., CRNA Pre-anesthesia Checklist: Patient identified, Emergency Drugs available, Suction available, Patient being monitored and Timeout performed Patient Re-evaluated:Patient Re-evaluated prior to induction Oxygen Delivery Method: Ambu bag Preoxygenation: Pre-oxygenation with 100% oxygen Induction Type: IV induction Placement Confirmation: positive ETCO2

## 2019-07-11 NOTE — Anesthesia Postprocedure Evaluation (Signed)
Anesthesia Post Note  Patient: Donald Bond  Procedure(s) Performed: CARDIOVERSION (N/A )     Patient location during evaluation: PACU Anesthesia Type: General Level of consciousness: awake and alert Pain management: pain level controlled Vital Signs Assessment: post-procedure vital signs reviewed and stable Respiratory status: spontaneous breathing, nonlabored ventilation, respiratory function stable and patient connected to nasal cannula oxygen Cardiovascular status: blood pressure returned to baseline and stable Postop Assessment: no apparent nausea or vomiting Anesthetic complications: no    Last Vitals:  Vitals:   07/11/19 1121 07/11/19 1128  BP: 115/77 115/84  Pulse: 71 72  Resp: (!) 24 (!) 28  Temp:    SpO2: 99% 96%    Last Pain:  Vitals:   07/11/19 1128  TempSrc:   PainSc: 0-No pain                 [name]

## 2019-07-11 NOTE — CV Procedure (Signed)
   CARDIOVERSION NOTE  Procedure: Electrical Cardioversion Indications:  Atrial Fibrillation  Procedure Details:  Consent: Risks of procedure as well as the alternatives and risks of each were explained to the (patient/caregiver).  Consent for procedure obtained.  Time Out: Verified patient identification, verified procedure, site/side was marked, verified correct patient position, special equipment/implants available, medications/allergies/relevent history reviewed, required imaging and test results available.  Performed  AICD interrogated by representative prior to the procedure.  Patient placed on cardiac monitor, pulse oximetry, supplemental oxygen as necessary.  Sedation given: propofol per anesthesia Pacer pads placed anterior and posterior chest.  Cardioverted 1 time(s).  Cardioverted at 150J biphasic.  Impression: Findings: Post procedure EKG shows: NSR Complications: None Patient did tolerate procedure well.  Plan: 1. Successful DCCV with a single 150J biphasic shock.  2. Follow-up with Dr. Graciela Husbands.  Time Spent Directly with the Patient:  30 minutes   Chrystie Nose, MD, [facility], FACP  [place]  [facility]  Medical Director of the Advanced Lipid Disorders &  Cardiovascular Risk Reduction Clinic Diplomate of the American Board of Clinical Lipidology Attending Cardiologist  Direct Dial: [phone]  Fax: [phone]  Website:  [email or web address] 07/11/2019, 11:13 AM

## 2019-07-11 NOTE — Discharge Instructions (Signed)
Electrical Cardioversion Electrical cardioversion is the delivery of a jolt of electricity to restore a normal rhythm to the heart. A rhythm that is too fast or is not regular keeps the heart from pumping well. In this procedure, sticky patches or metal paddles are placed on the chest to deliver electricity to the heart from a device. This procedure may be done in an emergency if:  There is low or no blood pressure as a result of the heart rhythm.  Normal rhythm must be restored as fast as possible to protect the brain and heart from further damage.  It may save a life. This may also be a scheduled procedure for irregular or fast heart rhythms that are not immediately life-threatening. Tell a health care provider about:  Any allergies you have.  All medicines you are taking, including vitamins, herbs, eye drops, creams, and over-the-counter medicines.  Any problems you or family members have had with anesthetic medicines.  Any blood disorders you have.  Any surgeries you have had.  Any medical conditions you have.  Whether you are pregnant or may be pregnant. What are the risks? Generally, this is a safe procedure. However, problems may occur, including:  Allergic reactions to medicines.  A blood clot that breaks free and travels to other parts of your body.  The possible return of an abnormal heart rhythm within hours or days after the procedure.  Your heart stopping (cardiac arrest). This is rare. What happens before the procedure? Medicines  Your health care provider may have you start taking: ? Blood-thinning medicines (anticoagulants) so your blood does not clot as easily. ? Medicines to help stabilize your heart rate and rhythm.  Ask your health care provider about: ? Changing or stopping your regular medicines. This is especially important if you are taking diabetes medicines or blood thinners. ? Taking medicines such as aspirin and ibuprofen. These medicines can  thin your blood. Do not take these medicines unless your health care provider tells you to take them. ? Taking over-the-counter medicines, vitamins, herbs, and supplements. General instructions  Follow instructions from your health care provider about eating or drinking restrictions.  Plan to have someone take you home from the hospital or clinic.  If you will be going home right after the procedure, plan to have someone with you for 24 hours.  Ask your health care provider what steps will be taken to help prevent infection. These may include washing your skin with a germ-killing soap. What happens during the procedure?   An IV will be inserted into one of your veins.  Sticky patches (electrodes) or metal paddles may be placed on your chest.  You will be given a medicine to help you relax (sedative).  An electrical shock will be delivered. The procedure may vary among health care providers and hospitals. What can I expect after the procedure?  Your blood pressure, heart rate, breathing rate, and blood oxygen level will be monitored until you leave the hospital or clinic.  Your heart rhythm will be watched to make sure it does not change.  You may have some redness on the skin where the shocks were given. Follow these instructions at home:  Do not drive for 24 hours if you were given a sedative during your procedure.  Take over-the-counter and prescription medicines only as told by your health care provider.  Ask your health care provider how to check your pulse. Check it often.  Rest for 48 hours after the procedure or   as told by your health care provider.  Avoid or limit your caffeine use as told by your health care provider.  Keep all follow-up visits as told by your health care provider. This is important. Contact a health care provider if:  You feel like your heart is beating too quickly or your pulse is not regular.  You have a serious muscle cramp that does not go  away. Get help right away if:  You have discomfort in your chest.  You are dizzy or you feel faint.  You have trouble breathing or you are short of breath.  Your speech is slurred.  You have trouble moving an arm or leg on one side of your body.  Your fingers or toes turn cold or blue. Summary  Electrical cardioversion is the delivery of a jolt of electricity to restore a normal rhythm to the heart.  This procedure may be done right away in an emergency or may be a scheduled procedure if the condition is not an emergency.  Generally, this is a safe procedure.  After the procedure, check your pulse often as told by your health care provider. This information is not intended to replace advice given to you by your health care provider. Make sure you discuss any questions you have with your health care provider. Document Revised: 09/19/2018 Document Reviewed: 09/19/2018 Elsevier Patient Education  2020 Elsevier Inc.  

## 2019-07-11 NOTE — H&P (Signed)
   INTERVAL PROCEDURE H&P  History and Physical Interval Note:  07/11/2019 10:10 AM  Donald Bond has presented today for their planned procedure. The various methods of treatment have been discussed with the patient and family. After consideration of risks, benefits and other options for treatment, the patient has consented to the procedure.  The patients' outpatient history has been reviewed, patient examined, and no change in status from most recent office note within the past 30 days. I have reviewed the patients' chart and labs and will proceed as planned. Questions were answered to the patient's satisfaction.   Chrystie Nose, MD, [facility], FACP  [place]  [facility]  Medical Director of the Advanced Lipid Disorders &  Cardiovascular Risk Reduction Clinic Diplomate of the American Board of Clinical Lipidology Attending Cardiologist  Direct Dial: [phone]  Fax: [phone]  Website:  [email or web address] 07/11/2019, 10:10 AM

## 2019-08-04 ENCOUNTER — Ambulatory Visit (INDEPENDENT_AMBULATORY_CARE_PROVIDER_SITE_OTHER): Payer: BC Managed Care – PPO | Admitting: *Deleted

## 2019-08-04 DIAGNOSIS — I422 Other hypertrophic cardiomyopathy: Secondary | ICD-10-CM | POA: Diagnosis not present

## 2019-08-04 LAB — CUP PACEART REMOTE DEVICE CHECK
Battery Remaining Longevity: 144 mo
Battery Remaining Percentage: 100 %
Brady Statistic RV Percent Paced: 0 %
Date Time Interrogation Session: 20210604040000
HighPow Impedance: 48 Ohm
Implantable Lead Implant Date: 20000815
Implantable Lead Location: 753860
Implantable Lead Model: 144
Implantable Lead Serial Number: 334759
Implantable Pulse Generator Implant Date: 20180430
Lead Channel Impedance Value: 765 Ohm
Lead Channel Pacing Threshold Amplitude: 0.9 V
Lead Channel Pacing Threshold Pulse Width: 0.4 ms
Lead Channel Setting Pacing Amplitude: 2.5 V
Lead Channel Setting Pacing Pulse Width: 0.4 ms
Lead Channel Setting Sensing Sensitivity: 0.5 mV
Pulse Gen Serial Number: 198978

## 2019-08-09 NOTE — Progress Notes (Signed)
Remote ICD transmission.   

## 2019-08-14 ENCOUNTER — Ambulatory Visit: Payer: BC Managed Care – PPO | Admitting: Student

## 2019-08-15 DIAGNOSIS — Z7901 Long term (current) use of anticoagulants: Secondary | ICD-10-CM | POA: Diagnosis not present

## 2019-08-15 DIAGNOSIS — I480 Paroxysmal atrial fibrillation: Secondary | ICD-10-CM | POA: Diagnosis not present

## 2019-08-15 DIAGNOSIS — I10 Essential (primary) hypertension: Secondary | ICD-10-CM | POA: Diagnosis not present

## 2019-08-15 DIAGNOSIS — I422 Other hypertrophic cardiomyopathy: Secondary | ICD-10-CM | POA: Diagnosis not present

## 2019-08-29 NOTE — Progress Notes (Addendum)
Electrophysiology Office Note Date: 08/30/2019  ID:  Donald Bond, DOB 1960/12/12, MRN [id number]  PCP: Medicine, [facility] Family Primary Cardiologist: No primary care provider on file. Electrophysiologist: Sherryl Manges, MD   CC: Routine ICD follow-up  Donald Bond is a 59 y.o. male seen today for Sherryl Manges, MD for post cardioversion follow up.  Since last being seen in our clinic the patient reports doing OK for the most part. On June 4th his mom had a stroke, and passed away later in the month. He has had a lot of stress dealing with this, and thinks this is for the most part why he hasn't gone back into normal rhythm. He feels like his DCCV lasted for about a week or so. He denies chest pain. He has mild fatigue but overall is tolerating being back in AF at this time. He has not had any ICD shocks.  Device History: Magazine features editor ICD implanted 2000 (Guidant ICD), Gen change 2005 (Guidant), St Jude gen change (2010) and most recently [employer] 2018 with Aegis pouch for Primary cardiomyopathy and h/o VT.    Past Medical History:  Diagnosis Date  . AICD (automatic cardioverter/defibrillator) present X 4  . Headache    "weekly" (06/15/2017)  . History of kidney stones   . Hypertr obst cardiomyop   . ICD--St Judes    Dr. Graciela Husbands  . Orthostatic lightheadedness   . SYNCOPE   . TIREDNESS   . Ventricular tachycardia (HCC)    rx via ICD   Past Surgical History:  Procedure Laterality Date  . CARDIAC CATHETERIZATION    . CARDIAC DEFIBRILLATOR PLACEMENT  ~ 2000; ~ 2010   "I've had 4" (06/15/2017)  . CARDIOVERSION  06/29/2016   Procedure: Cardioversion;  Surgeon: Duke Salvia, MD;  Location: [facility] INVASIVE CV LAB;  Service: Cardiovascular;;  . CARDIOVERSION N/A 06/17/2017   Procedure: CARDIOVERSION;  Surgeon: Jake Bathe, MD;  Location: [facility] ENDOSCOPY;  Service: Cardiovascular;  Laterality: N/A;  . CARDIOVERSION N/A 07/11/2019   Procedure:  CARDIOVERSION;  Surgeon: Chrystie Nose, MD;  Location: [facility] ENDOSCOPY;  Service: Cardiovascular;  Laterality: N/A;  . Guidant Vitality ICD Impantation--Guidant Vitality T135--11/13/2003    . HYDROCELE EXCISION / REPAIR Left 04/2013  . ICD GENERATOR CHANGEOUT N/A 06/29/2016   Procedure: ICD Generator Changeout;  Surgeon: Duke Salvia, MD;  Location: [facility] INVASIVE CV LAB;  Service: Cardiovascular;  Laterality: N/A;  . LAPAROSCOPIC CHOLECYSTECTOMY  2003  . LITHOTRIPSY  X 1    Current Outpatient Medications  Medication Sig Dispense Refill  . apixaban (ELIQUIS) 5 MG TABS tablet Take 1 tablet (5 mg total) by mouth 2 (two) times daily. 60 tablet 6  . atenolol (TENORMIN) 25 MG tablet Take 3 tablets (75 mg total) by mouth daily. 270 tablet 3  . dofetilide (TIKOSYN) 500 MCG capsule Take 1 capsule (500 mcg total) by mouth 2 (two) times daily. 60 capsule 12  . ibuprofen (ADVIL,MOTRIN) 200 MG tablet Take 600 mg by mouth every 4 (four) hours as needed for headache or moderate pain.     Marland Kitchen loratadine (CLARITIN) 10 MG tablet Take 10 mg by mouth daily as needed for allergies.     Marland Kitchen losartan (COZAAR) 25 MG tablet Take 1 tablet (25 mg total) by mouth daily. 30 tablet 11   No current facility-administered medications for this visit.    Allergies:   Patient has no known allergies.   Social History: Social History  Socioeconomic History  . Marital status: Married    Spouse name: Not on file  . Number of children: Not on file  . Years of education: Not on file  . Highest education level: Not on file  Occupational History  . Not on file  Tobacco Use  . Smoking status: Never Smoker  . Smokeless tobacco: Former Neurosurgeon    Types: Engineer, drilling  . Vaping Use: Never used  Substance and Sexual Activity  . Alcohol use: Yes    Alcohol/week: 3.0 standard drinks    Types: 3 Cans of beer per week  . Drug use: No  . Sexual activity: Yes  Other Topics Concern  . Not on file  Social History Narrative    Lives with spouse in [place]   employed   Social Determinants of Health   Financial Resource Strain:   . Difficulty of Paying Living Expenses:   Food Insecurity:   . Worried About Programme researcher, broadcasting/film/video in the Last Year:   . Barista in the Last Year:   Transportation Needs:   . Freight forwarder (Medical):   Marland Kitchen Lack of Transportation (Non-Medical):   Physical Activity:   . Days of Exercise per Week:   . Minutes of Exercise per Session:   Stress:   . Feeling of Stress :   Social Connections:   . Frequency of Communication with Friends and Family:   . Frequency of Social Gatherings with Friends and Family:   . Attends Religious Services:   . Active Member of Clubs or Organizations:   . Attends Banker Meetings:   Marland Kitchen Marital Status:   Intimate Partner Violence:   . Fear of Current or Ex-Partner:   . Emotionally Abused:   Marland Kitchen Physically Abused:   . Sexually Abused:     Family History: Family History  Problem Relation Age of Onset  . ALS Father   . Hypertension Other   . Heart attack Brother     Review of Systems: All other systems reviewed and are otherwise negative except as noted above.   Physical Exam: Vitals:   08/30/19 1058  BP: 114/80  Pulse: 88  SpO2: 97%  Weight: 187 lb 6.4 oz (85 kg)  Height: 5\' 7"  (1.702 m)     GEN- The patient is well appearing, alert and oriented x 3 today.   HEENT: normocephalic, atraumatic; sclera clear, conjunctiva pink; hearing intact; oropharynx clear; neck supple, no JVP Lymph- no cervical lymphadenopathy Lungs- Clear to ausculation bilaterally, normal work of breathing.  No wheezes, rales, rhonchi Heart- Regular rate and rhythm, no murmurs, rubs or gallops, PMI not laterally displaced GI- soft, non-tender, non-distended, bowel sounds present, no hepatosplenomegaly Extremities- no clubbing, cyanosis, or edema; DP/PT/radial pulses 2+ bilaterally MS- no significant deformity or atrophy Skin- warm and  dry, no rash or lesion; ICD pocket well healed Psych- euthymic mood, full affect Neuro- strength and sensation are intact  ICD interrogation- reviewed in detail today,  See PACEART report  EKG:  EKG is ordered today. Shows underlying AF with HR of 82 bpm. QT difficult due to fib waves. QT appears to be Closer ~430 or less when measured manually, which would be QTc of 480 when corrected for QRS width. Pt also has ICD. Will review with Dr. to make sure it is appropriate to continue current dosing.  ADDENDUM: Reviewed with Dr. Graciela Husbands and QTc stable when measured manually and using Fridericia for QT correction  Recent  Labs: 07/04/2019: BUN 15; Creatinine, Ser 1.03; Hemoglobin 15.9; Magnesium 2.1; Platelets 180; Potassium 4.4; Sodium 143   Wt Readings from Last 3 Encounters:  08/30/19 187 lb 6.4 oz (85 kg)  07/04/19 192 lb (87.1 kg)  12/09/18 186 lb 9.6 oz (84.6 kg)     Other studies Reviewed: Additional studies/ records that were reviewed today include: Previous EP notes   Assessment and Plan:  1.  HCM s/p [employer] single chamber ICD  euvolemic today Stable on an appropriate medical regimen Normal ICD function See Arita Miss Art report No changes today EF normal 2018 with severe atrial enlargement. If repeat DCCV unsuccessful, would be reasonable to repeat to help guide therapy considerations.   2. Atrial fibrillation with RVR S/p DCCV 07/11/2019 Continue tikosyn 500 mcg BID for now.  Continue atenolol 75 mg daily He appears to be back in fib today with controlled ventricular rates. Continue eliquis for CHA2DS2VASC of 1  (recent DCCV) Previously discussed ablation 07/2017, but thought low likelihood of success 2/2 atriopathy, pt preferred to continued medical therapy We discussed rate control and the possibility of stopping tikosyn. With all the stress from his Mother's passing, he would like to give tikosyn at least one more chance.  Will plan follow up with AF clinic in 3-4  weeks, and if remains in AF would plan DCCV. If unsuccessful then, would likely proceed with rate control as tolerated.  ? Amiodarone as an alternative, but he is fairly young for chronic use. As previously discussed, AV nodal ablation and CRT upgrade could also be potential option for him if his AF becomes more poorly tolerated.   3. LBBB Chronic. Stable.  Current medicines are reviewed at length with the patient today.   The patient does not have concerns regarding his medicines.  The following changes were made today:  none  Labs/ tests ordered today include:  Orders Placed This Encounter  Procedures  . EKG 12-Lead   Disposition:   Follow up with AF clinic in 1 month to consider cardioversion by patient preference.  Dustin Flock, PA-C  08/30/2019 12:07 PM  [facility] [address] [place] [address] [phone] (office) [phone] (fax)

## 2019-08-30 ENCOUNTER — Encounter: Payer: Self-pay | Admitting: Student

## 2019-08-30 ENCOUNTER — Other Ambulatory Visit: Payer: Self-pay

## 2019-08-30 ENCOUNTER — Ambulatory Visit (INDEPENDENT_AMBULATORY_CARE_PROVIDER_SITE_OTHER): Payer: No Typology Code available for payment source | Admitting: Student

## 2019-08-30 VITALS — BP 114/80 | HR 88 | Ht 67.0 in | Wt 187.4 lb

## 2019-08-30 DIAGNOSIS — I422 Other hypertrophic cardiomyopathy: Secondary | ICD-10-CM

## 2019-08-30 DIAGNOSIS — I4819 Other persistent atrial fibrillation: Secondary | ICD-10-CM

## 2019-08-30 DIAGNOSIS — Z95810 Presence of automatic (implantable) cardiac defibrillator: Secondary | ICD-10-CM

## 2019-08-30 LAB — CUP PACEART INCLINIC DEVICE CHECK
Date Time Interrogation Session: 20210630121447
HighPow Impedance: 48 Ohm
Implantable Lead Implant Date: 20000815
Implantable Lead Location: 753860
Implantable Lead Model: 144
Implantable Lead Serial Number: 334759
Implantable Pulse Generator Implant Date: 20180430
Lead Channel Impedance Value: 798 Ohm
Lead Channel Pacing Threshold Amplitude: 0.9 V
Lead Channel Pacing Threshold Pulse Width: 0.4 ms
Lead Channel Sensing Intrinsic Amplitude: 25 mV
Lead Channel Setting Pacing Amplitude: 2.5 V
Lead Channel Setting Pacing Pulse Width: 0.4 ms
Lead Channel Setting Sensing Sensitivity: 0.5 mV
Pulse Gen Serial Number: 198978

## 2019-08-30 NOTE — Patient Instructions (Addendum)
Medication Instructions:  *If you need a refill on your cardiac medications before your next appointment, please call your pharmacy*  Lab Work: If you have labs (blood work) drawn today and your tests are completely normal, you will receive your results only by: Marland Kitchen MyChart Message (if you have MyChart) OR . A paper copy in the mail If you have any lab test that is abnormal or we need to change your treatment, we will call you to review the results.  Testing/Procedures: None Ordered  Follow-Up: At [facility], you and your health needs are our priority.  As part of our continuing mission to provide you with exceptional heart care, we have created designated Provider Care Teams.  These Care Teams include your primary Cardiologist (physician) and Advanced Practice Providers (APPs -  Physician Assistants and Nurse Practitioners) who all work together to provide you with the care you need, when you need it.  We recommend signing up for the patient portal called "MyChart".  Sign up information is provided on this After Visit Summary.  MyChart is used to connect with patients for Virtual Visits (Telemedicine).  Patients are able to view lab/test results, encounter notes, upcoming appointments, etc.  Non-urgent messages can be sent to your provider as well.   To learn more about what you can do with MyChart, go to [email or web address].    Your next appointment:   Your physician wants you to follow-up in 1 MONTH with AFIB Clinic.  Remote monitoring is used to monitor your ICD from home. This monitoring reduces the number of office visits required to check your device to one time per year. It allows [place] to keep an eye on the functioning of your device to ensure it is working properly. You are scheduled for a device check from home on 11/03/19. You may send your transmission at any time that day. If you have a wireless device, the transmission will be sent automatically. After your physician reviews  your transmission, you will receive a postcard with your next transmission date.  The format for your next appointment:   In Person with AFIB Clinic

## 2019-09-19 ENCOUNTER — Telehealth: Payer: Self-pay

## 2019-09-19 NOTE — Telephone Encounter (Signed)
The pt called stating his monitor "doctor icon" was lit up yellow. I told the pt we may have to replace his monitor. He said that his monitor is green now. I told him if it happens again to call to let us know.

## 2019-09-25 ENCOUNTER — Telehealth: Payer: Self-pay | Admitting: Internal Medicine

## 2019-09-25 NOTE — Telephone Encounter (Signed)
Patient c/o Palpitations:  High priority if patient c/o lightheadedness, shortness of breath, or chest pain  1) How long have you had palpitations/irregular HR/ Afib? Are you having the symptoms now? A month; yes  2) Are you currently experiencing lightheadedness, SOB or CP? No  3) Do you have a history of afib (atrial fibrillation) or irregular heart rhythm? Yes  4) Have you checked your BP or HR? (document readings if available): No  5) Are you experiencing any other symptoms? Breath trying to catch up to heartbeat

## 2019-09-26 NOTE — Telephone Encounter (Signed)
Spoke with pt who states he remains in Afib for more than a month.  Pt denies current CP, SOB or dizziness.  Pt states he occasionally will be awakened from his sleep with some SOB but not severe.  Pt continues to take his Eliquis and Dofetilide as prescribed.  Pt has an appointment with Afib Clinic, Rudi Coco on 09/29/2019.  Pt advised to continue his medications as prescribed.  If he develops symptoms of CP, SOB, dizziness or severe fatigue to go to ED for further evaluation.  Pt verbalizes understanding and agrees with current plan.

## 2019-09-29 ENCOUNTER — Other Ambulatory Visit: Payer: Self-pay

## 2019-09-29 ENCOUNTER — Ambulatory Visit (HOSPITAL_COMMUNITY)
Admission: RE | Admit: 2019-09-29 | Discharge: 2019-09-29 | Disposition: A | Discharge status: Home / Self Care | Payer: BC Managed Care – PPO | Source: Ambulatory Visit | Attending: Nurse Practitioner | Admitting: Nurse Practitioner

## 2019-09-29 ENCOUNTER — Encounter (HOSPITAL_COMMUNITY): Payer: Self-pay | Admitting: Nurse Practitioner

## 2019-09-29 ENCOUNTER — Telehealth: Payer: Self-pay

## 2019-09-29 VITALS — BP 124/84 | HR 78 | Ht 67.0 in | Wt 190.2 lb

## 2019-09-29 DIAGNOSIS — I4891 Unspecified atrial fibrillation: Secondary | ICD-10-CM

## 2019-09-29 DIAGNOSIS — D6869 Other thrombophilia: Secondary | ICD-10-CM | POA: Diagnosis not present

## 2019-09-29 DIAGNOSIS — Z7901 Long term (current) use of anticoagulants: Secondary | ICD-10-CM | POA: Diagnosis not present

## 2019-09-29 DIAGNOSIS — Z95810 Presence of automatic (implantable) cardiac defibrillator: Secondary | ICD-10-CM | POA: Insufficient documentation

## 2019-09-29 DIAGNOSIS — I421 Obstructive hypertrophic cardiomyopathy: Secondary | ICD-10-CM | POA: Insufficient documentation

## 2019-09-29 DIAGNOSIS — I4819 Other persistent atrial fibrillation: Secondary | ICD-10-CM | POA: Insufficient documentation

## 2019-09-29 DIAGNOSIS — I472 Ventricular tachycardia: Secondary | ICD-10-CM | POA: Insufficient documentation

## 2019-09-29 DIAGNOSIS — Z79899 Other long term (current) drug therapy: Secondary | ICD-10-CM | POA: Insufficient documentation

## 2019-09-29 LAB — CBC
HCT: 50.8 % (ref 39.0–52.0)
Hemoglobin: 16.2 g/dL (ref 13.0–17.0)
MCH: 29.8 pg (ref 26.0–34.0)
MCHC: 31.9 g/dL (ref 30.0–36.0)
MCV: 93.4 fL (ref 80.0–100.0)
Platelets: 210 10*3/uL (ref 150–400)
RBC: 5.44 MIL/uL (ref 4.22–5.81)
RDW: 14.1 % (ref 11.5–15.5)
WBC: 7.8 10*3/uL (ref 4.0–10.5)
nRBC: 0 % (ref 0.0–0.2)

## 2019-09-29 LAB — BASIC METABOLIC PANEL
Anion gap: 8 (ref 5–15)
BUN: 12 mg/dL (ref 6–20)
CO2: 24 mmol/L (ref 22–32)
Calcium: 9.4 mg/dL (ref 8.9–10.3)
Chloride: 106 mmol/L (ref 98–111)
Creatinine, Ser: 1.19 mg/dL (ref 0.61–1.24)
GFR calc Af Amer: >60 mL/min (ref >60)
GFR calc non Af Amer: >60 mL/min (ref >60)
Glucose, Bld: 104 mg/dL — ABNORMAL HIGH (ref 70–99)
Potassium: 5.4 mmol/L — ABNORMAL HIGH (ref 3.5–5.1)
Sodium: 138 mmol/L (ref 135–145)

## 2019-09-29 LAB — MAGNESIUM: Magnesium: 2.3 mg/dL (ref 1.7–2.4)

## 2019-09-29 NOTE — Telephone Encounter (Signed)
[name] from the A-fib clinic called because Donald Bond wanted the pt to have a cardioversion. The pt EKG had a funky rhythm on it so she told him to send a remote transmission when he get home. She would like for someone to forward the Transmission to her when we receive it. I told her I will let the nurse know.

## 2019-09-29 NOTE — Progress Notes (Signed)
Primary Care Physician: Medicine, [facility] Family Referring Physician: Dr. Nicholes Rough is a 59 y.o. male with a h/o HOCM, VT,ICD, persistent afib  that is in the afib clinic for f/u from [facility]'s visit, 08/30/19, for persistent afib. He has lost both parents this summer and wanted to delay the cardioversion for  several weeks to deal  with the stress from  this.  Unfortunately, he remains in an atypical atrial flutter and I will proceed with cardioversion. His last cardioversion was May of this year. before that he had a nice stretch of  SR on Tikosyn. He has not had covid shots and states no missed does of eliquis. Being compliant with Tikosyn.   Today, he denies symptoms of  chest pain, shortness of breath, orthopnea, PND, lower extremity edema, dizziness, presyncope, syncope, or neurologic sequela. Noted a few palpitations. The patient is tolerating medications without difficulties and is otherwise without complaint today.   Past Medical History:  Diagnosis Date  . AICD (automatic cardioverter/defibrillator) present X 4  . Headache    "weekly" (06/15/2017)  . History of kidney stones   . Hypertr obst cardiomyop   . ICD--[facility]    Dr. Graciela Husbands  . Orthostatic lightheadedness   . SYNCOPE   . TIREDNESS   . Ventricular tachycardia (HCC)    rx via ICD   Past Surgical History:  Procedure Laterality Date  . CARDIAC CATHETERIZATION    . CARDIAC DEFIBRILLATOR PLACEMENT  ~ 2000; ~ 2010   "I've had 4" (06/15/2017)  . CARDIOVERSION  06/29/2016   Procedure: Cardioversion;  Surgeon: Duke Salvia, MD;  Location: [facility] INVASIVE CV LAB;  Service: Cardiovascular;;  . CARDIOVERSION N/A 06/17/2017   Procedure: CARDIOVERSION;  Surgeon: Jake Bathe, MD;  Location: [facility] ENDOSCOPY;  Service: Cardiovascular;  Laterality: N/A;  . CARDIOVERSION N/A 07/11/2019   Procedure: CARDIOVERSION;  Surgeon: Chrystie Nose, MD;  Location: [facility] ENDOSCOPY;  Service: Cardiovascular;   Laterality: N/A;  . Guidant Vitality ICD Impantation--Guidant Vitality T135--11/13/2003    . HYDROCELE EXCISION / REPAIR Left 04/2013  . ICD GENERATOR CHANGEOUT N/A 06/29/2016   Procedure: ICD Generator Changeout;  Surgeon: Duke Salvia, MD;  Location: [facility] INVASIVE CV LAB;  Service: Cardiovascular;  Laterality: N/A;  . LAPAROSCOPIC CHOLECYSTECTOMY  2003  . LITHOTRIPSY  X 1    Current Outpatient Medications  Medication Sig Dispense Refill  . apixaban (ELIQUIS) 5 MG TABS tablet Take 1 tablet (5 mg total) by mouth 2 (two) times daily. 60 tablet 6  . atenolol (TENORMIN) 25 MG tablet Take 3 tablets (75 mg total) by mouth daily. 270 tablet 3  . dofetilide (TIKOSYN) 500 MCG capsule Take 1 capsule (500 mcg total) by mouth 2 (two) times daily. 60 capsule 12  . ibuprofen (ADVIL,MOTRIN) 200 MG tablet Take 600 mg by mouth every 4 (four) hours as needed for headache or moderate pain.     Marland Kitchen loratadine (CLARITIN) 10 MG tablet Take 10 mg by mouth as needed for allergies.     Marland Kitchen losartan (COZAAR) 25 MG tablet Take 1 tablet (25 mg total) by mouth daily. 30 tablet 11   No current facility-administered medications for this encounter.    No Known Allergies  Social History   Socioeconomic History  . Marital status: Married    Spouse name: Not on file  . Number of children: Not on file  . Years of education: Not on file  . Highest education level: Not on  file  Occupational History  . Not on file  Tobacco Use  . Smoking status: Never Smoker  . Smokeless tobacco: Former Neurosurgeon    Types: Engineer, drilling  . Vaping Use: Never used  Substance and Sexual Activity  . Alcohol use: Yes    Alcohol/week: 3.0 standard drinks    Types: 3 Cans of beer per week  . Drug use: No  . Sexual activity: Yes  Other Topics Concern  . Not on file  Social History Narrative   Lives with spouse in [place]   employed   Social Determinants of Health   Financial Resource Strain:   . Difficulty of Paying Living  Expenses:   Food Insecurity:   . Worried About Programme researcher, broadcasting/film/video in the Last Year:   . Barista in the Last Year:   Transportation Needs:   . Freight forwarder (Medical):   Marland Kitchen Lack of Transportation (Non-Medical):   Physical Activity:   . Days of Exercise per Week:   . Minutes of Exercise per Session:   Stress:   . Feeling of Stress :   Social Connections:   . Frequency of Communication with Friends and Family:   . Frequency of Social Gatherings with Friends and Family:   . Attends Religious Services:   . Active Member of Clubs or Organizations:   . Attends Banker Meetings:   Marland Kitchen Marital Status:   Intimate Partner Violence:   . Fear of Current or Ex-Partner:   . Emotionally Abused:   Marland Kitchen Physically Abused:   . Sexually Abused:     Family History  Problem Relation Age of Onset  . ALS Father   . Hypertension Other   . Heart attack Brother     ROS- All systems are reviewed and negative except as per the HPI above  Physical Exam: Vitals:   09/29/19 1134  BP: 124/84  Pulse: 78  Weight: 86.3 kg  Height: 5\' 7"  (1.702 m)   Wt Readings from Last 3 Encounters:  09/29/19 86.3 kg  08/30/19 85 kg  07/04/19 87.1 kg    Labs: Lab Results  Component Value Date   NA 143 07/04/2019   K 4.4 07/04/2019   CL 106 07/04/2019   CO2 21 07/04/2019   GLUCOSE 98 07/04/2019   BUN 15 07/04/2019   CREATININE 1.03 07/04/2019   CALCIUM 9.3 07/04/2019   MG 2.1 07/04/2019   Lab Results  Component Value Date   INR 1.0 RATIO 03/20/2008   No results found for: CHOL, HDL, LDLCALC, TRIG   GEN- The patient is well appearing, alert and oriented x 3 today.   Head- normocephalic, atraumatic Eyes-  Sclera clear, conjunctiva pink Ears- hearing intact Oropharynx- clear Neck- supple, no JVP Lymph- no cervical lymphadenopathy Lungs- Clear to ausculation bilaterally, normal work of breathing Heart- irregular rate and rhythm, no murmurs, rubs or gallops, PMI not  laterally displaced GI- soft, NT, ND, + BS Extremities- no clubbing, cyanosis, or edema MS- no significant deformity or atrophy Skin- no rash or lesion Psych- euthymic mood, full affect Neuro- strength and sensation are intact  EKG - LBBB with first degree vrs atypical atrial flutter   Epic reviewed   Assessment and Plan: 1. Persistent afib Pt feels that he is out of rhythm but I am not quite if it is BBB with a long pr vrs an atypical flutter qtc is also prolonged  I asked pt to send a report on his  return home but in the interium, I discussed with Dr. Graciela Husbands who reviewed the ekg and feels it represents SR with a first degree av block with P wave in  the T wave. He feels the qt is not as long as reported out and pt does have an ICD.  I did set up cardioversion but this can be cancelled with Dr. Graciela Husbands confirming SR.   He will continue with tikosyn 500 mcg bid  No  missed eliquis 5 mg bid  Chadsvasc score of 2  Bmet/mag/cbc   2. HOCM/VT ICD in place Continue atenolol   F/u with Dr. Konrad Penta [facility] [address] [place], [place] [address] [phone]

## 2019-09-29 NOTE — Patient Instructions (Signed)
Cardioversion scheduled for Thursday August 5  - Arrive at the [facility] and go to admitting at 10:30am  -Do not eat or drink anything after midnight the night prior to your procedure.  - Take all your morning medication with a sip of water prior to arrival.  - You will not be able to drive home after your procedure.

## 2019-10-02 ENCOUNTER — Other Ambulatory Visit (HOSPITAL_COMMUNITY)
Admission: RE | Admit: 2019-10-02 | Discharge: 2019-10-02 | Disposition: A | Discharge status: Home / Self Care | Payer: BC Managed Care – PPO | Source: Ambulatory Visit | Attending: Cardiology | Admitting: Cardiology

## 2019-10-02 ENCOUNTER — Other Ambulatory Visit: Payer: Self-pay | Admitting: *Deleted

## 2019-10-02 DIAGNOSIS — Z20822 Contact with and (suspected) exposure to covid-19: Secondary | ICD-10-CM | POA: Insufficient documentation

## 2019-10-02 DIAGNOSIS — Z01812 Encounter for preprocedural laboratory examination: Secondary | ICD-10-CM | POA: Diagnosis not present

## 2019-10-02 LAB — SARS CORONAVIRUS 2 (TAT 6-24 HRS): SARS Coronavirus 2: NEGATIVE

## 2019-10-02 MED ORDER — LOSARTAN POTASSIUM 25 MG PO TABS: 25.0000 mg | ORAL_TABLET | Freq: Every day | ORAL | 8 refills | Status: DC

## 2019-10-02 NOTE — Telephone Encounter (Signed)
Transmission on Friday night- Unable to confirm AF due to RV lead only.  V rates irregular, mostly controlled.

## 2019-10-05 ENCOUNTER — Ambulatory Visit (HOSPITAL_COMMUNITY): Admission: RE | Admit: 2019-10-05 | Payer: BC Managed Care – PPO | Source: Home / Self Care | Admitting: Cardiology

## 2019-10-05 ENCOUNTER — Encounter (HOSPITAL_COMMUNITY): Admission: RE | Payer: Self-pay | Source: Home / Self Care

## 2019-10-05 SURGERY — CARDIOVERSION
Anesthesia: Monitor Anesthesia Care

## 2019-10-19 ENCOUNTER — Telehealth: Payer: Self-pay | Admitting: Internal Medicine

## 2019-10-19 NOTE — Telephone Encounter (Signed)
Forms from [facility] received on 10/19/19. Completed patient auth attached. Took form to Dr. Odessa Fleming box for completion. 10/19/19 vlm

## 2019-11-03 ENCOUNTER — Ambulatory Visit (INDEPENDENT_AMBULATORY_CARE_PROVIDER_SITE_OTHER): Payer: BC Managed Care – PPO | Admitting: *Deleted

## 2019-11-03 DIAGNOSIS — I4901 Ventricular fibrillation: Secondary | ICD-10-CM

## 2019-11-05 LAB — CUP PACEART REMOTE DEVICE CHECK
Battery Remaining Longevity: 144 mo
Battery Remaining Percentage: 100 %
Brady Statistic RV Percent Paced: 0 %
Date Time Interrogation Session: 20210903040100
HighPow Impedance: 47 Ohm
Implantable Lead Implant Date: 20000815
Implantable Lead Location: 753860
Implantable Lead Model: 144
Implantable Lead Serial Number: 334759
Implantable Pulse Generator Implant Date: 20180430
Lead Channel Impedance Value: 729 Ohm
Lead Channel Pacing Threshold Amplitude: 0.9 V
Lead Channel Pacing Threshold Pulse Width: 0.4 ms
Lead Channel Setting Pacing Amplitude: 2.5 V
Lead Channel Setting Pacing Pulse Width: 0.4 ms
Lead Channel Setting Sensing Sensitivity: 0.5 mV
Pulse Gen Serial Number: 198978

## 2019-11-07 ENCOUNTER — Telehealth: Payer: Self-pay | Admitting: Internal Medicine

## 2019-11-07 NOTE — Telephone Encounter (Signed)
Spoke with pt and advised per Dr Graciela Husbands he will discuss FMLA paperwork for pt's wife at this appointment scheduled for 11/13/2019.  Pt verbalizes understanding and agrees with current plan.

## 2019-11-07 NOTE — Telephone Encounter (Signed)
    Pt said he received a call from Dr. Odessa Fleming nurse last Thursday about the [facility] paperwork Dr. Odessa Fleming received, he said they need it today since his wife need it to submit it today at her work. He would like to speak with RN Mindi Junker

## 2019-11-07 NOTE — Telephone Encounter (Signed)
Attempted phone call to pt and left voicemail message to contact RN at [phone]. 

## 2019-11-07 NOTE — Progress Notes (Signed)
Remote ICD transmission.   

## 2019-11-13 ENCOUNTER — Encounter: Payer: Self-pay | Admitting: Internal Medicine

## 2019-11-13 ENCOUNTER — Other Ambulatory Visit: Payer: Self-pay

## 2019-11-13 ENCOUNTER — Ambulatory Visit (INDEPENDENT_AMBULATORY_CARE_PROVIDER_SITE_OTHER): Payer: BC Managed Care – PPO | Admitting: Internal Medicine

## 2019-11-13 VITALS — BP 126/86 | HR 96 | Ht 67.0 in | Wt 186.4 lb

## 2019-11-13 DIAGNOSIS — I472 Ventricular tachycardia, unspecified: Secondary | ICD-10-CM

## 2019-11-13 DIAGNOSIS — Z95810 Presence of automatic (implantable) cardiac defibrillator: Secondary | ICD-10-CM | POA: Diagnosis not present

## 2019-11-13 DIAGNOSIS — I4891 Unspecified atrial fibrillation: Secondary | ICD-10-CM

## 2019-11-13 DIAGNOSIS — I422 Other hypertrophic cardiomyopathy: Secondary | ICD-10-CM

## 2019-11-13 DIAGNOSIS — G4730 Sleep apnea, unspecified: Secondary | ICD-10-CM

## 2019-11-13 DIAGNOSIS — I421 Obstructive hypertrophic cardiomyopathy: Secondary | ICD-10-CM

## 2019-11-13 DIAGNOSIS — I4819 Other persistent atrial fibrillation: Secondary | ICD-10-CM | POA: Diagnosis not present

## 2019-11-13 DIAGNOSIS — I4901 Ventricular fibrillation: Secondary | ICD-10-CM | POA: Diagnosis not present

## 2019-11-13 MED ORDER — DOFETILIDE 500 MCG PO CAPS: 500.0000 ug | ORAL_CAPSULE | Freq: Two times a day (BID) | ORAL | 12 refills | Status: DC

## 2019-11-13 MED ORDER — RANOLAZINE ER 500 MG PO TB12
500.0000 mg | ORAL_TABLET | Freq: Two times a day (BID) | ORAL | 3 refills | Status: DC
Start: 2019-11-13 — End: 2019-11-26

## 2019-11-13 MED ORDER — ATENOLOL 100 MG PO TABS
100.0000 mg | ORAL_TABLET | Freq: Every day | ORAL | 3 refills | Status: DC
Start: 2019-11-13 — End: 2019-11-26

## 2019-11-13 MED ORDER — APIXABAN 5 MG PO TABS: 5.0000 mg | ORAL_TABLET | Freq: Two times a day (BID) | ORAL | 6 refills | Status: DC

## 2019-11-13 NOTE — Progress Notes (Signed)
Patient Care Team: Medicine, [facility] as PCP - General (Family Medicine) Donald Salvia, MD as PCP - Electrophysiology (Cardiology)   HPI  Donald Bond is a 59 y.o. male Seen in followup for HCM associated with syncope and previously treated ventricular tachycardia;  He has had 3 previous devices.  Underwent gen change 4  2018 w AEGIS pouch support.   He has persistent afib; on apixoban . He underwent cardioversion at the time of his device change   He has had problems with Afib and RVR with reaching into (VF zone > 240 bpm)  Atenolol was increased   Unfortunately he has continued to have episodes detected by his device > 210/240  Dofetilide initiated 4/19  Saw Donald Bond 6/19 for consideration of catheter ablation>> declined  Recurrent atrial fibrillation.  He got himself an [employer] monitor and persistent atrial fibrillation documented 5/21 and he underwent cardioversion delayed following the death of his parents   Exercise intolerance and fatigue associated with persistent atrial fibrillation. Modest palpitations. No edema. Some shortness of breath. No chest pain.      DATE TEST    4/18 Echo EF normal % LAE (49/2.4/70)               Date Cr  K Mg Hgb  9/18 0.95 4.3  15.5  4/19  0.89 4.3 2.1 16.6  9/19 0.99 4.2 2.2   2/20 0.91 4.5  45.3 (Hct)  10/20 0.93 4.5 2.2   7/21 1.19 5.4 2.3 16.2     Family >> Gene Screened Son Donald Bond + ( in school) Donald Bond - ( struggling not in school)   Past Medical History:  Diagnosis Date  . AICD (automatic cardioverter/defibrillator) present X 4  . Headache    "weekly" (06/15/2017)  . History of kidney stones   . Hypertr obst cardiomyop   . ICD--[facility]    Dr. Graciela Bond  . Orthostatic lightheadedness   . SYNCOPE   . TIREDNESS   . Ventricular tachycardia (HCC)    rx via ICD    Past Surgical History:  Procedure Laterality Date  . CARDIAC CATHETERIZATION    . CARDIAC  DEFIBRILLATOR PLACEMENT  ~ 2000; ~ 2010   "I've had 4" (06/15/2017)  . CARDIOVERSION  06/29/2016   Procedure: Cardioversion;  Surgeon: Donald Salvia, MD;  Location: [facility] INVASIVE CV LAB;  Service: Cardiovascular;;  . CARDIOVERSION N/A 06/17/2017   Procedure: CARDIOVERSION;  Surgeon: Donald Bathe, MD;  Location: [facility] ENDOSCOPY;  Service: Cardiovascular;  Laterality: N/A;  . CARDIOVERSION N/A 07/11/2019   Procedure: CARDIOVERSION;  Surgeon: Donald Nose, MD;  Location: [facility] ENDOSCOPY;  Service: Cardiovascular;  Laterality: N/A;  . Guidant Vitality ICD Impantation--Guidant Vitality T135--11/13/2003    . HYDROCELE EXCISION / REPAIR Left 04/2013  . ICD GENERATOR CHANGEOUT N/A 06/29/2016   Procedure: ICD Generator Changeout;  Surgeon: Donald Salvia, MD;  Location: [facility] INVASIVE CV LAB;  Service: Cardiovascular;  Laterality: N/A;  . LAPAROSCOPIC CHOLECYSTECTOMY  2003  . LITHOTRIPSY  X 1    Current Outpatient Medications  Medication Sig Dispense Refill  . apixaban (ELIQUIS) 5 MG TABS tablet Take 1 tablet (5 mg total) by mouth 2 (two) times daily. 60 tablet 6  . dofetilide (TIKOSYN) 500 MCG capsule Take 1 capsule (500 mcg total) by mouth 2 (two) times daily. 60 capsule 12  . ibuprofen (ADVIL,MOTRIN) 200 MG tablet Take 800 mg by mouth every 4 (four) hours as needed for headache  or moderate pain.     Marland Kitchen loratadine (CLARITIN) 10 MG tablet Take 10 mg by mouth as needed for allergies.     Marland Kitchen atenolol (TENORMIN) 100 MG tablet Take 1 tablet (100 mg total) by mouth daily. 90 tablet 3  . ranolazine (RANEXA) 500 MG 12 hr tablet Take 1 tablet (500 mg total) by mouth 2 (two) times daily. 180 tablet 3   No current facility-administered medications for this visit.    No Known Allergies  Review of Systems negative except from HPI and PMH  Physical Exam BP 126/86   Pulse 96   Ht 5\' 7"  (1.702 m)   Wt 186 lb 6.4 oz (84.6 kg)   SpO2 95%   BMI 29.19 kg/m  Well developed and well nourished in no acute  distress HENT normal Neck supple with JVP-flat Clear Device pocket well healed; without hematoma or erythema.  There is no tethering  Regular rate and rhythm, no  murmur Abd-soft with active BS No Clubbing cyanosis  edema Skin-warm and dry A & Oriented  Grossly normal sensory and motor function  ECG atrial fibrillation -coarse ventricular rate at 96  Assessment and plan  Hypertrophic cardio myopathy    Ventricular tachycardia with appropriate therapy   LBBB//1AVB   Inappropriate therapy  Atrial Fibrillation-persistent long-term-recurrent  PVCs  Sleep disordered breathing and daytime somnolence  Status post ICD implantation-[employer]  Grief   The patient has had recurrence and persistence of his atrial arrhythmia. Dofetilide is insufficient by itself. We will add ranolazine and anticipate repeat cardioversion.  Thereafter we will get an echocardiogram to look at left atrial size and this in the interval between recurrent events would inform further treatment options. He would include possibly revisiting with Dr. ; Donald Bond or consideration for hybrid ablation procedure. I would favor all of these prior to AV junction ablation.  Hyperkalemia--we will stop his losartan. This will also afford Donald Bond the opportunity to increase his atenolol for augmented rate control is about 40% of his beats are faster than 100 bpm. In the context of his HCM this is quite deleterious. Further up titration may be possible. We could also use augmented calcium blockers. Discussed potential fatigue related to his atenolol.  As well what a terrible story with his mom and dad both died within 20 days of each other. Struggling. He is also lost his brother.

## 2019-11-13 NOTE — Patient Instructions (Addendum)
Medication Instructions:   **  Begin taking Atenolol 100mg  1- tablet by mouth daily  ** Begin taking Ranexa 500mg  - 1 tablet by mouth twice daily  ** Stop Losartan  *If you need a refill on your cardiac medications before your next appointment, please call your pharmacy*   Lab Work: None ordered  If you have labs (blood work) drawn today and your tests are completely normal, you will receive your results only by: MyChart Message (if you have MyChart) OR . A paper copy in the mail If you have any lab test that is abnormal or we need to change your treatment, we will call you to review the results.   Testing/Procedures:  Your physician has requested that you have an echocardiogram. Echocardiography is a painless test that uses sound waves to create images of your heart. It provides your doctor with information about the size and shape of your heart and how well your heart's chambers and valves are working. This procedure takes approximately one hour. There are no restrictions for this procedure.   Your physician has recommended that you have a Cardioversion (DCCV). Electrical Cardioversion uses a jolt of electricity to your heart either through paddles or wired patches attached to your chest. This is a controlled, usually prescheduled, procedure. Defibrillation is done under light anesthesia in the hospital, and you usually go home the day of the procedure. This is done to get your heart back into a normal rhythm. You are not awake for the procedure. Please see the instruction sheet given to you today.   Follow-Up: At [facility], you and your health needs are our priority.  As part of our continuing mission to provide you with exceptional heart care, we have created designated Provider Care Teams.  These Care Teams include your primary Cardiologist (physician) and Advanced Practice Providers (APPs -  Physician Assistants and Nurse Practitioners) who all work together to provide you  with the care you need, when you need it.  We recommend signing up for the patient portal called "MyChart".  Sign up information is provided on this After Visit Summary.  MyChart is used to connect with patients for Virtual Visits (Telemedicine).  Patients are able to view lab/test results, encounter notes, upcoming appointments, etc.  Non-urgent messages can be sent to your provider as well.   To learn more about what you can do with MyChart, go to Marland Kitchen.    Your next appointment:   To be scheduled

## 2019-11-14 ENCOUNTER — Telehealth: Payer: Self-pay | Admitting: Internal Medicine

## 2019-11-14 DIAGNOSIS — Z01812 Encounter for preprocedural laboratory examination: Secondary | ICD-10-CM

## 2019-11-14 DIAGNOSIS — I480 Paroxysmal atrial fibrillation: Secondary | ICD-10-CM

## 2019-11-14 LAB — CUP PACEART INCLINIC DEVICE CHECK
Date Time Interrogation Session: 20210913172653
Implantable Lead Implant Date: 20000815
Implantable Lead Location: 753860
Implantable Lead Model: 144
Implantable Lead Serial Number: 334759
Implantable Pulse Generator Implant Date: 20180430
Pulse Gen Serial Number: 198978

## 2019-11-14 NOTE — Telephone Encounter (Signed)
Attempted phone call to pt to discuss DCCV instruction letter.  OK per chart to leave detailed voicemail message.  Pt advised letter has been sent to his MyChart.  DCCV scheduled for 11/22/2019 with labs and covid testing scheduled 11/20/2019.  Please see letter for complete details and contact RN at [phone] if needed.

## 2019-11-14 NOTE — Telephone Encounter (Signed)
New Message   Patient is returning in call in reference to cardioversion

## 2019-11-14 NOTE — Telephone Encounter (Signed)
Attempted phone call to pt to discuss instructions for upcoming DCCV scheduled for 11/22/2019.  Left voicemail for pt to contact RN at [phone].

## 2019-11-20 ENCOUNTER — Other Ambulatory Visit (HOSPITAL_COMMUNITY)
Admission: RE | Admit: 2019-11-20 | Discharge: 2019-11-20 | Disposition: A | Discharge status: Home / Self Care | Payer: BC Managed Care – PPO | Source: Ambulatory Visit | Attending: Cardiovascular Disease | Admitting: Cardiovascular Disease

## 2019-11-20 ENCOUNTER — Other Ambulatory Visit: Payer: Self-pay

## 2019-11-20 ENCOUNTER — Other Ambulatory Visit: Payer: BC Managed Care – PPO | Admitting: *Deleted

## 2019-11-20 DIAGNOSIS — Z9049 Acquired absence of other specified parts of digestive tract: Secondary | ICD-10-CM | POA: Diagnosis not present

## 2019-11-20 DIAGNOSIS — I472 Ventricular tachycardia: Secondary | ICD-10-CM | POA: Diagnosis not present

## 2019-11-20 DIAGNOSIS — I10 Essential (primary) hypertension: Secondary | ICD-10-CM | POA: Diagnosis not present

## 2019-11-20 DIAGNOSIS — I517 Cardiomegaly: Secondary | ICD-10-CM | POA: Diagnosis not present

## 2019-11-20 DIAGNOSIS — I480 Paroxysmal atrial fibrillation: Secondary | ICD-10-CM

## 2019-11-20 DIAGNOSIS — R001 Bradycardia, unspecified: Secondary | ICD-10-CM | POA: Diagnosis not present

## 2019-11-20 DIAGNOSIS — Z01812 Encounter for preprocedural laboratory examination: Secondary | ICD-10-CM

## 2019-11-20 DIAGNOSIS — I4891 Unspecified atrial fibrillation: Secondary | ICD-10-CM | POA: Diagnosis not present

## 2019-11-20 DIAGNOSIS — T797XXA Traumatic subcutaneous emphysema, initial encounter: Secondary | ICD-10-CM | POA: Diagnosis not present

## 2019-11-20 DIAGNOSIS — I4819 Other persistent atrial fibrillation: Secondary | ICD-10-CM | POA: Diagnosis not present

## 2019-11-20 DIAGNOSIS — S0990XA Unspecified injury of head, initial encounter: Secondary | ICD-10-CM | POA: Diagnosis not present

## 2019-11-20 DIAGNOSIS — Z20822 Contact with and (suspected) exposure to covid-19: Secondary | ICD-10-CM | POA: Diagnosis not present

## 2019-11-20 DIAGNOSIS — Z7901 Long term (current) use of anticoagulants: Secondary | ICD-10-CM | POA: Diagnosis not present

## 2019-11-20 DIAGNOSIS — R778 Other specified abnormalities of plasma proteins: Secondary | ICD-10-CM | POA: Diagnosis not present

## 2019-11-20 DIAGNOSIS — I4901 Ventricular fibrillation: Secondary | ICD-10-CM | POA: Diagnosis not present

## 2019-11-20 DIAGNOSIS — Z79899 Other long term (current) drug therapy: Secondary | ICD-10-CM | POA: Diagnosis not present

## 2019-11-20 DIAGNOSIS — S0003XA Contusion of scalp, initial encounter: Secondary | ICD-10-CM | POA: Diagnosis not present

## 2019-11-20 DIAGNOSIS — J982 Interstitial emphysema: Secondary | ICD-10-CM | POA: Diagnosis not present

## 2019-11-20 DIAGNOSIS — I422 Other hypertrophic cardiomyopathy: Secondary | ICD-10-CM | POA: Diagnosis not present

## 2019-11-20 DIAGNOSIS — I340 Nonrheumatic mitral (valve) insufficiency: Secondary | ICD-10-CM | POA: Diagnosis not present

## 2019-11-20 DIAGNOSIS — S199XXA Unspecified injury of neck, initial encounter: Secondary | ICD-10-CM | POA: Diagnosis not present

## 2019-11-20 DIAGNOSIS — Z8249 Family history of ischemic heart disease and other diseases of the circulatory system: Secondary | ICD-10-CM | POA: Diagnosis not present

## 2019-11-20 DIAGNOSIS — W19XXXA Unspecified fall, initial encounter: Secondary | ICD-10-CM | POA: Diagnosis not present

## 2019-11-20 DIAGNOSIS — I499 Cardiac arrhythmia, unspecified: Secondary | ICD-10-CM | POA: Diagnosis not present

## 2019-11-20 DIAGNOSIS — I361 Nonrheumatic tricuspid (valve) insufficiency: Secondary | ICD-10-CM | POA: Diagnosis not present

## 2019-11-20 DIAGNOSIS — I351 Nonrheumatic aortic (valve) insufficiency: Secondary | ICD-10-CM | POA: Diagnosis not present

## 2019-11-20 DIAGNOSIS — I440 Atrioventricular block, first degree: Secondary | ICD-10-CM | POA: Diagnosis not present

## 2019-11-20 DIAGNOSIS — R55 Syncope and collapse: Secondary | ICD-10-CM | POA: Diagnosis not present

## 2019-11-20 DIAGNOSIS — I447 Left bundle-branch block, unspecified: Secondary | ICD-10-CM | POA: Diagnosis not present

## 2019-11-20 DIAGNOSIS — I491 Atrial premature depolarization: Secondary | ICD-10-CM | POA: Diagnosis not present

## 2019-11-20 DIAGNOSIS — I4430 Unspecified atrioventricular block: Secondary | ICD-10-CM | POA: Diagnosis not present

## 2019-11-20 DIAGNOSIS — T8182XA Emphysema (subcutaneous) resulting from a procedure, initial encounter: Secondary | ICD-10-CM | POA: Diagnosis not present

## 2019-11-20 DIAGNOSIS — Z95810 Presence of automatic (implantable) cardiac defibrillator: Secondary | ICD-10-CM | POA: Diagnosis not present

## 2019-11-21 LAB — BASIC METABOLIC PANEL
BUN/Creatinine Ratio: 16 (ref 9–20)
BUN: 16 mg/dL (ref 6–24)
CO2: 21 mmol/L (ref 20–29)
Calcium: 9.1 mg/dL (ref 8.7–10.2)
Chloride: 102 mmol/L (ref 96–106)
Creatinine, Ser: 1.01 mg/dL (ref 0.76–1.27)
GFR calc Af Amer: 94 mL/min/{1.73_m2} (ref >59)
GFR calc non Af Amer: 82 mL/min/{1.73_m2} (ref >59)
Glucose: 92 mg/dL (ref 65–99)
Potassium: 4.4 mmol/L (ref 3.5–5.2)
Sodium: 137 mmol/L (ref 134–144)

## 2019-11-21 LAB — CBC
Hematocrit: 46.5 % (ref 37.5–51.0)
Hemoglobin: 15.6 g/dL (ref 13.0–17.7)
MCH: 29.8 pg (ref 26.6–33.0)
MCHC: 33.5 g/dL (ref 31.5–35.7)
MCV: 89 fL (ref 79–97)
Platelets: 175 10*3/uL (ref 150–450)
RBC: 5.23 x10E6/uL (ref 4.14–5.80)
RDW: 13 % (ref 11.6–15.4)
WBC: 7.6 10*3/uL (ref 3.4–10.8)

## 2019-11-21 LAB — SARS CORONAVIRUS 2 (TAT 6-24 HRS): SARS Coronavirus 2: NEGATIVE

## 2019-11-22 ENCOUNTER — Ambulatory Visit (HOSPITAL_COMMUNITY)
Admission: RE | Admit: 2019-11-22 | Payer: BC Managed Care – PPO | Source: Home / Self Care | Admitting: Cardiovascular Disease

## 2019-11-22 ENCOUNTER — Other Ambulatory Visit: Payer: Self-pay

## 2019-11-22 ENCOUNTER — Encounter (HOSPITAL_COMMUNITY)
Admission: EM | Disposition: A | Discharge status: Home / Self Care | Payer: Self-pay | Source: Home / Self Care | Attending: Emergency Medicine

## 2019-11-22 ENCOUNTER — Emergency Department (HOSPITAL_COMMUNITY): Payer: BC Managed Care – PPO | Admitting: Certified Registered"

## 2019-11-22 ENCOUNTER — Emergency Department (HOSPITAL_COMMUNITY): Payer: BC Managed Care – PPO

## 2019-11-22 ENCOUNTER — Encounter (HOSPITAL_COMMUNITY): Payer: Self-pay

## 2019-11-22 ENCOUNTER — Emergency Department (EMERGENCY_DEPARTMENT_HOSPITAL)
Admission: EM | Admit: 2019-11-22 | Discharge: 2019-11-22 | Disposition: A | Discharge status: Home / Self Care | Payer: BC Managed Care – PPO | Source: Home / Self Care | Attending: Emergency Medicine | Admitting: Emergency Medicine

## 2019-11-22 DIAGNOSIS — R55 Syncope and collapse: Secondary | ICD-10-CM | POA: Insufficient documentation

## 2019-11-22 DIAGNOSIS — I422 Other hypertrophic cardiomyopathy: Secondary | ICD-10-CM

## 2019-11-22 DIAGNOSIS — Z7901 Long term (current) use of anticoagulants: Secondary | ICD-10-CM | POA: Insufficient documentation

## 2019-11-22 DIAGNOSIS — I4901 Ventricular fibrillation: Secondary | ICD-10-CM | POA: Insufficient documentation

## 2019-11-22 DIAGNOSIS — Z79899 Other long term (current) drug therapy: Secondary | ICD-10-CM | POA: Insufficient documentation

## 2019-11-22 DIAGNOSIS — Z20822 Contact with and (suspected) exposure to covid-19: Secondary | ICD-10-CM | POA: Insufficient documentation

## 2019-11-22 DIAGNOSIS — I2510 Atherosclerotic heart disease of native coronary artery without angina pectoris: Secondary | ICD-10-CM | POA: Insufficient documentation

## 2019-11-22 DIAGNOSIS — I119 Hypertensive heart disease without heart failure: Secondary | ICD-10-CM | POA: Insufficient documentation

## 2019-11-22 DIAGNOSIS — Z95810 Presence of automatic (implantable) cardiac defibrillator: Secondary | ICD-10-CM

## 2019-11-22 LAB — CBC WITH DIFFERENTIAL/PLATELET
Abs Immature Granulocytes: 0.02 10*3/uL (ref 0.00–0.07)
Basophils Absolute: 0 10*3/uL (ref 0.0–0.1)
Basophils Relative: 0 %
Eosinophils Absolute: 0 10*3/uL (ref 0.0–0.5)
Eosinophils Relative: 1 %
HCT: 45.6 % (ref 39.0–52.0)
Hemoglobin: 14.6 g/dL (ref 13.0–17.0)
Immature Granulocytes: 0 %
Lymphocytes Relative: 16 %
Lymphs Abs: 1.1 10*3/uL (ref 0.7–4.0)
MCH: 29.1 pg (ref 26.0–34.0)
MCHC: 32 g/dL (ref 30.0–36.0)
MCV: 91 fL (ref 80.0–100.0)
Monocytes Absolute: 0.4 10*3/uL (ref 0.1–1.0)
Monocytes Relative: 6 %
Neutro Abs: 5.6 10*3/uL (ref 1.7–7.7)
Neutrophils Relative %: 77 %
Platelets: 181 10*3/uL (ref 150–400)
RBC: 5.01 MIL/uL (ref 4.22–5.81)
RDW: 13.6 % (ref 11.5–15.5)
WBC: 7.2 10*3/uL (ref 4.0–10.5)
nRBC: 0 % (ref 0.0–0.2)

## 2019-11-22 LAB — BASIC METABOLIC PANEL
Anion gap: 14 (ref 5–15)
BUN: 14 mg/dL (ref 6–20)
CO2: 17 mmol/L — ABNORMAL LOW (ref 22–32)
Calcium: 9.2 mg/dL (ref 8.9–10.3)
Chloride: 109 mmol/L (ref 98–111)
Creatinine, Ser: 1.12 mg/dL (ref 0.61–1.24)
GFR calc Af Amer: >60 mL/min (ref >60)
GFR calc non Af Amer: >60 mL/min (ref >60)
Glucose, Bld: 96 mg/dL (ref 70–99)
Potassium: 4.3 mmol/L (ref 3.5–5.1)
Sodium: 140 mmol/L (ref 135–145)

## 2019-11-22 LAB — TROPONIN I (HIGH SENSITIVITY): Troponin I (High Sensitivity): 28 ng/L — ABNORMAL HIGH

## 2019-11-22 LAB — SARS CORONAVIRUS 2 BY RT PCR (HOSPITAL ORDER, PERFORMED IN ~~LOC~~ HOSPITAL LAB): SARS Coronavirus 2: NEGATIVE

## 2019-11-22 LAB — MAGNESIUM: Magnesium: 1.7 mg/dL (ref 1.7–2.4)

## 2019-11-22 SURGERY — CANCELLED PROCEDURE

## 2019-11-22 MED ORDER — MAGNESIUM OXIDE 400 MG PO TABS: 400.0000 mg | ORAL_TABLET | Freq: Every day | ORAL | 6 refills | Status: DC

## 2019-11-22 MED ORDER — SODIUM CHLORIDE 0.9 % IV BOLUS
500.0000 mL | Freq: Once | INTRAVENOUS | Status: AC
  Administered 2019-11-22: 500 mL via INTRAVENOUS

## 2019-11-22 MED ORDER — MAGNESIUM SULFATE 2 GM/50ML IV SOLN
2.0000 g | Freq: Once | INTRAVENOUS | Status: AC
  Administered 2019-11-22: 2 g via INTRAVENOUS
  Filled 2019-11-22: qty 50

## 2019-11-22 MED ORDER — MAGNESIUM SULFATE 50 % IJ SOLN: 2.0000 g | Freq: Once | INTRAMUSCULAR | Status: DC

## 2019-11-22 MED ORDER — MAGNESIUM SULFATE 2 GM/50ML IV SOLN: 2.0000 g | Freq: Once | INTRAVENOUS | Status: DC

## 2019-11-22 NOTE — ED Triage Notes (Signed)
Pt BIB [facility] from [address] after a syncopal episode. Pt was driving to an outpatient clinic [facility] for a cardioversion with his wife. Pt was driving when he felt funny. Per wife pt postured up went unresponsive for a minute and was grey, pale and diaphoretic. They managed to pull the car over and call EMS. Pt denies CP but states he feels funny. Pt is currently in NSR.
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
## 2019-11-22 NOTE — Discharge Instructions (Signed)
Please do NOT drive for another 6 months.  Follow up with Dr. Graciela Husbands in the office.

## 2019-11-22 NOTE — ED Notes (Signed)
Patient verbalized understanding of DC instructions, Rx, follow up care. Ambulatory.

## 2019-11-22 NOTE — Anesthesia Preprocedure Evaluation (Signed)
Anesthesia Evaluation    Reviewed: Allergy & Precautions, Patient's Chart, lab work & pertinent test results  History of Anesthesia Complications Negative for: history of anesthetic complications  Airway        Dental   Pulmonary neg pulmonary ROS,           Cardiovascular hypertension, Pt. on home beta blockers and Pt. on medications + dysrhythmias Atrial Fibrillation      Neuro/Psych  Headaches, negative psych ROS   GI/Hepatic negative GI ROS, Neg liver ROS,   Endo/Other  negative endocrine ROS  Renal/GU negative Renal ROS     Musculoskeletal negative musculoskeletal ROS (+)   Abdominal Normal abdominal exam  (+)   Peds  Hematology negative hematology ROS (+)   Anesthesia Other Findings   Reproductive/Obstetrics                             Anesthesia Physical  Anesthesia Plan  ASA: III  Anesthesia Plan: General   Post-op Pain Management:    Induction: Intravenous  PONV Risk Score and Plan: 0  Airway Management Planned: Natural Airway and Simple Face Mask  Additional Equipment: None  Intra-op Plan:   Post-operative Plan:   Informed Consent:   Plan Discussed with:   Anesthesia Plan Comments:         Anesthesia Quick Evaluation

## 2019-11-23 ENCOUNTER — Emergency Department (HOSPITAL_COMMUNITY): Payer: BC Managed Care – PPO

## 2019-11-23 ENCOUNTER — Inpatient Hospital Stay (HOSPITAL_COMMUNITY)
Admission: EM | Admit: 2019-11-23 | Discharge: 2019-11-26 | DRG: 309 | Disposition: A | Discharge status: Home / Self Care | Payer: BC Managed Care – PPO | Attending: Internal Medicine | Admitting: Internal Medicine

## 2019-11-23 ENCOUNTER — Telehealth: Payer: Self-pay | Admitting: Student

## 2019-11-23 DIAGNOSIS — I422 Other hypertrophic cardiomyopathy: Secondary | ICD-10-CM | POA: Diagnosis present

## 2019-11-23 DIAGNOSIS — Z20822 Contact with and (suspected) exposure to covid-19: Secondary | ICD-10-CM | POA: Diagnosis present

## 2019-11-23 DIAGNOSIS — I447 Left bundle-branch block, unspecified: Secondary | ICD-10-CM | POA: Diagnosis present

## 2019-11-23 DIAGNOSIS — Z9049 Acquired absence of other specified parts of digestive tract: Secondary | ICD-10-CM | POA: Diagnosis not present

## 2019-11-23 DIAGNOSIS — I351 Nonrheumatic aortic (valve) insufficiency: Secondary | ICD-10-CM | POA: Diagnosis not present

## 2019-11-23 DIAGNOSIS — I429 Cardiomyopathy, unspecified: Secondary | ICD-10-CM

## 2019-11-23 DIAGNOSIS — S0003XA Contusion of scalp, initial encounter: Secondary | ICD-10-CM | POA: Diagnosis present

## 2019-11-23 DIAGNOSIS — R55 Syncope and collapse: Secondary | ICD-10-CM | POA: Diagnosis present

## 2019-11-23 DIAGNOSIS — Z95810 Presence of automatic (implantable) cardiac defibrillator: Secondary | ICD-10-CM | POA: Diagnosis not present

## 2019-11-23 DIAGNOSIS — W19XXXA Unspecified fall, initial encounter: Secondary | ICD-10-CM | POA: Diagnosis present

## 2019-11-23 DIAGNOSIS — I472 Ventricular tachycardia, unspecified: Secondary | ICD-10-CM

## 2019-11-23 DIAGNOSIS — I340 Nonrheumatic mitral (valve) insufficiency: Secondary | ICD-10-CM | POA: Diagnosis present

## 2019-11-23 DIAGNOSIS — Z8249 Family history of ischemic heart disease and other diseases of the circulatory system: Secondary | ICD-10-CM | POA: Diagnosis not present

## 2019-11-23 DIAGNOSIS — R778 Other specified abnormalities of plasma proteins: Secondary | ICD-10-CM | POA: Diagnosis present

## 2019-11-23 DIAGNOSIS — I4819 Other persistent atrial fibrillation: Secondary | ICD-10-CM | POA: Diagnosis present

## 2019-11-23 DIAGNOSIS — Z79899 Other long term (current) drug therapy: Secondary | ICD-10-CM

## 2019-11-23 DIAGNOSIS — I361 Nonrheumatic tricuspid (valve) insufficiency: Secondary | ICD-10-CM | POA: Diagnosis not present

## 2019-11-23 DIAGNOSIS — R001 Bradycardia, unspecified: Secondary | ICD-10-CM | POA: Diagnosis present

## 2019-11-23 DIAGNOSIS — Z7901 Long term (current) use of anticoagulants: Secondary | ICD-10-CM | POA: Diagnosis not present

## 2019-11-23 LAB — BASIC METABOLIC PANEL
Anion gap: 10 (ref 5–15)
BUN: 13 mg/dL (ref 6–20)
CO2: 22 mmol/L (ref 22–32)
Calcium: 9 mg/dL (ref 8.9–10.3)
Chloride: 106 mmol/L (ref 98–111)
Creatinine, Ser: 1.08 mg/dL (ref 0.61–1.24)
GFR calc Af Amer: >60 mL/min (ref >60)
GFR calc non Af Amer: >60 mL/min (ref >60)
Glucose, Bld: 117 mg/dL — ABNORMAL HIGH (ref 70–99)
Potassium: 4.6 mmol/L (ref 3.5–5.1)
Sodium: 138 mmol/L (ref 135–145)

## 2019-11-23 LAB — CBC WITH DIFFERENTIAL/PLATELET
Abs Immature Granulocytes: 0.04 10*3/uL (ref 0.00–0.07)
Basophils Absolute: 0 10*3/uL (ref 0.0–0.1)
Basophils Relative: 0 %
Eosinophils Absolute: 0 10*3/uL (ref 0.0–0.5)
Eosinophils Relative: 1 %
HCT: 49.2 % (ref 39.0–52.0)
Hemoglobin: 15.6 g/dL (ref 13.0–17.0)
Immature Granulocytes: 1 %
Lymphocytes Relative: 16 %
Lymphs Abs: 1.3 10*3/uL (ref 0.7–4.0)
MCH: 29.1 pg (ref 26.0–34.0)
MCHC: 31.7 g/dL (ref 30.0–36.0)
MCV: 91.6 fL (ref 80.0–100.0)
Monocytes Absolute: 0.5 10*3/uL (ref 0.1–1.0)
Monocytes Relative: 7 %
Neutro Abs: 6.1 10*3/uL (ref 1.7–7.7)
Neutrophils Relative %: 75 %
Platelets: 207 10*3/uL (ref 150–400)
RBC: 5.37 MIL/uL (ref 4.22–5.81)
RDW: 13.9 % (ref 11.5–15.5)
WBC: 8 10*3/uL (ref 4.0–10.5)
nRBC: 0 % (ref 0.0–0.2)

## 2019-11-23 LAB — HEPATIC FUNCTION PANEL
ALT: 25 U/L (ref 0–44)
AST: 33 U/L (ref 15–41)
Albumin: 3.7 g/dL (ref 3.5–5.0)
Alkaline Phosphatase: 50 U/L (ref 38–126)
Bilirubin, Direct: 0.4 mg/dL — ABNORMAL HIGH (ref 0.0–0.2)
Indirect Bilirubin: 2.3 mg/dL — ABNORMAL HIGH (ref 0.3–0.9)
Total Bilirubin: 2.7 mg/dL — ABNORMAL HIGH (ref 0.3–1.2)
Total Protein: 6.5 g/dL (ref 6.5–8.1)

## 2019-11-23 LAB — TROPONIN I (HIGH SENSITIVITY): Troponin I (High Sensitivity): 63 ng/L — ABNORMAL HIGH (ref <18)

## 2019-11-23 LAB — MAGNESIUM: Magnesium: 2 mg/dL (ref 1.7–2.4)

## 2019-11-23 MED ORDER — MAGNESIUM OXIDE 400 (241.3 MG) MG PO TABS
400.0000 mg | ORAL_TABLET | Freq: Every day | ORAL | Status: DC
  Administered 2019-11-24 – 2019-11-26 (×3): 400 mg via ORAL
  Filled 2019-11-23 (×3): qty 1

## 2019-11-23 MED ORDER — ACETAMINOPHEN 325 MG PO TABS: 650.0000 mg | ORAL_TABLET | ORAL | Status: DC | PRN

## 2019-11-23 MED ORDER — ATENOLOL 25 MG PO TABS
100.0000 mg | ORAL_TABLET | Freq: Every day | ORAL | Status: DC
  Administered 2019-11-23 – 2019-11-25 (×3): 100 mg via ORAL
  Filled 2019-11-23 (×3): qty 4

## 2019-11-23 MED ORDER — APIXABAN 5 MG PO TABS
5.0000 mg | ORAL_TABLET | Freq: Two times a day (BID) | ORAL | Status: DC
  Administered 2019-11-23 – 2019-11-26 (×6): 5 mg via ORAL
  Filled 2019-11-23 (×6): qty 1

## 2019-11-23 MED ORDER — MAGNESIUM OXIDE 400 MG PO TABS: 400.0000 mg | ORAL_TABLET | Freq: Every day | ORAL | Status: DC

## 2019-11-23 MED ORDER — SODIUM CHLORIDE 0.9 % IV SOLN: 250.0000 mL | INTRAVENOUS | Status: DC | PRN

## 2019-11-23 MED ORDER — SODIUM CHLORIDE 0.9% FLUSH: 3.0000 mL | INTRAVENOUS | Status: DC | PRN

## 2019-11-23 MED ORDER — ONDANSETRON HCL 4 MG/2ML IJ SOLN: 4.0000 mg | Freq: Four times a day (QID) | INTRAMUSCULAR | Status: DC | PRN

## 2019-11-23 MED ORDER — SODIUM CHLORIDE 0.9% FLUSH
3.0000 mL | Freq: Two times a day (BID) | INTRAVENOUS | Status: DC
  Administered 2019-11-23 – 2019-11-26 (×5): 3 mL via INTRAVENOUS

## 2019-11-23 MED ORDER — DOFETILIDE 500 MCG PO CAPS
500.0000 ug | ORAL_CAPSULE | Freq: Two times a day (BID) | ORAL | Status: DC
  Administered 2019-11-23 – 2019-11-26 (×6): 500 ug via ORAL
  Filled 2019-11-23 (×7): qty 1

## 2019-11-23 MED ORDER — LORATADINE 10 MG PO TABS: 10.0000 mg | ORAL_TABLET | Freq: Every day | ORAL | Status: DC | PRN

## 2019-11-23 NOTE — H&P (Addendum)
ELECTROPHYSIOLOGY CONSULT NOTE    Patient ID: Donald Bond MRN: [id number], DOB/AGE: December 02, 1960 59 y.o.  Admit date: 11/23/2019 Date of Consult: 11/23/2019  Primary Physician: Medicine, [facility] Primary Cardiologist: No primary care provider on file.  Electrophysiologist: Dr. Graciela Husbands  Reason for Admission: Multiple ICD shocks  Patient Profile: Donald Bond is a 59 y.o. male with a history of HCM associated with syncope and previously treated VT, persistent AF, Chronic anticoag with Eliquis, LBBB, and sleep disordered breathing who is being seen today for the evaluation of ICD shock at the request of Dr. Lockie Mola.  HPI:  Donald Bond is a 59 y.o. male with medical history above.   Seen in [facility] 9/22 with ICD that was felt to be primary to his cardiomyopathy with lack of literature associating ranolazine with proarryhtmia. Pts electrolytes were supplemented and he was discharged home with close follow up with instructions to not drive for 6 months per DMV guidelines.   Unfortunately, pt was at work this am speaking to a coworker when he felt similar to yesterday prior to his shock. Then he remembers waking up on the floor with "people all around him". EMS was called. Pt has not felt any of the shocks, presumably due to being unconscious. He states EMS told him he was shocked multiple times by his device based on rhythm strips on scene, but by device interrogation he appears to have only received 1 ATP and 1 shock, and otherwise converted on his own.   Pertinent work up on admission includes CT due to fall which showed no acute intracranial process. He does have tiny scatter right facial and intraorbital foci of subcutaneous emphysema with no definite fracture. CXR WNL. Hgb 15.6,  K 4.6, Cr 1.08, Mg 2.0, HS-Trop 63.   He is awake and alert x 3 currently. No current complaints, just frustrated with recurrent shocks.   Past Medical History:  Diagnosis Date  . AICD  (automatic cardioverter/defibrillator) present X 4  . Headache    "weekly" (06/15/2017)  . History of kidney stones   . Hypertr obst cardiomyop   . ICD--[facility]    Dr. Graciela Husbands  . Orthostatic lightheadedness   . SYNCOPE   . TIREDNESS   . Ventricular tachycardia (HCC)    rx via ICD     Surgical History:  Past Surgical History:  Procedure Laterality Date  . CARDIAC CATHETERIZATION    . CARDIAC DEFIBRILLATOR PLACEMENT  ~ 2000; ~ 2010   "I've had 4" (06/15/2017)  . CARDIOVERSION  06/29/2016   Procedure: Cardioversion;  Surgeon: Duke Salvia, MD;  Location[facility] INVASIVE CV LAB;  Service: Cardiovascular;;  . CARDIOVERSION N/A 06/17/2017   Procedure: CARDIOVERSION;  Surgeon: Jake Bathe, MD;  Location: [facility] ENDOSCOPY;  Service: Cardiovascular;  Laterality: N/A;  . CARDIOVERSION N/A 07/11/2019   Procedure: CARDIOVERSION;  Surgeon: Chrystie Nose, MD;  Location: [facility] ENDOSCOPY;  Service: Cardiovascular;  Laterality: N/A;  . Guidant Vitality ICD Impantation--Guidant Vitality T135--11/13/2003    . HYDROCELE EXCISION / REPAIR Left 04/2013  . ICD GENERATOR CHANGEOUT N/A 06/29/2016   Procedure: ICD Generator Changeout;  Surgeon: Duke Salvia, MD;  Location: [facility] INVASIVE CV LAB;  Service: Cardiovascular;  Laterality: N/A;  . LAPAROSCOPIC CHOLECYSTECTOMY  2003  . LITHOTRIPSY  X 1     (Not in a hospital admission)   Inpatient Medications:   Allergies: No Known Allergies  Social History   Socioeconomic History  . Marital status: Married  Spouse name: Not on file  . Number of children: Not on file  . Years of education: Not on file  . Highest education level: Not on file  Occupational History  . Not on file  Tobacco Use  . Smoking status: Never Smoker  . Smokeless tobacco: Former Neurosurgeon    Types: Engineer, drilling  . Vaping Use: Never used  Substance and Sexual Activity  . Alcohol use: Yes    Alcohol/week: 3.0 standard drinks    Types: 3 Cans of beer per week  . Drug use: No  .  Sexual activity: Yes  Other Topics Concern  . Not on file  Social History Narrative   Lives with spouse in [place]   employed   Social Determinants of Health   Financial Resource Strain:   . Difficulty of Paying Living Expenses: Not on file  Food Insecurity:   . Worried About Programme researcher, broadcasting/film/video in the Last Year: Not on file  . Ran Out of Food in the Last Year: Not on file  Transportation Needs:   . Lack of Transportation (Medical): Not on file  . Lack of Transportation (Non-Medical): Not on file  Physical Activity:   . Days of Exercise per Week: Not on file  . Minutes of Exercise per Session: Not on file  Stress:   . Feeling of Stress : Not on file  Social Connections:   . Frequency of Communication with Friends and Family: Not on file  . Frequency of Social Gatherings with Friends and Family: Not on file  . Attends Religious Services: Not on file  . Active Member of Clubs or Organizations: Not on file  . Attends Banker Meetings: Not on file  . Marital Status: Not on file  Intimate Partner Violence:   . Fear of Current or Ex-Partner: Not on file  . Emotionally Abused: Not on file  . Physically Abused: Not on file  . Sexually Abused: Not on file     Family History  Problem Relation Age of Onset  . ALS Father   . Hypertension Other   . Heart attack Brother      Review of Systems: All other systems reviewed and are otherwise negative except as noted above.  Physical Exam: Vitals:   11/23/19 1100 11/23/19 1115  BP: 124/83 120/88  Pulse: (!) 111 (!) 52  Resp: (!) 27 (!) 25  SpO2: 98% 97%    GEN- The patient is well appearing, alert and oriented x 3 today.   HEENT: normocephalic, atraumatic; sclera clear, conjunctiva pink; hearing intact; oropharynx clear; neck supple Lungs- Clear to ausculation bilaterally, normal work of breathing.  No wheezes, rales, rhonchi Heart- Regular rate and rhythm, no murmurs, rubs or gallops GI- soft, non-tender,  non-distended, bowel sounds present Extremities- no clubbing, cyanosis, or edema; DP/PT/radial pulses 2+ bilaterally MS- no significant deformity or atrophy Skin- warm and dry, no rash or lesion Psych- euthymic mood, full affect Neuro- strength and sensation are intact  Labs:   Lab Results  Component Value Date   WBC 8.0 11/23/2019   HGB 15.6 11/23/2019   HCT 49.2 11/23/2019   MCV 91.6 11/23/2019   PLT 207 11/23/2019    Recent Labs  Lab 11/23/19 1025  NA 138  K 4.6  CL 106  CO2 22  BUN 13  CREATININE 1.08  CALCIUM 9.0  PROT 6.5  BILITOT 2.7*  ALKPHOS 50  ALT 25  AST 33  GLUCOSE 117*  Radiology/Studies: CT Head Wo Contrast  Result Date: 11/23/2019 CLINICAL DATA:  Trauma EXAM: CT HEAD WITHOUT CONTRAST CT CERVICAL SPINE WITHOUT CONTRAST TECHNIQUE: Multidetector CT imaging of the head and cervical spine was performed following the standard protocol without intravenous contrast. Multiplanar CT image reconstructions of the cervical spine were also generated. COMPARISON:  None. FINDINGS: CT HEAD FINDINGS Brain: No acute infarct or intracranial hemorrhage. No mass lesion. No midline shift, ventriculomegaly or extra-axial fluid collection. Vascular: No hyperdense vessel or unexpected calcification. Skull: No definite fracture.  No focal lesion. Sinuses/Orbits: Normal orbits. Clear paranasal sinuses. No mastoid effusion. Other: Tiny scattered foci of subcutaneous emphysema within the right face and intraorbital soft tissues. CT CERVICAL SPINE FINDINGS Alignment: Normal. Skull base and vertebrae: No acute fracture. No primary bone lesion or focal pathologic process. Soft tissues and spinal canal: No prevertebral fluid or swelling. No visible canal hematoma. Disc levels: No significant spinal canal or neural foraminal narrowing. Upper chest: Partially imaged bilateral pleural effusions. Dependent atelectasis. Other: None. IMPRESSION: HEAD CT: No acute intracranial process. Tiny scatter  right facial and intraorbital foci of subcutaneous emphysema. No definite fracture identified. Consider maxillofacial CT for further evaluation if suspicion persists. CT CERVICAL SPINE : No fracture or traumatic listhesis. Electronically Signed   By: Stana Bunting M.D.   On: 11/23/2019 11:15   CT Cervical Spine Wo Contrast  Result Date: 11/23/2019 CLINICAL DATA:  Trauma EXAM: CT HEAD WITHOUT CONTRAST CT CERVICAL SPINE WITHOUT CONTRAST TECHNIQUE: Multidetector CT imaging of the head and cervical spine was performed following the standard protocol without intravenous contrast. Multiplanar CT image reconstructions of the cervical spine were also generated. COMPARISON:  None. FINDINGS: CT HEAD FINDINGS Brain: No acute infarct or intracranial hemorrhage. No mass lesion. No midline shift, ventriculomegaly or extra-axial fluid collection. Vascular: No hyperdense vessel or unexpected calcification. Skull: No definite fracture.  No focal lesion. Sinuses/Orbits: Normal orbits. Clear paranasal sinuses. No mastoid effusion. Other: Tiny scattered foci of subcutaneous emphysema within the right face and intraorbital soft tissues. CT CERVICAL SPINE FINDINGS Alignment: Normal. Skull base and vertebrae: No acute fracture. No primary bone lesion or focal pathologic process. Soft tissues and spinal canal: No prevertebral fluid or swelling. No visible canal hematoma. Disc levels: No significant spinal canal or neural foraminal narrowing. Upper chest: Partially imaged bilateral pleural effusions. Dependent atelectasis. Other: None. IMPRESSION: HEAD CT: No acute intracranial process. Tiny scatter right facial and intraorbital foci of subcutaneous emphysema. No definite fracture identified. Consider maxillofacial CT for further evaluation if suspicion persists. CT CERVICAL SPINE : No fracture or traumatic listhesis. Electronically Signed   By: Stana Bunting M.D.   On: 11/23/2019 11:15   DG Chest Portable 1 View  Result  Date: 11/23/2019 CLINICAL DATA:  Fall EXAM: PORTABLE CHEST 1 VIEW COMPARISON:  11/22/2019 FINDINGS: Left AICD remains in place, unchanged. Cardiomegaly. No confluent opacities, effusions or edema. No acute bony abnormality. IMPRESSION: Cardiomegaly.  No active disease. Electronically Signed   By: Charlett Nose M.D.   On: 11/23/2019 10:36   DG Chest Portable 1 View  Result Date: 11/22/2019 CLINICAL DATA:  Near syncope EXAM: PORTABLE CHEST 1 VIEW COMPARISON:  11/13/2003 FINDINGS: Cardiomegaly with left chest single lead pacer defibrillator. Both lungs are clear. The visualized skeletal structures are unremarkable. IMPRESSION: Cardiomegaly without acute abnormality of the lungs in AP portable projection. Electronically Signed   By: Lauralyn Primes M.D.   On: 11/22/2019 11:01   CUP PACEART INCLINIC DEVICE CHECK  Result Date: 11/14/2019 Device check completed  by industry.  See scanned report for details.  Next remote check scheduled 02/02/20.Judy Pimple, BSN, RN  CUP PACEART REMOTE DEVICE CHECK  Result Date: 11/05/2019 Scheduled remote reviewed. Normal device function.  NST/VT-1 events appears SVT and AF w/ RVR. Overall rates controlled Known PAF on tikosyn and eliquis. Next remote 91 days. JM   EKG: on arrival shows NSR at 66 bpm (personally reviewed)  TELEMETRY: NSR since arrival (personally reviewed)  DEVICE HISTORY: 3 previous devices. Most recently underwent gen change 2018 w AEGIS pouch.  [employer] single chamber ICD  Assessment/Plan: 1.  VF with appropriate therapy in HCM Pt with syncopal episode and ICD shock yesterday, and again today.  Mg supped and WNL today. Cr and K stable. No acute illness.  Echo 05/2016 showed severe focal basal and moderate concentric hypertrophy. No evidence of anterior MV or chordal SAM and no LVOT outflow gradient noted at that time. Normal EF at that time. Had planned for repeat as outpatient, but with recurrent may be reasonable to check while he's here to make sure  we don't need to adjust his medical therapies.  QTc OK when corrected for QRS.  HS troponin mildly elevated in setting of ICD shock. No torsades noted, do not expect this was related to tikosyn. Though we could not find clear literate relating ranolazine to proarryhtmia, we have thus far not identified other cause. At this time will STOP ranolazine.  If VT recurs, will need to consider stopping tikosyn and putting on amiodarone.  Will have [employer] review his episodes and programming as well.    2. LBBB/1AVB QRS ~150, chronically.   3. Persistent atrial fibrillation On tikosyn and recently started on ranolazine.  He remains in NSR on presentation today. He feels like he converted to NSR over the weekend on ranolazine.   With recurrent shock we will admit for further evaluation and observation.   Seen with Dr. Lalla Brothers. We have adjusted his VT therapies to be more aggressive and added an additional zone of therapy for rates between 160 - 210.   For questions or updates, please contact [facility] Please consult [email or web address] for contact info under Cardiology/STEMI.  Signed, Graciella Freer, PA-C  11/23/2019 12:02 PM    ----------------------------------------------------------------------------------------------------- I have seen, examined the patient, and reviewed the above assessment and plan.    Donald Bond is a 59yo man with HCM s/p ICD who is presenting to the ER after syncope/ICD shock today. He also had an episode yesterday. Device interrogation shows appropriate shock with what appears to be a PVC triggering brief segment of PMVT followed by a more monomorphic VT. The device appropriately detected the rhythm and then shocked. The episode today was slower than the prior episodes. Review of the previous episodes show VF.   The exact cause of the episodes is unclear.  - He was recently started on Ranolazine which is suspicious. We will discuss potential  interactions of the ranolazine with his tikosyn.  - Device interrogation does show what looks like a PVC near the onset of the VT.  - His ECG shows a QTc that is just above once corrected for the wide QRS.   Will need to monitor this after the ranolazine has washed out. Possibly will need to decrease tikosyn dose.  Otherwise has been feeling well. No HF symptoms, no fevers/chills, no other recent changes to medications.  Plan to admit to inpatient, hold the ranolazine and monitor on telemetry. Will update his echo to confirm  no changes to his LV function.   Sheria Lang T. Lalla Brothers, MD, [facility] Cardiac Electrophysiology

## 2019-11-23 NOTE — ED Notes (Signed)
zoll pads placed on pt 

## 2019-11-23 NOTE — ED Provider Notes (Signed)
[facility] EMERGENCY DEPARTMENT Provider Note   CSN: [id number] Arrival date & time:        History Chief Complaint  Patient presents with  . Loss of Consciousness    Donald Bond is a 59 y.o. male.  Patient had defibrillation yesterday as well. Was seen by cardiology and was discharged. Recently started on Ranexa has been taking Tikosyn and is on Eliquis. He did hit the back of his head with a syncopal episode today. Overall now he does not have any chest pain, no weakness or numbness.  The history is provided by the patient.  Loss of Consciousness Episode history:  Multiple Most recent episode:  Today Progression:  Resolved Chronicity:  Recurrent Context comment:  Patient with history of hypertrophic cardiomyopathy status post ICD who presents to the emergency department after a syncopal episode in which he was defibrillated. Appears that he had 2 more episodes of defibrillation but was not overly symptomatic. Worsened by:  Nothing Associated symptoms: no chest pain, no dizziness, no fever, no headaches, no palpitations, no seizures, no shortness of breath and no vomiting        Past Medical History:  Diagnosis Date  . AICD (automatic cardioverter/defibrillator) present X 4  . Headache    "weekly" (06/15/2017)  . History of kidney stones   . Hypertr obst cardiomyop   . ICD--[facility]    Dr. Graciela Husbands  . Orthostatic lightheadedness   . SYNCOPE   . TIREDNESS   . Ventricular tachycardia (HCC)    rx via ICD    Patient Active Problem List   Diagnosis Date Noted  . Persistent atrial fibrillation (HCC)   . Palpitations 07/03/2019  . PAF (paroxysmal atrial fibrillation) (HCC)   . Visit for monitoring Tikosyn therapy 06/15/2017  . Hypertension 11/04/2016  . Ventricular fibrillation/polymorphic ventricular tachycardia 06/04/2011  . SYNCOPE 10/22/2009  . TIREDNESS 10/22/2009  . CHEST PAIN UNSPECIFIED 03/05/2009  . Hypertrophic cardiomyopathy (HCC)  06/05/2008  . Implantable cardioverter-defibrillator (ICD) in situ 06/05/2008    Past Surgical History:  Procedure Laterality Date  . CARDIAC CATHETERIZATION    . CARDIAC DEFIBRILLATOR PLACEMENT  ~ 2000; ~ 2010   "I've had 4" (06/15/2017)  . CARDIOVERSION  06/29/2016   Procedure: Cardioversion;  Surgeon: Duke Salvia, MD;  Location: [facility] INVASIVE CV LAB;  Service: Cardiovascular;;  . CARDIOVERSION N/A 06/17/2017   Procedure: CARDIOVERSION;  Surgeon: Jake Bathe, MD;  Location: [facility] ENDOSCOPY;  Service: Cardiovascular;  Laterality: N/A;  . CARDIOVERSION N/A 07/11/2019   Procedure: CARDIOVERSION;  Surgeon: Chrystie Nose, MD;  Location: [facility] ENDOSCOPY;  Service: Cardiovascular;  Laterality: N/A;  . Guidant Vitality ICD Impantation--Guidant Vitality T135--11/13/2003    . HYDROCELE EXCISION / REPAIR Left 04/2013  . ICD GENERATOR CHANGEOUT N/A 06/29/2016   Procedure: ICD Generator Changeout;  Surgeon: Duke Salvia, MD;  Location: [facility] INVASIVE CV LAB;  Service: Cardiovascular;  Laterality: N/A;  . LAPAROSCOPIC CHOLECYSTECTOMY  2003  . LITHOTRIPSY  X 1       Family History  Problem Relation Age of Onset  . ALS Father   . Hypertension Other   . Heart attack Brother     Social History   Tobacco Use  . Smoking status: Never Smoker  . Smokeless tobacco: Former Neurosurgeon    Types: Engineer, drilling  . Vaping Use: Never used  Substance Use Topics  . Alcohol use: Yes    Alcohol/week: 3.0 standard drinks    Types: 3 Cans  of beer per week  . Drug use: No    Home Medications Prior to Admission medications   Medication Sig Start Date End Date Taking? Authorizing Provider  apixaban (ELIQUIS) 5 MG TABS tablet Take 1 tablet (5 mg total) by mouth 2 (two) times daily. 11/13/19   Duke Salvia, MD  atenolol (TENORMIN) 100 MG tablet Take 1 tablet (100 mg total) by mouth daily. 11/13/19 02/11/20  Duke Salvia, MD  dofetilide (TIKOSYN) 500 MCG capsule Take 1 capsule (500 mcg total) by mouth 2  (two) times daily. 11/13/19   Duke Salvia, MD  ibuprofen (ADVIL,MOTRIN) 200 MG tablet Take 800 mg by mouth every 4 (four) hours as needed for headache or moderate pain.     [provider]  loratadine (CLARITIN) 10 MG tablet Take 10 mg by mouth daily as needed for allergies.     [provider]  magnesium oxide (MAG-OX) 400 MG tablet Take 1 tablet (400 mg total) by mouth daily. 11/22/19   Graciella Freer, PA-C  ranolazine (RANEXA) 500 MG 12 hr tablet Take 1 tablet (500 mg total) by mouth 2 (two) times daily. 11/13/19   Duke Salvia, MD    Allergies    Patient has no known allergies.  Review of Systems   Review of Systems  Constitutional: Negative for chills and fever.  HENT: Negative for ear pain and sore throat.   Eyes: Negative for pain and visual disturbance.  Respiratory: Negative for cough and shortness of breath.   Cardiovascular: Positive for syncope. Negative for chest pain and palpitations.  Gastrointestinal: Negative for abdominal pain and vomiting.  Genitourinary: Negative for dysuria and hematuria.  Musculoskeletal: Negative for arthralgias and back pain.  Skin: Positive for wound. Negative for color change and rash.  Neurological: Negative for dizziness, tremors, seizures, facial asymmetry, speech difficulty, light-headedness and headaches.  All other systems reviewed and are negative.   Physical Exam Updated Vital Signs  ED Triage Vitals  Enc Vitals Group     BP 11/23/19 1100 124/83     Pulse Rate 11/23/19 1100 (!) 111     Resp 11/23/19 1100 (!) 27     Temp --      Temp src --      SpO2 11/23/19 1100 98 %     Weight --      Height --      Head Circumference --      Peak Flow --      Pain Score 11/23/19 1031 0     Pain Loc --      Pain Edu? --      Excl. in GC? --      Physical Exam Vitals and nursing note reviewed.  Constitutional:      General: He is not in acute distress.    Appearance: He is well-developed. He is not  ill-appearing.  HENT:     Head:     Comments: Hematoma to occiput     Nose: Nose normal.     Mouth/Throat:     Mouth: Mucous membranes are moist.  Eyes:     Extraocular Movements: Extraocular movements intact.     Conjunctiva/sclera: Conjunctivae normal.     Pupils: Pupils are equal, round, and reactive to light.  Cardiovascular:     Rate and Rhythm: Normal rate and regular rhythm.     Pulses: Normal pulses.     Heart sounds: Normal heart sounds. No murmur heard.   Pulmonary:  Effort: Pulmonary effort is normal. No respiratory distress.     Breath sounds: Normal breath sounds.  Abdominal:     General: Abdomen is flat.     Palpations: Abdomen is soft.     Tenderness: There is no abdominal tenderness.  Musculoskeletal:     Cervical back: Normal range of motion and neck supple.  Skin:    General: Skin is warm and dry.     Capillary Refill: Capillary refill takes less than 2 seconds.  Neurological:     General: No focal deficit present.     Mental Status: He is alert and oriented to person, place, and time.     Cranial Nerves: No cranial nerve deficit.     Sensory: No sensory deficit.     Motor: No weakness.     Coordination: Coordination normal.     Gait: Gait normal.     Comments: 5+ out of 5 strength throughout, normal sensation, no drift, normal finger-nose-finger     ED Results / Procedures / Treatments   Labs (all labs ordered are listed, but only abnormal results are displayed) Labs Reviewed  BASIC METABOLIC PANEL - Abnormal; Notable for the following components:      Result Value   Glucose, Bld 117 (*)    All other components within normal limits  HEPATIC FUNCTION PANEL - Abnormal; Notable for the following components:   Total Bilirubin 2.7 (*)    Bilirubin, Direct 0.4 (*)    Indirect Bilirubin 2.3 (*)    All other components within normal limits  TROPONIN I (HIGH SENSITIVITY) - Abnormal; Notable for the following components:   Troponin I (High  Sensitivity) 63 (*)    All other components within normal limits  CBC WITH DIFFERENTIAL/PLATELET  MAGNESIUM  TROPONIN I (HIGH SENSITIVITY)    EKG None  Radiology CT Head Wo Contrast  Result Date: 11/23/2019 CLINICAL DATA:  Trauma EXAM: CT HEAD WITHOUT CONTRAST CT CERVICAL SPINE WITHOUT CONTRAST TECHNIQUE: Multidetector CT imaging of the head and cervical spine was performed following the standard protocol without intravenous contrast. Multiplanar CT image reconstructions of the cervical spine were also generated. COMPARISON:  None. FINDINGS: CT HEAD FINDINGS Brain: No acute infarct or intracranial hemorrhage. No mass lesion. No midline shift, ventriculomegaly or extra-axial fluid collection. Vascular: No hyperdense vessel or unexpected calcification. Skull: No definite fracture.  No focal lesion. Sinuses/Orbits: Normal orbits. Clear paranasal sinuses. No mastoid effusion. Other: Tiny scattered foci of subcutaneous emphysema within the right face and intraorbital soft tissues. CT CERVICAL SPINE FINDINGS Alignment: Normal. Skull base and vertebrae: No acute fracture. No primary bone lesion or focal pathologic process. Soft tissues and spinal canal: No prevertebral fluid or swelling. No visible canal hematoma. Disc levels: No significant spinal canal or neural foraminal narrowing. Upper chest: Partially imaged bilateral pleural effusions. Dependent atelectasis. Other: None. IMPRESSION: HEAD CT: No acute intracranial process. Tiny scatter right facial and intraorbital foci of subcutaneous emphysema. No definite fracture identified. Consider maxillofacial CT for further evaluation if suspicion persists. CT CERVICAL SPINE : No fracture or traumatic listhesis. Electronically Signed   By: Stana Bunting M.D.   On: 11/23/2019 11:15   CT Cervical Spine Wo Contrast  Result Date: 11/23/2019 CLINICAL DATA:  Trauma EXAM: CT HEAD WITHOUT CONTRAST CT CERVICAL SPINE WITHOUT CONTRAST TECHNIQUE: Multidetector CT  imaging of the head and cervical spine was performed following the standard protocol without intravenous contrast. Multiplanar CT image reconstructions of the cervical spine were also generated. COMPARISON:  None. FINDINGS: CT  HEAD FINDINGS Brain: No acute infarct or intracranial hemorrhage. No mass lesion. No midline shift, ventriculomegaly or extra-axial fluid collection. Vascular: No hyperdense vessel or unexpected calcification. Skull: No definite fracture.  No focal lesion. Sinuses/Orbits: Normal orbits. Clear paranasal sinuses. No mastoid effusion. Other: Tiny scattered foci of subcutaneous emphysema within the right face and intraorbital soft tissues. CT CERVICAL SPINE FINDINGS Alignment: Normal. Skull base and vertebrae: No acute fracture. No primary bone lesion or focal pathologic process. Soft tissues and spinal canal: No prevertebral fluid or swelling. No visible canal hematoma. Disc levels: No significant spinal canal or neural foraminal narrowing. Upper chest: Partially imaged bilateral pleural effusions. Dependent atelectasis. Other: None. IMPRESSION: HEAD CT: No acute intracranial process. Tiny scatter right facial and intraorbital foci of subcutaneous emphysema. No definite fracture identified. Consider maxillofacial CT for further evaluation if suspicion persists. CT CERVICAL SPINE : No fracture or traumatic listhesis. Electronically Signed   By: Stana Bunting M.D.   On: 11/23/2019 11:15   DG Chest Portable 1 View  Result Date: 11/23/2019 CLINICAL DATA:  Fall EXAM: PORTABLE CHEST 1 VIEW COMPARISON:  11/22/2019 FINDINGS: Left AICD remains in place, unchanged. Cardiomegaly. No confluent opacities, effusions or edema. No acute bony abnormality. IMPRESSION: Cardiomegaly.  No active disease. Electronically Signed   By: Charlett Nose M.D.   On: 11/23/2019 10:36   DG Chest Portable 1 View  Result Date: 11/22/2019 CLINICAL DATA:  Near syncope EXAM: PORTABLE CHEST 1 VIEW COMPARISON:  11/13/2003  FINDINGS: Cardiomegaly with left chest single lead pacer defibrillator. Both lungs are clear. The visualized skeletal structures are unremarkable. IMPRESSION: Cardiomegaly without acute abnormality of the lungs in AP portable projection. Electronically Signed   By: Lauralyn Primes M.D.   On: 11/22/2019 11:01    Procedures .Critical Care Performed by: Virgina Norfolk, DO Authorized by: Virgina Norfolk, DO   Critical care provider statement:    Critical care time (minutes):  35   Critical care was necessary to treat or prevent imminent or life-threatening deterioration of the following conditions:  Cardiac failure   Critical care was time spent personally by me on the following activities:  Blood draw for specimens, development of treatment plan with patient or surrogate, discussions with primary provider, evaluation of patient's response to treatment, examination of patient, obtaining history from patient or surrogate, ordering and performing treatments and interventions, ordering and review of radiographic studies, ordering and review of laboratory studies, pulse oximetry, re-evaluation of patient's condition and review of old charts   I assumed direction of critical care for this patient from another provider in my specialty: no     (including critical care time)  Medications Ordered in ED Medications - No data to display  ED Course  I have reviewed the triage vital signs and the nursing notes.  Pertinent labs & imaging results that were available during my care of the patient were reviewed by me and considered in my medical decision making (see chart for details).    MDM Rules/Calculators/A&P                          ZAVIAN SLOWEY is a 59 year old male with history of paroxysmal A. fib on Eliquis, Tikosyn, Ranexa who follows with cardiology who presents the ED after syncopal event. Patient with unremarkable vitals. No fever. Had a syncopal event today at the back of his head. Defibrillator  shocked him. Possibly 2 more episodes with EMS. EMS already has report that he had  3 episodes of V. tach and was shocked. He is overall asymptomatic currently. Does have a small hematoma to the back of his head. EKG here shows sinus bradycardia. Will get head CT, neck CT to evaluate for traumatic injury. Will get lab work including troponin, chest x-ray. Patient has not had any chest pain during this time. Had similar event yesterday and was evaluated in the emergency department. Patient was seen by cardiology and outpatient follow-up was arranged. We will reevaluate the patient with lab work and have cardiology evaluate the patient as well.  Patient with troponin elevation to 63.  Patient with no significant anemia, electrolyte abnormality, kidney injury.  CT head and face unremarkable.  Cardiology will admit and make medication adjustments given V. tach and defibrillation x3.  Asymptomatic throughout my care.  This chart was dictated using voice recognition software.  Despite best efforts to proofread,  errors can occur which can change the documentation meaning.   Final Clinical Impression(s) / ED Diagnoses Final diagnoses:  V-tach [facility])    Rx / DC Orders ED Discharge Orders    None       Virgina Norfolk, DO 11/23/19 1204

## 2019-11-23 NOTE — Telephone Encounter (Signed)
New message:     Patient wife calling to report that her husband had to back to the ER this morning. Please call wife she is having some cocnerns.

## 2019-11-23 NOTE — ED Triage Notes (Signed)
Pt here from work with c/o fall after going into vtach and getting shocked by his device, pt was shocked 3 different times ems gave 150 amiodarone by ems  And 500 of fluids , pt did fall and hit his head

## 2019-11-23 NOTE — Telephone Encounter (Signed)
Spoke with pt's wife, Misty Stanley who reports her husband is back in ED again today after losing consciousness and receiving ICD shocks.  Pt's wife is very concerned that Dr Graciela Husbands is not in the hospital to treat her husband.  Reassured that whoever is on call for the EP team will work together to see that her husband receives the very best care.  Pt's wife asks that RN please let Dr Graciela Husbands know pt is at the hospital.  RN advised wife that I will send a message to Dr Graciela Husbands to notify him of pt's admission to ED.  Pt's wife thanked Charity fundraiser for the call.  Dr Graciela Husbands notified of pt's admission to ED.

## 2019-11-23 NOTE — Telephone Encounter (Signed)
Attempted phone call to pt's wife, Misty Stanley.  Left voicemail message to contact RN at [phone].

## 2019-11-24 ENCOUNTER — Inpatient Hospital Stay (HOSPITAL_COMMUNITY): Payer: BC Managed Care – PPO

## 2019-11-24 DIAGNOSIS — I351 Nonrheumatic aortic (valve) insufficiency: Secondary | ICD-10-CM

## 2019-11-24 DIAGNOSIS — I340 Nonrheumatic mitral (valve) insufficiency: Secondary | ICD-10-CM

## 2019-11-24 DIAGNOSIS — I361 Nonrheumatic tricuspid (valve) insufficiency: Secondary | ICD-10-CM

## 2019-11-24 LAB — BASIC METABOLIC PANEL
Anion gap: 12 (ref 5–15)
BUN: 12 mg/dL (ref 6–20)
CO2: 19 mmol/L — ABNORMAL LOW (ref 22–32)
Calcium: 9.2 mg/dL (ref 8.9–10.3)
Chloride: 106 mmol/L (ref 98–111)
Creatinine, Ser: 1.02 mg/dL (ref 0.61–1.24)
GFR calc Af Amer: >60 mL/min (ref >60)
GFR calc non Af Amer: >60 mL/min (ref >60)
Glucose, Bld: 120 mg/dL — ABNORMAL HIGH (ref 70–99)
Potassium: 4 mmol/L (ref 3.5–5.1)
Sodium: 137 mmol/L (ref 135–145)

## 2019-11-24 LAB — ECHOCARDIOGRAM COMPLETE
Area-P 1/2: 4.39 cm2
Calc EF: 37.8 %
Height: 67 in
MV M vel: 4.31 m/s
MV Peak grad: 74.3 mmHg
P 1/2 time: 607 msec
Radius: 0.75 cm
S' Lateral: 3.5 cm
Single Plane A2C EF: 45.2 %
Single Plane A4C EF: 30.4 %
Weight: 2966.4 oz

## 2019-11-24 LAB — MAGNESIUM
Magnesium: 1.8 mg/dL (ref 1.7–2.4)
Magnesium: 1.8 mg/dL (ref 1.7–2.4)

## 2019-11-24 LAB — TSH: TSH: 3.229 u[IU]/mL (ref 0.350–4.500)

## 2019-11-24 LAB — HIV ANTIBODY (ROUTINE TESTING W REFLEX): HIV Screen 4th Generation wRfx: NONREACTIVE

## 2019-11-24 MED ORDER — PERFLUTREN LIPID MICROSPHERE
1.0000 mL | INTRAVENOUS | Status: AC | PRN
  Administered 2019-11-24: 3.5 mL via INTRAVENOUS
  Filled 2019-11-24: qty 10

## 2019-11-24 NOTE — Progress Notes (Signed)
Electrophysiology Rounding Note  Patient Name: Donald Bond Date of Encounter: 11/24/2019  Primary Cardiologist: No primary care provider on file. Electrophysiologist: Sherryl Manges, MD   Subjective   The patient is feeling OK this am. At this time, the patient denies chest pain, shortness of breath, or any new concerns.  Inpatient Medications    Scheduled Meds: . apixaban  5 mg Oral BID  . atenolol  100 mg Oral QHS  . dofetilide  500 mcg Oral BID  . magnesium oxide  400 mg Oral Daily  . sodium chloride flush  3 mL Intravenous Q12H   Continuous Infusions: . sodium chloride     PRN Meds: sodium chloride, acetaminophen, loratadine, ondansetron (ZOFRAN) IV, sodium chloride flush   Vital Signs    Vitals:   11/23/19 1704 11/23/19 1804 11/23/19 2017 11/24/19 0558  BP: (!) 134/95 129/83 125/83 135/90  Pulse: 64 (!) 59 63 (!) 56  Resp:   18 18  Temp:   98 F (36.7 C) 97.7 F (36.5 C)  TempSrc:   Oral Oral  SpO2: 94% 95% 94% 94%  Weight:    84.1 kg  Height:        Intake/Output Summary (Last 24 hours) at 11/24/2019 0731 Last data filed at 11/24/2019 0206 Gross per 24 hour  Intake --  Output 1000 ml  Net -1000 ml   Filed Weights   11/23/19 1659 11/24/19 0558  Weight: 85.5 kg 84.1 kg    Physical Exam    GEN- The patient is well appearing, alert and oriented x 3 today.   Head- normocephalic, atraumatic Eyes-  Sclera clear, conjunctiva pink Ears- hearing intact Oropharynx- clear Neck- supple Lungs- Clear to ausculation bilaterally, normal work of breathing Heart- Regular rate and rhythm, no murmurs, rubs or gallops GI- soft, NT, ND, + BS Extremities- no clubbing or cyanosis. No edema Skin- no rash or lesion Psych- euthymic mood, full affect Neuro- strength and sensation are intact  Labs    CBC Recent Labs    11/22/19 1041 11/23/19 1025  WBC 7.2 8.0  NEUTROABS 5.6 6.1  HGB 14.6 15.6  HCT 45.6 49.2  MCV 91.0 91.6  PLT 181 207   Basic Metabolic  Panel Recent Labs    11/22/19 1041 11/23/19 1025  NA 140 138  K 4.3 4.6  CL 109 106  CO2 17* 22  GLUCOSE 96 117*  BUN 14 13  CREATININE 1.12 1.08  CALCIUM 9.2 9.0  MG 1.7 2.0   Liver Function Tests Recent Labs    11/23/19 1025  AST 33  ALT 25  ALKPHOS 50  BILITOT 2.7*  PROT 6.5  ALBUMIN 3.7   No results for input(s): LIPASE, AMYLASE in the last 72 hours. Cardiac Enzymes No results for input(s): CKTOTAL, CKMB, CKMBINDEX, TROPONINI in the last 72 hours.   Telemetry    SB/NSR 50-70s, PVCs, occasional short NSVT (personally reviewed)  Radiology    CT Head Wo Contrast  Result Date: 11/23/2019 CLINICAL DATA:  Trauma EXAM: CT HEAD WITHOUT CONTRAST CT CERVICAL SPINE WITHOUT CONTRAST TECHNIQUE: Multidetector CT imaging of the head and cervical spine was performed following the standard protocol without intravenous contrast. Multiplanar CT image reconstructions of the cervical spine were also generated. COMPARISON:  None. FINDINGS: CT HEAD FINDINGS Brain: No acute infarct or intracranial hemorrhage. No mass lesion. No midline shift, ventriculomegaly or extra-axial fluid collection. Vascular: No hyperdense vessel or unexpected calcification. Skull: No definite fracture.  No focal lesion. Sinuses/Orbits: Normal orbits. Clear  paranasal sinuses. No mastoid effusion. Other: Tiny scattered foci of subcutaneous emphysema within the right face and intraorbital soft tissues. CT CERVICAL SPINE FINDINGS Alignment: Normal. Skull base and vertebrae: No acute fracture. No primary bone lesion or focal pathologic process. Soft tissues and spinal canal: No prevertebral fluid or swelling. No visible canal hematoma. Disc levels: No significant spinal canal or neural foraminal narrowing. Upper chest: Partially imaged bilateral pleural effusions. Dependent atelectasis. Other: None. IMPRESSION: HEAD CT: No acute intracranial process. Tiny scatter right facial and intraorbital foci of subcutaneous emphysema.  No definite fracture identified. Consider maxillofacial CT for further evaluation if suspicion persists. CT CERVICAL SPINE : No fracture or traumatic listhesis. Electronically Signed   By: Stana Bunting M.D.   On: 11/23/2019 11:15   CT Cervical Spine Wo Contrast  Result Date: 11/23/2019 CLINICAL DATA:  Trauma EXAM: CT HEAD WITHOUT CONTRAST CT CERVICAL SPINE WITHOUT CONTRAST TECHNIQUE: Multidetector CT imaging of the head and cervical spine was performed following the standard protocol without intravenous contrast. Multiplanar CT image reconstructions of the cervical spine were also generated. COMPARISON:  None. FINDINGS: CT HEAD FINDINGS Brain: No acute infarct or intracranial hemorrhage. No mass lesion. No midline shift, ventriculomegaly or extra-axial fluid collection. Vascular: No hyperdense vessel or unexpected calcification. Skull: No definite fracture.  No focal lesion. Sinuses/Orbits: Normal orbits. Clear paranasal sinuses. No mastoid effusion. Other: Tiny scattered foci of subcutaneous emphysema within the right face and intraorbital soft tissues. CT CERVICAL SPINE FINDINGS Alignment: Normal. Skull base and vertebrae: No acute fracture. No primary bone lesion or focal pathologic process. Soft tissues and spinal canal: No prevertebral fluid or swelling. No visible canal hematoma. Disc levels: No significant spinal canal or neural foraminal narrowing. Upper chest: Partially imaged bilateral pleural effusions. Dependent atelectasis. Other: None. IMPRESSION: HEAD CT: No acute intracranial process. Tiny scatter right facial and intraorbital foci of subcutaneous emphysema. No definite fracture identified. Consider maxillofacial CT for further evaluation if suspicion persists. CT CERVICAL SPINE : No fracture or traumatic listhesis. Electronically Signed   By: Stana Bunting M.D.   On: 11/23/2019 11:15   DG Chest Portable 1 View  Result Date: 11/23/2019 CLINICAL DATA:  Fall EXAM: PORTABLE CHEST 1  VIEW COMPARISON:  11/22/2019 FINDINGS: Left AICD remains in place, unchanged. Cardiomegaly. No confluent opacities, effusions or edema. No acute bony abnormality. IMPRESSION: Cardiomegaly.  No active disease. Electronically Signed   By: Charlett Nose M.D.   On: 11/23/2019 10:36   DG Chest Portable 1 View  Result Date: 11/22/2019 CLINICAL DATA:  Near syncope EXAM: PORTABLE CHEST 1 VIEW COMPARISON:  11/13/2003 FINDINGS: Cardiomegaly with left chest single lead pacer defibrillator. Both lungs are clear. The visualized skeletal structures are unremarkable. IMPRESSION: Cardiomegaly without acute abnormality of the lungs in AP portable projection. Electronically Signed   By: Lauralyn Primes M.D.   On: 11/22/2019 11:01    Patient Profile     Samaj Wessells Holderis a 59 y.o.malewith a history of HCM associated with syncope and previously treated VT, persistent AF, Chronic anticoag with Eliquis, LBBB, and sleep disordered breathingwho is being seen today for the evaluation ofICD shockat the request of Dr. Lockie Mola.  Assessment & Plan    1.VF with appropriate therapy in HCM Pt with syncopal episode and ICD shock 9/22 and 9/23, and again today.  K and Mg pending this am.  Echo 05/2016 showed severe focal basal and moderate concentric hypertrophy. No evidence of anterior MV or chordal SAM and no LVOT outflow gradient noted at that  time. Normal EF at that time. Will repeat today.  QTc appears to be lengthened now. Will follow closely by EKG to consider down titration of tikosyn.  HS troponin mildly elevated in setting of ICD shock. VT programming adjusted to be more aggressive.   2. LBBB/1AVB QRS ~150, chronically.  3. Persistent atrial fibrillation On tikosyn and recently started on ranolazine.  He remains in NSR on presentation today. He feels like he converted to NSR over the weekend on ranolazine.  Follow daily EKGs to see if QTc is going to be too long to continue current dose.   4.  Prolonged/Borderline QT ~500-520 today. Will check EKG to follow tikosyn dosing.   For questions or updates, please contact [facility] Please consult [email or web address] for contact info under Cardiology/STEMI.  Signed, Graciella Freer, PA-C  11/24/2019, 7:31 AM

## 2019-11-24 NOTE — TOC Initial Note (Signed)
Transition of Care [facility]) - Initial/Assessment Note    Patient Details  Name: Donald Bond MRN: [id number] Date of Birth: 1960-05-25  Transition of Care [facility]) CM/SW Contact:    Nance Pear, RN Phone Number: 11/24/2019, 1:21 PM  Clinical Narrative:                 Case manager received consult for health assessment.  Case manager spoke to patient in room; denies issues with obtaining, taking and affording medications, denies issues with transportation to provider visits, understands discharge follow up with providers, lives with wife and children, says has good family support.    Expected Discharge Plan: Home/Self Care Barriers to Discharge: No Barriers Identified   Patient Goals and CMS Choice Patient states their goals for this hospitalization and ongoing recovery are:: to go home.      Expected Discharge Plan and Services Expected Discharge Plan: Home/Self Care   Discharge Planning Services: CM Consult   Living arrangements for the past 2 months: Single Family Home                  Prior Living Arrangements/Services Living arrangements for the past 2 months: Single Family Home Lives with:: Spouse, Minor Children          Need for Family Participation in Patient Care: Yes (Comment) Care giver support system in place?: Yes (comment)   Criminal Activity/Legal Involvement Pertinent to Current Situation/Hospitalization: No - Comment as needed   Permission Sought/Granted Permission sought to share information with : Case Manager Permission granted to share information with : Yes, Verbal Permission Granted    Emotional Assessment Appearance:: Appears stated age Attitude/Demeanor/Rapport: Engaged Affect (typically observed): Appropriate Orientation: : Oriented to Self, Oriented to Place, Oriented to  Time, Oriented to Situation Alcohol / Substance Use: Not Applicable Psych Involvement: No (comment)  Admission diagnosis:  VT (ventricular tachycardia) (HCC)  [I47.2] V-tach [facility]) [I47.2] Patient Active Problem List   Diagnosis Date Noted  . VT (ventricular tachycardia) (HCC) 11/23/2019  . Persistent atrial fibrillation (HCC)   . Palpitations 07/03/2019  . PAF (paroxysmal atrial fibrillation) (HCC)   . Visit for monitoring Tikosyn therapy 06/15/2017  . Hypertension 11/04/2016  . Ventricular fibrillation/polymorphic ventricular tachycardia 06/04/2011  . SYNCOPE 10/22/2009  . TIREDNESS 10/22/2009  . CHEST PAIN UNSPECIFIED 03/05/2009  . Hypertrophic cardiomyopathy (HCC) 06/05/2008  . Implantable cardioverter-defibrillator (ICD) in situ 06/05/2008   PCP:  Medicine, [facility]:   [facility] - [place], [place] - [address] [place] [address] Phone: [phone] Fax: [phone]  [facility] [phone] - [place], [place] - [address] [place]  [place] [address] Phone: [phone] Fax: [phone]   Social Determinants of Health (SDOH) Interventions    Readmission Risk Interventions No flowsheet data found.

## 2019-11-24 NOTE — Progress Notes (Signed)
  Echocardiogram 2D Echocardiogram has been performed.  Augustine Radar 11/24/2019, 12:52 PM

## 2019-11-25 ENCOUNTER — Other Ambulatory Visit: Payer: Self-pay

## 2019-11-25 LAB — BASIC METABOLIC PANEL
Anion gap: 10 (ref 5–15)
BUN: 11 mg/dL (ref 6–20)
CO2: 24 mmol/L (ref 22–32)
Calcium: 9.2 mg/dL (ref 8.9–10.3)
Chloride: 106 mmol/L (ref 98–111)
Creatinine, Ser: 1 mg/dL (ref 0.61–1.24)
GFR calc Af Amer: >60 mL/min (ref >60)
GFR calc non Af Amer: >60 mL/min (ref >60)
Glucose, Bld: 87 mg/dL (ref 70–99)
Potassium: 3.8 mmol/L (ref 3.5–5.1)
Sodium: 140 mmol/L (ref 135–145)

## 2019-11-25 LAB — MAGNESIUM: Magnesium: 1.9 mg/dL (ref 1.7–2.4)

## 2019-11-25 MED ORDER — POTASSIUM CHLORIDE CRYS ER 20 MEQ PO TBCR
40.0000 meq | EXTENDED_RELEASE_TABLET | Freq: Once | ORAL | Status: AC
  Administered 2019-11-25: 40 meq via ORAL
  Filled 2019-11-25: qty 2

## 2019-11-25 NOTE — Progress Notes (Signed)
Progress Note  Patient Name: Donald Bond Date of Encounter: 11/25/2019  Primary Cardiologist: No primary care provider on file.   Subjective   No chest pain or sob. No palpitations.  Inpatient Medications    Scheduled Meds: . apixaban  5 mg Oral BID  . atenolol  100 mg Oral QHS  . dofetilide  500 mcg Oral BID  . magnesium oxide  400 mg Oral Daily  . sodium chloride flush  3 mL Intravenous Q12H   Continuous Infusions: . sodium chloride     PRN Meds: sodium chloride, acetaminophen, loratadine, ondansetron (ZOFRAN) IV, sodium chloride flush   Vital Signs    Vitals:   11/24/19 2052 11/25/19 0003 11/25/19 0526 11/25/19 0744  BP: (!) 142/92 140/88 138/68 130/89  Pulse: 71   (!) 56  Resp: Temp: 98.1 F (36.7 C) 98 F (36.7 C) 98 F (36.7 C) 98 F (36.7 C)  TempSrc: Oral Oral Oral Oral  SpO2: 96%   97%  Weight:   84 kg   Height:       No intake or output data in the 24 hours ending 11/25/19 0947 Filed Weights   11/23/19 1659 11/24/19 0558 11/25/19 0526  Weight: 85.5 kg 84.1 kg 84 kg    Telemetry    nsr - Personally Reviewed  ECG    none - Personally Reviewed  Physical Exam   GEN: No acute distress.   Neck: No JVD Cardiac: RRR, no murmurs, rubs, or gallops.  Respiratory: Clear to auscultation bilaterally. GI: Soft, nontender, non-distended  MS: No edema; No deformity. Neuro:  Nonfocal  Psych: Normal affect   Labs    Chemistry Recent Labs  Lab 11/23/19 1025 11/24/19 0806 11/25/19 0754  NA 138 137 140  K 4.6 4.0 3.8  CL 106 106 106  CO2 22 19* 24  GLUCOSE 117* 120* 87  BUN CREATININE 1.08 1.02 1.00  CALCIUM 9.0 9.2 9.2  PROT 6.5  --   --   ALBUMIN 3.7  --   --   AST 33  --   --   ALT 25  --   --   ALKPHOS 50  --   --   BILITOT 2.7*  --   --   GFRNONAA >60 >60 >60  GFRAA >60 >60 >60  ANIONGAP Hematology Recent Labs  Lab 11/20/19 1254 11/22/19 1041 11/23/19 1025  WBC 7.6 7.2 8.0  RBC 5.23  5.01 5.37  HGB 15.6 14.6 15.6  HCT 46.5 45.6 49.2  MCV 89 91.0 91.6  MCH 29.8 29.1 29.1  MCHC 33.5 32.0 31.7  RDW 13.0 13.6 13.9  PLT 175 181 207    Cardiac EnzymesNo results for input(s): TROPONINI in the last 168 hours. No results for input(s): TROPIPOC in the last 168 hours.   BNPNo results for input(s): BNP, PROBNP in the last 168 hours.   DDimer No results for input(s): DDIMER in the last 168 hours.   Radiology    CT Head Wo Contrast  Result Date: 11/23/2019 CLINICAL DATA:  Trauma EXAM: CT HEAD WITHOUT CONTRAST CT CERVICAL SPINE WITHOUT CONTRAST TECHNIQUE: Multidetector CT imaging of the head and cervical spine was performed following the standard protocol without intravenous contrast. Multiplanar CT image reconstructions of the cervical spine were also generated. COMPARISON:  None. FINDINGS: CT HEAD FINDINGS Brain: No acute infarct or intracranial hemorrhage. No mass lesion. No midline shift, ventriculomegaly  or extra-axial fluid collection. Vascular: No hyperdense vessel or unexpected calcification. Skull: No definite fracture.  No focal lesion. Sinuses/Orbits: Normal orbits. Clear paranasal sinuses. No mastoid effusion. Other: Tiny scattered foci of subcutaneous emphysema within the right face and intraorbital soft tissues. CT CERVICAL SPINE FINDINGS Alignment: Normal. Skull base and vertebrae: No acute fracture. No primary bone lesion or focal pathologic process. Soft tissues and spinal canal: No prevertebral fluid or swelling. No visible canal hematoma. Disc levels: No significant spinal canal or neural foraminal narrowing. Upper chest: Partially imaged bilateral pleural effusions. Dependent atelectasis. Other: None. IMPRESSION: HEAD CT: No acute intracranial process. Tiny scatter right facial and intraorbital foci of subcutaneous emphysema. No definite fracture identified. Consider maxillofacial CT for further evaluation if suspicion persists. CT CERVICAL SPINE : No fracture or  traumatic listhesis. Electronically Signed   By: Stana Bunting M.D.   On: 11/23/2019 11:15   CT Cervical Spine Wo Contrast  Result Date: 11/23/2019 CLINICAL DATA:  Trauma EXAM: CT HEAD WITHOUT CONTRAST CT CERVICAL SPINE WITHOUT CONTRAST TECHNIQUE: Multidetector CT imaging of the head and cervical spine was performed following the standard protocol without intravenous contrast. Multiplanar CT image reconstructions of the cervical spine were also generated. COMPARISON:  None. FINDINGS: CT HEAD FINDINGS Brain: No acute infarct or intracranial hemorrhage. No mass lesion. No midline shift, ventriculomegaly or extra-axial fluid collection. Vascular: No hyperdense vessel or unexpected calcification. Skull: No definite fracture.  No focal lesion. Sinuses/Orbits: Normal orbits. Clear paranasal sinuses. No mastoid effusion. Other: Tiny scattered foci of subcutaneous emphysema within the right face and intraorbital soft tissues. CT CERVICAL SPINE FINDINGS Alignment: Normal. Skull base and vertebrae: No acute fracture. No primary bone lesion or focal pathologic process. Soft tissues and spinal canal: No prevertebral fluid or swelling. No visible canal hematoma. Disc levels: No significant spinal canal or neural foraminal narrowing. Upper chest: Partially imaged bilateral pleural effusions. Dependent atelectasis. Other: None. IMPRESSION: HEAD CT: No acute intracranial process. Tiny scatter right facial and intraorbital foci of subcutaneous emphysema. No definite fracture identified. Consider maxillofacial CT for further evaluation if suspicion persists. CT CERVICAL SPINE : No fracture or traumatic listhesis. Electronically Signed   By: Stana Bunting M.D.   On: 11/23/2019 11:15   DG Chest Portable 1 View  Result Date: 11/23/2019 CLINICAL DATA:  Fall EXAM: PORTABLE CHEST 1 VIEW COMPARISON:  11/22/2019 FINDINGS: Left AICD remains in place, unchanged. Cardiomegaly. No confluent opacities, effusions or edema. No  acute bony abnormality. IMPRESSION: Cardiomegaly.  No active disease. Electronically Signed   By: Charlett Nose M.D.   On: 11/23/2019 10:36   ECHOCARDIOGRAM COMPLETE  Result Date: 11/24/2019    ECHOCARDIOGRAM REPORT   Patient Name:   Donald Bond Date of Exam: 11/24/2019 Medical Rec #:  [id number]     Height:       67.0 in Accession #:    [id number]    Weight:       185.4 lb Date of Birth:  [birth year]    BSA:          1.958 m Patient Age:    58 years      BP:           135/90 mmHg Patient Gender: M             HR:           55 bpm. Exam Location:  Inpatient Procedure: 2D Echo, Cardiac Doppler, Color Doppler and Intracardiac  Opacification Agent Indications:    Ventricular Tachycardia I47.2  History:        Patient has prior history of Echocardiogram examinations, most                 recent 06/16/2016. Cardiomyopathy; Signs/Symptoms:Syncope and                 Dizziness/Lightheadedness.  Sonographer:    Eulah Pont RDCS Referring Phys: [id number] MICHAEL ANDREW TILLERY IMPRESSIONS  1. Severe asymmetic hypertrophy of the basal septum up to 1.8 cm. Findings consistent with patient's history of hypertrophic cardiomyopathy, sigmoid variant. No LVOT obstruction or mitral SAM. Left ventricular ejection fraction, by estimation, is 25 to 30%. The left ventricle has severely decreased function. The left ventricle demonstrates global hypokinesis. There is severe asymmetric left ventricular hypertrophy of the basal-septal segment. Left ventricular diastolic function could not be evaluated.  2. Right ventricular systolic function is mildly reduced. The right ventricular size is mildly enlarged. There is moderately elevated pulmonary artery systolic pressure. The estimated right ventricular systolic pressure is 60.1 mmHg.  3. Left atrial size was severely dilated.  4. Right atrial size was moderately dilated.  5. Severe mitral regurgitation due to restricted PMVL (IIIB). 2D ERO 0.37 cm2, with R vol 57, RF 58%. The  mitral valve is degenerative. Severe mitral valve regurgitation. No evidence of mitral stenosis.  6. Tricuspid valve regurgitation is moderate to severe.  7. The aortic valve is tricuspid. There is mild thickening of the aortic valve. Aortic valve regurgitation is mild. No aortic stenosis is present.  8. The inferior vena cava is dilated in size with >50% respiratory variability, suggesting right atrial pressure of 8 mmHg. Comparison(s): Changes from prior study are noted. EF ~25-30%. Severe MR. Moderate to Severe TR. HCM. FINDINGS  Left Ventricle: Severe asymmetic hypertrophy of the basal septum up to 1.8 cm. Findings consistent with patient's history of hypertrophic cardiomyopathy, sigmoid variant. No LVOT obstruction or mitral SAM. Left ventricular ejection fraction, by estimation, is 25 to 30%. The left ventricle has severely decreased function. The left ventricle demonstrates global hypokinesis. Definity contrast agent was given IV to delineate the left ventricular endocardial borders. The left ventricular internal cavity size was normal in size. There is severe asymmetric left ventricular hypertrophy of the basal-septal segment. Abnormal (paradoxical) septal motion, consistent with left bundle branch block. Left ventricular diastolic function could not be evaluated due to mitral regurgitation (moderate or greater). Left ventricular diastolic function could not be evaluated. Right Ventricle: The right ventricular size is mildly enlarged. No increase in right ventricular wall thickness. Right ventricular systolic function is mildly reduced. There is moderately elevated pulmonary artery systolic pressure. The tricuspid regurgitant velocity is 3.61 m/s, and with an assumed right atrial pressure of 8 mmHg, the estimated right ventricular systolic pressure is 60.1 mmHg. Left Atrium: Left atrial size was severely dilated. Right Atrium: Right atrial size was moderately dilated. Pericardium: Trivial pericardial  effusion is present. Mitral Valve: Severe mitral regurgitation due to restricted PMVL (IIIB). 2D ERO 0.37 cm2, with R vol 57, RF 58%. The mitral valve is degenerative in appearance. Mild mitral annular calcification. Severe mitral valve regurgitation, with centrally-directed  jet. No evidence of mitral valve stenosis. Tricuspid Valve: The tricuspid valve is grossly normal. Tricuspid valve regurgitation is moderate to severe. No evidence of tricuspid stenosis. Aortic Valve: The aortic valve is tricuspid. There is mild thickening of the aortic valve. Aortic valve regurgitation is mild. Aortic regurgitation PHT measures 607 msec. No aortic stenosis  is present. Pulmonic Valve: The pulmonic valve was grossly normal. Pulmonic valve regurgitation is trivial. No evidence of pulmonic stenosis. Aorta: The aortic root and ascending aorta are structurally normal, with no evidence of dilitation. Venous: The inferior vena cava is dilated in size with greater than 50% respiratory variability, suggesting right atrial pressure of 8 mmHg. IAS/Shunts: The atrial septum is grossly normal. Additional Comments: A pacer wire is visualized in the right atrium and right ventricle.  LEFT VENTRICLE PLAX 2D LVIDd:         4.90 cm      Diastology LVIDs:         3.50 cm      LV e' medial:    4.67 cm/s LV PW:         1.40 cm      LV E/e' medial:  23.3 LV IVS:        1.30 cm      LV e' lateral:   6.92 cm/s LVOT diam:     2.10 cm      LV E/e' lateral: 15.8 LV SV:         41 LV SV Index:   21 LVOT Area:     3.46 cm  LV Volumes (MOD) LV vol d, MOD A2C: 166.0 ml LV vol d, MOD A4C: 168.0 ml LV vol s, MOD A2C: 90.9 ml LV vol s, MOD A4C: 117.0 ml LV SV MOD A2C:     75.1 ml LV SV MOD A4C:     168.0 ml LV SV MOD BP:      63.0 ml RIGHT VENTRICLE RV S prime:     11.20 cm/s TAPSE (M-mode): 1.8 cm LEFT ATRIUM              Index       RIGHT ATRIUM           Index LA diam:        5.80 cm  2.96 cm/m  RA Area:     27.80 cm LA Vol (A2C):   135.0 ml 68.94 ml/m  RA Volume:   88.10 ml  44.99 ml/m LA Vol (A4C):   193.0 ml 98.56 ml/m LA Biplane Vol: 175.0 ml 89.37 ml/m  AORTIC VALVE LVOT Vmax:   69.60 cm/s LVOT Vmean:  51.100 cm/s LVOT VTI:    0.118 m AI PHT:      607 msec  AORTA Ao Root diam: 3.00 cm Ao Asc diam:  3.20 cm MITRAL VALVE                 TRICUSPID VALVE MV Area (PHT): 4.39 cm      TR Peak grad:   52.1 mmHg MV Decel Time: 173 msec      TR Vmax:        361.00 cm/s MR Peak grad:    74.3 mmHg MR Mean grad:    46.0 mmHg   SHUNTS MR Vmax:         431.00 cm/s Systemic VTI:  0.12 m MR Vmean:        314.0 cm/s  Systemic Diam: 2.10 cm MR PISA:         3.53 cm MR PISA Eff ROA: 33 mm MR PISA Radius:  0.75 cm MV E velocity: 109.00 cm/s MV A velocity: 51.00 cm/s MV E/A ratio:  2.14 Lennie Odor MD Electronically signed by Lennie Odor MD Signature Date/Time: 11/24/2019/3:00:17 PM    Final     Cardiac Studies   MRI  noted  Patient Profile     59 y.o. male admitted with ICD discharges due to VT and syncope along with persistent atrial fib.   Assessment & Plan    1. VT/VF - he is maintaining NSR with no additional ventricular arrhythmias. Continue dofetilide. 2. Persistent atrial fib - he is maintaining NSR.  3. Prolonged QT - his QT interval is increased a bit. We will continue dofetilide as he has an ICD in place. 4. Disp. - dc home tomorrow if stable.  For questions or updates, please contact [facility] Please consult [email or web address] for contact info under Cardiology/STEMI.      Signed, Lewayne Bunting, MD  11/25/2019, 9:47 AM  Patient ID: Donald Bond, male   DOB: 1960-03-09, 59 y.o.   MRN: [id number]

## 2019-11-26 ENCOUNTER — Other Ambulatory Visit: Payer: Self-pay

## 2019-11-26 ENCOUNTER — Encounter (HOSPITAL_COMMUNITY): Payer: Self-pay | Admitting: Internal Medicine

## 2019-11-26 DIAGNOSIS — I429 Cardiomyopathy, unspecified: Secondary | ICD-10-CM

## 2019-11-26 DIAGNOSIS — I340 Nonrheumatic mitral (valve) insufficiency: Secondary | ICD-10-CM

## 2019-11-26 HISTORY — DX: Nonrheumatic mitral (valve) insufficiency: I34.0

## 2019-11-26 LAB — BASIC METABOLIC PANEL
Anion gap: 8 (ref 5–15)
BUN: 13 mg/dL (ref 6–20)
CO2: 25 mmol/L (ref 22–32)
Calcium: 9.1 mg/dL (ref 8.9–10.3)
Chloride: 107 mmol/L (ref 98–111)
Creatinine, Ser: 1.07 mg/dL (ref 0.61–1.24)
GFR calc Af Amer: >60 mL/min (ref >60)
GFR calc non Af Amer: >60 mL/min (ref >60)
Glucose, Bld: 91 mg/dL (ref 70–99)
Potassium: 5 mmol/L (ref 3.5–5.1)
Sodium: 140 mmol/L (ref 135–145)

## 2019-11-26 LAB — MAGNESIUM: Magnesium: 2 mg/dL (ref 1.7–2.4)

## 2019-11-26 MED ORDER — BENAZEPRIL HCL 5 MG PO TABS: 2.5000 mg | ORAL_TABLET | Freq: Every day | ORAL | 6 refills | Status: DC

## 2019-11-26 MED ORDER — ATENOLOL 100 MG PO TABS: 100.0000 mg | ORAL_TABLET | Freq: Every day | ORAL | Status: DC

## 2019-11-26 MED ORDER — BENAZEPRIL HCL 5 MG PO TABS
2.5000 mg | ORAL_TABLET | Freq: Every day | ORAL | Status: DC
  Filled 2019-11-26: qty 1

## 2019-11-26 NOTE — Discharge Summary (Addendum)
Discharge Summary    Patient ID: Donald Bond MRN: [id number]; DOB: 09-05-1960  Admit date: 11/23/2019 Discharge date: 11/26/2019  Primary Care Provider: Medicine, [facility]  Primary Cardiologist: No primary care provider on file.  Primary Electrophysiologist:  Sherryl Manges, MD   Discharge Diagnoses    Principal Problem:   VT (ventricular tachycardia) (HCC) Active Problems:   Hypertrophic cardiomyopathy (HCC)   SYNCOPE   Implantable cardioverter-defibrillator (ICD) in situ   Persistent atrial fibrillation (HCC)   Cardiomyopathy (HCC) EF 25-30%    Severe mitral valve regurgitation by Echo 11/24/19    Diagnostic Studies/Procedures    Echo 11/24/19 IMPRESSIONS     1. Severe asymmetic hypertrophy of the basal septum up to 1.8 cm.  Findings consistent with patient's history of hypertrophic cardiomyopathy,  sigmoid variant. No LVOT obstruction or mitral SAM. Left ventricular  ejection fraction, by estimation, is 25 to  30%.  The left ventricle has severely decreased function. The left ventricle demonstrates global hypokinesis. There is severe asymmetric left ventricular hypertrophy of the basal-septal segment. Left ventricular diastolic function could not be evaluated.   2. Right ventricular systolic function is mildly reduced. The right  ventricular size is mildly enlarged. There is moderately elevated  pulmonary artery systolic pressure. The estimated right ventricular  systolic pressure is 60.1 mmHg.   3. Left atrial size was severely dilated.   4. Right atrial size was moderately dilated.   5. Severe mitral regurgitation due to restricted PMVL (IIIB). 2D ERO 0.37  cm2, with R vol 57, RF 58%. The mitral valve is degenerative. Severe  mitral valve regurgitation. No evidence of mitral stenosis.   6. Tricuspid valve regurgitation is moderate to severe.   7. The aortic valve is tricuspid. There is mild thickening of the aortic  valve. Aortic valve  regurgitation is mild. No aortic stenosis is present.   8. The inferior vena cava is dilated in size with >50% respiratory  variability, suggesting right atrial pressure of 8 mmHg.   Comparison(s): Changes from prior study are noted. EF ~25-30%. Severe MR.  Moderate to Severe TR. HCM.   FINDINGS   Left Ventricle: Severe asymmetic hypertrophy of the basal septum up to  1.8 cm. Findings consistent with patient's history of hypertrophic  cardiomyopathy, sigmoid variant. No LVOT obstruction or mitral SAM. Left  ventricular ejection fraction, by  estimation, is 25 to 30%. The left ventricle has severely decreased  function. The left ventricle demonstrates global hypokinesis. Definity  contrast agent was given IV to delineate the left ventricular endocardial  borders. The left ventricular internal  cavity size was normal in size. There is severe asymmetric left  ventricular hypertrophy of the basal-septal segment. Abnormal  (paradoxical) septal motion, consistent with left bundle branch block.  Left ventricular diastolic function could not be  evaluated due to mitral regurgitation (moderate or greater). Left  ventricular diastolic function could not be evaluated.   Right Ventricle: The right ventricular size is mildly enlarged. No  increase in right ventricular wall thickness. Right ventricular systolic  function is mildly reduced. There is moderately elevated pulmonary artery  systolic pressure. The tricuspid  regurgitant velocity is 3.61 m/s, and with an assumed right atrial  pressure of 8 mmHg, the estimated right ventricular systolic pressure is  60.1 mmHg.   Left Atrium: Left atrial size was severely dilated.   Right Atrium: Right atrial size was moderately dilated.   Pericardium: Trivial pericardial effusion is present.   Mitral Valve: Severe mitral regurgitation due  to restricted PMVL (IIIB).  2D ERO 0.37 cm2, with R vol 57, RF 58%. The mitral valve is degenerative  in  appearance. Mild mitral annular calcification. Severe mitral valve  regurgitation, with centrally-directed   jet. No evidence of mitral valve stenosis.   Tricuspid Valve: The tricuspid valve is grossly normal. Tricuspid valve  regurgitation is moderate to severe. No evidence of tricuspid stenosis.   Aortic Valve: The aortic valve is tricuspid. There is mild thickening of  the aortic valve. Aortic valve regurgitation is mild. Aortic regurgitation  PHT measures 607 msec. No aortic stenosis is present.   Pulmonic Valve: The pulmonic valve was grossly normal. Pulmonic valve  regurgitation is trivial. No evidence of pulmonic stenosis.   Aorta: The aortic root and ascending aorta are structurally normal, with  no evidence of dilitation.   Venous: The inferior vena cava is dilated in size with greater than 50%  respiratory variability, suggesting right atrial pressure of 8 mmHg.   IAS/Shunts: The atrial septum is grossly normal.   Additional Comments: A pacer wire is visualized in the right atrium and  right ventricle.  _____________   History of Present Illness     Donald Bond is a 59 y.o. male with hx of HCM associated with syncope and previously treated VT, persistent a fib. chronic anticoagulation with Eliquis, LBBB and sleep disorder was seen in ER 11/22/19  presenting with ICD shock.  His symptoms were driving had sudden onset of tunnel vision, lightheadedness and near syncope.  He and wife felt he lost consciousness briefly.  Wife was able to control car and EMS called.  Pt felt poorly.  ICD interrogation with ICD shock for VF.  It was not felt this was related to tikosyn and ranolazine.   He had converted a couple of days ago per interrogation.  He was discharged.    11/23/19 pt presented to ER after shock X 2 - given IV amiodarone by EMS and 500 cc NS.  Pt did not feel any of the shocks.     Device interrogation he appears to have only received 1 ATP and 1 shock, and otherwise  converted on his own.    CT head without acute intracranial process.  He did have tiny scatter right facial and intraorbital foci of subcutaneous emphysema with no definite fracture. CXR WNL. Hgb 15.6,  K 4.6, Cr 1.08, Mg 2.0, HS-Trop 63.    His ECG shows a QTc that is just above once corrected for the wide QRS.   Will need to monitor this after the ranolazine has washed out. Possibly will need to decrease tikosyn dose.  He was admitted for and ranolazine held.    Hospital Course     Consultants: none   Pt has remained in SR on tele.  He has not had additional ventricular arrhythmias. Continue dofetilide.    Echo was done with severe asymmetric hypertrophy of the basal septum up to 1.8 cm.  No LVOT obstruction or mitral SAM. Left ventricular  ejection fraction, by estimation, is 25 to  30%. The left ventricle has severely decreased function. The left ventricle demonstrates global hypokinesis. There is severe asymmetric left ventricular hypertrophy of the basal-septal segment. Left ventricular diastolic function could not be evaluated.  Has severe MR due to restricted PMVL, no MS,  TR is moderate to severe.  New changes from 2018.  Discussed with Dr. Ladona Ridgel and Dr. Graciela Husbands will address as outpt.  The ranolazine was stopped.  He has  continues on dofetilide.  He has remained in SR.  QT is increased a bit.  No change in dofetilide.  Continue anticoagulation.  No signs of CHF.  Dr. Graciela Husbands to adjust meds for cardiomyopathy as outpt.  Pt seen and evaluated by Dr. Ladona Ridgel and found stable for discharge.   Did the patient have an acute coronary syndrome (MI, NSTEMI, STEMI, etc) this admission?:  No                               Did the patient have a percutaneous coronary intervention (stent / angioplasty)?:  No.   _____________  Discharge Vitals Blood pressure 125/88, pulse 61, temperature 98 F (36.7 C), temperature source Oral, resp. rate 19, height 5\' 7"  (1.702 m), weight 84.2 kg, SpO2 96 %.    Filed Weights   11/24/19 0558 11/25/19 0526 11/26/19 0522  Weight: 84.1 kg 84 kg 84.2 kg    Labs & Radiologic Studies    CBC No results for input(s): WBC, NEUTROABS, HGB, HCT, MCV, PLT in the last 72 hours. Basic Metabolic Panel Recent Labs    11/28/19 0754 11/26/19 0412  NA 140 140  K 3.8 5.0  CL 106 107  CO2 24 25  GLUCOSE 87 91  BUN 11 13  CREATININE 1.00 1.07  CALCIUM 9.2 9.1  MG 1.9 2.0   Liver Function Tests No results for input(s): AST, ALT, ALKPHOS, BILITOT, PROT, ALBUMIN in the last 72 hours. No results for input(s): LIPASE, AMYLASE in the last 72 hours. High Sensitivity Troponin:   Recent Labs  Lab 11/22/19 1041 11/23/19 1025  TROPONINIHS 28* 63*    BNP Invalid input(s): POCBNP D-Dimer No results for input(s): DDIMER in the last 72 hours. Hemoglobin A1C No results for input(s): HGBA1C in the last 72 hours. Fasting Lipid Panel No results for input(s): CHOL, HDL, LDLCALC, TRIG, CHOLHDL, LDLDIRECT in the last 72 hours. Thyroid Function Tests Recent Labs    11/24/19 0806  TSH 3.229   _____________  CT Head Wo Contrast  Result Date: 11/23/2019 CLINICAL DATA:  Trauma EXAM: CT HEAD WITHOUT CONTRAST CT CERVICAL SPINE WITHOUT CONTRAST TECHNIQUE: Multidetector CT imaging of the head and cervical spine was performed following the standard protocol without intravenous contrast. Multiplanar CT image reconstructions of the cervical spine were also generated. COMPARISON:  None. FINDINGS: CT HEAD FINDINGS Brain: No acute infarct or intracranial hemorrhage. No mass lesion. No midline shift, ventriculomegaly or extra-axial fluid collection. Vascular: No hyperdense vessel or unexpected calcification. Skull: No definite fracture.  No focal lesion. Sinuses/Orbits: Normal orbits. Clear paranasal sinuses. No mastoid effusion. Other: Tiny scattered foci of subcutaneous emphysema within the right face and intraorbital soft tissues. CT CERVICAL SPINE FINDINGS Alignment: Normal.  Skull base and vertebrae: No acute fracture. No primary bone lesion or focal pathologic process. Soft tissues and spinal canal: No prevertebral fluid or swelling. No visible canal hematoma. Disc levels: No significant spinal canal or neural foraminal narrowing. Upper chest: Partially imaged bilateral pleural effusions. Dependent atelectasis. Other: None. IMPRESSION: HEAD CT: No acute intracranial process. Tiny scatter right facial and intraorbital foci of subcutaneous emphysema. No definite fracture identified. Consider maxillofacial CT for further evaluation if suspicion persists. CT CERVICAL SPINE : No fracture or traumatic listhesis. Electronically Signed   By: 11/25/2019 M.D.   On: 11/23/2019 11:15   CT Cervical Spine Wo Contrast  Result Date: 11/23/2019 CLINICAL DATA:  Trauma EXAM: CT HEAD WITHOUT CONTRAST CT  CERVICAL SPINE WITHOUT CONTRAST TECHNIQUE: Multidetector CT imaging of the head and cervical spine was performed following the standard protocol without intravenous contrast. Multiplanar CT image reconstructions of the cervical spine were also generated. COMPARISON:  None. FINDINGS: CT HEAD FINDINGS Brain: No acute infarct or intracranial hemorrhage. No mass lesion. No midline shift, ventriculomegaly or extra-axial fluid collection. Vascular: No hyperdense vessel or unexpected calcification. Skull: No definite fracture.  No focal lesion. Sinuses/Orbits: Normal orbits. Clear paranasal sinuses. No mastoid effusion. Other: Tiny scattered foci of subcutaneous emphysema within the right face and intraorbital soft tissues. CT CERVICAL SPINE FINDINGS Alignment: Normal. Skull base and vertebrae: No acute fracture. No primary bone lesion or focal pathologic process. Soft tissues and spinal canal: No prevertebral fluid or swelling. No visible canal hematoma. Disc levels: No significant spinal canal or neural foraminal narrowing. Upper chest: Partially imaged bilateral pleural effusions. Dependent  atelectasis. Other: None. IMPRESSION: HEAD CT: No acute intracranial process. Tiny scatter right facial and intraorbital foci of subcutaneous emphysema. No definite fracture identified. Consider maxillofacial CT for further evaluation if suspicion persists. CT CERVICAL SPINE : No fracture or traumatic listhesis. Electronically Signed   By: Stana Bunting M.D.   On: 11/23/2019 11:15   DG Chest Portable 1 View  Result Date: 11/23/2019 CLINICAL DATA:  Fall EXAM: PORTABLE CHEST 1 VIEW COMPARISON:  11/22/2019 FINDINGS: Left AICD remains in place, unchanged. Cardiomegaly. No confluent opacities, effusions or edema. No acute bony abnormality. IMPRESSION: Cardiomegaly.  No active disease. Electronically Signed   By: Charlett Nose M.D.   On: 11/23/2019 10:36   DG Chest Portable 1 View  Result Date: 11/22/2019 CLINICAL DATA:  Near syncope EXAM: PORTABLE CHEST 1 VIEW COMPARISON:  11/13/2003 FINDINGS: Cardiomegaly with left chest single lead pacer defibrillator. Both lungs are clear. The visualized skeletal structures are unremarkable. IMPRESSION: Cardiomegaly without acute abnormality of the lungs in AP portable projection. Electronically Signed   By: Lauralyn Primes M.D.   On: 11/22/2019 11:01   ECHOCARDIOGRAM COMPLETE  Result Date: 11/24/2019    ECHOCARDIOGRAM REPORT   Patient Name:   Donald Bond Date of Exam: 11/24/2019 Medical Rec #:  [id number]     Height:       67.0 in Accession #:    [id number]    Weight:       185.4 lb Date of Birth:  10/12/1960    BSA:          1.958 m Patient Age:    58 years      BP:           135/90 mmHg Patient Gender: M             HR:           55 bpm. Exam Location:  Inpatient Procedure: 2D Echo, Cardiac Doppler, Color Doppler and Intracardiac            Opacification Agent Indications:    Ventricular Tachycardia I47.2  History:        Patient has prior history of Echocardiogram examinations, most                 recent 06/16/2016. Cardiomyopathy; Signs/Symptoms:Syncope and                  Dizziness/Lightheadedness.  Sonographer:    Eulah Pont RDCS Referring Phys: [id number] MICHAEL ANDREW TILLERY IMPRESSIONS  1. Severe asymmetic hypertrophy of the basal septum up to 1.8 cm. Findings consistent with patient's history of hypertrophic cardiomyopathy, sigmoid variant.  No LVOT obstruction or mitral SAM. Left ventricular ejection fraction, by estimation, is 25 to 30%. The left ventricle has severely decreased function. The left ventricle demonstrates global hypokinesis. There is severe asymmetric left ventricular hypertrophy of the basal-septal segment. Left ventricular diastolic function could not be evaluated.  2. Right ventricular systolic function is mildly reduced. The right ventricular size is mildly enlarged. There is moderately elevated pulmonary artery systolic pressure. The estimated right ventricular systolic pressure is 60.1 mmHg.  3. Left atrial size was severely dilated.  4. Right atrial size was moderately dilated.  5. Severe mitral regurgitation due to restricted PMVL (IIIB). 2D ERO 0.37 cm2, with R vol 57, RF 58%. The mitral valve is degenerative. Severe mitral valve regurgitation. No evidence of mitral stenosis.  6. Tricuspid valve regurgitation is moderate to severe.  7. The aortic valve is tricuspid. There is mild thickening of the aortic valve. Aortic valve regurgitation is mild. No aortic stenosis is present.  8. The inferior vena cava is dilated in size with >50% respiratory variability, suggesting right atrial pressure of 8 mmHg. Comparison(s): Changes from prior study are noted. EF ~25-30%. Severe MR. Moderate to Severe TR. HCM. FINDINGS  Left Ventricle: Severe asymmetic hypertrophy of the basal septum up to 1.8 cm. Findings consistent with patient's history of hypertrophic cardiomyopathy, sigmoid variant. No LVOT obstruction or mitral SAM. Left ventricular ejection fraction, by estimation, is 25 to 30%. The left ventricle has severely decreased function. The left  ventricle demonstrates global hypokinesis. Definity contrast agent was given IV to delineate the left ventricular endocardial borders. The left ventricular internal cavity size was normal in size. There is severe asymmetric left ventricular hypertrophy of the basal-septal segment. Abnormal (paradoxical) septal motion, consistent with left bundle branch block. Left ventricular diastolic function could not be evaluated due to mitral regurgitation (moderate or greater). Left ventricular diastolic function could not be evaluated. Right Ventricle: The right ventricular size is mildly enlarged. No increase in right ventricular wall thickness. Right ventricular systolic function is mildly reduced. There is moderately elevated pulmonary artery systolic pressure. The tricuspid regurgitant velocity is 3.61 m/s, and with an assumed right atrial pressure of 8 mmHg, the estimated right ventricular systolic pressure is 60.1 mmHg. Left Atrium: Left atrial size was severely dilated. Right Atrium: Right atrial size was moderately dilated. Pericardium: Trivial pericardial effusion is present. Mitral Valve: Severe mitral regurgitation due to restricted PMVL (IIIB). 2D ERO 0.37 cm2, with R vol 57, RF 58%. The mitral valve is degenerative in appearance. Mild mitral annular calcification. Severe mitral valve regurgitation, with centrally-directed  jet. No evidence of mitral valve stenosis. Tricuspid Valve: The tricuspid valve is grossly normal. Tricuspid valve regurgitation is moderate to severe. No evidence of tricuspid stenosis. Aortic Valve: The aortic valve is tricuspid. There is mild thickening of the aortic valve. Aortic valve regurgitation is mild. Aortic regurgitation PHT measures 607 msec. No aortic stenosis is present. Pulmonic Valve: The pulmonic valve was grossly normal. Pulmonic valve regurgitation is trivial. No evidence of pulmonic stenosis. Aorta: The aortic root and ascending aorta are structurally normal, with no  evidence of dilitation. Venous: The inferior vena cava is dilated in size with greater than 50% respiratory variability, suggesting right atrial pressure of 8 mmHg. IAS/Shunts: The atrial septum is grossly normal. Additional Comments: A pacer wire is visualized in the right atrium and right ventricle.  LEFT VENTRICLE PLAX 2D LVIDd:         4.90 cm      Diastology LVIDs:  3.50 cm      LV e' medial:    4.67 cm/s LV PW:         1.40 cm      LV E/e' medial:  23.3 LV IVS:        1.30 cm      LV e' lateral:   6.92 cm/s LVOT diam:     2.10 cm      LV E/e' lateral: 15.8 LV SV:         41 LV SV Index:   21 LVOT Area:     3.46 cm  LV Volumes (MOD) LV vol d, MOD A2C: 166.0 ml LV vol d, MOD A4C: 168.0 ml LV vol s, MOD A2C: 90.9 ml LV vol s, MOD A4C: 117.0 ml LV SV MOD A2C:     75.1 ml LV SV MOD A4C:     168.0 ml LV SV MOD BP:      63.0 ml RIGHT VENTRICLE RV S prime:     11.20 cm/s TAPSE (M-mode): 1.8 cm LEFT ATRIUM              Index       RIGHT ATRIUM           Index LA diam:        5.80 cm  2.96 cm/m  RA Area:     27.80 cm LA Vol (A2C):   135.0 ml 68.94 ml/m RA Volume:   88.10 ml  44.99 ml/m LA Vol (A4C):   193.0 ml 98.56 ml/m LA Biplane Vol: 175.0 ml 89.37 ml/m  AORTIC VALVE LVOT Vmax:   69.60 cm/s LVOT Vmean:  51.100 cm/s LVOT VTI:    0.118 m AI PHT:      607 msec  AORTA Ao Root diam: 3.00 cm Ao Asc diam:  3.20 cm MITRAL VALVE                 TRICUSPID VALVE MV Area (PHT): 4.39 cm      TR Peak grad:   52.1 mmHg MV Decel Time: 173 msec      TR Vmax:        361.00 cm/s MR Peak grad:    74.3 mmHg MR Mean grad:    46.0 mmHg   SHUNTS MR Vmax:         431.00 cm/s Systemic VTI:  0.12 m MR Vmean:        314.0 cm/s  Systemic Diam: 2.10 cm MR PISA:         3.53 cm MR PISA Eff ROA: 33 mm MR PISA Radius:  0.75 cm MV E velocity: 109.00 cm/s MV A velocity: 51.00 cm/s MV E/A ratio:  2.14 Lennie Odor MD Electronically signed by Lennie Odor MD Signature Date/Time: 11/24/2019/3:00:17 PM    Final    [facility]  DEVICE CHECK  Result Date: 11/14/2019 Device check completed by industry.  See scanned report for details.  Next remote check scheduled 02/02/20.Judy Pimple, BSN, RN  [facility] REMOTE DEVICE CHECK  Result Date: 11/05/2019 Scheduled remote reviewed. Normal device function.  NST/VT-1 events appears SVT and AF w/ RVR. Overall rates controlled Known PAF on tikosyn and eliquis. Next remote 91 days. JM  Disposition   Pt is being discharged home today in good condition.  Follow-up Plans & Appointments   We stopped the Ranexa.    Heart Healthy Diet    I sent Dr. Graciela Husbands your echo results as well.     Follow-up  Information     Graciella Freer, PA-C Follow up on 12/26/2019.   Specialty: Physician Assistant Why: at 9:40 AM  Contact information: [address] [place] [address] [phone]                   Discharge Medications   Allergies as of 11/26/2019   No Known Allergies      Medication List     STOP taking these medications    ranolazine 500 MG 12 hr tablet Commonly known as: Ranexa       TAKE these medications    apixaban 5 MG Tabs tablet Commonly known as: Eliquis Take 1 tablet (5 mg total) by mouth 2 (two) times daily.   atenolol 100 MG tablet Commonly known as: TENORMIN Take 1 tablet (100 mg total) by mouth at bedtime.   benazepril 5 MG tablet Commonly known as: LOTENSIN Take 0.5 tablets (2.5 mg total) by mouth daily.   dofetilide 500 MCG capsule Commonly known as: TIKOSYN Take 1 capsule (500 mcg total) by mouth 2 (two) times daily.   ibuprofen 200 MG tablet Commonly known as: ADVIL Take 800 mg by mouth every 4 (four) hours as needed for headache or moderate pain.   loratadine 10 MG tablet Commonly known as: CLARITIN Take 10 mg by mouth daily as needed for allergies.   magnesium oxide 400 MG tablet Commonly known as: MAG-OX Take 1 tablet (400 mg total) by mouth daily.           Outstanding Labs/Studies    BMP  Duration of Discharge Encounter   Greater than 30 minutes including physician time.  Signed, Nada Boozer, NP 11/26/2019, 10:42 AM   EP Attending  Patient seen and examined. Agree with above. Still having some NS VT. No sustained VT. He will be discharged home and followup with Dr. Crissie Sickles. His LV dysfunction is new. We will add a low dose ACE which can be uptitrated as an outpatient.   Sharlot Gowda Alexiya Franqui,MD

## 2019-11-26 NOTE — Discharge Instructions (Signed)
Postural Orthostatic Tachycardia Syndrome Postural orthostatic tachycardia syndrome (POTS) is a group of symptoms that occur when a person stands up after lying down. POTS occurs when less blood than normal flows to the body when you stand up. The reduced blood flow to the body makes the heart beat rapidly. POTS may be associated with another medical condition, or it may occur on its own. What are the causes? The cause of this condition is not known, but many conditions and diseases are associated with it. What increases the risk? This condition is more likely to develop in:  Women 15-50 years old.  Women who are pregnant.  Women who are in their period (menstruating).  People who have certain conditions, such as: ? Infection from a virus. ? Attacks of healthy organs by the body's immunity (autoimmune disease). ? Losing a lot of red blood cells (anemia). ? Losing too much water in the body (dehydration). ? An overactive thyroid (hyperthyroidism).  People who take certain medicines.  People who have had a major injury.  People who have had surgery. What are the signs or symptoms? The most common symptom of this condition is light-headedness when one stands from a lying or sitting position. Other symptoms may include:  Feeling a rapid increase in the heartbeat (tachycardia) within 10 minutes of standing up.  Fainting.  Weakness.  Confusion.  Trembling.  Shortness of breath.  Sweating or flushing.  Headache.  Chest pain.  Breathing that is deeper and faster than normal (hyperventilation).  Nausea.  Anxiety. Symptoms may be worse in the morning, and they may be relieved by lying down. How is this diagnosed? This condition is diagnosed based on:  Your symptoms.  Your medical history.  A physical exam.  Checking your heart rate when you are lying down and after you stand up.  Checking your blood pressure when you go from lying down to standing up.  Blood  tests to measure hormones that change with blood pressure. The blood tests will be done when you are lying down and when you are standing up. You may have other tests to check for conditions or diseases that are associated with POTS. How is this treated? Treatment for this condition depends on how severe your symptoms are and whether you have any conditions or diseases that are associated with POTS. Treatment may involve:  Treating any conditions or diseases that are associated with POTS.  Drinking two glasses of water before getting up from a lying position.  Eating more salt (sodium).  Taking medicine to control blood pressure and heart rate (beta-blocker).  Avoiding certain medicines.  Starting an exercise program under the supervision of a health care provider. Follow these instructions at home: Medicines  Take over-the-counter and prescription medicines only as told by your health care provider.  Let your health care provider know about all prescription or over-the-counter medicines. These include herbs, vitamins, and supplements. You may need to stop or adjust some medicines if they cause this condition.  Talk with your health care provider before starting any new medicines. Eating and drinking   Drink enough fluid to keep your urine pale yellow.  If told by your health care provider, drink two glasses of water before getting up from a lying position.  Follow instructions from your health care provider about how much sodium you should eat.  Avoid heavy meals. Eat several small meals a day instead of a few large meals. General instructions  Do an aerobic exercise for 20 minutes   minutes a day, at least 3 days a week.  Ask your health care provider what kinds of exercise are safe for you.  Do not use any products that contain nicotine or tobacco, such as cigarettes and e-cigarettes. These can interfere with blood flow. If you need help quitting, ask your health care  provider.  Keep all follow-up visits as told by your health care provider. This is important. Contact a health care provider if:  Your symptoms do not improve after treatment.  Your symptoms get worse.  You develop new symptoms. Get help right away if:  You have chest pain.  You have difficulty breathing.  You have fainting episodes. These symptoms may represent a serious problem that is an emergency. Do not wait to see if the symptoms will go away. Get medical help right away. Call your local emergency services (911 in the [place] Do not drive yourself to the hospital. Summary  POTS is a condition that can cause light-headedness, fainting, and palpitations when you go from a sitting or lying position to a standing position. It occurs when less blood than normal flows to the body when you stand up.  Treatment for this condition includes treating any underlying conditions, drinking plenty of water, stopping or changing some medicines, or starting an exercise program.  Get help right away if you have chest pain, difficulty breathing, or fainting episodes. These may represent a serious problem that is an emergency. This information is not intended to replace advice given to you by your health care provider. Make sure you discuss any questions you have with your health care provider. Document Revised: 03/30/2017 Document Reviewed: 03/30/2017 Elsevier Patient Education  2020 [employer]   Ventricular Tachycardia  Ventricular tachycardia is a fast heartbeat that begins in the lower chambers of the heart (ventricles). It is a type of abnormal heart rhythm (arrhythmia). A normal heartbeat usually starts when an area in the heart called the sinoatrial (SA) node releases an electrical signal. With ventricular tachycardia, electrical signals in the lower part of the heart fire abnormally and interfere with the electrical signals sent out by the SA node. A normal heart rate is 60-100 beats per  minute. During an episode of ventricular tachycardia, the heart reaches 100 beats per minute or higher. This condition can be life-threatening and should be treated immediately. What are the causes? This condition is caused by abnormal electrical activity in the lower part of the heart. This may result from:  Medicines.  Diseases of the heart muscle (cardiomyopathy).  The heart not getting enough oxygen. This may be caused by blood flow problems in the arteries.  An inflammatory disease that affects multiple areas of the body (sarcoidosis).  Drug use, such as cocaine, amphetamine, or anabolic steroid use. What increases the risk? You are more likely to develop this condition if:  You have had a heart attack.  You have: ? Heart failure or cardiomyopathy. ? Heart defects that you were born with (congenital heart defects). ? Abnormal heart tissue. ? Heart valves that leak or are narrow. ? Diabetes. ? An infection that affects the heart. ? High blood pressure. ? An overactive or underactive thyroid. ? Sleep apnea. ? A family history of stopped heartbeat (cardiac arrest) or coronary artery disease. ? High cholesterol.  You smoke.  You drink alcohol heavily.  You use drugs, such as cocaine. What are the signs or symptoms? Symptoms of this condition include:  A pounding heartbeat.  Feeling as if your heart is  skipping beats or fluttering (palpitations).  Shortness of breath.  Anxiety.  Dizziness.  Light-headedness.  Fainting.  Chest pain.  Cardiac arrest caused by an irregular heartbeat (arrhythmia). How is this diagnosed? This condition may be diagnosed based on:  Your symptoms and medical history.  A physical exam.  Electrocardiogram (ECG). This test is done to check for problems with electrical activity in the heart.  Holter monitor or event monitor test. This test involves wearing a portable device that monitors your heart rate over time. You may also  have other tests, including:  Blood tests.  Chest X-ray.  Echocardiogram. This test involves using sound waves to create images of the heart.  Angiogram. During this test, dye is injected into your bloodstream, and then X-rays are taken. The dye lets your health care provider see how blood flows through your arteries.  Exercise stress test. During this test, an ECG is done while you exercise on a treadmill.  Cardiac CT scan or cardiac MRI. How is this treated? Treatment for this condition depends on the cause. Treatment may include:  Medicines that slow the heart rate and return it to a normal rhythm (anti-arrhythmics).  An electric shock (cardioversion) that makes the heart go back to a normal rhythm.  An electrophysiology study. This procedure can help locate areas of heart tissue that are causing rapid heartbeats. ? In this procedure, a thin, flexible tube (catheter) is inserted into one of your veins and moved to your heart to evaluate your heart's electrical activity. ? In some cases, the heart tissue that is causing problems may be killed with radiofrequency energy delivered through the catheter (radiofrequency ablation). This may help your heart keep a normal rhythm.  An implantable cardioverter defibrillator (ICD). This is a small device that monitors your heartbeat. When it senses an irregular heartbeat, it sends a shock to bring the heartbeat back to normal. The ICD is implanted under the skin in the chest.  Surgery to improve blood flow to the heart.  Genetic counseling to check whether your family members are at risk for ventricular tachycardia. Follow these instructions at home: Lifestyle  Do not use any products that contain nicotine or tobacco, such as cigarettes and e-cigarettes. If you need help quitting, ask your health care provider.  Do not use stimulant drugs, such as cocaine or methamphetamines.  Maintain a healthy weight.  Manage stress. Try to do this  with relaxation exercises, yoga, quiet time, or meditation. Eating and drinking  Eat a healthy diet. This includes plenty of fruits and vegetables, whole grains, lean meats, and low-fat or fat-free dairy products.  Avoid eating foods that are high in saturated fat, trans fat, sugar, or salt (sodium).  Ask your health care provider if you may drink alcohol. ? If alcohol triggers episodes of ventricular tachycardia, do not drink alcohol. ? If alcohol does not trigger episodes, limit alcohol intake to no more than 1 drink a day for nonpregnant women and 2 drinks a day for men. One drink equals 12 oz of beer, 5 oz of wine, or 1 oz of hard liquor. General instructions  Take over-the-counter and prescription medicines only as told by your health care provider.  Exercise regularly. Aim for 150 minutes of moderate exercise or 75 minutes of vigorous exercise per week. Ask your health care provider what exercises are safe for you.  Keep all follow-up visits as told by your health care provider. This is important. Contact a health care provider if:  Your symptoms  get worse.  You develop new symptoms, such as new palpitations.  You feel depressed. Get help right away if:  You have an episode of ventricular tachycardia that lasts 30 seconds or more.  You have chest pain.  You have trouble breathing. These symptoms may represent a serious problem that is an emergency. Do not wait to see if the symptoms will go away. Get medical help right away. Call your local emergency services (911 in the [place] Do not drive yourself to the hospital. Summary  Ventricular tachycardia is a fast heartbeat that begins in the lower chambers of the heart. This condition can be life-threatening and should be treated immediately.  This condition may be treated with medicines, electric shock, radiofrequency energy, surgery, or insertion of an implantable cardioverter defibrillator (ICD).  Get help right away if you  have chest pain, trouble breathing, or ventricular tachycardia symptoms that last more than 30 seconds. This information is not intended to replace advice given to you by your health care provider. Make sure you discuss any questions you have with your health care provider. Document Revised: 01/29/2017 Document Reviewed: 04/09/2016 Elsevier Patient Education  2020 Elsevier Inc.   Sinus Tachycardia  Sinus tachycardia is a kind of fast heartbeat. In sinus tachycardia, the heart beats more than 100 times a minute. Sinus tachycardia starts in a part of the heart called the sinus node. Sinus tachycardia may be harmless, or it may be a sign of a serious condition. What are the causes? This condition may be caused by:  Exercise or exertion.  A fever.  Pain.  Loss of body fluids (dehydration).  Severe bleeding (hemorrhage).  Anxiety and stress.  Certain substances, including: ? Alcohol. ? Caffeine. ? Tobacco and nicotine products. ? Cold medicines. ? Illegal drugs.  Medical conditions including: ? Heart disease. ? An infection. ? An overactive thyroid (hyperthyroidism). ? A lack of red blood cells (anemia). What are the signs or symptoms? Symptoms of this condition include:  A feeling that the heart is beating quickly (palpitations).  Suddenly noticing your heartbeat (cardiac awareness).  Dizziness.  Tiredness (fatigue).  Shortness of breath.  Chest pain.  Nausea.  Fainting. How is this diagnosed? This condition is diagnosed with:  A physical exam.  Other tests, such as: ? Blood tests. ? An electrocardiogram (ECG). This test measures the electrical activity of the heart. ? Ambulatory cardiac monitor. This records your heartbeats for 24 hours or more. You may be referred to a heart specialist (cardiologist). How is this treated? Treatment for this condition depends on the cause or the underlying condition. Treatment may involve:  Treating the underlying  condition.  Taking new medicines or changing your current medicines as told by your health care provider.  Making changes to your diet or lifestyle. Follow these instructions at home: Lifestyle   Do not use any products that contain nicotine or tobacco, such as cigarettes and e-cigarettes. If you need help quitting, ask your health care provider.  Do not use illegal drugs, such as cocaine.  Learn relaxation methods to help you when you get stressed or anxious. These include deep breathing.  Avoid caffeine or other stimulants. Alcohol use   Do not drink alcohol if: ? Your health care provider tells you not to drink. ? You are pregnant, may be pregnant, or are planning to become pregnant.  If you drink alcohol, limit how much you have: ? 0-1 drink a day for women. ? 0-2 drinks a day for men.  Be  aware of how much alcohol is in your drink. In the [place] one drink equals one typical bottle of beer (12 oz), one-half glass of wine (5 oz), or one shot of hard liquor (1 oz). General instructions  Drink enough fluids to keep your urine pale yellow.  Take over-the-counter and prescription medicines only as told by your health care provider.  Keep all follow-up visits as told by your health care provider. This is important. Contact a health care provider if you have:  A fever.  Vomiting or diarrhea that does not go away. Get help right away if you:  Have pain in your chest, upper arms, jaw, or neck.  Become weak or dizzy.  Feel faint.  Have palpitations that do not go away. Summary  In sinus tachycardia, the heart beats more than 100 times a minute.  Sinus tachycardia may be harmless, or it may be a sign of a serious condition.  Treatment for this condition depends on the cause or the underlying condition.  Get help right away if you have pain in your chest, upper arms, jaw, or neck. This information is not intended to replace advice given to you by your health care  provider. Make sure you discuss any questions you have with your health care provider. Document Revised: 04/07/2017 Document Reviewed: 04/07/2017 Elsevier Patient Education  2020 [employer]. We stopped the Ranexa.    Heart Healthy Diet    I sent Dr. Graciela Husbands your echo results as well.

## 2019-11-27 ENCOUNTER — Other Ambulatory Visit (HOSPITAL_COMMUNITY): Payer: BC Managed Care – PPO

## 2019-11-27 ENCOUNTER — Telehealth: Payer: Self-pay | Admitting: Internal Medicine

## 2019-11-27 NOTE — Telephone Encounter (Signed)
New Message:    Pt says he needs a note stating that he is able to go back to work tomorrow(11-28-19) please.

## 2019-11-27 NOTE — Telephone Encounter (Signed)
OK for pt to return to work 11/28/2019 per Dr Graciela Husbands and Dr Ladona Ridgel. Pt's return to work letter released to [employer] as requested.

## 2019-11-27 NOTE — Telephone Encounter (Signed)
Spoke with pt and advised message has been sent to Dr Ladona Ridgel for pt's clearance to return to work.  Pt verbalizes understanding and agrees with current plan.

## 2019-11-30 ENCOUNTER — Telehealth (INDEPENDENT_AMBULATORY_CARE_PROVIDER_SITE_OTHER): Payer: BC Managed Care – PPO | Admitting: Internal Medicine

## 2019-11-30 ENCOUNTER — Other Ambulatory Visit: Payer: Self-pay

## 2019-11-30 VITALS — HR 64 | Ht 67.0 in | Wt 181.0 lb

## 2019-11-30 DIAGNOSIS — I472 Ventricular tachycardia, unspecified: Secondary | ICD-10-CM

## 2019-11-30 DIAGNOSIS — Z95810 Presence of automatic (implantable) cardiac defibrillator: Secondary | ICD-10-CM

## 2019-11-30 DIAGNOSIS — I422 Other hypertrophic cardiomyopathy: Secondary | ICD-10-CM | POA: Diagnosis not present

## 2019-11-30 DIAGNOSIS — I4819 Other persistent atrial fibrillation: Secondary | ICD-10-CM

## 2019-11-30 MED ORDER — BISOPROLOL FUMARATE 5 MG PO TABS: 5.0000 mg | ORAL_TABLET | Freq: Two times a day (BID) | ORAL | 0 refills | Status: DC

## 2019-11-30 NOTE — Patient Instructions (Signed)
Medication Instructions:  Your physician has recommended you make the following change in your medication:   ** Stop Atenolol  ** Begin taking Bisoprolol 5mg  - 1 tablet by mouth twice daily  *If you need a refill on your cardiac medications before your next appointment, please call your pharmacy*   Lab Work: None ordered.  If you have labs (blood work) drawn today and your tests are completely normal, you will receive your results only by: MyChart Message (if you have MyChart) OR . A paper copy in the mail If you have any lab test that is abnormal or we need to change your treatment, we will call you to review the results.   Testing/Procedures: None ordered.    Follow-Up: At [facility], you and your health needs are our priority.  As part of our continuing mission to provide you with exceptional heart care, we have created designated Provider Care Teams.  These Care Teams include your primary Cardiologist (physician) and Advanced Practice Providers (APPs -  Physician Assistants and Nurse Practitioners) who all work together to provide you with the care you need, when you need it.  We recommend signing up for the patient portal called "MyChart".  Sign up information is provided on this After Visit Summary.  MyChart is used to connect with patients for Virtual Visits (Telemedicine).  Patients are able to view lab/test results, encounter notes, upcoming appointments, etc.  Non-urgent messages can be sent to your provider as well.   To learn more about what you can do with MyChart, go to [facility].    Your next appointment:   3 months  The format for your next appointment:   In person  Provider:   [email or web address]

## 2019-11-30 NOTE — Progress Notes (Signed)
Electrophysiology TeleHealth Note   Due to national recommendations of social distancing due to COVID 19, an audio/video telehealth visit is felt to be most appropriate for this patient at this time.  See MyChart message from today for the patient's consent to telehealth for [facility].   Date:  11/30/2019   ID:  Donald Bond, DOB 07-Mar-1960, MRN [id number]  Location: patient's home  Provider location: [address], [place]  Evaluation Performed: Follow-up visit  PCP:  Medicine, [facility]  Cardiologist:     Electrophysiologist:  SK   Chief Complaint:  VT and VF with newly depressed EF   History of Present Illness:    Donald Bond is a 59 y.o. male who presents via audio/video conferencing for a telehealth visit today.  Since last being seen in our clinic for HCM and AFib* and previously treated VT  the patient reports doing pretty well following hospital discharge      He has had problems with Afib and RVR with reaching into (VF zone > 240 bpm)  Atenolol was increased   Unfortunately he has continued to have episodes detected by his device > 210/240  Dofetilide initiated 4/19  Saw JA 6/19 for consideration of catheter ablation>> declined  Recurrent atrial fibrillation.  He got himself an AliveCor monitor and persistent atrial fibrillation documented 5/21 and he underwent cardioversion delayed following the death of his parents  Recurrent afib, and at last office added ranolazine.  Spontaneous conversion, but then had VT-PM resulting in syncope and ICD shock-- was unable to find literature to implicate ranolazine proarrhythmia so continued the drug - but within 24 hrs, had another syncopal VT event-- admitted and ranolazine washed out without recurrent arrhythmia  No interval VT and no afib-- better in sinus.  Echo EF  >> 25 %      DATE TEST EF   4/18 Echo EF normal % LAE (49/2.4/70)  9/21 Echo  25-30%             Date Cr  K Mg Hgb  9/18 0.95 4.3  15.5  4/19  0.89 4.3 2.1 16.6  9/19 0.99 4.2 2.2   2/20 0.91 4.5  45.3 (Hct)  10/20 0.93 4.5 2.2   7/21 1.19 5.4 2.3 16.2   9/21 1.07 5.0 2.0 15.6       The patient denies symptoms of fevers, chills, cough, or new SOB worrisome for COVID 19.    Past Medical History:  Diagnosis Date  . AICD (automatic cardioverter/defibrillator) present X 4  . Headache    "weekly" (06/15/2017)  . History of kidney stones   . Hypertr obst cardiomyop   . ICD--[facility]    Dr. Graciela Husbands  . Orthostatic lightheadedness   . Severe mitral valve regurgitation by Echo 11/24/19 11/26/2019  . SYNCOPE   . TIREDNESS   . Ventricular tachycardia (HCC)    rx via ICD    Past Surgical History:  Procedure Laterality Date  . CARDIAC CATHETERIZATION    . CARDIAC DEFIBRILLATOR PLACEMENT  ~ 2000; ~ 2010   "I've had 4" (06/15/2017)  . CARDIOVERSION  06/29/2016   Procedure: Cardioversion;  Surgeon: Duke Salvia, MD;  Location: [facility] INVASIVE CV LAB;  Service: Cardiovascular;;  . CARDIOVERSION N/A 06/17/2017   Procedure: CARDIOVERSION;  Surgeon: Jake Bathe, MD;  Location: [facility] ENDOSCOPY;  Service: Cardiovascular;  Laterality: N/A;  . CARDIOVERSION N/A 07/11/2019   Procedure: CARDIOVERSION;  Surgeon: Rennis Golden,  Lisette Abu, MD;  Location: [facility] ENDOSCOPY;  Service: Cardiovascular;  Laterality: N/A;  . Guidant Vitality ICD Impantation--Guidant Vitality T135--11/13/2003    . HYDROCELE EXCISION / REPAIR Left 04/2013  . ICD GENERATOR CHANGEOUT N/A 06/29/2016   Procedure: ICD Generator Changeout;  Surgeon: Duke Salvia, MD;  Location: [facility] INVASIVE CV LAB;  Service: Cardiovascular;  Laterality: N/A;  . LAPAROSCOPIC CHOLECYSTECTOMY  2003  . LITHOTRIPSY  X 1    Current Outpatient Medications  Medication Sig Dispense Refill  . apixaban (ELIQUIS) 5 MG TABS tablet Take 1 tablet (5 mg total) by mouth 2 (two) times daily. 60 tablet 6  . atenolol (TENORMIN) 100 MG tablet Take 1 tablet (100  mg total) by mouth at bedtime.    . benazepril (LOTENSIN) 5 MG tablet Take 0.5 tablets (2.5 mg total) by mouth daily. 15 tablet 6  . dofetilide (TIKOSYN) 500 MCG capsule Take 1 capsule (500 mcg total) by mouth 2 (two) times daily. 60 capsule 12  . ibuprofen (ADVIL,MOTRIN) 200 MG tablet Take 800 mg by mouth every 4 (four) hours as needed for headache or moderate pain.     Marland Kitchen loratadine (CLARITIN) 10 MG tablet Take 10 mg by mouth daily as needed for allergies.     . magnesium oxide (MAG-OX) 400 MG tablet Take 1 tablet (400 mg total) by mouth daily. 30 tablet 6   No current facility-administered medications for this visit.    Allergies:   Patient has no known allergies.   Social History:  The patient  reports that he has never smoked. He quit smokeless tobacco use about 26 years ago.  His smokeless tobacco use included chew. He reports current alcohol use of about 3.0 standard drinks of alcohol per week. He reports that he does not use drugs.   Family History:  The patient's   family history includes ALS in his father; Heart attack in his brother; Hypertension in an other family member.   ROS:  Please see the history of present illness.   All other systems are personally reviewed and negative.    Exam:    Vital Signs:  Pulse 64   Ht 5\' 7"  (1.702 m)   Wt 181 lb (82.1 kg)   BMI 28.35 kg/m        Labs/Other Tests and Data Reviewed:    Recent Labs: 11/23/2019: ALT 25; Hemoglobin 15.6; Platelets 207 11/24/2019: TSH 3.229 11/26/2019: BUN 13; Creatinine, Ser 1.07; Magnesium 2.0; Potassium 5.0; Sodium 140   Wt Readings from Last 3 Encounters:  11/30/19 181 lb (82.1 kg)  11/26/19 185 lb 10.4 oz (84.2 kg)  11/22/19 184 lb (83.5 kg)     Other studies personally reviewed:     ASSESSMENT & PLAN:    HCM with significant LV dysfunction (gene positive)  VT/VF  Atrial fib persistent  ICD [employer]   No interval arrhythmia, and feeling better in sinus which is holding  With  newly identified LV dysfunction, two issues, what and why.  AF rates 15-20% > 100 but this does not seem so likely.  Is this the natural hx of his HCM?  CAD? Will engage advanced CHF team --   For now will stop atenolol and begin bisoprolol-- and may benefit from dig, now w LV dysfunction, but has had very rapid afib so BB will be necessary and perhaps at higher doses, which may make a secondary transition ( one change at a time) from lisinopril to entresto problematic  obviously he and his  wife are still shaken by the events-- this in the context of a great deal of stress related to the loss of his both parents this summer       COVID 19 screen The patient denies symptoms of COVID 19 at this time.  The importance of social distancing was discussed today.  Follow-up:  68m Evaluation in heart failure clinic     Current medicines are reviewed at length with the patient today.   The patient concerns regarding no medicines.  The following changes were made today:  Stop atenolol Begin bisoprolol 5 mg bid   Labs/ tests ordered today include:   No orders of the defined types were placed in this encounter.     Patient Risk:  after full review of this patients clinical status, I feel that they are at moderate risk at this time.  Today, I have spent 12 minutes with the patient with telehealth technology discussing the above.  Signed, Sherryl Manges, MD  11/30/2019 2:48 PM     [facility] [address] [place] [address] [phone] (office) [phone] (fax)

## 2019-12-18 ENCOUNTER — Other Ambulatory Visit (HOSPITAL_COMMUNITY): Payer: BC Managed Care – PPO

## 2019-12-25 ENCOUNTER — Other Ambulatory Visit: Payer: Self-pay | Admitting: Internal Medicine

## 2019-12-25 NOTE — H&P (View-Only) (Signed)
PCP:  Medicine, [facility] Primary Cardiologist: No primary care provider on file. Electrophysiologist: Sherryl Manges, MD   Donald Bond is a 59 y.o. male seen today for Sherryl Manges, MD for routine electrophysiology followup.   Pt had recurrent atrial fibrillation.  He got himself an AliveCor monitor and persistent atrial fibrillation documented 5/21 and he underwent cardioversion delayed following the death of his parents  Recurrent afib, and at last office added ranolazine.  Spontaneous conversion, but then had VT-PM resulting in syncope and ICD shock-- was unable to find literature to implicate ranolazine proarrhythmia so continued the drug - but within 24 hrs, had another syncopal VT event-- admitted and ranolazine washed out without recurrent arrhythmia  Seen by Dr. Graciela Husbands 11/30/19. Atenolol changed to bisoprolol with LV dysfunction.    Since last being seen in our clinic the patient reports doing well. No further shocks. He has felt intermittent fatigue since switch to bisoprolol, but not severe. This also appears to correlate with patient being back in AF by EKG. He does not feel as poorly as he has in the past in AF.   he denies chest pain, palpitations, dyspnea, PND, orthopnea, nausea, vomiting, dizziness, syncope, edema, weight gain, or early satiety.   DEVICE HISTORY: 3 previous devices. Most recently underwent gen change 2018 w [employer] pouch. [employer] single chamber ICD   DATE TEST EF   4/18 Echo EF normal % LAE (49/2.4/70)  9/21 Echo  25-30%           Date Cr  K Mg Hgb  9/18 0.95 4.3  15.5  4/19  0.89 4.3 2.1 16.6  9/19 0.99 4.2 2.2   2/20 0.91 4.5  45.3 (Hct)  10/20 0.93 4.5 2.2   7/21 1.19 5.4 2.3 16.2   9/21 1.07 5.0 2.0 15.6    Past Medical History:  Diagnosis Date  . AICD (automatic cardioverter/defibrillator) present X 4  . Headache    "weekly" (06/15/2017)  . History of kidney stones   . Hypertr obst  cardiomyop   . ICD--[facility]    Dr. Graciela Husbands  . Orthostatic lightheadedness   . Severe mitral valve regurgitation by Echo 11/24/19 11/26/2019  . SYNCOPE   . TIREDNESS   . Ventricular tachycardia (HCC)    rx via ICD   Past Surgical History:  Procedure Laterality Date  . CARDIAC CATHETERIZATION    . CARDIAC DEFIBRILLATOR PLACEMENT  ~ 2000; ~ 2010   "I've had 4" (06/15/2017)  . CARDIOVERSION  06/29/2016   Procedure: Cardioversion;  Surgeon: Duke Salvia, MD;  Location: [facility] INVASIVE CV LAB;  Service: Cardiovascular;;  . CARDIOVERSION N/A 06/17/2017   Procedure: CARDIOVERSION;  Surgeon: Jake Bathe, MD;  Location: [facility] ENDOSCOPY;  Service: Cardiovascular;  Laterality: N/A;  . CARDIOVERSION N/A 07/11/2019   Procedure: CARDIOVERSION;  Surgeon: Chrystie Nose, MD;  Location: [facility] ENDOSCOPY;  Service: Cardiovascular;  Laterality: N/A;  . Guidant Vitality ICD Impantation--Guidant Vitality T135--11/13/2003    . HYDROCELE EXCISION / REPAIR Left 04/2013  . ICD GENERATOR CHANGEOUT N/A 06/29/2016   Procedure: ICD Generator Changeout;  Surgeon: Duke Salvia, MD;  Location[facility] INVASIVE CV LAB;  Service: Cardiovascular;  Laterality: N/A;  . LAPAROSCOPIC CHOLECYSTECTOMY  2003  . LITHOTRIPSY  X 1    Current Outpatient Medications  Medication Sig Dispense Refill  . apixaban (ELIQUIS) 5 MG TABS tablet Take 1 tablet (5 mg total) by mouth 2 (two) times daily. 60 tablet 6  . benazepril (  LOTENSIN) 5 MG tablet Take 0.5 tablets (2.5 mg total) by mouth daily. 15 tablet 6  . bisoprolol (ZEBETA) 5 MG tablet Take 1 tablet (5 mg total) by mouth in the morning and at bedtime. 180 tablet 0  . dofetilide (TIKOSYN) 500 MCG capsule Take 1 capsule (500 mcg total) by mouth 2 (two) times daily. 60 capsule 12  . ibuprofen (ADVIL,MOTRIN) 200 MG tablet Take 800 mg by mouth every 4 (four) hours as needed for headache or moderate pain.     Marland Kitchen loratadine (CLARITIN) 10 MG tablet Take 10 mg by mouth daily as needed for allergies.       . magnesium oxide (MAG-OX) 400 MG tablet Take 1 tablet (400 mg total) by mouth daily. 30 tablet 6   No current facility-administered medications for this visit.    No Known Allergies  Social History   Socioeconomic History  . Marital status: Married    Spouse name: Not on file  . Number of children: Not on file  . Years of education: Not on file  . Highest education level: Not on file  Occupational History  . Not on file  Tobacco Use  . Smoking status: Never Smoker  . Smokeless tobacco: Former Neurosurgeon    Types: Engineer, drilling  . Vaping Use: Never used  Substance and Sexual Activity  . Alcohol use: Yes    Alcohol/week: 3.0 standard drinks    Types: 3 Cans of beer per week  . Drug use: No  . Sexual activity: Yes  Other Topics Concern  . Not on file  Social History Narrative   Lives with spouse in [place]   employed   Social Determinants of Health   Financial Resource Strain:   . Difficulty of Paying Living Expenses: Not on file  Food Insecurity:   . Worried About Programme researcher, broadcasting/film/video in the Last Year: Not on file  . Ran Out of Food in the Last Year: Not on file  Transportation Needs:   . Lack of Transportation (Medical): Not on file  . Lack of Transportation (Non-Medical): Not on file  Physical Activity:   . Days of Exercise per Week: Not on file  . Minutes of Exercise per Session: Not on file  Stress:   . Feeling of Stress : Not on file  Social Connections:   . Frequency of Communication with Friends and Family: Not on file  . Frequency of Social Gatherings with Friends and Family: Not on file  . Attends Religious Services: Not on file  . Active Member of Clubs or Organizations: Not on file  . Attends Banker Meetings: Not on file  . Marital Status: Not on file  Intimate Partner Violence:   . Fear of Current or Ex-Partner: Not on file  . Emotionally Abused: Not on file  . Physically Abused: Not on file  . Sexually Abused: Not on file      Review of Systems: General: No chills, fever, night sweats or weight changes  Cardiovascular:  No chest pain, dyspnea on exertion, edema, orthopnea, palpitations, paroxysmal nocturnal dyspnea Dermatological: No rash, lesions or masses Respiratory: No cough, dyspnea Urologic: No hematuria, dysuria Abdominal: No nausea, vomiting, diarrhea, bright red blood per rectum, melena, or hematemesis Neurologic: No visual changes, weakness, changes in mental status All other systems reviewed and are otherwise negative except as noted above.  Physical Exam: Vitals:   12/26/19 1001  BP: 112/78  Pulse: 91  SpO2: 98%  Weight: 178 lb (  80.7 kg)  Height: 5\' 7"  (1.702 m)    GEN- The patient is well appearing, alert and oriented x 3 today.   HEENT: normocephalic, atraumatic; sclera clear, conjunctiva pink; hearing intact; oropharynx clear; neck supple, no JVP Lymph- no cervical lymphadenopathy Lungs- Clear to ausculation bilaterally, normal work of breathing.  No wheezes, rales, rhonchi Heart- Irregularly, irregular rate and rhythm, no murmurs, rubs or gallops, PMI not laterally displaced GI- soft, non-tender, non-distended, bowel sounds present, no hepatosplenomegaly Extremities- no clubbing, cyanosis, or edema; DP/PT/radial pulses 2+ bilaterally MS- no significant deformity or atrophy Skin- warm and dry, no rash or lesion Psych- euthymic mood, full affect Neuro- strength and sensation are intact  EKG is ordered. Personal review of EKG today shows pt back in AF with controlled VR at 77 bpm  Personal review of EKG from 11/26/2019 showed Sinus bradycardia with 1st degree AV block at 56 bpm, QRS 144 ms, PR interval 286 ms  Additional studies reviewed include: Previous EP office notes, recent admit notes.  Assessment and Plan:  1. HCM with significant LV dysfunction s/p BS ICD Echo 11/24/2019 LVEF 25-30% Continue bisoprolol 5 mg daily Continue benazepril. We discussed switching to losartan  today with a goal of getting on to [facility]. He has had hyperkalemia on this in the past, so will check labs today and defer to HF clinic after discussion with patient for consideration of management of his potassium with Lokelma or similar if remains elevated. Review driving recommendations. No driving x6 months after appropriate therapy. Will refer to HF clinic. He has had new LV dysfunction, new VT, and difficulty titrating medications due to overall soft pressures and hyperkalemia.   2. Paroxysmal Atrial fibrillation  Continue tikosyn 500 mg BID. QTc has been stable.  EKG today shows AF with controlled VR in 70s  He has had short 20-30 second V rates in the 180s (10/15 and 10/17) and NSVT otherwise. This may be being driven by his AF. Will plan [facility].  Labs today.  He tolerates prolonged AF poorly. Will plan [facility] next week. If continues to fail tikosyn will have to consider other agent. Although he is young, with LV dysfunction amiodarone may be the only other best option for him.   3. VT/VF This happened, and recurred in the setting of ranolazine use + tikosyn, although we couldn't find significant literature citing ranolazine and proarrhythmia; That being said. He has not had further sustained VT off ranolazine. He has had NSVT over the past 2 weeks. ? If related to being back in AF.   [facility], PA-C  12/26/19 10:24 AM

## 2019-12-25 NOTE — Progress Notes (Signed)
PCP:  Medicine, [facility] Primary Cardiologist: No primary care provider on file. Electrophysiologist: Sherryl Manges, MD   Donald Bond is a 59 y.o. male seen today for Sherryl Manges, MD for routine electrophysiology followup.   Pt had recurrent atrial fibrillation.  He got himself an AliveCor monitor and persistent atrial fibrillation documented 5/21 and he underwent cardioversion delayed following the death of his parents  Recurrent afib, and at last office added ranolazine.  Spontaneous conversion, but then had VT-PM resulting in syncope and ICD shock-- was unable to find literature to implicate ranolazine proarrhythmia so continued the drug - but within 24 hrs, had another syncopal VT event-- admitted and ranolazine washed out without recurrent arrhythmia  Seen by Dr. Graciela Husbands 11/30/19. Atenolol changed to bisoprolol with LV dysfunction.    Since last being seen in our clinic the patient reports doing well. No further shocks. He has felt intermittent fatigue since switch to bisoprolol, but not severe. This also appears to correlate with patient being back in AF by EKG. He does not feel as poorly as he has in the past in AF.   he denies chest pain, palpitations, dyspnea, PND, orthopnea, nausea, vomiting, dizziness, syncope, edema, weight gain, or early satiety.   DEVICE HISTORY: 3 previous devices. Most recently underwent gen change 2018 w AEGIS pouch. [employer] Sci single chamber ICD   DATE TEST EF   4/18 Echo EF normal % LAE (49/2.4/70)  9/21 Echo  25-30%           Date Cr  K Mg Hgb  9/18 0.95 4.3  15.5  4/19  0.89 4.3 2.1 16.6  9/19 0.99 4.2 2.2   2/20 0.91 4.5  45.3 (Hct)  10/20 0.93 4.5 2.2   7/21 1.19 5.4 2.3 16.2   9/21 1.07 5.0 2.0 15.6    Past Medical History:  Diagnosis Date  . AICD (automatic cardioverter/defibrillator) present X 4  . Headache    "weekly" (06/15/2017)  . History of kidney stones   . Hypertr obst  cardiomyop   . ICD--[facility]    Dr. Graciela Husbands  . Orthostatic lightheadedness   . Severe mitral valve regurgitation by Echo 11/24/19 11/26/2019  . SYNCOPE   . TIREDNESS   . Ventricular tachycardia (HCC)    rx via ICD   Past Surgical History:  Procedure Laterality Date  . CARDIAC CATHETERIZATION    . CARDIAC DEFIBRILLATOR PLACEMENT  ~ 2000; ~ 2010   "I've had 4" (06/15/2017)  . CARDIOVERSION  06/29/2016   Procedure: Cardioversion;  Surgeon: Duke Salvia, MD;  Location: [facility] INVASIVE CV LAB;  Service: Cardiovascular;;  . CARDIOVERSION N/A 06/17/2017   Procedure: CARDIOVERSION;  Surgeon: Jake Bathe, MD;  Location[facility] ENDOSCOPY;  Service: Cardiovascular;  Laterality: N/A;  . CARDIOVERSION N/A 07/11/2019   Procedure: CARDIOVERSION;  Surgeon: Chrystie Nose, MD;  Location: [facility] ENDOSCOPY;  Service: Cardiovascular;  Laterality: N/A;  . Guidant Vitality ICD Impantation--Guidant Vitality T135--11/13/2003    . HYDROCELE EXCISION / REPAIR Left 04/2013  . ICD GENERATOR CHANGEOUT N/A 06/29/2016   Procedure: ICD Generator Changeout;  Surgeon: Duke Salvia, MD;  Location[facility] INVASIVE CV LAB;  Service: Cardiovascular;  Laterality: N/A;  . LAPAROSCOPIC CHOLECYSTECTOMY  2003  . LITHOTRIPSY  X 1    Current Outpatient Medications  Medication Sig Dispense Refill  . apixaban (ELIQUIS) 5 MG TABS tablet Take 1 tablet (5 mg total) by mouth 2 (two) times daily. 60 tablet 6  . benazepril (  LOTENSIN) 5 MG tablet Take 0.5 tablets (2.5 mg total) by mouth daily. 15 tablet 6  . bisoprolol (ZEBETA) 5 MG tablet Take 1 tablet (5 mg total) by mouth in the morning and at bedtime. 180 tablet 0  . dofetilide (TIKOSYN) 500 MCG capsule Take 1 capsule (500 mcg total) by mouth 2 (two) times daily. 60 capsule 12  . ibuprofen (ADVIL,MOTRIN) 200 MG tablet Take 800 mg by mouth every 4 (four) hours as needed for headache or moderate pain.     Marland Kitchen loratadine (CLARITIN) 10 MG tablet Take 10 mg by mouth daily as needed for allergies.       . magnesium oxide (MAG-OX) 400 MG tablet Take 1 tablet (400 mg total) by mouth daily. 30 tablet 6   No current facility-administered medications for this visit.    No Known Allergies  Social History   Socioeconomic History  . Marital status: Married    Spouse name: Not on file  . Number of children: Not on file  . Years of education: Not on file  . Highest education level: Not on file  Occupational History  . Not on file  Tobacco Use  . Smoking status: Never Smoker  . Smokeless tobacco: Former Neurosurgeon    Types: Engineer, drilling  . Vaping Use: Never used  Substance and Sexual Activity  . Alcohol use: Yes    Alcohol/week: 3.0 standard drinks    Types: 3 Cans of beer per week  . Drug use: No  . Sexual activity: Yes  Other Topics Concern  . Not on file  Social History Narrative   Lives with spouse in [place]   employed   Social Determinants of Health   Financial Resource Strain:   . Difficulty of Paying Living Expenses: Not on file  Food Insecurity:   . Worried About Programme researcher, broadcasting/film/video in the Last Year: Not on file  . Ran Out of Food in the Last Year: Not on file  Transportation Needs:   . Lack of Transportation (Medical): Not on file  . Lack of Transportation (Non-Medical): Not on file  Physical Activity:   . Days of Exercise per Week: Not on file  . Minutes of Exercise per Session: Not on file  Stress:   . Feeling of Stress : Not on file  Social Connections:   . Frequency of Communication with Friends and Family: Not on file  . Frequency of Social Gatherings with Friends and Family: Not on file  . Attends Religious Services: Not on file  . Active Member of Clubs or Organizations: Not on file  . Attends Banker Meetings: Not on file  . Marital Status: Not on file  Intimate Partner Violence:   . Fear of Current or Ex-Partner: Not on file  . Emotionally Abused: Not on file  . Physically Abused: Not on file  . Sexually Abused: Not on file      Review of Systems: General: No chills, fever, night sweats or weight changes  Cardiovascular:  No chest pain, dyspnea on exertion, edema, orthopnea, palpitations, paroxysmal nocturnal dyspnea Dermatological: No rash, lesions or masses Respiratory: No cough, dyspnea Urologic: No hematuria, dysuria Abdominal: No nausea, vomiting, diarrhea, bright red blood per rectum, melena, or hematemesis Neurologic: No visual changes, weakness, changes in mental status All other systems reviewed and are otherwise negative except as noted above.  Physical Exam: Vitals:   12/26/19 1001  BP: 112/78  Pulse: 91  SpO2: 98%  Weight: 178 lb (  80.7 kg)  Height: 5\' 7"  (1.702 m)    GEN- The patient is well appearing, alert and oriented x 3 today.   HEENT: normocephalic, atraumatic; sclera clear, conjunctiva pink; hearing intact; oropharynx clear; neck supple, no JVP Lymph- no cervical lymphadenopathy Lungs- Clear to ausculation bilaterally, normal work of breathing.  No wheezes, rales, rhonchi Heart- Irregularly, irregular rate and rhythm, no murmurs, rubs or gallops, PMI not laterally displaced GI- soft, non-tender, non-distended, bowel sounds present, no hepatosplenomegaly Extremities- no clubbing, cyanosis, or edema; DP/PT/radial pulses 2+ bilaterally MS- no significant deformity or atrophy Skin- warm and dry, no rash or lesion Psych- euthymic mood, full affect Neuro- strength and sensation are intact  EKG is ordered. Personal review of EKG today shows pt back in AF with controlled VR at 77 bpm  Personal review of EKG from 11/26/2019 showed Sinus bradycardia with 1st degree AV block at 56 bpm, QRS 144 ms, PR interval 286 ms  Additional studies reviewed include: Previous EP office notes, recent admit notes.  Assessment and Plan:  1. HCM with significant LV dysfunction s/p BS ICD Echo 11/24/2019 LVEF 25-30% Continue bisoprolol 5 mg daily Continue benazepril. We discussed switching to losartan  today with a goal of getting on to [facility]. He has had hyperkalemia on this in the past, so will check labs today and defer to HF clinic after discussion with patient for consideration of management of his potassium with Lokelma or similar if remains elevated. Review driving recommendations. No driving x6 months after appropriate therapy. Will refer to HF clinic. He has had new LV dysfunction, new VT, and difficulty titrating medications due to overall soft pressures and hyperkalemia.   2. Paroxysmal Atrial fibrillation  Continue tikosyn 500 mg BID. QTc has been stable.  EKG today shows AF with controlled VR in 70s  He has had short 20-30 second V rates in the 180s (10/15 and 10/17) and NSVT otherwise. This may be being driven by his AF. Will plan [facility].  Labs today.  He tolerates prolonged AF poorly. Will plan [facility] next week. If continues to fail tikosyn will have to consider other agent. Although he is young, with LV dysfunction amiodarone may be the only other best option for him.   3. VT/VF This happened, and recurred in the setting of ranolazine use + tikosyn, although we couldn't find significant literature citing ranolazine and proarrhythmia; That being said. He has not had further sustained VT off ranolazine. He has had NSVT over the past 2 weeks. ? If related to being back in AF.   [facility], PA-C  12/26/19 10:24 AM

## 2019-12-26 ENCOUNTER — Ambulatory Visit (INDEPENDENT_AMBULATORY_CARE_PROVIDER_SITE_OTHER): Payer: BC Managed Care – PPO | Admitting: Student

## 2019-12-26 ENCOUNTER — Encounter: Payer: Self-pay | Admitting: Student

## 2019-12-26 ENCOUNTER — Other Ambulatory Visit: Payer: Self-pay

## 2019-12-26 VITALS — BP 112/78 | HR 91 | Ht 67.0 in | Wt 178.0 lb

## 2019-12-26 DIAGNOSIS — I480 Paroxysmal atrial fibrillation: Secondary | ICD-10-CM

## 2019-12-26 DIAGNOSIS — I472 Ventricular tachycardia, unspecified: Secondary | ICD-10-CM

## 2019-12-26 DIAGNOSIS — I422 Other hypertrophic cardiomyopathy: Secondary | ICD-10-CM

## 2019-12-26 DIAGNOSIS — Z95810 Presence of automatic (implantable) cardiac defibrillator: Secondary | ICD-10-CM | POA: Diagnosis not present

## 2019-12-26 LAB — CUP PACEART INCLINIC DEVICE CHECK
Date Time Interrogation Session: 20211026121209
HighPow Impedance: 43 Ohm
HighPow Impedance: 47 Ohm
Implantable Lead Implant Date: 20000815
Implantable Lead Location: 753860
Implantable Lead Model: 144
Implantable Lead Serial Number: 334759
Implantable Pulse Generator Implant Date: 20180430
Lead Channel Impedance Value: 777 Ohm
Lead Channel Pacing Threshold Amplitude: 1 V
Lead Channel Pacing Threshold Pulse Width: 0.4 ms
Lead Channel Sensing Intrinsic Amplitude: 25 mV
Lead Channel Setting Pacing Amplitude: 2.5 V
Lead Channel Setting Pacing Pulse Width: 0.4 ms
Lead Channel Setting Sensing Sensitivity: 0.5 mV
Pulse Gen Serial Number: 198978

## 2019-12-26 LAB — BASIC METABOLIC PANEL
BUN/Creatinine Ratio: 17 (ref 9–20)
BUN: 13 mg/dL (ref 6–24)
CO2: 25 mmol/L (ref 20–29)
Calcium: 9.7 mg/dL (ref 8.7–10.2)
Chloride: 106 mmol/L (ref 96–106)
Creatinine, Ser: 0.75 mg/dL — ABNORMAL LOW (ref 0.76–1.27)
GFR calc Af Amer: 117 mL/min/{1.73_m2} (ref >59)
GFR calc non Af Amer: 101 mL/min/{1.73_m2} (ref >59)
Glucose: 96 mg/dL (ref 65–99)
Potassium: 4.3 mmol/L (ref 3.5–5.2)
Sodium: 139 mmol/L (ref 134–144)

## 2019-12-26 LAB — CBC WITH DIFFERENTIAL/PLATELET
Basophils Absolute: 0 10*3/uL (ref 0.0–0.2)
Basos: 0 %
EOS (ABSOLUTE): 0 10*3/uL (ref 0.0–0.4)
Eos: 0 %
Hematocrit: 44.5 % (ref 37.5–51.0)
Hemoglobin: 15.3 g/dL (ref 13.0–17.7)
Lymphocytes Absolute: 1.4 10*3/uL (ref 0.7–3.1)
Lymphs: 19 %
MCH: 30.1 pg (ref 26.6–33.0)
MCHC: 34.4 g/dL (ref 31.5–35.7)
MCV: 88 fL (ref 79–97)
Monocytes Absolute: 0.6 10*3/uL (ref 0.1–0.9)
Monocytes: 8 %
Neutrophils Absolute: 5.5 10*3/uL (ref 1.4–7.0)
Neutrophils: 73 %
Platelets: 215 10*3/uL (ref 150–450)
RBC: 5.08 x10E6/uL (ref 4.14–5.80)
RDW: 14.7 % (ref 11.6–15.4)
WBC: 7.6 10*3/uL (ref 3.4–10.8)

## 2019-12-26 LAB — MAGNESIUM: Magnesium: 2.3 mg/dL (ref 1.6–2.3)

## 2019-12-26 NOTE — Patient Instructions (Addendum)
Medication Instructions:  *If you need a refill on your cardiac medications before your next appointment, please call your pharmacy*  Lab Work: You will need to have a pre procedure Covid Screening Test on Monday 01/01/20 at 8:45 am  --This is a Drive Up Visit at [address] [place], [place] [address].  --Someone will direct you to the appropriate testing line. Stay in your car and someone will be with you shortly.  -- Your physician has recommended that you have lab work today: BMET, CBC, and Magnesium Level If you have labs (blood work) drawn today and your tests are completely normal, you will receive your results only by: Marland Kitchen MyChart Message (if you have MyChart) OR . A paper copy in the mail If you have any lab test that is abnormal or we need to change your treatment, we will call you to review the results.  Follow-Up: At [facility], you and your health needs are our priority.  As part of our continuing mission to provide you with exceptional heart care, we have created designated Provider Care Teams.  These Care Teams include your primary Cardiologist (physician) and Advanced Practice Providers (APPs -  Physician Assistants and Nurse Practitioners) who all work together to provide you with the care you need, when you need it.  We recommend signing up for the patient portal called "MyChart".  Sign up information is provided on this After Visit Summary.  MyChart is used to connect with patients for Virtual Visits (Telemedicine).  Patients are able to view lab/test results, encounter notes, upcoming appointments, etc.  Non-urgent messages can be sent to your provider as well.   To learn more about what you can do with MyChart, go to [email or web address].    Your next appointment:   -- Your physician recommends that you schedule a follow-up appointment on Wednesday 01/10/20 at 11:45 am with Otilio Saber, PA-C   -- Your physician recommends that you schedule a follow-up  appointment in: December with Dr. Graciela Husbands -- 02/06/20 at 3:45 pm  Remote monitoring is used to monitor your ICD from home. This monitoring reduces the number of office visits required to check your device to one time per year. It allows [place] to keep an eye on the functioning of your device to ensure it is working properly. You are scheduled for a device check from home on 02/02/20. You may send your transmission at any time that day. If you have a wireless device, the transmission will be sent automatically. After your physician reviews your transmission, you will receive a postcard with your next transmission date.  The format for your next appointment:   In Person with Sherryl Manges, MD  -- A referral has been placed for you to see Dr. Shirlee Latch or Dr. Jones Broom at the Advanced [facility] Failure [facility] at [facility] in the [facility]. Someone will contact you about scheduling. --       Your provider has recommended a cardioversion.   You are scheduled for a cardioversion on Wednesday 01/03/20 at 10:00 am with Dr. Anne Fu and associates. Please go to [facility] 2nd Floor Short Stay at 8:30 am.  Enter through the [facility] Do not have any food or drink after midnight on 01/02/20.  You may take your medicines with a sip of water on the day of your procedure.  You will need someone to drive you home following your procedure.   Call the [facility] office  at [phone] if you have any questions, problems or concerns.     Electrical Cardioversion Electrical cardioversion is the delivery of a jolt of electricity to change the rhythm of the heart. Sticky patches or metal paddles are placed on the chest to deliver the electricity from a device. This is done to restore a normal rhythm. A rhythm that is too fast or not regular keeps the heart from pumping well. Electrical cardioversion is done in an emergency if:   There is low or no blood  pressure as a result of the heart rhythm.   Normal rhythm must be restored as fast as possible to protect the brain and heart from further damage.   It may save a life. Cardioversion may be done for heart rhythms that are not immediately life threatening, such as atrial fibrillation or flutter, in which:   The heart is beating too fast or is not regular.   Medicine to change the rhythm has not worked.   It is safe to wait in order to allow time for preparation.  Symptoms of the abnormal rhythm are bothersome.  The risk of stroke and other serious problems can be reduced.  LET [facility] CARE PROVIDER KNOW ABOUT:   Any allergies you have.  All medicines you are taking, including vitamins, herbs, eye drops, creams, and over-the-counter medicines.  Previous problems you or members of your family have had with the use of anesthetics.   Any blood disorders you have.   Previous surgeries you have had.   Medical conditions you have.   RISKS AND COMPLICATIONS  Generally, this is a safe procedure. However, problems can occur and include:   Breathing problems related to the anesthetic used.  A blood clot that breaks free and travels to other parts of your body. This could cause a stroke or other problems. The risk of this is lowered by use of blood-thinning medicine (anticoagulant) prior to the procedure.  Cardiac arrest (rare).   BEFORE THE PROCEDURE   You may have tests to detect blood clots in your heart and to evaluate heart function.  You may start taking anticoagulants so your blood does not clot as easily.   Medicines may be given to help stabilize your heart rate and rhythm.   PROCEDURE  You will be given medicine through an IV tube to reduce discomfort and make you sleepy (sedative).   An electrical shock will be delivered.   AFTER THE PROCEDURE Your heart rhythm will be watched to make sure it does not change. You will need someone to drive you  home

## 2020-01-01 ENCOUNTER — Other Ambulatory Visit (HOSPITAL_COMMUNITY)
Admission: RE | Admit: 2020-01-01 | Discharge: 2020-01-01 | Disposition: A | Discharge status: Home / Self Care | Payer: BC Managed Care – PPO | Source: Ambulatory Visit | Attending: Cardiology | Admitting: Cardiology

## 2020-01-01 DIAGNOSIS — Z20822 Contact with and (suspected) exposure to covid-19: Secondary | ICD-10-CM | POA: Insufficient documentation

## 2020-01-01 DIAGNOSIS — Z01818 Encounter for other preprocedural examination: Secondary | ICD-10-CM | POA: Diagnosis not present

## 2020-01-01 LAB — SARS CORONAVIRUS 2 (TAT 6-24 HRS): SARS Coronavirus 2: NEGATIVE

## 2020-01-01 NOTE — Addendum Note (Signed)
Addended by: Winifred Olive on: 01/01/2020 11:40 AM   Modules accepted: Orders

## 2020-01-03 ENCOUNTER — Ambulatory Visit (HOSPITAL_COMMUNITY): Payer: BC Managed Care – PPO | Admitting: Anesthesiology

## 2020-01-03 ENCOUNTER — Telehealth (HOSPITAL_COMMUNITY): Payer: Self-pay | Admitting: Vascular Surgery

## 2020-01-03 ENCOUNTER — Encounter (HOSPITAL_COMMUNITY): Payer: Self-pay | Admitting: Cardiology

## 2020-01-03 ENCOUNTER — Encounter (HOSPITAL_COMMUNITY)
Admission: RE | Disposition: A | Discharge status: Home / Self Care | Payer: Self-pay | Source: Home / Self Care | Attending: Cardiology

## 2020-01-03 ENCOUNTER — Ambulatory Visit (HOSPITAL_COMMUNITY)
Admission: RE | Admit: 2020-01-03 | Discharge: 2020-01-03 | Disposition: A | Discharge status: Home / Self Care | Payer: BC Managed Care – PPO | Attending: Cardiology | Admitting: Cardiology

## 2020-01-03 DIAGNOSIS — Z95810 Presence of automatic (implantable) cardiac defibrillator: Secondary | ICD-10-CM | POA: Insufficient documentation

## 2020-01-03 DIAGNOSIS — I480 Paroxysmal atrial fibrillation: Secondary | ICD-10-CM | POA: Diagnosis not present

## 2020-01-03 DIAGNOSIS — I10 Essential (primary) hypertension: Secondary | ICD-10-CM | POA: Diagnosis not present

## 2020-01-03 DIAGNOSIS — I4892 Unspecified atrial flutter: Secondary | ICD-10-CM | POA: Insufficient documentation

## 2020-01-03 DIAGNOSIS — I429 Cardiomyopathy, unspecified: Secondary | ICD-10-CM | POA: Diagnosis not present

## 2020-01-03 DIAGNOSIS — I484 Atypical atrial flutter: Secondary | ICD-10-CM

## 2020-01-03 HISTORY — PX: CARDIOVERSION: SHX1299

## 2020-01-03 SURGERY — CARDIOVERSION
Anesthesia: General

## 2020-01-03 MED ORDER — SODIUM CHLORIDE 0.9 % IV SOLN: INTRAVENOUS | Status: DC | PRN

## 2020-01-03 MED ORDER — LIDOCAINE 2% (20 MG/ML) 5 ML SYRINGE
INTRAMUSCULAR | Status: DC | PRN
  Administered 2020-01-03: 40 mg via INTRAVENOUS

## 2020-01-03 MED ORDER — PROPOFOL 10 MG/ML IV BOLUS
INTRAVENOUS | Status: DC | PRN
  Administered 2020-01-03: 40 mg via INTRAVENOUS
  Administered 2020-01-03: 20 mg via INTRAVENOUS

## 2020-01-03 NOTE — CV Procedure (Signed)
    Electrical Cardioversion Procedure Note Donald Bond [id number] 1960/10/28  Procedure: Electrical Cardioversion Indications:  Atrial Flutter  Time Out: Verified patient identification, verified procedure,medications/allergies/relevent history reviewed, required imaging and test results available.  Performed  Procedure Details  The patient was NPO after midnight. Anesthesia was administered at the beside  by Dr.Hollis with propofol.  Cardioversion was performed with synchronized biphasic defibrillation via AP pads with 75 joules.  1 attempt(s) were performed.  The patient converted to normal sinus rhythm. The patient tolerated the procedure well   IMPRESSION:  Successful cardioversion of atrial flutter. ICD interrogated for verification.     Donald Bond 01/03/2020, 10:41 AM

## 2020-01-03 NOTE — Anesthesia Postprocedure Evaluation (Signed)
Anesthesia Post Note  Patient: Donald Bond  Procedure(s) Performed: CARDIOVERSION (N/A )     Patient location during evaluation: PACU Anesthesia Type: General Level of consciousness: awake and alert Pain management: pain level controlled Vital Signs Assessment: post-procedure vital signs reviewed and stable Respiratory status: spontaneous breathing, nonlabored ventilation, respiratory function stable and patient connected to nasal cannula oxygen Cardiovascular status: blood pressure returned to baseline and stable Postop Assessment: no apparent nausea or vomiting Anesthetic complications: no   No complications documented.  Last Vitals:  Vitals:   01/03/20 1100 01/03/20 1110  BP: 131/85 126/88  Pulse: 63 94  Resp: (!) 23 (!) 21  Temp:    SpO2: 97% 97%    Last Pain:  Vitals:   01/03/20 1110  TempSrc:   PainSc: 0-No pain                 [name]

## 2020-01-03 NOTE — Telephone Encounter (Signed)
Left pt message giving NEW chf appt w/ Mclean 11/17 @ 10:20, asked pt to call back to confirm

## 2020-01-03 NOTE — Discharge Instructions (Signed)
Electrical Cardioversion Electrical cardioversion is the delivery of a jolt of electricity to restore a normal rhythm to the heart. A rhythm that is too fast or is not regular keeps the heart from pumping well. In this procedure, sticky patches or metal paddles are placed on the chest to deliver electricity to the heart from a device. This procedure may be done in an emergency if:  There is low or no blood pressure as a result of the heart rhythm.  Normal rhythm must be restored as fast as possible to protect the brain and heart from further damage.  It may save a life. This may also be a scheduled procedure for irregular or fast heart rhythms that are not immediately life-threatening. TFollow these instructions at home:  Do not drive for 24 hours if you were given a sedative during your procedure.  Take over-the-counter and prescription medicines only as told by your health care provider.  Ask your health care provider how to check your pulse. Check it often.  Rest for 48 hours after the procedure or as told by your health care provider.  Avoid or limit your caffeine use as told by your health care provider.  Keep all follow-up visits as told by your health care provider. This is important. Contact a health care provider if:  You feel like your heart is beating too quickly or your pulse is not regular.  You have a serious muscle cramp that does not go away. Get help right away if:  You have discomfort in your chest.  You are dizzy or you feel faint.  You have trouble breathing or you are short of breath.  Your speech is slurred.  You have trouble moving an arm or leg on one side of your body.  Your fingers or toes turn cold or blue. Summary  Electrical cardioversion is the delivery of a jolt of electricity to restore a normal rhythm to the heart.  This procedure may be done right away in an emergency or may be a scheduled procedure if the condition is not an  emergency.  Generally, this is a safe procedure.  After the procedure, check your pulse often as told by your health care provider.

## 2020-01-03 NOTE — Anesthesia Preprocedure Evaluation (Addendum)
Anesthesia Evaluation  Patient identified by MRN, date of birth, ID band Patient awake    Reviewed: Allergy & Precautions, NPO status , Patient's Chart, lab work & pertinent test results  Airway Mallampati: I  TM Distance: >3 FB Neck ROM: Full    Dental  (+) Teeth Intact   Pulmonary neg pulmonary ROS,    breath sounds clear to auscultation       Cardiovascular hypertension, + dysrhythmias Atrial Fibrillation + Cardiac Defibrillator  Rhythm:Irregular Rate:Abnormal     Neuro/Psych  Headaches, negative psych ROS   GI/Hepatic negative GI ROS, Neg liver ROS,   Endo/Other  negative endocrine ROS  Renal/GU negative Renal ROS     Musculoskeletal negative musculoskeletal ROS (+)   Abdominal Normal abdominal exam  (+)   Peds  Hematology negative hematology ROS (+)   Anesthesia Other Findings   Reproductive/Obstetrics                            Anesthesia Physical Anesthesia Plan  ASA: IV  Anesthesia Plan: General   Post-op Pain Management:    Induction: Intravenous  PONV Risk Score and Plan: 0 and Propofol infusion  Airway Management Planned: Natural Airway and Simple Face Mask  Additional Equipment: None  Intra-op Plan:   Post-operative Plan:   Informed Consent:   Plan Discussed with: CRNA  Anesthesia Plan Comments: (Echo:  1. Severe asymmetic hypertrophy of the basal septum up to 1.8 cm.  Findings consistent with patient's history of hypertrophic cardiomyopathy,  sigmoid variant. No LVOT obstruction or mitral SAM. Left ventricular  ejection fraction, by estimation, is 25 to  30%. The left ventricle has severely decreased function. The left  ventricle demonstrates global hypokinesis. There is severe asymmetric left  ventricular hypertrophy of the basal-septal segment. Left ventricular  diastolic function could not be evaluated.  2. Right ventricular systolic function is  mildly reduced. The right  ventricular size is mildly enlarged. There is moderately elevated  pulmonary artery systolic pressure. The estimated right ventricular  systolic pressure is 60.1 mmHg.  3. Left atrial size was severely dilated.  4. Right atrial size was moderately dilated.  5. Severe mitral regurgitation due to restricted PMVL (IIIB). 2D ERO 0.37  cm2, with R vol 57, RF 58%. The mitral valve is degenerative. Severe  mitral valve regurgitation. No evidence of mitral stenosis.  6. Tricuspid valve regurgitation is moderate to severe.  7. The aortic valve is tricuspid. There is mild thickening of the aortic  valve. Aortic valve regurgitation is mild. No aortic stenosis is present.  8. The inferior vena cava is dilated in size with >50% respiratory  variability, suggesting right atrial pressure of 8 mmHg. )        Anesthesia Quick Evaluation

## 2020-01-03 NOTE — Transfer of Care (Signed)
Immediate Anesthesia Transfer of Care Note  Patient: Donald Bond  Procedure(s) Performed: CARDIOVERSION (N/A )  Patient Location: Endoscopy Unit  Anesthesia Type:General  Level of Consciousness: awake, alert  and oriented  Airway & Oxygen Therapy: Patient Spontanous Breathing  Post-op Assessment: Report given to RN, Post -op Vital signs reviewed and stable and Patient moving all extremities X 4  Post vital signs: Reviewed and stable  Last Vitals:  Vitals Value Taken Time  BP    Temp    Pulse    Resp    SpO2      Last Pain:  Vitals:   01/03/20 0916  TempSrc: Oral  PainSc: 0-No pain         Complications: No complications documented.

## 2020-01-03 NOTE — Interval H&P Note (Signed)
History and Physical Interval Note:  01/03/2020 9:38 AM  Donald Bond  has presented today for surgery, with the diagnosis of A-FIB.  The various methods of treatment have been discussed with the patient and family. After consideration of risks, benefits and other options for treatment, the patient has consented to  Procedure(s): CARDIOVERSION (N/A) as a surgical intervention.  The patient's history has been reviewed, patient examined, no change in status, stable for surgery.  I have reviewed the patient's chart and labs.  Questions were answered to the patient's satisfaction.     [employer]

## 2020-01-05 ENCOUNTER — Encounter (HOSPITAL_COMMUNITY): Payer: Self-pay | Admitting: Cardiology

## 2020-01-08 ENCOUNTER — Telehealth: Payer: Self-pay

## 2020-01-08 NOTE — Telephone Encounter (Signed)
I started a Eliquis PA through covermymeds: Key: [id number]

## 2020-01-10 ENCOUNTER — Other Ambulatory Visit: Payer: Self-pay

## 2020-01-10 ENCOUNTER — Encounter: Payer: Self-pay | Admitting: Student

## 2020-01-10 ENCOUNTER — Ambulatory Visit (INDEPENDENT_AMBULATORY_CARE_PROVIDER_SITE_OTHER): Payer: BC Managed Care – PPO | Admitting: Student

## 2020-01-10 VITALS — BP 104/70 | HR 76 | Ht 67.0 in | Wt 174.6 lb

## 2020-01-10 DIAGNOSIS — I4819 Other persistent atrial fibrillation: Secondary | ICD-10-CM | POA: Diagnosis not present

## 2020-01-10 DIAGNOSIS — I472 Ventricular tachycardia, unspecified: Secondary | ICD-10-CM

## 2020-01-10 DIAGNOSIS — I422 Other hypertrophic cardiomyopathy: Secondary | ICD-10-CM | POA: Diagnosis not present

## 2020-01-10 DIAGNOSIS — Z95810 Presence of automatic (implantable) cardiac defibrillator: Secondary | ICD-10-CM

## 2020-01-10 NOTE — Telephone Encounter (Signed)
**Note De-Identified Aarvi Stotts Obfuscation** Message from [employer]: Clinton Sawyer Key: [id number] Outcome Approved today Your request has been approved Drug Eliquis 5MG  tablets Form [employer] Electronic PA Form (2017 NCPDP)  I have notified [facility] of this approval.

## 2020-01-10 NOTE — Progress Notes (Signed)
Electrophysiology Office Note Date: 01/10/2020  ID:  Donald Bond, DOB 01-09-1961, MRN [id number]  PCP: Medicine, [facility] Primary Cardiologist: No primary care provider on file. Electrophysiologist: Sherryl Manges, MD   CC: Routine ICD follow-up  Donald Bond is a 59 y.o. male seen today for Sherryl Manges, MD for post [facility] follow up.   Pt had recurrent atrial fibrillation. He got himself an AliveCor monitor and persistent atrial fibrillation documented 5/21 and he underwent cardioversion delayed following the death of his parents  Recurrent afib, and at last office added ranolazine. Spontaneous conversion, but then had VT-PM resulting in syncope and ICD shock-- was unable to find literature to implicate ranolazine proarrhythmia so continued the drug - but within 24 hrs, had another syncopal VT event-- admitted and ranolazine washed out without recurrent arrhythmia  Seen by Dr. Graciela Husbands 11/30/19. Atenolol changed to bisoprolol with LV dysfunction.  Seen in clinic 12/26/2019 with intermittent fatigue ? Due to being back in AF.   Since last being seen in our clinic the patient reports doing OK. He is able to work and do all of his daily activities currently without issue. No further syncope. Energy level overall seems stable. He feels like he went back into AF Thursday or Friday based on his [facility]. he denies chest pain, palpitations, PND, orthopnea, nausea, vomiting, dizziness, syncope, edema, weight gain, or early satiety. He has not had ICD shocks.   Device History: Magazine features editor ICD. 3 previous devices, most recent gen change 2018 with AEGIS pouch.   Past Medical History:  Diagnosis Date  . AICD (automatic cardioverter/defibrillator) present X 4  . Headache    "weekly" (06/15/2017)  . History of kidney stones   . Hypertr obst cardiomyop   . ICD--[facility]    Dr. Graciela Husbands  . Orthostatic lightheadedness   . Severe mitral valve  regurgitation by Echo 11/24/19 11/26/2019  . SYNCOPE   . TIREDNESS   . Ventricular tachycardia (HCC)    rx via ICD   Past Surgical History:  Procedure Laterality Date  . CARDIAC CATHETERIZATION    . CARDIAC DEFIBRILLATOR PLACEMENT  ~ 2000; ~ 2010   "I've had 4" (06/15/2017)  . CARDIOVERSION  06/29/2016   Procedure: Cardioversion;  Surgeon: Duke Salvia, MD;  Location: [facility] INVASIVE CV LAB;  Service: Cardiovascular;;  . CARDIOVERSION N/A 06/17/2017   Procedure: CARDIOVERSION;  Surgeon: Jake Bathe, MD;  Location: [facility] ENDOSCOPY;  Service: Cardiovascular;  Laterality: N/A;  . CARDIOVERSION N/A 07/11/2019   Procedure: CARDIOVERSION;  Surgeon: Chrystie Nose, MD;  Location: [facility] ENDOSCOPY;  Service: Cardiovascular;  Laterality: N/A;  . CARDIOVERSION N/A 01/03/2020   Procedure: CARDIOVERSION;  Surgeon: Jake Bathe, MD;  Location: [facility] ENDOSCOPY;  Service: Cardiovascular;  Laterality: N/A;  . Guidant Vitality ICD Impantation--Guidant Vitality T135--11/13/2003    . HYDROCELE EXCISION / REPAIR Left 04/2013  . ICD GENERATOR CHANGEOUT N/A 06/29/2016   Procedure: ICD Generator Changeout;  Surgeon: Duke Salvia, MD;  Location: [facility] INVASIVE CV LAB;  Service: Cardiovascular;  Laterality: N/A;  . LAPAROSCOPIC CHOLECYSTECTOMY  2003  . LITHOTRIPSY  X 1    Current Outpatient Medications  Medication Sig Dispense Refill  . apixaban (ELIQUIS) 5 MG TABS tablet Take 1 tablet (5 mg total) by mouth 2 (two) times daily. 60 tablet 6  . benazepril (LOTENSIN) 5 MG tablet Take 0.5 tablets (2.5 mg total) by mouth daily. 15 tablet 6  . bisoprolol (ZEBETA) 5 MG tablet  TAKE 1 TABLET (5 MG TOTAL) BY MOUTH IN THE MORNING AND AT BEDTIME. 180 tablet 3  . dofetilide (TIKOSYN) 500 MCG capsule Take 1 capsule (500 mcg total) by mouth 2 (two) times daily. 60 capsule 12  . ibuprofen (ADVIL,MOTRIN) 200 MG tablet Take 800 mg by mouth every 4 (four) hours as needed for headache or moderate pain.     Marland Kitchen loratadine (CLARITIN) 10 MG  tablet Take 10 mg by mouth daily as needed for allergies.     . magnesium oxide (MAG-OX) 400 MG tablet Take 1 tablet (400 mg total) by mouth daily. 30 tablet 6   No current facility-administered medications for this visit.    Allergies:   Patient has no known allergies.   Social History: Social History   Socioeconomic History  . Marital status: Married    Spouse name: Not on file  . Number of children: Not on file  . Years of education: Not on file  . Highest education level: Not on file  Occupational History  . Not on file  Tobacco Use  . Smoking status: Never Smoker  . Smokeless tobacco: Former Neurosurgeon    Types: Engineer, drilling  . Vaping Use: Never used  Substance and Sexual Activity  . Alcohol use: Yes    Alcohol/week: 3.0 standard drinks    Types: 3 Cans of beer per week  . Drug use: No  . Sexual activity: Yes  Other Topics Concern  . Not on file  Social History Narrative   Lives with spouse in [place]   employed   Social Determinants of Health   Financial Resource Strain:   . Difficulty of Paying Living Expenses: Not on file  Food Insecurity:   . Worried About Programme researcher, broadcasting/film/video in the Last Year: Not on file  . Ran Out of Food in the Last Year: Not on file  Transportation Needs:   . Lack of Transportation (Medical): Not on file  . Lack of Transportation (Non-Medical): Not on file  Physical Activity:   . Days of Exercise per Week: Not on file  . Minutes of Exercise per Session: Not on file  Stress:   . Feeling of Stress : Not on file  Social Connections:   . Frequency of Communication with Friends and Family: Not on file  . Frequency of Social Gatherings with Friends and Family: Not on file  . Attends Religious Services: Not on file  . Active Member of Clubs or Organizations: Not on file  . Attends Banker Meetings: Not on file  . Marital Status: Not on file  Intimate Partner Violence:   . Fear of Current or Ex-Partner: Not on file  .  Emotionally Abused: Not on file  . Physically Abused: Not on file  . Sexually Abused: Not on file    Family History: Family History  Problem Relation Age of Onset  . ALS Father   . Hypertension Other   . Heart attack Brother     Review of Systems: All other systems reviewed and are otherwise negative except as noted above.   Physical Exam: Vitals:   01/10/20 1208  BP: 104/70  Pulse: 76  SpO2: 99%  Weight: 174 lb 9.6 oz (79.2 kg)  Height: 5\' 7"  (1.702 m)     GEN- The patient is well appearing, alert and oriented x 3 today.   HEENT: normocephalic, atraumatic; sclera clear, conjunctiva pink; hearing intact; oropharynx clear; neck supple, no JVP Lymph- no cervical lymphadenopathy  Lungs- Clear to ausculation bilaterally, normal work of breathing.  No wheezes, rales, rhonchi Heart- Irregularly irregular rate and rhythm, no murmurs, rubs or gallops, PMI not laterally displaced GI- soft, non-tender, non-distended, bowel sounds present, no hepatosplenomegaly Extremities- no clubbing or cyanosis. No edema; DP/PT/radial pulses 2+ bilaterally MS- no significant deformity or atrophy Skin- warm and dry, no rash or lesion; ICD pocket well healed Psych- euthymic mood, full affect Neuro- strength and sensation are intact  ICD interrogation- Previous reports reviewed  EKG:  EKG is ordered today. The ekg ordered today shows AF with controled VR at 76 bpm  Recent Labs: 11/23/2019: ALT 25 11/24/2019: TSH 3.229 12/26/2019: BUN 13; Creatinine, Ser 0.75; Hemoglobin 15.3; Magnesium 2.3; Platelets 215; Potassium 4.3; Sodium 139   Wt Readings from Last 3 Encounters:  01/10/20 174 lb 9.6 oz (79.2 kg)  01/03/20 178 lb (80.7 kg)  12/26/19 178 lb (80.7 kg)     Other studies Reviewed: Additional studies/ records that were reviewed today include: Previous EP office notes. Post [facility] EKG  Assessment and Plan:  1.  HCM with significant LV dysfunction s/p Boston Scientific single chamber ICD   Echo 11/24/2019 LVEF 25-30% Continue bisoprolol 5 mg daily Continue benazepril. We have discussed switching to losartan with a goal of getting on to [facility]. He has had hyperkalemia on this in the past. He has been referred to HF clinic to discuss further, as he may need Lokelma or similar to be successful on Entresto. He will be seen next week.  Review driving recommendations. No driving x6 months after appropriate therapy. Sees Dr. Shirlee Latch next week to establish in HF clinic  2. Paroxysmal Atrial fibrillation  Continue tikosyn 500 mg BID. QTc has been stable.  EKG today shows return of AF after [facility] last week. Will discuss with Dr. Graciela Husbands.  Have previously discussed ablation 07/2017 with Dr. Johney Frame. Thought to have very low likelihood of success 2/2 atriopathy.  VP < 1%. ? If CRT upgrade +/- AV nodal ablation would be an option in the future. (Per Dr. Koren Bound notes, would prefer revisiting ablation or seeing Dr. Berneice Gandy for ? "hybrid ablation" prior to consideration of AVN ablation.) In shared decision making, the patient would like to see Dr. Shirlee Latch next week, and have he and Dr. Graciela Husbands come up with a plan together.   3. VT/VF This occurred and recurred in the setting of ranolazine use + tikosyn, although we couldn't find significant literature citing ranolazine and proarrhythmia; He has not had further sustained VT off ranolazine.  Current medicines are reviewed at length with the patient today.   The patient does not have concerns regarding his medicines.  The following changes were made today: None  Disposition:   Follow up with Dr. Graciela Husbands as scheduled. He sees Dr. Shirlee Latch to establish next week.   Dustin Flock, PA-C  01/10/2020 1:15 PM  [facility] [address] [place] [address] [phone] (office) [phone] (fax)

## 2020-01-10 NOTE — Patient Instructions (Signed)
Medication Instructions:  *If you need a refill on your cardiac medications before your next appointment, please call your pharmacy*    Follow-Up: At [facility], you and your health needs are our priority.  As part of our continuing mission to provide you with exceptional heart care, we have created designated Provider Care Teams.  These Care Teams include your primary Cardiologist (physician) and Advanced Practice Providers (APPs -  Physician Assistants and Nurse Practitioners) who all work together to provide you with the care you need, when you need it.  We recommend signing up for the patient portal called "MyChart".  Sign up information is provided on this After Visit Summary.  MyChart is used to connect with patients for Virtual Visits (Telemedicine).  Patients are able to view lab/test results, encounter notes, upcoming appointments, etc.  Non-urgent messages can be sent to your provider as well.   To learn more about what you can do with MyChart, go to [email or web address].    Your next appointment:   Your physician recommends that you keep your scheduled follow-up appointment with Dr. Graciela Husbands  The format for your next appointment:   In Person with Sherryl Manges, MD

## 2020-01-11 NOTE — Addendum Note (Signed)
Addended by: Rebbeca Paul C on: 01/11/2020 11:18 AM   Modules accepted: Orders

## 2020-01-12 ENCOUNTER — Telehealth: Payer: Self-pay | Admitting: Internal Medicine

## 2020-01-12 DIAGNOSIS — Z0279 Encounter for issue of other medical certificate: Secondary | ICD-10-CM

## 2020-01-12 NOTE — Telephone Encounter (Signed)
Medical Records Received FMLA paperwork for Dr. Graciela Husbands to complete.Patient made $29 payment and Signed release Form. 01/12/20. Paperwork placed in Dr. Odessa Fleming box

## 2020-01-17 ENCOUNTER — Telehealth (HOSPITAL_COMMUNITY): Payer: Self-pay | Admitting: Pharmacy Technician

## 2020-01-17 ENCOUNTER — Other Ambulatory Visit: Payer: Self-pay

## 2020-01-17 ENCOUNTER — Other Ambulatory Visit (HOSPITAL_COMMUNITY): Payer: Self-pay | Admitting: *Deleted

## 2020-01-17 ENCOUNTER — Encounter (HOSPITAL_COMMUNITY): Payer: Self-pay | Admitting: Cardiology

## 2020-01-17 ENCOUNTER — Ambulatory Visit (HOSPITAL_COMMUNITY)
Admit: 2020-01-17 | Discharge: 2020-01-17 | Disposition: A | Discharge status: Home / Self Care | Payer: BC Managed Care – PPO | Attending: Cardiology | Admitting: Cardiology

## 2020-01-17 VITALS — BP 108/68 | HR 74 | Ht 67.0 in | Wt 174.4 lb

## 2020-01-17 DIAGNOSIS — Z7901 Long term (current) use of anticoagulants: Secondary | ICD-10-CM | POA: Insufficient documentation

## 2020-01-17 DIAGNOSIS — I4819 Other persistent atrial fibrillation: Secondary | ICD-10-CM

## 2020-01-17 DIAGNOSIS — I472 Ventricular tachycardia: Secondary | ICD-10-CM | POA: Insufficient documentation

## 2020-01-17 DIAGNOSIS — I422 Other hypertrophic cardiomyopathy: Secondary | ICD-10-CM | POA: Diagnosis not present

## 2020-01-17 DIAGNOSIS — I480 Paroxysmal atrial fibrillation: Secondary | ICD-10-CM | POA: Diagnosis not present

## 2020-01-17 DIAGNOSIS — I5022 Chronic systolic (congestive) heart failure: Secondary | ICD-10-CM

## 2020-01-17 DIAGNOSIS — Z79899 Other long term (current) drug therapy: Secondary | ICD-10-CM | POA: Diagnosis not present

## 2020-01-17 DIAGNOSIS — Z7984 Long term (current) use of oral hypoglycemic drugs: Secondary | ICD-10-CM | POA: Diagnosis not present

## 2020-01-17 DIAGNOSIS — I340 Nonrheumatic mitral (valve) insufficiency: Secondary | ICD-10-CM | POA: Diagnosis not present

## 2020-01-17 LAB — BASIC METABOLIC PANEL
Anion gap: 6 (ref 5–15)
BUN: 13 mg/dL (ref 6–20)
CO2: 26 mmol/L (ref 22–32)
Calcium: 9.8 mg/dL (ref 8.9–10.3)
Chloride: 105 mmol/L (ref 98–111)
Creatinine, Ser: 0.9 mg/dL (ref 0.61–1.24)
GFR, Estimated: >60 mL/min (ref >60)
Glucose, Bld: 94 mg/dL (ref 70–99)
Potassium: 5.1 mmol/L (ref 3.5–5.1)
Sodium: 137 mmol/L (ref 135–145)

## 2020-01-17 LAB — BRAIN NATRIURETIC PEPTIDE: B Natriuretic Peptide: 461.3 pg/mL — ABNORMAL HIGH (ref 0.0–100.0)

## 2020-01-17 MED ORDER — ENTRESTO 24-26 MG PO TABS
1.0000 | ORAL_TABLET | Freq: Two times a day (BID) | ORAL | 3 refills | Status: DC
Start: 2020-01-17 — End: 2020-01-17

## 2020-01-17 MED ORDER — EMPAGLIFLOZIN 10 MG PO TABS
10.0000 mg | ORAL_TABLET | Freq: Every day | ORAL | 3 refills | Status: DC
Start: 2020-01-17 — End: 2021-01-09

## 2020-01-17 MED ORDER — APIXABAN 5 MG PO TABS
5.0000 mg | ORAL_TABLET | Freq: Two times a day (BID) | ORAL | 6 refills | Status: DC
Start: 2020-01-17 — End: 2020-02-14

## 2020-01-17 MED ORDER — ENTRESTO 24-26 MG PO TABS
1.0000 | ORAL_TABLET | Freq: Two times a day (BID) | ORAL | 3 refills | Status: DC
Start: 2020-01-17 — End: 2020-02-28

## 2020-01-17 MED ORDER — DAPAGLIFLOZIN PROPANEDIOL 10 MG PO TABS
10.0000 mg | ORAL_TABLET | Freq: Every day | ORAL | 3 refills | Status: DC
Start: 2020-01-17 — End: 2020-01-17

## 2020-01-17 MED ORDER — EMPAGLIFLOZIN 10 MG PO TABS: 10.0000 mg | ORAL_TABLET | Freq: Every day | ORAL | 3 refills | Status: DC

## 2020-01-17 NOTE — Progress Notes (Signed)
PCP: Medicine, [facility] EP: Dr. Graciela Husbands HF Cardiology: Dr. Shirlee Latch  59 y.o. with hypertrophic nonobstructive cardiomyopathy (gene+) was referred by Dr. Graciela Husbands for evaluation of newly noted systolic dysfunction.  Patient has HCM as evidenced by severe asymmetric septal hypertrophy.  He does not have LVOT obstruction or SAM.  His brother had SCD at 3, presumable due to HCM, and patient has a Environmental manager ICD.  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  Patient has struggled with atrial fibrillation.  He was seen by Dr. Johney Frame a couple of years ago and decided against AF ablation.  He is currently maintained on Tikosyn.  He was on ranolazone + Tikosyn but developed VT x 2 on this combination and ranolazine was stopped.  Most recently, had DCCV 01/03/20.  He is now back in atrial fibrillation.  He feels the atrial fibrillation when his rate gets fast (>100 bpm).  Today, rate is controlled in the 70s.  Echo in 4/18 showed EF 55-60%, but most recent echo in 9/21 showed EF down to 25-30% with moderate-severe MR (no SAM).   He has been under a lot of stress due to the deaths of his parents this summer.  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  He does not get short of breath with exertion unless his heart rate is running high.  Generally, rate is controlled but sometimes, it gets up to the 100s-110s range.  No chest pain.  No lightheadedness.  No orthopnea/PND.    ECG (personally reviewed): atrial fibrillation, LBBB (142 msec), QTc 518 msec  Labs (10/21): K 4.3, creatinine 0.75  PMH: 1. Atrial fibrillation: Paroxysmal.  Was on ranolazine + Tikosyn but had VT, ranolazine stopped.  Now on Tikosyn.  - Most recent DCCV 11/21. 2. Hypertrophic cardiomyopathy: No LVOT obstruction or SAM.  Gene+.  Brother with SCD at 41.  - [employer] ICD - Echo (4/18): Severe  asymmetric septal hypertrophy, no SAM, moderate MR, EF 55-60%.  - Echo (9/21): Severe asymmetric septal hypertrophy, no SAM or LVOT obstruction, EF 25-30%, mildly decreased RV systolic function, PASP 60 mmHg, severe LAE, severe MR with restricted posterior leaflet.  3. Chronic systolic CHF: Echo (9/21): Severe asymmetric septal hypertrophy, no SAM or LVOT obstruction, EF 25-30%, mildly decreased RV systolic function, PASP 60 mmHg, severe LAE, severe MR with restricted posterior leaflet.  4. VT: 9/21, suspected to be due to ranolazine + Tikosyn combination, ranolazine stopped.  5. H/o nephrolithiasis.  6. Mitral regurgitation: Moderate to severe by my review of 9/21 echo,  restriction of posterior leaflet.  7. LBBB  FH: Mother with atrial fibrillation.  Brother with SCD at 48.  Son also with HCM gene+.   Social History   Socioeconomic History  . Marital status: Married    Spouse name: Not on file  . Number of children: Not on file  . Years of education: Not on file  . Highest education level: Not on file  Occupational History  . Not on file  Tobacco Use  . Smoking status: Never Smoker  . Smokeless tobacco: Former Neurosurgeon    Types: Engineer, drilling  . Vaping Use: Never used  Substance and Sexual Activity  . Alcohol use: Yes    Alcohol/week: 3.0 standard drinks    Types: 3 Cans of beer per week  . Drug use: No  . Sexual activity: Yes  Other  Topics Concern  . Not on file  Social History Narrative   Lives with spouse in [place]   employed   Social Determinants of Health   Financial Resource Strain:   . Difficulty of Paying Living Expenses: Not on file  Food Insecurity:   . Worried About Programme researcher, broadcasting/film/video in the Last Year: Not on file  . Ran Out of Food in the Last Year: Not on file  Transportation Needs:   . Lack of Transportation (Medical): Not on file  . Lack of Transportation (Non-Medical): Not on file  Physical Activity:   . Days of Exercise per Week: Not on file   . Minutes of Exercise per Session: Not on file  Stress:   . Feeling of Stress : Not on file  Social Connections:   . Frequency of Communication with Friends and Family: Not on file  . Frequency of Social Gatherings with Friends and Family: Not on file  . Attends Religious Services: Not on file  . Active Member of Clubs or Organizations: Not on file  . Attends Banker Meetings: Not on file  . Marital Status: Not on file  Intimate Partner Violence:   . Fear of Current or Ex-Partner: Not on file  . Emotionally Abused: Not on file  . Physically Abused: Not on file  . Sexually Abused: Not on file   ROS: All systems reviewed and negative except as per HPI.   Current Outpatient Medications  Medication Sig Dispense Refill  . bisoprolol (ZEBETA) 5 MG tablet TAKE 1 TABLET (5 MG TOTAL) BY MOUTH IN THE MORNING AND AT BEDTIME. 180 tablet 3  . dofetilide (TIKOSYN) 500 MCG capsule Take 1 capsule (500 mcg total) by mouth 2 (two) times daily. 60 capsule 12  . loratadine (CLARITIN) 10 MG tablet Take 10 mg by mouth daily as needed for allergies.     . magnesium oxide (MAG-OX) 400 MG tablet Take 1 tablet (400 mg total) by mouth daily. 30 tablet 6  . apixaban (ELIQUIS) 5 MG TABS tablet Take 1 tablet (5 mg total) by mouth 2 (two) times daily. 180 tablet 6  . empagliflozin (JARDIANCE) 10 MG TABS tablet Take 1 tablet (10 mg total) by mouth daily before breakfast. 90 tablet 3  . sacubitril-valsartan (ENTRESTO) 24-26 MG Take 1 tablet by mouth 2 (two) times daily. 180 tablet 3   No current facility-administered medications for this encounter.   BP 108/68   Pulse 74   Ht 5\' 7"  (1.702 m)   Wt 79.1 kg (174 lb 6.4 oz)   SpO2 98%   BMI 27.31 kg/m  General: NAD Neck: No JVD, no thyromegaly or thyroid nodule.  Lungs: Clear to auscultation bilaterally with normal respiratory effort. CV: Nondisplaced PMI.  Heart regular S1/S2, no S3/S4, 2/6 HSM apex.  No peripheral edema.  No carotid bruit.   Normal pedal pulses.  Abdomen: Soft, nontender, no hepatosplenomegaly, no distention.  Skin: Intact without lesions or rashes.  Neurologic: Alert and oriented x 3.  Psych: Normal affect. Extremities: No clubbing or cyanosis.  HEENT: Normal.   Assessment/Plan: 1. Chronic systolic CHF: Most recent echo in 9/21 showed severe asymmetric septal hypertrophy, no SAM or LVOT obstruction, EF 25-30%, mildly decreased RV systolic function, PASP 60 mmHg, severe LAE, severe MR with restricted posterior leaflet.  EF was lower than in the past (normal in 4/18) and mitral regurgitation was worse.  Uncertain why his EF has fallen.  [employer] ICD.  HR seems to be  generally controlled in AF though he does have occasional RVR which may contribute to fall in EF. He has a chronic LBBB which may contribute (though not markedly wide at 142 msec today). It could be natural history of HCM with worsening of function over time.  Finally, cannot rule out CAD (though no chest pain).  On exam, he is not volume overloaded.  NYHA class II symptoms.  - Continue bisoprolol 5 mg bid.  - Stop benazepril and after 36 hrs, will start Entresto 24/26 bid.  - Will also start dapagliflozin 10 mg daily.  This may help him tolerate Entresto better (will tend to bring K down some, and he has had some issues with K elevation in the past).  - I do not think he will need Lasix right now.  - I would like to arrange RHC/LHC to assess filling pressures and also to assess for CAD as cause of fall in EF. We discussed risks/benefits and he agrees to procedure.  Will hold Eliquis the day before and the day of procedure.  - He may benefit from device upgrade to CRT given LBBB.  Could consider this in conjunction with AV nodal ablation (see below discussion under atrial fibrillation).  - BMET/BNP today, repeat BMET 10 days.  - If device is compatible, would consider cardiac MRI in the future.  2. Atrial fibrillation: Paroxysmal => persistent.  He  had DCCV 01/03/20 but is back in atrial fibrillation now despite Tikosyn use.  He is off ranolazine due to VT with combination of Tikosyn and ranolazine.  Rate is fast at times though in the 70s in the office today.  Tachy-mediated CMP could contribute to low EF, but as noted his HR seems to be < 100 bpm most of the time. Atrial fibrillation ablation likely has a lower chance of success here due to the size of the left atrium (severely enlarged).  He feels worse when he is in afib and HR is > 100.  - Continue bisoprolol 5 mg bid for rate control.  - Continue apixaban 5 mg bid.  - For now, will continue Tikosyn.  - Will need to discuss options for AF control with Dr. Klein.  There appear to be 3 possible choices: classic AF ablation (though chance of success less due to severe LAE) versus AV nodal ablation and BiV pacing versus consideration for surgical Maze with MV repair (at least moderate-severe MR).  3. Mitral regurgitation: Moderate-severe (3+) on recent echo, worse than prior. He does not have SAM, his posterior leaflet appears restricted (probably mixed MR).   Low EF puts him at higher risk with repair.  - I would like to more closely assess MR with TEE, will do this on the same day as cath.  - If MR is 3-4+, will need to consider options for repair.  If we go the route of AV nodal ablation and BiV pacing or atrial fibrillation ablation, Mitraclip may be a good option here.  However, surgical Maze with MV repair would also be a potential option as well.  Will see what TEE/cath shows and discuss with Dr. Klein.  4. Hypertrophic cardiomyopathy: No SAM or LVOT obstruction, has asymmetric septal hypertrophy.  Gene+, brother with SCD at 42.   - When he returns, he is going to bring us his copy of the genetic testing results just to make a record of it.  5. VT: In 9/21, suspect due to combination of ranolazone and Tikosyn with QT prolongation.  Now off   ranolazine.   Marca Ancona 01/17/2020 5:13 PM

## 2020-01-17 NOTE — Patient Instructions (Signed)
STOP Benazepril  START entresto 24/26mg  twice daily 36 hours stopping benazepril  START farxiga 10mg  daily  Routine lab work today. Will notify you of abnormal results  Your provider requests you have a right/left heart cath and TEE. (please see instruction sheet)  Follow up in 1 month with Dr.McLean

## 2020-01-17 NOTE — H&P (View-Only) (Signed)
PCP: Medicine, [facility] EP: Dr. Graciela Husbands HF Cardiology: Dr. Shirlee Latch  59 y.o. with hypertrophic nonobstructive cardiomyopathy (gene+) was referred by Dr. Graciela Husbands for evaluation of newly noted systolic dysfunction.  Patient has HCM as evidenced by severe asymmetric septal hypertrophy.  He does not have LVOT obstruction or SAM.  His brother had SCD at 3, presumable due to HCM, and patient has a Environmental manager ICD.  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  Patient has struggled with atrial fibrillation.  He was seen by Dr. Johney Frame a couple of years ago and decided against AF ablation.  He is currently maintained on Tikosyn.  He was on ranolazone + Tikosyn but developed VT x 2 on this combination and ranolazine was stopped.  Most recently, had DCCV 01/03/20.  He is now back in atrial fibrillation.  He feels the atrial fibrillation when his rate gets fast (>100 bpm).  Today, rate is controlled in the 70s.  Echo in 4/18 showed EF 55-60%, but most recent echo in 9/21 showed EF down to 25-30% with moderate-severe MR (no SAM).   He has been under a lot of stress due to the deaths of his parents this summer.  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  He does not get short of breath with exertion unless his heart rate is running high.  Generally, rate is controlled but sometimes, it gets up to the 100s-110s range.  No chest pain.  No lightheadedness.  No orthopnea/PND.    ECG (personally reviewed): atrial fibrillation, LBBB (142 msec), QTc 518 msec  Labs (10/21): K 4.3, creatinine 0.75  PMH: 1. Atrial fibrillation: Paroxysmal.  Was on ranolazine + Tikosyn but had VT, ranolazine stopped.  Now on Tikosyn.  - Most recent DCCV 11/21. 2. Hypertrophic cardiomyopathy: No LVOT obstruction or SAM.  Gene+.  Brother with SCD at 41.  - [employer] ICD - Echo (4/18): Severe  asymmetric septal hypertrophy, no SAM, moderate MR, EF 55-60%.  - Echo (9/21): Severe asymmetric septal hypertrophy, no SAM or LVOT obstruction, EF 25-30%, mildly decreased RV systolic function, PASP 60 mmHg, severe LAE, severe MR with restricted posterior leaflet.  3. Chronic systolic CHF: Echo (9/21): Severe asymmetric septal hypertrophy, no SAM or LVOT obstruction, EF 25-30%, mildly decreased RV systolic function, PASP 60 mmHg, severe LAE, severe MR with restricted posterior leaflet.  4. VT: 9/21, suspected to be due to ranolazine + Tikosyn combination, ranolazine stopped.  5. H/o nephrolithiasis.  6. Mitral regurgitation: Moderate to severe by my review of 9/21 echo,  restriction of posterior leaflet.  7. LBBB  FH: Mother with atrial fibrillation.  Brother with SCD at 48.  Son also with HCM gene+.   Social History   Socioeconomic History  . Marital status: Married    Spouse name: Not on file  . Number of children: Not on file  . Years of education: Not on file  . Highest education level: Not on file  Occupational History  . Not on file  Tobacco Use  . Smoking status: Never Smoker  . Smokeless tobacco: Former Neurosurgeon    Types: Engineer, drilling  . Vaping Use: Never used  Substance and Sexual Activity  . Alcohol use: Yes    Alcohol/week: 3.0 standard drinks    Types: 3 Cans of beer per week  . Drug use: No  . Sexual activity: Yes  Other  Topics Concern  . Not on file  Social History Narrative   Lives with spouse in [place]   employed   Social Determinants of Health   Financial Resource Strain:   . Difficulty of Paying Living Expenses: Not on file  Food Insecurity:   . Worried About Programme researcher, broadcasting/film/video in the Last Year: Not on file  . Ran Out of Food in the Last Year: Not on file  Transportation Needs:   . Lack of Transportation (Medical): Not on file  . Lack of Transportation (Non-Medical): Not on file  Physical Activity:   . Days of Exercise per Week: Not on file   . Minutes of Exercise per Session: Not on file  Stress:   . Feeling of Stress : Not on file  Social Connections:   . Frequency of Communication with Friends and Family: Not on file  . Frequency of Social Gatherings with Friends and Family: Not on file  . Attends Religious Services: Not on file  . Active Member of Clubs or Organizations: Not on file  . Attends Banker Meetings: Not on file  . Marital Status: Not on file  Intimate Partner Violence:   . Fear of Current or Ex-Partner: Not on file  . Emotionally Abused: Not on file  . Physically Abused: Not on file  . Sexually Abused: Not on file   ROS: All systems reviewed and negative except as per HPI.   Current Outpatient Medications  Medication Sig Dispense Refill  . bisoprolol (ZEBETA) 5 MG tablet TAKE 1 TABLET (5 MG TOTAL) BY MOUTH IN THE MORNING AND AT BEDTIME. 180 tablet 3  . dofetilide (TIKOSYN) 500 MCG capsule Take 1 capsule (500 mcg total) by mouth 2 (two) times daily. 60 capsule 12  . loratadine (CLARITIN) 10 MG tablet Take 10 mg by mouth daily as needed for allergies.     . magnesium oxide (MAG-OX) 400 MG tablet Take 1 tablet (400 mg total) by mouth daily. 30 tablet 6  . apixaban (ELIQUIS) 5 MG TABS tablet Take 1 tablet (5 mg total) by mouth 2 (two) times daily. 180 tablet 6  . empagliflozin (JARDIANCE) 10 MG TABS tablet Take 1 tablet (10 mg total) by mouth daily before breakfast. 90 tablet 3  . sacubitril-valsartan (ENTRESTO) 24-26 MG Take 1 tablet by mouth 2 (two) times daily. 180 tablet 3   No current facility-administered medications for this encounter.   BP 108/68   Pulse 74   Ht 5\' 7"  (1.702 m)   Wt 79.1 kg (174 lb 6.4 oz)   SpO2 98%   BMI 27.31 kg/m  General: NAD Neck: No JVD, no thyromegaly or thyroid nodule.  Lungs: Clear to auscultation bilaterally with normal respiratory effort. CV: Nondisplaced PMI.  Heart regular S1/S2, no S3/S4, 2/6 HSM apex.  No peripheral edema.  No carotid bruit.   Normal pedal pulses.  Abdomen: Soft, nontender, no hepatosplenomegaly, no distention.  Skin: Intact without lesions or rashes.  Neurologic: Alert and oriented x 3.  Psych: Normal affect. Extremities: No clubbing or cyanosis.  HEENT: Normal.   Assessment/Plan: 1. Chronic systolic CHF: Most recent echo in 9/21 showed severe asymmetric septal hypertrophy, no SAM or LVOT obstruction, EF 25-30%, mildly decreased RV systolic function, PASP 60 mmHg, severe LAE, severe MR with restricted posterior leaflet.  EF was lower than in the past (normal in 4/18) and mitral regurgitation was worse.  Uncertain why his EF has fallen.  [employer] ICD.  HR seems to be  generally controlled in AF though he does have occasional RVR which may contribute to fall in EF. He has a chronic LBBB which may contribute (though not markedly wide at 142 msec today). It could be natural history of HCM with worsening of function over time.  Finally, cannot rule out CAD (though no chest pain).  On exam, he is not volume overloaded.  NYHA class II symptoms.  - Continue bisoprolol 5 mg bid.  - Stop benazepril and after 36 hrs, will start Entresto 24/26 bid.  - Will also start dapagliflozin 10 mg daily.  This may help him tolerate Entresto better (will tend to bring K down some, and he has had some issues with K elevation in the past).  - I do not think he will need Lasix right now.  - I would like to arrange RHC/LHC to assess filling pressures and also to assess for CAD as cause of fall in EF. We discussed risks/benefits and he agrees to procedure.  Will hold Eliquis the day before and the day of procedure.  - He may benefit from device upgrade to CRT given LBBB.  Could consider this in conjunction with AV nodal ablation (see below discussion under atrial fibrillation).  - BMET/BNP today, repeat BMET 10 days.  - If device is compatible, would consider cardiac MRI in the future.  2. Atrial fibrillation: Paroxysmal => persistent.  He  had DCCV 01/03/20 but is back in atrial fibrillation now despite Tikosyn use.  He is off ranolazine due to VT with combination of Tikosyn and ranolazine.  Rate is fast at times though in the 70s in the office today.  Tachy-mediated CMP could contribute to low EF, but as noted his HR seems to be < 100 bpm most of the time. Atrial fibrillation ablation likely has a lower chance of success here due to the size of the left atrium (severely enlarged).  He feels worse when he is in afib and HR is > 100.  - Continue bisoprolol 5 mg bid for rate control.  - Continue apixaban 5 mg bid.  - For now, will continue Tikosyn.  - Will need to discuss options for AF control with Dr. Graciela Husbands.  There appear to be 3 possible choices: classic AF ablation (though chance of success less due to severe LAE) versus AV nodal ablation and BiV pacing versus consideration for surgical Maze with MV repair (at least moderate-severe MR).  3. Mitral regurgitation: Moderate-severe (3+) on recent echo, worse than prior. He does not have SAM, his posterior leaflet appears restricted (probably mixed MR).   Low EF puts him at higher risk with repair.  - I would like to more closely assess MR with TEE, will do this on the same day as cath.  - If MR is 3-4+, will need to consider options for repair.  If we go the route of AV nodal ablation and BiV pacing or atrial fibrillation ablation, Mitraclip may be a good option here.  However, surgical Maze with MV repair would also be a potential option as well.  Will see what TEE/cath shows and discuss with Dr. Graciela Husbands.  4. Hypertrophic cardiomyopathy: No SAM or LVOT obstruction, has asymmetric septal hypertrophy.  Gene+, brother with SCD at 45.   - When he returns, he is going to bring [place] his copy of the genetic testing results just to make a record of it.  5. VT: In 9/21, suspect due to combination of ranolazone and Tikosyn with QT prolongation.  Now off  ranolazine.   Marca Ancona 01/17/2020 5:13 PM

## 2020-01-17 NOTE — Telephone Encounter (Signed)
Patient was started on Farxiga today, his insurance prefers [place].   Was able to get him a $10 co-pay card. His current 90 day copay was about $45   ID: [id number] RxBIN: [id number]  RxPCN: [facility]  RxGRP: [id number]   Upon investigation, patient is eligible for [facility] co-pay card as well. Current Entresto co-pay $30 for 30 days, $90 for 90 days.  Entresto $10 Co-pay Card BIN [id number] PCN [facility] [id number] ID [id number]  The patient already has an activated Eliquis co-pay card. Called pharmacy and provided the 2 co-pay cards and confirmed that they had an Eliquis card on file.  Called and updated patient.  Archer Asa, CPhT

## 2020-01-22 NOTE — Telephone Encounter (Signed)
MR received a copy of Completed FMLA paperwork. Patient was called. He had received the completed paperwork on 01/19/20 by Dr. Odessa Fleming nurse.

## 2020-02-02 ENCOUNTER — Ambulatory Visit (INDEPENDENT_AMBULATORY_CARE_PROVIDER_SITE_OTHER): Payer: BC Managed Care – PPO

## 2020-02-02 DIAGNOSIS — I5022 Chronic systolic (congestive) heart failure: Secondary | ICD-10-CM

## 2020-02-02 DIAGNOSIS — I422 Other hypertrophic cardiomyopathy: Secondary | ICD-10-CM | POA: Diagnosis not present

## 2020-02-04 LAB — CUP PACEART REMOTE DEVICE CHECK
Battery Remaining Longevity: 144 mo
Battery Remaining Percentage: 100 %
Brady Statistic RV Percent Paced: 0 %
Date Time Interrogation Session: 20211203040100
HighPow Impedance: 50 Ohm
Implantable Lead Implant Date: 20000815
Implantable Lead Location: 753860
Implantable Lead Model: 144
Implantable Lead Serial Number: 334759
Implantable Pulse Generator Implant Date: 20180430
Lead Channel Impedance Value: 840 Ohm
Lead Channel Pacing Threshold Amplitude: 1 V
Lead Channel Pacing Threshold Pulse Width: 0.4 ms
Lead Channel Setting Pacing Amplitude: 2.5 V
Lead Channel Setting Pacing Pulse Width: 0.4 ms
Lead Channel Setting Sensing Sensitivity: 0.5 mV
Pulse Gen Serial Number: 198978

## 2020-02-06 ENCOUNTER — Other Ambulatory Visit: Payer: Self-pay

## 2020-02-06 ENCOUNTER — Encounter: Payer: Self-pay | Admitting: Internal Medicine

## 2020-02-06 ENCOUNTER — Ambulatory Visit (INDEPENDENT_AMBULATORY_CARE_PROVIDER_SITE_OTHER): Payer: BC Managed Care – PPO | Admitting: Internal Medicine

## 2020-02-06 VITALS — BP 92/64 | HR 67 | Ht 67.0 in | Wt 175.8 lb

## 2020-02-06 DIAGNOSIS — I472 Ventricular tachycardia, unspecified: Secondary | ICD-10-CM

## 2020-02-06 DIAGNOSIS — I422 Other hypertrophic cardiomyopathy: Secondary | ICD-10-CM

## 2020-02-06 DIAGNOSIS — I480 Paroxysmal atrial fibrillation: Secondary | ICD-10-CM

## 2020-02-06 DIAGNOSIS — Z95810 Presence of automatic (implantable) cardiac defibrillator: Secondary | ICD-10-CM

## 2020-02-06 MED ORDER — DIGOXIN 125 MCG PO TABS: 0.1250 mg | ORAL_TABLET | Freq: Every day | ORAL | 3 refills | Status: DC

## 2020-02-06 NOTE — Progress Notes (Signed)
Patient Care Team: Medicine, [facility] as PCP - General (Family Medicine) Duke Salvia, MD as PCP - Electrophysiology (Cardiology)   HPI  Donald Bond is a 59 y.o. male Seen in followup for HCM associated with syncope and previously treated ventricular tachycardia;  He has had 3 previous devices.  Underwent gen change #4  2018 w AEGIS pouch support.   He has persistent afib; on apixoban . He underwent cardioversion at the time of his device change   He has had problems with Afib and RVR with reaching into (VF zone > 240 bpm)  Atenolol was increased   Unfortunately he has continued to have episodes detected by his device > 210/240  Dofetilide initiated 4/19; saw JA 6/19 for consideration of catheter ablation>> declined  Recurrent atrial fibrillation.  He got himself an AliveCor monitor and persistent atrial fibrillation documented 5/21 and he underwent cardioversion delayed following the death of his parents   An attempt to augment the benefits of dofetilide, we tried closing.  He ended up with proarrhythmia  He is also seen Dr. DM and he outlined 3 options that we had considered but he and I have not discussed and that is AF ablation/hybrid, AV ablation and CRT upgrade as well as surgical maze with mitral valve repair for his moderate-severe MR  Over the last few weeks he has felt much better,  Sob by the end of the day but not significantly dyspneic and no palpitations  He is scheduled for TEE/catheterization 12/15.      DATE TEST EF   4/18 Echo Normal  LAE (49/2.4/70)  9/21 Echo  25-30% MR severe TR mod-sev (RVE)           Date Cr  K Mg Hgb  9/18 0.95 4.3  15.5  4/19  0.89 4.3 2.1 16.6  9/19 0.99 4.2 2.2   2/20 0.91 4.5  45.3 (Hct)  10/20 0.93 4.5 2.2   7/21 1.19 5.4 2.3 16.2   11/21 0.9 5.1 2.3 15.3    Family >> Gene Screened Son [name] + ( in school) [name] - ( struggling not in school)   Past Medical  History:  Diagnosis Date  . AICD (automatic cardioverter/defibrillator) present X 4  . Headache    "weekly" (06/15/2017)  . History of kidney stones   . Hypertr obst cardiomyop   . ICD--[facility]    Dr. Graciela Husbands  . Orthostatic lightheadedness   . Severe mitral valve regurgitation by Echo 11/24/19 11/26/2019  . SYNCOPE   . TIREDNESS   . Ventricular tachycardia (HCC)    rx via ICD    Past Surgical History:  Procedure Laterality Date  . CARDIAC CATHETERIZATION    . CARDIAC DEFIBRILLATOR PLACEMENT  ~ 2000; ~ 2010   "I've had 4" (06/15/2017)  . CARDIOVERSION  06/29/2016   Procedure: Cardioversion;  Surgeon: Duke Salvia, MD;  Location: [facility] INVASIVE CV LAB;  Service: Cardiovascular;;  . CARDIOVERSION N/A 06/17/2017   Procedure: CARDIOVERSION;  Surgeon: Jake Bathe, MD;  Location: [facility] ENDOSCOPY;  Service: Cardiovascular;  Laterality: N/A;  . CARDIOVERSION N/A 07/11/2019   Procedure: CARDIOVERSION;  Surgeon: Chrystie Nose, MD;  Location: [facility] ENDOSCOPY;  Service: Cardiovascular;  Laterality: N/A;  . CARDIOVERSION N/A 01/03/2020   Procedure: CARDIOVERSION;  Surgeon: Jake Bathe, MD;  Location: [facility] ENDOSCOPY;  Service: Cardiovascular;  Laterality: N/A;  . Guidant Vitality ICD Impantation--Guidant Vitality T135--11/13/2003    . HYDROCELE EXCISION / REPAIR  Left 04/2013  . ICD GENERATOR CHANGEOUT N/A 06/29/2016   Procedure: ICD Generator Changeout;  Surgeon: Duke Salvia, MD;  Location: [facility] INVASIVE CV LAB;  Service: Cardiovascular;  Laterality: N/A;  . LAPAROSCOPIC CHOLECYSTECTOMY  2003  . LITHOTRIPSY  X 1    Current Outpatient Medications  Medication Sig Dispense Refill  . apixaban (ELIQUIS) 5 MG TABS tablet Take 1 tablet (5 mg total) by mouth 2 (two) times daily. 180 tablet 6  . bisoprolol (ZEBETA) 5 MG tablet TAKE 1 TABLET (5 MG TOTAL) BY MOUTH IN THE MORNING AND AT BEDTIME. 180 tablet 3  . dofetilide (TIKOSYN) 500 MCG capsule Take 1 capsule (500 mcg total) by mouth 2 (two) times daily. 60  capsule 12  . empagliflozin (JARDIANCE) 10 MG TABS tablet Take 1 tablet (10 mg total) by mouth daily before breakfast. 90 tablet 3  . loratadine (CLARITIN) 10 MG tablet Take 10 mg by mouth daily as needed for allergies.     . magnesium oxide (MAG-OX) 400 MG tablet Take 1 tablet (400 mg total) by mouth daily. 30 tablet 6  . sacubitril-valsartan (ENTRESTO) 24-26 MG Take 1 tablet by mouth 2 (two) times daily. 180 tablet 3   No current facility-administered medications for this visit.    No Known Allergies  Review of Systems negative except from HPI and PMH  Physical Exam BP 92/64   Pulse 67   Ht 5\' 7"  (1.702 m)   Wt 175 lb 12.8 oz (79.7 kg)   SpO2 97%   BMI 27.53 kg/m  Well developed and well nourished in no acute distress HENT normal Neck supple with JVP-flat Clear Device pocket well healed; without hematoma or erythema.  There is no tethering  Irregularly irregular rate and rhythm, no   Gallop  2/6 murmur mid LSB Abd-soft with active BS No Clubbing cyanosis   edema Skin-warm and dry A & Oriented  Grossly normal sensory and motor function  ECG Atrial fibrillation-coarse at 67 bpm   Assessment and plan  Hypertrophic cardio myopathy with severe LV dysfunction EF 25%  MR-severe  Ventricular tachycardia with appropriate therapy   LBBB//1AVB   Atrial Fibrillation-persistent long-term-recurrent  Inappropriate therapy  PVCs  Sleep disordered breathing and daytime somnolence  Status post ICD implantation-[employer]  Grief   No further ventricular tachycardia since the discontinuation of the ranolazine.  Atrial fibrillation is persistent.  25% of heart rates are faster than 100 bpm.  I reviewed with Dr. DM and will begin digoxin.  No evidence of outflow tract obstruction.  There is severe MR in the setting of left ventricular dysfunction and dilatation.  However, there is concern that there is a leaflet abnormality.  The TEE should be illuminating for this.  The  pathology of the mitral valve will inform treatment options.  As outlined above, atrial arrhythmia options include a maze done in conjunction with repair/replacement of his mitral valve and possibly even ringing of his tricuspid valve.  If an operative approach were pursued, would ask that a left ventricular lead be placed. I do not think a primary ablation has much utility.  If it were to control the atrial fibrillation, I do not think that it is likely that "atrial MR "would be impacted significantly given his HCM and his dilated atrium and that the mitral valve issue is likely codominant.  Hence, AV junction ablation and pacing would also be less likely to be fruitful  Catheterization and TEE are scheduled for next week.

## 2020-02-06 NOTE — H&P (View-Only) (Signed)
    Patient Care Team: Medicine, [facility] as PCP - General (Family Medicine) Brycin Kille C, MD as PCP - Electrophysiology (Cardiology)   HPI  Donald Bond is a 59 y.o. male Seen in followup for HCM associated with syncope and previously treated ventricular tachycardia;  He has had 3 previous devices.  Underwent gen change #4  2018 w AEGIS pouch support.   He has persistent afib; on apixoban . He underwent cardioversion at the time of his device change   He has had problems with Afib and RVR with reaching into (VF zone > 240 bpm)  Atenolol was increased   Unfortunately he has continued to have episodes detected by his device > 210/240  Dofetilide initiated 4/19; saw JA 6/19 for consideration of catheter ablation>> declined  Recurrent atrial fibrillation.  He got himself an AliveCor monitor and persistent atrial fibrillation documented 5/21 and he underwent cardioversion delayed following the death of his parents   An attempt to augment the benefits of dofetilide, we tried closing.  He ended up with proarrhythmia  He is also seen Dr. DM and he outlined 3 options that we had considered but he and I have not discussed and that is AF ablation/hybrid, AV ablation and CRT upgrade as well as surgical maze with mitral valve repair for his moderate-severe MR  Over the last few weeks he has felt much better,  Sob by the end of the day but not significantly dyspneic and no palpitations  He is scheduled for TEE/catheterization 12/15.      DATE TEST EF   4/18 Echo Normal  LAE (49/2.4/70)  9/21 Echo  25-30% MR severe TR mod-sev (RVE)           Date Cr  K Mg Hgb  9/18 0.95 4.3  15.5  4/19  0.89 4.3 2.1 16.6  9/19 0.99 4.2 2.2   2/20 0.91 4.5  45.3 (Hct)  10/20 0.93 4.5 2.2   7/21 1.19 5.4 2.3 16.2   11/21 0.9 5.1 2.3 15.3    Family >> Gene Screened Son Matthew + ( in school) William - ( struggling not in school)   Past Medical  History:  Diagnosis Date  . AICD (automatic cardioverter/defibrillator) present X 4  . Headache    "weekly" (06/15/2017)  . History of kidney stones   . Hypertr obst cardiomyop   . ICD--[employer] [facility]    Dr. Sun Wilensky  . Orthostatic lightheadedness   . Severe mitral valve regurgitation by Echo 11/24/19 11/26/2019  . SYNCOPE   . TIREDNESS   . Ventricular tachycardia (HCC)    rx via ICD    Past Surgical History:  Procedure Laterality Date  . CARDIAC CATHETERIZATION    . CARDIAC DEFIBRILLATOR PLACEMENT  ~ 2000; ~ 2010   "I've had 4" (06/15/2017)  . CARDIOVERSION  06/29/2016   Procedure: Cardioversion;  Surgeon: Zury Fazzino C Terri Rorrer, MD;  Location: [facility] INVASIVE CV LAB;  Service: Cardiovascular;;  . CARDIOVERSION N/A 06/17/2017   Procedure: CARDIOVERSION;  Surgeon: Skains, Gaetan C, MD;  Location: [facility] ENDOSCOPY;  Service: Cardiovascular;  Laterality: N/A;  . CARDIOVERSION N/A 07/11/2019   Procedure: CARDIOVERSION;  Surgeon: Hilty, Kenneth C, MD;  Location: [facility] ENDOSCOPY;  Service: Cardiovascular;  Laterality: N/A;  . CARDIOVERSION N/A 01/03/2020   Procedure: CARDIOVERSION;  Surgeon: Skains, Tavion C, MD;  Location: [facility] ENDOSCOPY;  Service: Cardiovascular;  Laterality: N/A;  . Guidant Vitality ICD Impantation--Guidant Vitality T135--11/13/2003    . HYDROCELE EXCISION / REPAIR   Left 04/2013  . ICD GENERATOR CHANGEOUT N/A 06/29/2016   Procedure: ICD Generator Changeout;  Surgeon: Duke Salvia, MD;  Location: [facility] INVASIVE CV LAB;  Service: Cardiovascular;  Laterality: N/A;  . LAPAROSCOPIC CHOLECYSTECTOMY  2003  . LITHOTRIPSY  X 1    Current Outpatient Medications  Medication Sig Dispense Refill  . apixaban (ELIQUIS) 5 MG TABS tablet Take 1 tablet (5 mg total) by mouth 2 (two) times daily. 180 tablet 6  . bisoprolol (ZEBETA) 5 MG tablet TAKE 1 TABLET (5 MG TOTAL) BY MOUTH IN THE MORNING AND AT BEDTIME. 180 tablet 3  . dofetilide (TIKOSYN) 500 MCG capsule Take 1 capsule (500 mcg total) by mouth 2 (two) times daily. 60  capsule 12  . empagliflozin (JARDIANCE) 10 MG TABS tablet Take 1 tablet (10 mg total) by mouth daily before breakfast. 90 tablet 3  . loratadine (CLARITIN) 10 MG tablet Take 10 mg by mouth daily as needed for allergies.     . magnesium oxide (MAG-OX) 400 MG tablet Take 1 tablet (400 mg total) by mouth daily. 30 tablet 6  . sacubitril-valsartan (ENTRESTO) 24-26 MG Take 1 tablet by mouth 2 (two) times daily. 180 tablet 3   No current facility-administered medications for this visit.    No Known Allergies  Review of Systems negative except from HPI and PMH  Physical Exam BP 92/64   Pulse 67   Ht 5\' 7"  (1.702 m)   Wt 175 lb 12.8 oz (79.7 kg)   SpO2 97%   BMI 27.53 kg/m  Well developed and well nourished in no acute distress HENT normal Neck supple with JVP-flat Clear Device pocket well healed; without hematoma or erythema.  There is no tethering  Irregularly irregular rate and rhythm, no   Gallop  2/6 murmur mid LSB Abd-soft with active BS No Clubbing cyanosis   edema Skin-warm and dry A & Oriented  Grossly normal sensory and motor function  ECG Atrial fibrillation-coarse at 67 bpm   Assessment and plan  Hypertrophic cardio myopathy with severe LV dysfunction EF 25%  MR-severe  Ventricular tachycardia with appropriate therapy   LBBB//1AVB   Atrial Fibrillation-persistent long-term-recurrent  Inappropriate therapy  PVCs  Sleep disordered breathing and daytime somnolence  Status post ICD implantation-[employer]  Grief   No further ventricular tachycardia since the discontinuation of the ranolazine.  Atrial fibrillation is persistent.  25% of heart rates are faster than 100 bpm.  I reviewed with Dr. DM and will begin digoxin.  No evidence of outflow tract obstruction.  There is severe MR in the setting of left ventricular dysfunction and dilatation.  However, there is concern that there is a leaflet abnormality.  The TEE should be illuminating for this.  The  pathology of the mitral valve will inform treatment options.  As outlined above, atrial arrhythmia options include a maze done in conjunction with repair/replacement of his mitral valve and possibly even ringing of his tricuspid valve.  If an operative approach were pursued, would ask that a left ventricular lead be placed. I do not think a primary ablation has much utility.  If it were to control the atrial fibrillation, I do not think that it is likely that "atrial MR "would be impacted significantly given his HCM and his dilated atrium and that the mitral valve issue is likely codominant.  Hence, AV junction ablation and pacing would also be less likely to be fruitful  Catheterization and TEE are scheduled for next week.

## 2020-02-06 NOTE — Patient Instructions (Addendum)
Medication Instructions:  Your physician has recommended you make the following change in your medication:   **Begin Digoxin 0.125mg  - 1 tablet by mouth daily  *If you need a refill on your cardiac medications before your next appointment, please call your pharmacy*   Lab Work: None ordered.  If you have labs (blood work) drawn today and your tests are completely normal, you will receive your results only by: Marland Kitchen MyChart Message (if you have MyChart) OR . A paper copy in the mail If you have any lab test that is abnormal or we need to change your treatment, we will call you to review the results.   Testing/Procedures: None ordered.    Follow-Up: At [facility], you and your health needs are our priority.  As part of our continuing mission to provide you with exceptional heart care, we have created designated Provider Care Teams.  These Care Teams include your primary Cardiologist (physician) and Advanced Practice Providers (APPs -  Physician Assistants and Nurse Practitioners) who all work together to provide you with the care you need, when you need it.  We recommend signing up for the patient portal called "MyChart".  Sign up information is provided on this After Visit Summary.  MyChart is used to connect with patients for Virtual Visits (Telemedicine).  Patients are able to view lab/test results, encounter notes, upcoming appointments, etc.  Non-urgent messages can be sent to your provider as well.   To learn more about what you can do with MyChart, go to [email or web address].    Your next appointment:   3 months with Dr Graciela Husbands.

## 2020-02-08 NOTE — Addendum Note (Signed)
Addended by: Melanee Spry on: 02/08/2020 10:35 AM   Modules accepted: Orders

## 2020-02-12 ENCOUNTER — Other Ambulatory Visit (HOSPITAL_COMMUNITY)
Admission: RE | Admit: 2020-02-12 | Discharge: 2020-02-12 | Disposition: A | Discharge status: Home / Self Care | Payer: BC Managed Care – PPO | Source: Ambulatory Visit | Attending: Cardiology | Admitting: Cardiology

## 2020-02-12 DIAGNOSIS — Z7901 Long term (current) use of anticoagulants: Secondary | ICD-10-CM | POA: Diagnosis not present

## 2020-02-12 DIAGNOSIS — I422 Other hypertrophic cardiomyopathy: Secondary | ICD-10-CM | POA: Diagnosis not present

## 2020-02-12 DIAGNOSIS — Z95810 Presence of automatic (implantable) cardiac defibrillator: Secondary | ICD-10-CM | POA: Diagnosis not present

## 2020-02-12 DIAGNOSIS — Z20822 Contact with and (suspected) exposure to covid-19: Secondary | ICD-10-CM | POA: Insufficient documentation

## 2020-02-12 DIAGNOSIS — Z01812 Encounter for preprocedural laboratory examination: Secondary | ICD-10-CM | POA: Insufficient documentation

## 2020-02-12 DIAGNOSIS — I447 Left bundle-branch block, unspecified: Secondary | ICD-10-CM | POA: Diagnosis not present

## 2020-02-12 DIAGNOSIS — Z79899 Other long term (current) drug therapy: Secondary | ICD-10-CM | POA: Diagnosis not present

## 2020-02-12 DIAGNOSIS — R400 Somnolence: Secondary | ICD-10-CM | POA: Diagnosis not present

## 2020-02-12 DIAGNOSIS — G4730 Sleep apnea, unspecified: Secondary | ICD-10-CM | POA: Diagnosis not present

## 2020-02-12 DIAGNOSIS — I4819 Other persistent atrial fibrillation: Secondary | ICD-10-CM | POA: Diagnosis not present

## 2020-02-12 DIAGNOSIS — I340 Nonrheumatic mitral (valve) insufficiency: Secondary | ICD-10-CM | POA: Diagnosis not present

## 2020-02-12 DIAGNOSIS — I5022 Chronic systolic (congestive) heart failure: Secondary | ICD-10-CM | POA: Diagnosis not present

## 2020-02-12 DIAGNOSIS — I083 Combined rheumatic disorders of mitral, aortic and tricuspid valves: Secondary | ICD-10-CM | POA: Diagnosis not present

## 2020-02-12 DIAGNOSIS — I272 Pulmonary hypertension, unspecified: Secondary | ICD-10-CM | POA: Diagnosis not present

## 2020-02-12 LAB — SARS CORONAVIRUS 2 (TAT 6-24 HRS): SARS Coronavirus 2: NEGATIVE

## 2020-02-13 NOTE — Progress Notes (Signed)
Remote ICD transmission.   

## 2020-02-14 ENCOUNTER — Ambulatory Visit (HOSPITAL_BASED_OUTPATIENT_CLINIC_OR_DEPARTMENT_OTHER)
Admission: RE | Admit: 2020-02-14 | Discharge: 2020-02-14 | Disposition: A | Discharge status: Home / Self Care | Payer: BC Managed Care – PPO | Source: Home / Self Care | Attending: Cardiology | Admitting: Cardiology

## 2020-02-14 ENCOUNTER — Other Ambulatory Visit (HOSPITAL_COMMUNITY): Payer: Self-pay | Admitting: Cardiology

## 2020-02-14 ENCOUNTER — Other Ambulatory Visit: Payer: Self-pay

## 2020-02-14 ENCOUNTER — Ambulatory Visit (HOSPITAL_COMMUNITY)
Admission: RE | Admit: 2020-02-14 | Discharge: 2020-02-14 | Disposition: A | Discharge status: Home / Self Care | Payer: BC Managed Care – PPO | Attending: Cardiology | Admitting: Cardiology

## 2020-02-14 ENCOUNTER — Encounter (HOSPITAL_COMMUNITY): Payer: Self-pay | Admitting: Cardiology

## 2020-02-14 ENCOUNTER — Encounter (HOSPITAL_COMMUNITY)
Admission: RE | Disposition: A | Discharge status: Home / Self Care | Payer: Self-pay | Source: Home / Self Care | Attending: Cardiology

## 2020-02-14 DIAGNOSIS — Z95810 Presence of automatic (implantable) cardiac defibrillator: Secondary | ICD-10-CM | POA: Diagnosis not present

## 2020-02-14 DIAGNOSIS — I4819 Other persistent atrial fibrillation: Secondary | ICD-10-CM | POA: Insufficient documentation

## 2020-02-14 DIAGNOSIS — I422 Other hypertrophic cardiomyopathy: Secondary | ICD-10-CM | POA: Insufficient documentation

## 2020-02-14 DIAGNOSIS — I340 Nonrheumatic mitral (valve) insufficiency: Secondary | ICD-10-CM

## 2020-02-14 DIAGNOSIS — I083 Combined rheumatic disorders of mitral, aortic and tricuspid valves: Secondary | ICD-10-CM | POA: Diagnosis not present

## 2020-02-14 DIAGNOSIS — I5022 Chronic systolic (congestive) heart failure: Secondary | ICD-10-CM | POA: Insufficient documentation

## 2020-02-14 DIAGNOSIS — Z79899 Other long term (current) drug therapy: Secondary | ICD-10-CM | POA: Insufficient documentation

## 2020-02-14 DIAGNOSIS — G4730 Sleep apnea, unspecified: Secondary | ICD-10-CM | POA: Diagnosis not present

## 2020-02-14 DIAGNOSIS — I447 Left bundle-branch block, unspecified: Secondary | ICD-10-CM | POA: Diagnosis not present

## 2020-02-14 DIAGNOSIS — Z20822 Contact with and (suspected) exposure to covid-19: Secondary | ICD-10-CM | POA: Diagnosis not present

## 2020-02-14 DIAGNOSIS — I272 Pulmonary hypertension, unspecified: Secondary | ICD-10-CM | POA: Diagnosis not present

## 2020-02-14 DIAGNOSIS — I361 Nonrheumatic tricuspid (valve) insufficiency: Secondary | ICD-10-CM

## 2020-02-14 DIAGNOSIS — Z7901 Long term (current) use of anticoagulants: Secondary | ICD-10-CM | POA: Diagnosis not present

## 2020-02-14 DIAGNOSIS — R400 Somnolence: Secondary | ICD-10-CM | POA: Diagnosis not present

## 2020-02-14 DIAGNOSIS — I2510 Atherosclerotic heart disease of native coronary artery without angina pectoris: Secondary | ICD-10-CM | POA: Diagnosis not present

## 2020-02-14 HISTORY — PX: RIGHT/LEFT HEART CATH AND CORONARY ANGIOGRAPHY: CATH118266

## 2020-02-14 HISTORY — PX: TEE WITHOUT CARDIOVERSION: SHX5443

## 2020-02-14 LAB — POCT I-STAT EG7
Acid-base deficit: 1 mmol/L (ref 0.0–2.0)
Acid-base deficit: 2 mmol/L (ref 0.0–2.0)
Bicarbonate: 24.3 mmol/L (ref 20.0–28.0)
Bicarbonate: 25.5 mmol/L (ref 20.0–28.0)
Calcium, Ion: 1.16 mmol/L (ref 1.15–1.40)
Calcium, Ion: 1.27 mmol/L (ref 1.15–1.40)
HCT: 44 % (ref 39.0–52.0)
HCT: 46 % (ref 39.0–52.0)
Hemoglobin: 15 g/dL (ref 13.0–17.0)
Hemoglobin: 15.6 g/dL (ref 13.0–17.0)
O2 Saturation: 76 %
O2 Saturation: 76 %
Potassium: 3.7 mmol/L (ref 3.5–5.1)
Potassium: 3.9 mmol/L (ref 3.5–5.1)
Sodium: 141 mmol/L (ref 135–145)
Sodium: 142 mmol/L (ref 135–145)
TCO2: 26 mmol/L (ref 22–32)
TCO2: 27 mmol/L (ref 22–32)
pCO2, Ven: 47.5 mmHg (ref 44.0–60.0)
pCO2, Ven: 49.9 mmHg (ref 44.0–60.0)
pH, Ven: 7.316 (ref 7.250–7.430)
pH, Ven: 7.317 (ref 7.250–7.430)
pO2, Ven: 44 mmHg (ref 32.0–45.0)
pO2, Ven: 45 mmHg (ref 32.0–45.0)

## 2020-02-14 LAB — CUP PACEART INCLINIC DEVICE CHECK
Brady Statistic RV Percent Paced: 1 % — CL
Date Time Interrogation Session: 20211207000000
HighPow Impedance: 43 Ohm
HighPow Impedance: 44 Ohm
Implantable Lead Implant Date: 20000815
Implantable Lead Location: 753860
Implantable Lead Model: 144
Implantable Lead Serial Number: 334759
Implantable Pulse Generator Implant Date: 20180430
Lead Channel Impedance Value: 796 Ohm
Lead Channel Pacing Threshold Amplitude: 0.9 V
Lead Channel Pacing Threshold Pulse Width: 0.4 ms
Lead Channel Sensing Intrinsic Amplitude: 25 mV
Lead Channel Setting Pacing Amplitude: 2.5 V
Lead Channel Setting Pacing Pulse Width: 0.4 ms
Lead Channel Setting Sensing Sensitivity: 0.5 mV
Pulse Gen Serial Number: 198978

## 2020-02-14 LAB — GLUCOSE, CAPILLARY
Glucose-Capillary: 57 mg/dL — ABNORMAL LOW (ref 70–99)
Glucose-Capillary: 72 mg/dL (ref 70–99)

## 2020-02-14 SURGERY — ECHOCARDIOGRAM, TRANSESOPHAGEAL
Anesthesia: Moderate Sedation

## 2020-02-14 SURGERY — RIGHT/LEFT HEART CATH AND CORONARY ANGIOGRAPHY
Anesthesia: LOCAL

## 2020-02-14 MED ORDER — HEPARIN SODIUM (PORCINE) 1000 UNIT/ML IJ SOLN
INTRAMUSCULAR | Status: AC
  Filled 2020-02-14: qty 1

## 2020-02-14 MED ORDER — MIDAZOLAM HCL 2 MG/2ML IJ SOLN
INTRAMUSCULAR | Status: AC
  Filled 2020-02-14: qty 2

## 2020-02-14 MED ORDER — ACETAMINOPHEN 325 MG PO TABS: 650.0000 mg | ORAL_TABLET | ORAL | Status: DC | PRN

## 2020-02-14 MED ORDER — MIDAZOLAM HCL (PF) 10 MG/2ML IJ SOLN
INTRAMUSCULAR | Status: DC | PRN
  Administered 2020-02-14 (×2): 1 mg via INTRAVENOUS
  Administered 2020-02-14: 2 mg via INTRAVENOUS

## 2020-02-14 MED ORDER — ONDANSETRON HCL 4 MG/2ML IJ SOLN: 4.0000 mg | Freq: Four times a day (QID) | INTRAMUSCULAR | Status: DC | PRN

## 2020-02-14 MED ORDER — MIDAZOLAM HCL (PF) 5 MG/ML IJ SOLN
INTRAMUSCULAR | Status: AC
  Filled 2020-02-14: qty 2

## 2020-02-14 MED ORDER — IOHEXOL 350 MG/ML SOLN
INTRAVENOUS | Status: DC | PRN
  Administered 2020-02-14: 45 mL

## 2020-02-14 MED ORDER — HEPARIN (PORCINE) IN NACL 1000-0.9 UT/500ML-% IV SOLN
INTRAVENOUS | Status: DC | PRN
  Administered 2020-02-14: 500 mL

## 2020-02-14 MED ORDER — SODIUM CHLORIDE 0.9 % IV SOLN: 250.0000 mL | INTRAVENOUS | Status: DC | PRN

## 2020-02-14 MED ORDER — HYDRALAZINE HCL 20 MG/ML IJ SOLN: 10.0000 mg | INTRAMUSCULAR | Status: DC | PRN

## 2020-02-14 MED ORDER — BUTAMBEN-TETRACAINE-BENZOCAINE 2-2-14 % EX AERO
INHALATION_SPRAY | CUTANEOUS | Status: DC | PRN
  Administered 2020-02-14: 2 via TOPICAL

## 2020-02-14 MED ORDER — FENTANYL CITRATE (PF) 100 MCG/2ML IJ SOLN
INTRAMUSCULAR | Status: AC
  Filled 2020-02-14: qty 4

## 2020-02-14 MED ORDER — SODIUM CHLORIDE 0.9% FLUSH: 3.0000 mL | Freq: Two times a day (BID) | INTRAVENOUS | Status: DC

## 2020-02-14 MED ORDER — FENTANYL CITRATE (PF) 100 MCG/2ML IJ SOLN
INTRAMUSCULAR | Status: AC
  Filled 2020-02-14: qty 2

## 2020-02-14 MED ORDER — LABETALOL HCL 5 MG/ML IV SOLN: 10.0000 mg | INTRAVENOUS | Status: DC | PRN

## 2020-02-14 MED ORDER — SODIUM CHLORIDE 0.9% FLUSH: 3.0000 mL | INTRAVENOUS | Status: DC | PRN

## 2020-02-14 MED ORDER — HEPARIN (PORCINE) IN NACL 1000-0.9 UT/500ML-% IV SOLN
INTRAVENOUS | Status: AC
  Filled 2020-02-14: qty 1000

## 2020-02-14 MED ORDER — LIDOCAINE HCL (PF) 1 % IJ SOLN
INTRAMUSCULAR | Status: AC
  Filled 2020-02-14: qty 30

## 2020-02-14 MED ORDER — DIPHENHYDRAMINE HCL 50 MG/ML IJ SOLN
INTRAMUSCULAR | Status: AC
  Filled 2020-02-14: qty 1

## 2020-02-14 MED ORDER — ASPIRIN 81 MG PO CHEW: 81.0000 mg | CHEWABLE_TABLET | ORAL | Status: DC

## 2020-02-14 MED ORDER — VERAPAMIL HCL 2.5 MG/ML IV SOLN
INTRAVENOUS | Status: AC
  Filled 2020-02-14: qty 2

## 2020-02-14 MED ORDER — APIXABAN 5 MG PO TABS: 5.0000 mg | ORAL_TABLET | Freq: Two times a day (BID) | ORAL | 6 refills | Status: DC

## 2020-02-14 MED ORDER — SODIUM CHLORIDE 0.9 % WEIGHT BASED INFUSION: 1.0000 mL/kg/h | INTRAVENOUS | Status: DC

## 2020-02-14 MED ORDER — FENTANYL CITRATE (PF) 100 MCG/2ML IJ SOLN
INTRAMUSCULAR | Status: DC | PRN
  Administered 2020-02-14: 25 ug via INTRAVENOUS

## 2020-02-14 MED ORDER — MIDAZOLAM HCL 2 MG/2ML IJ SOLN
INTRAMUSCULAR | Status: DC | PRN
  Administered 2020-02-14 (×2): 1 mg via INTRAVENOUS

## 2020-02-14 MED ORDER — FENTANYL CITRATE (PF) 100 MCG/2ML IJ SOLN
INTRAMUSCULAR | Status: DC | PRN
  Administered 2020-02-14 (×2): 25 ug via INTRAVENOUS

## 2020-02-14 MED ORDER — LIDOCAINE HCL (PF) 1 % IJ SOLN
INTRAMUSCULAR | Status: DC | PRN
  Administered 2020-02-14 (×2): 2 mL
  Administered 2020-02-14: 1 mL
  Administered 2020-02-14: 15 mL

## 2020-02-14 MED ORDER — SODIUM CHLORIDE 0.9 % IV SOLN: INTRAVENOUS | Status: DC

## 2020-02-14 SURGICAL SUPPLY — 15 items
CATH 5FR JL3.5 JR4 ANG PIG MP (CATHETERS) ×1 IMPLANT
CATH BALLN WEDGE 5F 110CM (CATHETERS) ×2 IMPLANT
CATH INFINITI 5FR JL4 (CATHETERS) ×1 IMPLANT
CLOSURE MYNX CONTROL 5F (Vascular Products) ×1 IMPLANT
GLIDESHEATH SLEND SS 6F .021 (SHEATH) ×1 IMPLANT
GUIDEWIRE INQWIRE 1.5J.035X260 (WIRE) ×1 IMPLANT
INQWIRE 1.5J .035X260CM (WIRE) ×2
KIT HEART LEFT (KITS) ×2 IMPLANT
PACK CARDIAC CATHETERIZATION (CUSTOM PROCEDURE TRAY) ×2 IMPLANT
SHEATH GLIDE SLENDER 4/5FR (SHEATH) ×1 IMPLANT
SHEATH PINNACLE 5F 10CM (SHEATH) ×1 IMPLANT
SHEATH PROBE COVER 6X72 (BAG) ×2 IMPLANT
TRANSDUCER W/STOPCOCK (MISCELLANEOUS) ×2 IMPLANT
WIRE EMERALD 3MM-J .025X260CM (WIRE) ×1 IMPLANT
WIRE EMERALD 3MM-J .035X150CM (WIRE) ×1 IMPLANT

## 2020-02-14 NOTE — Progress Notes (Signed)
Ambulated to the bathroom to void tol well. No bleeding noted before or after ambulation.  

## 2020-02-14 NOTE — Interval H&P Note (Signed)
History and Physical Interval Note:  02/14/2020 11:26 AM  Donald Bond  has presented today for surgery, with the diagnosis of cardiomyopathy.  The various methods of treatment have been discussed with the patient and family. After consideration of risks, benefits and other options for treatment, the patient has consented to  Procedure(s): RIGHT/LEFT HEART CATH AND CORONARY ANGIOGRAPHY (N/A) as a surgical intervention.  The patient's history has been reviewed, patient examined, no change in status, stable for surgery.  I have reviewed the patient's chart and labs.  Questions were answered to the patient's satisfaction.     Chasey Dull [employer]

## 2020-02-14 NOTE — Discharge Instructions (Signed)
Femoral Site Care This sheet gives you information about how to care for yourself after your procedure. Your health care provider may also give you more specific instructions. If you have problems or questions, contact your health care provider. What can I expect after the procedure? After the procedure, it is common to have:  Bruising that usually fades within 1-2 weeks.  Tenderness at the site. Follow these instructions at home: Wound care  Follow instructions from your health care provider about how to take care of your insertion site. Make sure you: ? Wash your hands with soap and water before you change your bandage (dressing). If soap and water are not available, use hand sanitizer. ? Change your dressing as told by your health care provider. ? Leave stitches (sutures), skin glue, or adhesive strips in place. These skin closures may need to stay in place for 2 weeks or longer. If adhesive strip edges start to loosen and curl up, you may trim the loose edges. Do not remove adhesive strips completely unless your health care provider tells you to do that.  Do not take baths, swim, or use a hot tub until your health care provider approves.  You may shower 24-48 hours after the procedure or as told by your health care provider. ? Gently wash the site with plain soap and water. ? Pat the area dry with a clean towel. ? Do not rub the site. This may cause bleeding.  Do not apply powder or lotion to the site. Keep the site clean and dry.  Check your femoral site every day for signs of infection. Check for: ? Redness, swelling, or pain. ? Fluid or blood. ? Warmth. ? Pus or a bad smell. Activity  For the first 2-3 days after your procedure, or as long as directed: ? Avoid climbing stairs as much as possible. ? Do not squat.  Do not lift anything that is heavier than 10 lb (4.5 kg), or the limit that you are told, until your health care provider says that it is safe.  Rest as  directed. ? Avoid sitting for a long time without moving. Get up to take short walks every 1-2 hours.  Do not drive for 24 hours if you were given a medicine to help you relax (sedative). General instructions  Take over-the-counter and prescription medicines only as told by your health care provider.  Keep all follow-up visits as told by your health care provider. This is important. Contact a health care provider if you have:  A fever or chills.  You have redness, swelling, or pain around your insertion site. Get help right away if:  The catheter insertion area swells very fast.  You pass out.  You suddenly start to sweat or your skin gets clammy.  The catheter insertion area is bleeding, and the bleeding does not stop when you hold steady pressure on the area.  The area near or just beyond the catheter insertion site becomes pale, cool, tingly, or numb. These symptoms may represent a serious problem that is an emergency. Do not wait to see if the symptoms will go away. Get medical help right away. Call your local emergency services (911 in the [place] Do not drive yourself to the hospital. Summary  After the procedure, it is common to have bruising that usually fades within 1-2 weeks.  Check your femoral site every day for signs of infection.  Do not lift anything that is heavier than 10 lb (4.5 kg), or the   limit that you are told, until your health care provider says that it is safe. This information is not intended to replace advice given to you by your health care provider. Make sure you discuss any questions you have with your health care provider. Document Revised: 03/01/2017 Document Reviewed: 03/01/2017 Elsevier Patient Education  2020 Elsevier Inc.  

## 2020-02-14 NOTE — CV Procedure (Signed)
Procedure: TEE  Sedation: Versed 4 mg IV, Fentanyl 25 mcg IV  Indication: Mitral regurgitation.   Findings: Please see echo section for full report.  Normal LV size. There is normal LV size with severe basal septal LV hypertrophy.  EF 30-35% with diffuse hypokinesis.  Normal RV size with mildly decreased systolic function.  Severe left atrial enlargement, no LA appendage thrombus.  Moderate right atrial enlargement.  No ASD or PFO by color doppler.  Moderate TR, peak RV-RA gradient 30 mmHg.  Trileaflet aortic valve, mild aortic insufficiency and no AS.  The posterior mitral leaflet is thickened and restricted.  There are 2 jets of mitral regurgitation, combined PISA ERO is 0.3 cm^2.  The pulmonary vein systolic doppler signal is flattened but not reversed.  3+ mitral regurgitation, suspect Carpentier IIIb. Normal caliber thoracic aorta with minimal plaque.   Impression: 3+ MR, Carpentier IIIb.   Donald Bond 02/14/2020 11:22 AM

## 2020-02-14 NOTE — Interval H&P Note (Signed)
History and Physical Interval Note:  02/14/2020 10:32 AM  Donald Bond  has presented today for surgery, with the diagnosis of MITRAL REGURGITATION.  The various methods of treatment have been discussed with the patient and family. After consideration of risks, benefits and other options for treatment, the patient has consented to  Procedure(s): TRANSESOPHAGEAL ECHOCARDIOGRAM (TEE) (N/A) as a surgical intervention.  The patient's history has been reviewed, patient examined, no change in status, stable for surgery.  I have reviewed the patient's chart and labs.  Questions were answered to the patient's satisfaction.     Semir Brill [employer]

## 2020-02-14 NOTE — Progress Notes (Signed)
Discharge instructions reviewed with pt and his wife (via telephone) both voice understanding.  

## 2020-02-15 ENCOUNTER — Encounter (HOSPITAL_COMMUNITY): Payer: Self-pay | Admitting: Cardiology

## 2020-02-15 MED FILL — Heparin Sod (Porcine)-NaCl IV Soln 1000 Unit/500ML-0.9%: INTRAVENOUS | Qty: 500 | Status: AC

## 2020-02-15 MED FILL — Verapamil HCl IV Soln 2.5 MG/ML: INTRAVENOUS | Qty: 2 | Status: AC

## 2020-02-28 ENCOUNTER — Encounter (HOSPITAL_COMMUNITY): Payer: Self-pay | Admitting: Cardiology

## 2020-02-28 ENCOUNTER — Ambulatory Visit (HOSPITAL_COMMUNITY)
Admission: RE | Admit: 2020-02-28 | Discharge: 2020-02-28 | Disposition: A | Discharge status: Home / Self Care | Payer: BC Managed Care – PPO | Source: Ambulatory Visit | Attending: Cardiology | Admitting: Cardiology

## 2020-02-28 ENCOUNTER — Other Ambulatory Visit: Payer: Self-pay

## 2020-02-28 VITALS — BP 112/78 | HR 63 | Wt 174.0 lb

## 2020-02-28 DIAGNOSIS — Z7901 Long term (current) use of anticoagulants: Secondary | ICD-10-CM | POA: Diagnosis not present

## 2020-02-28 DIAGNOSIS — I472 Ventricular tachycardia: Secondary | ICD-10-CM | POA: Insufficient documentation

## 2020-02-28 DIAGNOSIS — Z79899 Other long term (current) drug therapy: Secondary | ICD-10-CM | POA: Diagnosis not present

## 2020-02-28 DIAGNOSIS — I2510 Atherosclerotic heart disease of native coronary artery without angina pectoris: Secondary | ICD-10-CM | POA: Insufficient documentation

## 2020-02-28 DIAGNOSIS — I5022 Chronic systolic (congestive) heart failure: Secondary | ICD-10-CM | POA: Diagnosis not present

## 2020-02-28 DIAGNOSIS — I340 Nonrheumatic mitral (valve) insufficiency: Secondary | ICD-10-CM | POA: Diagnosis not present

## 2020-02-28 DIAGNOSIS — I480 Paroxysmal atrial fibrillation: Secondary | ICD-10-CM

## 2020-02-28 DIAGNOSIS — I422 Other hypertrophic cardiomyopathy: Secondary | ICD-10-CM

## 2020-02-28 HISTORY — DX: Heart failure, unspecified: I50.9

## 2020-02-28 LAB — LIPID PANEL
Cholesterol: 183 mg/dL (ref 0–200)
HDL: 68 mg/dL (ref >40)
LDL Cholesterol: 101 mg/dL — ABNORMAL HIGH (ref 0–99)
Total CHOL/HDL Ratio: 2.7 RATIO
Triglycerides: 69 mg/dL (ref <150)
VLDL: 14 mg/dL (ref 0–40)

## 2020-02-28 LAB — BASIC METABOLIC PANEL
Anion gap: 8 (ref 5–15)
BUN: 14 mg/dL (ref 6–20)
CO2: 25 mmol/L (ref 22–32)
Calcium: 9.1 mg/dL (ref 8.9–10.3)
Chloride: 107 mmol/L (ref 98–111)
Creatinine, Ser: 0.95 mg/dL (ref 0.61–1.24)
GFR, Estimated: >60 mL/min (ref >60)
Glucose, Bld: 76 mg/dL (ref 70–99)
Potassium: 4.5 mmol/L (ref 3.5–5.1)
Sodium: 140 mmol/L (ref 135–145)

## 2020-02-28 LAB — DIGOXIN LEVEL: Digoxin Level: 0.6 ng/mL — ABNORMAL LOW (ref 0.8–2.0)

## 2020-02-28 MED ORDER — ENTRESTO 49-51 MG PO TABS: 1.0000 | ORAL_TABLET | Freq: Two times a day (BID) | ORAL | 11 refills | Status: DC

## 2020-02-28 NOTE — Patient Instructions (Addendum)
Labs done today. We will contact you only if your labs are abnormal.  INCREASE Entresto 49-51mg  (1 tablet) by mouth 2 times daily.  No other medication changes were made. Please continue all other medications as prescribed.   Your physician recommends that you schedule a follow-up appointment in: 10 days for a lab only appointment, 3 weeks for an appointment with Pharmacy Clinic and 6 weeks with Dr. Earlean Shawl have been referred to Dr. Barry Dienes for mitral valve repair. His office will contact you to schedule an appointment.  If you have any questions or concerns before your next appointment please send [place] a message through [place] or call our office at [phone].    TO LEAVE A MESSAGE FOR THE NURSE SELECT OPTION 2, PLEASE LEAVE A MESSAGE INCLUDING: . YOUR NAME . DATE OF BIRTH . CALL BACK NUMBER . REASON FOR CALL**this is important as we prioritize the call backs  YOU WILL RECEIVE A CALL BACK THE SAME DAY AS LONG AS YOU CALL BEFORE 4:00 PM   Do the following things EVERYDAY: 1) Weigh yourself in the morning before breakfast. Write it down and keep it in a log. 2) Take your medicines as prescribed 3) Eat low salt foods--Limit salt (sodium) to 2000 mg per day.  4) Stay as active as you can everyday 5) Limit all fluids for the day to less than 2 liters  At [facility], you and your health needs are our priority. As part of our continuing mission to provide you with exceptional heart care, we have created designated Provider Care Teams. These Care Teams include your primary Cardiologist (physician) and Advanced Practice Providers (APPs- Physician Assistants and Nurse Practitioners) who all work together to provide you with the care you need, when you need it.   You may see any of the following providers on your designated Care Team at your next follow up: Marland Kitchen Dr Arvilla Meres . Dr Marca Ancona . Tonye Becket, NP . Robbie Lis, PA . Karle Plumber,  PharmD   Please be sure to bring in all your medications bottles to every appointment.

## 2020-02-28 NOTE — Progress Notes (Signed)
PCP: Medicine, [facility] EP: Dr. Graciela Husbands HF Cardiology: Dr. Shirlee Latch  59 y.o. with hypertrophic nonobstructive cardiomyopathy (gene+) was referred by Dr. Graciela Husbands for evaluation of newly noted systolic dysfunction.  Patient has HCM as evidenced by severe asymmetric septal hypertrophy.  He does not have LVOT obstruction or SAM.  His brother had SCD at 34, presumable due to HCM, and patient has a Environmental manager ICD.  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  Patient has struggled with atrial fibrillation.  He was seen by Dr. Johney Frame a couple of years ago and decided against AF ablation.  He is currently maintained on Tikosyn.  He was on ranolazone + Tikosyn but developed VT x 2 on this combination and ranolazine was stopped.  Most recently, had DCCV 01/03/20.  He is now back in atrial fibrillation.  He feels the atrial fibrillation when his rate gets fast (>100 bpm).  Today, rate is controlled in the 70s.  Echo in 4/18 showed EF 55-60%, but echo in 9/21 showed EF down to 25-30% with moderate-severe MR (no SAM).   Patient had RHC/LHC in 12/21 showing 90% stenosis in small ostial D1 and small ostial OM2, no interventional target.  Cardiac output was preserved with normal filling pressures.  TEE showed EF 30-35% with 3+ mitral regurgitation.   Patient returns for followup of CHF.  He is doing pretty well symptomatically. He walks a lot a work, no dyspnea walking on flat ground.  No lightheadedness.  No chest pain.  No orthopnea/PND.  Weight is stable.   ECG (personally reviewed): atrial fibrillation, LBBB (144 msec), QTc 449 msec  Labs (10/21): K 4.3, creatinine 0.75 Labs (11/21): K 5.1, creatinine 0.9  PMH: 1. Atrial fibrillation: Paroxysmal.  Was on ranolazine + Tikosyn but had VT, ranolazine stopped.  Now on Tikosyn.  - Most recent DCCV 11/21. 2. Hypertrophic cardiomyopathy: No LVOT obstruction  or SAM.  Gene+.  Brother with SCD at 26.  - [employer] ICD - Echo (4/18): Severe asymmetric septal hypertrophy, no SAM, moderate MR, EF 55-60%.  - Echo (9/21): Severe asymmetric septal hypertrophy, no SAM or LVOT obstruction, EF 25-30%, mildly decreased RV systolic function, PASP 60 mmHg, severe LAE, severe MR with restricted posterior leaflet.  - TEE (12/21): severe asymmetric basal septal hypertrophy, EF 30-35%, mildly decreased RV systolic function, severe LAE, moderate TR, mild AI, restricted posterior MV leaflet with 2 MR jets, ERO 0.3 (suspect 3+ MR, Carpentier IIIb).  3. Chronic systolic CHF: Echo (9/21): Severe asymmetric septal hypertrophy, no SAM or LVOT obstruction, EF 25-30%, mildly decreased RV systolic function, PASP 60 mmHg, severe LAE, severe MR with restricted posterior leaflet.  - LHC/RHC (12/21): 90% stenosis in small ostial D1 and small ostial OM2, no interventional target. Mean RA 3, PA 38/14, mean PCWP 14 with v waves to 22, CI 2.66, PVR 2.1 WU.  4. VT: 9/21, suspected to be due to ranolazine + Tikosyn combination, ranolazine stopped.  5. H/o nephrolithiasis.  6. Mitral regurgitation: Moderate to severe by my review of 9/21 echo,  restriction of posterior leaflet.  - TEE (12/21): Restricted posterior MV leaflet with 2 MR jets, ERO 0.3 (suspect 3+ MR, Carpentier IIIb). 7. LBBB  FH: Mother with atrial fibrillation.  Brother with SCD at 58.  Son also with HCM gene+.   Social History   Socioeconomic History  . Marital status: Married    Spouse name: Not on  file  . Number of children: Not on file  . Years of education: Not on file  . Highest education level: Not on file  Occupational History  . Not on file  Tobacco Use  . Smoking status: Never Smoker  . Smokeless tobacco: Former Neurosurgeon    Types: Engineer, drilling  . Vaping Use: Never used  Substance and Sexual Activity  . Alcohol use: Yes    Alcohol/week: 3.0 standard drinks    Types: 3 Cans of beer per week  .  Drug use: No  . Sexual activity: Yes  Other Topics Concern  . Not on file  Social History Narrative   Lives with spouse in [place]   employed   Social Determinants of Health   Financial Resource Strain: Not on file  Food Insecurity: Not on file  Transportation Needs: Not on file  Physical Activity: Not on file  Stress: Not on file  Social Connections: Not on file  Intimate Partner Violence: Not on file   ROS: All systems reviewed and negative except as per HPI.   Current Outpatient Medications  Medication Sig Dispense Refill  . acetaminophen (TYLENOL) 325 MG tablet Take 650 mg by mouth every 6 (six) hours as needed (for pain.).    Marland Kitchen bisoprolol (ZEBETA) 5 MG tablet TAKE 1 TABLET (5 MG TOTAL) BY MOUTH IN THE MORNING AND AT BEDTIME. 180 tablet 3  . digoxin (LANOXIN) 0.125 MG tablet Take 1 tablet (0.125 mg total) by mouth daily. 90 tablet 3  . dofetilide (TIKOSYN) 500 MCG capsule Take 1 capsule (500 mcg total) by mouth 2 (two) times daily. 60 capsule 12  . ELIQUIS 5 MG TABS tablet TAKE 1 TABLET BY MOUTH TWICE A DAY 180 tablet 6  . empagliflozin (JARDIANCE) 10 MG TABS tablet Take 1 tablet (10 mg total) by mouth daily before breakfast. 90 tablet 3  . loratadine (CLARITIN) 10 MG tablet Take 10 mg by mouth daily as needed for allergies.     . magnesium oxide (MAG-OX) 400 MG tablet Take 1 tablet (400 mg total) by mouth daily. 30 tablet 6  . sacubitril-valsartan (ENTRESTO) 49-51 MG Take 1 tablet by mouth 2 (two) times daily. 60 tablet 11   No current facility-administered medications for this encounter.   BP 112/78   Pulse 63   Wt 78.9 kg (174 lb)   SpO2 97%   BMI 27.25 kg/m  General: NAD Neck: No JVD, no thyromegaly or thyroid nodule.  Lungs: Clear to auscultation bilaterally with normal respiratory effort. CV: Nondisplaced PMI.  Heart regular S1/S2, no S3/S4, 2/6 HSM apex.  No peripheral edema.  No carotid bruit.  Normal pedal pulses.  Abdomen: Soft, nontender, no  hepatosplenomegaly, no distention.  Skin: Intact without lesions or rashes.  Neurologic: Alert and oriented x 3.  Psych: Normal affect. Extremities: No clubbing or cyanosis.  HEENT: Normal.   Assessment/Plan: 1. Chronic systolic CHF: Echo in 9/21 showed severe asymmetric septal hypertrophy, no SAM or LVOT obstruction, EF 25-30%, mildly decreased RV systolic function, PASP 60 mmHg, severe LAE, severe MR with restricted posterior leaflet.  EF was lower than in the past (normal in 4/18) and mitral regurgitation was worse.  LHC/RHC in 12/21 showed 90% stenosis small D1 and 90% stenosis small OM2 (CAD does not explain cardiomyopathy) and normal filling pressures/preserved cardiac output.  [employer] ICD.  HR seems to be generally controlled in AF though he does have occasional RVR which may contribute to fall in EF. He has  a chronic LBBB which may contribute (though not markedly wide at 144 msec today). It could be natural history of HCM with worsening of function over time.  On exam, he is not volume overloaded.  NYHA class II symptoms.  - Continue bisoprolol 5 mg bid.  - Increase Entresto to 49/51 bid. BMET today and in 10 days.  - Continue dapagliflozin 10 mg daily.   - Continue digoxin, check level today.  - Would like to start spironolactone eventually but K has been mildly elevated.  - I do not think he will need Lasix right now.  - He may benefit from device upgrade to CRT given LBBB.  Would consider epicardial lead placement in setting of Maze/MV repair (see below).  2. Atrial fibrillation: Paroxysmal => persistent.  He had DCCV 01/03/20 but is back in atrial fibrillation now despite Tikosyn use.  He is off ranolazine due to VT with combination of Tikosyn and ranolazine.  Rate is fast at times though in the 70s in the office today.  Tachy-mediated CMP could contribute to low EF, but as noted his HR seems to be < 100 bpm most of the time. Atrial fibrillation ablation likely has a lower chance  of success here due to the size of the left atrium (severely enlarged).  He feels worse when he is in afib and HR is > 100.  - Continue bisoprolol 5 mg bid for rate control.  - Continue apixaban 5 mg bid.  - For now, will continue Tikosyn. QTc ok on ECG today.  - I would favor surgical evaluation for Maze, MV repair, and epicardial LV lead placement.  3. Mitral regurgitation: TEE in 12/21 with 3+ MR, Carpentier IIIb.   - As above, would like him evaluated for minimally invasive MV repair + maze + epicardial LV lead.  I will refer to Dr. Cornelius Moras at [facility] for evaluation.   4. Hypertrophic cardiomyopathy: No SAM or LVOT obstruction, has asymmetric septal hypertrophy.  Gene+, brother with SCD at 59.   - When he returns, he is going to bring [place] his copy of the genetic testing results just to make a record of it.  5. VT: In 9/21, suspect due to combination of ranolazone and Tikosyn with QT prolongation.  Now off ranolazine.  6. CAD: LHC (12/21) with 90% ostial small D1 and 90% ostial small OM2.  CAD does not explain the extent of his cardiomyopathy.   - Check lipids, will need statin for goal LDL < 70.  Will determine dose based on lipids.  - No ASA given Eliquis use.   Followup HF pharmacist in 3 wks for med titration.  See me 6 wks.   Marca Ancona 02/28/2020

## 2020-02-29 ENCOUNTER — Telehealth (HOSPITAL_COMMUNITY): Payer: Self-pay

## 2020-02-29 DIAGNOSIS — E785 Hyperlipidemia, unspecified: Secondary | ICD-10-CM

## 2020-02-29 MED ORDER — ROSUVASTATIN CALCIUM 10 MG PO TABS: 10.0000 mg | ORAL_TABLET | Freq: Every day | ORAL | 11 refills | Status: DC

## 2020-02-29 NOTE — Telephone Encounter (Signed)
Patient advised and verbalized understanding. New Rx sent into patients pharmacy. Patient has pending appt in feb.will recheck labs at that time.

## 2020-02-29 NOTE — Telephone Encounter (Signed)
-----   Message from Laurey Morale, MD sent at 02/28/2020 10:57 PM EST ----- Start Crestor 10 mg daily with lipids/LFTs in 2 months.

## 2020-03-04 NOTE — Progress Notes (Signed)
PCP: Medicine, [facility] EP: Dr. Graciela Husbands HF Cardiology: Dr. Shirlee Latch  HPI:  60 y.o. with hypertrophic nonobstructive cardiomyopathy (gene+) was referred by Dr. Graciela Husbands for evaluation of newly noted systolic dysfunction.  Patient has HCM as evidenced by severe asymmetric septal hypertrophy.  He does not have LVOT obstruction or SAM.  His brother had SCD at 73, presumable due to HCM, and patient has a Environmental manager ICD.  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  Patient has struggled with atrial fibrillation.  He was seen by Dr. Johney Frame a couple of years ago and decided against AF ablation.  He is currently maintained on Tikosyn.  He was on ranolazine + Tikosyn but developed VT x 2 on this combination and ranolazine was stopped.  Most recently, had DCCV 01/03/20.  He is now back in atrial fibrillation.  He feels the atrial fibrillation when his rate gets fast (>100 bpm).  Today, rate is controlled in the 70s.  Echo in 4/18 showed EF 55-60%, but echo in 9/21 showed EF down to 25-30% with moderate-severe MR (no SAM).   Patient had RHC/LHC in 12/21 showing 90% stenosis in small ostial D1 and small ostial OM2, no interventional target.  Cardiac output was preserved with normal filling pressures.  TEE showed EF 30-35% with 3+ mitral regurgitation.   Patient recently returned to HF Clinic for followup of CHF.  He was doing pretty well symptomatically. He walked a lot a work, no dyspnea walking on flat ground.  No lightheadedness.  No chest pain.  No orthopnea/PND.  Weight was stable.   Today he returns to HF clinic for pharmacist medication titration. At last visit with MD Sherryll Burger was increased to 49/51 mg BID. Overall he is doing well today. Notes occasional dizziness when he stands too quickly, but this is not new and is not bothersome. No chest pain or palpitations. No SOB/DOE. Has been trying to  increase his activity level. Walks approximately 6500 steps per day at work. Has been losing weight at home through diet/exercise. Does not take any diuretic. No LEE, PND or orthopnea. Has been following a low potassium diet as instructed. Taking all medications as prescribed and tolerating all medications.    HF Medications: Bisoprolol 5 mg BID Entresto 49/51 mg BID Empagliflozin 10 mg daily Digoxin 0.125 mg daily  Has the patient been experiencing any side effects to the medications prescribed?  no  Does the patient have any problems obtaining medications due to transportation or finances?   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 Patient understands to avoid decongestants.    Pertinent Lab Values: . 03/11/20: Serum creatinine 0.97, BUN 12, Potassium 4.3, Sodium 138  Vital Signs: . Weight: 177.2 lbs (last clinic weight: 174 lbs) . Blood pressure: 120/64  . Heart rate: 53   Assessment/Plan: 1. Chronic systolic CHF: Echo in 9/21 showed severe asymmetric septal hypertrophy, no SAM or LVOT obstruction, EF 25-30%, mildly decreased RV systolic function, PASP 60 mmHg, severe LAE, severe MR with restricted posterior leaflet.  EF was lower than in the past (normal in 4/18) and mitral regurgitation was worse.  LHC/RHC in 12/21 showed 90% stenosis small D1 and 90% stenosis small OM2 (CAD does not explain cardiomyopathy) and normal filling pressures/preserved cardiac output.  [employer] ICD.  HR seems to be generally  controlled in AF though he does have occasional RVR which may contribute to fall in EF. He has a chronic LBBB which may contribute. It could be natural history of HCM with worsening of function over time.  On exam, he is not volume overloaded. - NYHA class II symptoms.  - Not taking any diuretics.  - Continue bisoprolol 5 mg BID.  - Continue  Entresto 49/51 mg BID.  - Start spironolactone 12.5 mg daily. Repeat BMET in 1 week. Monitor carefully with history of mildly elevated K. Has been following low K diet.  - Continue empagliflozin 10 mg daily.   - Continue digoxin 0.125 mg daily.   - He may benefit from device upgrade to CRT given LBBB.  Would consider epicardial lead placement in setting of Maze/MV repair (see below).  2. Atrial fibrillation: Paroxysmal => persistent.  He had DCCV 01/03/20 but is back in atrial fibrillation now despite Tikosyn use.  He is off ranolazine due to VT with combination of Tikosyn and ranolazine. Tachy-mediated CMP could contribute to low EF, but as noted his HR seems to be < 100 bpm most of the time. Atrial fibrillation ablation likely has a lower chance of success here due to the size of the left atrium (severely enlarged).  He feels worse when he is in afib and HR is > 100.  - Continue bisoprolol 5 mg BID for rate control.  - Continue apixaban 5 mg bid.  - For now, will continue Tikosyn. QTc ok on ECG 02/28/20.  - Per Dr. Shirlee Latch, favor surgical evaluation for Maze, MV repair, and epicardial LV lead placement.  3. Mitral regurgitation: TEE in 12/21 with 3+ MR, Carpentier IIIb.   - As above, would like him evaluated for minimally invasive MV repair + maze + epicardial LV lead.  Referred to Dr. Cornelius Moras at [facility] for evaluation.   4. Hypertrophic cardiomyopathy: No SAM or LVOT obstruction, has asymmetric septal hypertrophy.  Gene+, brother with SCD at 55.   - When he returns, he is going to bring [place] his copy of the genetic testing results just to make a record of it.  5. VT: In 9/21, suspect due to combination of ranolazine and Tikosyn with QT prolongation.  Now off ranolazine.  6. CAD: LHC (12/21) with 90% ostial small D1 and 90% ostial small OM2.  CAD does not explain the extent of his cardiomyopathy.   - Continue rosuvastatin 10 mg daily - No ASA given Eliquis use.      Karle Plumber, PharmD, BCPS, BCCP,  CPP Heart Failure Clinic Pharmacist [phone]

## 2020-03-11 ENCOUNTER — Other Ambulatory Visit: Payer: Self-pay

## 2020-03-11 ENCOUNTER — Ambulatory Visit (HOSPITAL_COMMUNITY)
Admission: RE | Admit: 2020-03-11 | Discharge: 2020-03-11 | Disposition: A | Discharge status: Home / Self Care | Payer: BC Managed Care – PPO | Source: Ambulatory Visit | Attending: Internal Medicine | Admitting: Internal Medicine

## 2020-03-11 DIAGNOSIS — I340 Nonrheumatic mitral (valve) insufficiency: Secondary | ICD-10-CM | POA: Diagnosis not present

## 2020-03-11 LAB — BASIC METABOLIC PANEL
Anion gap: 8 (ref 5–15)
BUN: 12 mg/dL (ref 6–20)
CO2: 24 mmol/L (ref 22–32)
Calcium: 9.6 mg/dL (ref 8.9–10.3)
Chloride: 106 mmol/L (ref 98–111)
Creatinine, Ser: 0.97 mg/dL (ref 0.61–1.24)
GFR, Estimated: >60 mL/min (ref >60)
Glucose, Bld: 81 mg/dL (ref 70–99)
Potassium: 4.3 mmol/L (ref 3.5–5.1)
Sodium: 138 mmol/L (ref 135–145)

## 2020-03-11 LAB — ECHO TEE
MV M vel: 4.71 m/s
MV Peak grad: 88.7 mmHg
Radius: 0.5 cm

## 2020-03-19 ENCOUNTER — Telehealth: Payer: Self-pay

## 2020-03-19 ENCOUNTER — Other Ambulatory Visit: Payer: Self-pay

## 2020-03-19 ENCOUNTER — Ambulatory Visit (HOSPITAL_COMMUNITY)
Admission: RE | Admit: 2020-03-19 | Discharge: 2020-03-19 | Disposition: A | Discharge status: Home / Self Care | Payer: BC Managed Care – PPO | Source: Ambulatory Visit | Attending: Internal Medicine | Admitting: Internal Medicine

## 2020-03-19 VITALS — BP 120/64 | HR 53 | Wt 177.2 lb

## 2020-03-19 DIAGNOSIS — I480 Paroxysmal atrial fibrillation: Secondary | ICD-10-CM | POA: Diagnosis not present

## 2020-03-19 DIAGNOSIS — I4819 Other persistent atrial fibrillation: Secondary | ICD-10-CM | POA: Insufficient documentation

## 2020-03-19 DIAGNOSIS — I5022 Chronic systolic (congestive) heart failure: Secondary | ICD-10-CM | POA: Diagnosis not present

## 2020-03-19 DIAGNOSIS — I2510 Atherosclerotic heart disease of native coronary artery without angina pectoris: Secondary | ICD-10-CM | POA: Insufficient documentation

## 2020-03-19 DIAGNOSIS — Z7901 Long term (current) use of anticoagulants: Secondary | ICD-10-CM | POA: Diagnosis not present

## 2020-03-19 DIAGNOSIS — I422 Other hypertrophic cardiomyopathy: Secondary | ICD-10-CM | POA: Insufficient documentation

## 2020-03-19 DIAGNOSIS — I340 Nonrheumatic mitral (valve) insufficiency: Secondary | ICD-10-CM | POA: Insufficient documentation

## 2020-03-19 DIAGNOSIS — Z79899 Other long term (current) drug therapy: Secondary | ICD-10-CM | POA: Diagnosis not present

## 2020-03-19 MED ORDER — SPIRONOLACTONE 25 MG PO TABS: 12.5000 mg | ORAL_TABLET | Freq: Every day | ORAL | 3 refills | Status: DC

## 2020-03-19 NOTE — Telephone Encounter (Signed)
**Note De-Identified Coriana Angello Obfuscation** I started a Dofetilide PA through [employer]. Key: Lawanna Kobus

## 2020-03-19 NOTE — Patient Instructions (Signed)
It was a pleasure seeing you today!  MEDICATIONS: -We are changing your medications today -Start spironolactone 12.5 mg (1/2 tablet) daily -Call if you have questions about your medications.   NEXT APPOINTMENT: Return to clinic in 1 month with Dr. Shirlee Latch.  In general, to take care of your heart failure: -Limit your fluid intake to 2 Liters (half-gallon) per day.   -Limit your salt intake to ideally 2-3 grams (2000-3000 mg) per day. -Weigh yourself daily and record, and bring that "weight diary" to your next appointment.  (Weight gain of 2-3 pounds in 1 day typically means fluid weight.) -The medications for your heart are to help your heart and help you live longer.   -Please contact us before stopping any of your heart medications.  Call the clinic at [phone] with questions or to reschedule future appointments.

## 2020-03-21 NOTE — Telephone Encounter (Signed)
PA was approved for Dofetilide KEY: [id number] PA Case ID: [id number] Approved on March 20, 2020  Pharmacy notified

## 2020-03-25 ENCOUNTER — Encounter: Payer: Self-pay | Admitting: Thoracic Surgery (Cardiothoracic Vascular Surgery)

## 2020-03-25 ENCOUNTER — Other Ambulatory Visit: Payer: Self-pay

## 2020-03-25 ENCOUNTER — Other Ambulatory Visit: Payer: Self-pay | Admitting: Thoracic Surgery (Cardiothoracic Vascular Surgery)

## 2020-03-25 ENCOUNTER — Institutional Professional Consult (permissible substitution) (INDEPENDENT_AMBULATORY_CARE_PROVIDER_SITE_OTHER): Payer: BC Managed Care – PPO | Admitting: Thoracic Surgery (Cardiothoracic Vascular Surgery)

## 2020-03-25 VITALS — BP 118/66 | HR 63 | Resp 20 | Ht 67.0 in | Wt 168.0 lb

## 2020-03-25 DIAGNOSIS — I340 Nonrheumatic mitral (valve) insufficiency: Secondary | ICD-10-CM

## 2020-03-25 DIAGNOSIS — I5042 Chronic combined systolic (congestive) and diastolic (congestive) heart failure: Secondary | ICD-10-CM | POA: Insufficient documentation

## 2020-03-25 DIAGNOSIS — I071 Rheumatic tricuspid insufficiency: Secondary | ICD-10-CM | POA: Insufficient documentation

## 2020-03-25 NOTE — Progress Notes (Signed)
[address]       [place] [address]             [phone]     CARDIOTHORACIC SURGERY CONSULTATION REPORT  Referring Provider is Laurey Morale, MD Primary Cardiologist is Duke Salvia, MD PCP is Medicine, [facility]  Chief Complaint  Patient presents with  . Mitral Regurgitation    Initial surgical consult, ECHO and cath 02/14/20  . Atrial Fibrillation    HPI:  Patient is a 60 year old male with history of hypertrophic obstructive cardiomyopathy, ventricular tachycardia status post ICD placement, persistent atrial fibrillation, chronic combined systolic and diastolic congestive heart failure, and mitral regurgitation who has been referred for surgical consultation.  Patient states that he was first diagnosed with hypertrophic cardiomyopathy in 27-Jul-1996 after his brother died from sudden cardiac death.  He was initially cared for by Dr. Elsie Lincoln and subsequently referred to Dr. Graciela Husbands because of history of palpitations and arrhythmias.  He underwent formal genetic testing and was found to be positive for a variant of hypertrophic cardiomyopathy.  ICD was placed and since that time he has undergone 3 subsequent generator changes, most recently in 07/27/16.  His defibrillator first fired more than 10 years ago following an episode of sustained VT.    Echocardiogram performed April 2018 revealed normal left ventricular chamber size with severe focal basal and moderate concentric left ventricular hypertrophy without evidence of dynamic left ventricular outflow tract obstruction nor systolic anterior motion of the mitral valve.  There was moderate mitral regurgitation reported at that time with regurgitant volume calculated 37 mL using PISA.  There was severe left atrial enlargement.  Patient presented with atrial fibrillation at that time and underwent cardioversion at the time of his defibrillator generator change.  He has been anticoagulated using  Eliquis ever since.  Ranolazine was added but the patient had an episode of VT resulting in syncope and ICD shocks on 2 occasions within 24 hours.  The patient was admitted to the hospital and was noticing washed out.  Follow-up echocardiogram performed November 24, 2019 revealed severely decreased left ventricular systolic function with ejection fraction estimated 25 to 30%.  There remained severe asymmetric hypertrophy of the basal septum without any evidence of left ventricular outflow tract obstruction or systolic anterior motion of the mitral valve.  Mitral regurgitation appeared severe with regurgitant volume calculated 57 mL using PISA.  There was moderate to severe tricuspid regurgitation.  Atenolol was changed to bisoprolol and the patient was referred to the advanced heart failure clinic where he was evaluated by Dr. Shirlee Latch.  TEE and diagnostic cardiac catheterization were performed February 14, 2020.  TEE confirmed the presence of moderate to severe global left ventricular systolic dysfunction with ejection fraction estimated 30 to 35%.  There remained severe basal septal hypertrophy but no systolic anterior motion of the mitral valve nor dynamic left ventricular outflow tract obstruction.  There was severe left atrial enlargement.  There was moderate mitral regurgitation with 2 jets of regurgitation resulting in combined PISA ERO of 0.3 cm.  There was dampening of systolic pulmonary venous flow without flow reversal.  There was moderate tricuspid regurgitation.  Diagnostic cardiac catheterization revealed no significant coronary artery disease with normal left ventricular filling pressures and only mild pulmonary hypertension.  Cardiac output was preserved.  Cardiothoracic surgical consultation was requested.  Patient is married and lives with his wife and 2 sons in [place].  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  This does not require strenuous exertion but the  patient states that he is up and on his feet all day long.  He states that he does not experience any symptoms of exertional shortness of breath or chest discomfort.  He has had some fatigue recently but he also has been under a lot of stress over the past year since both of his parents passed away.  He is still going to work and able to work full-time with 8-hour shifts without any difficulty.  He has had some palpitations off and on in the past but really none to speak of recently.  He cannot tell whether or not he is in atrial fibrillation.  He does not experience any kind of chest pain or chest tightness either with activity or at rest.  He specifically denies any history of resting shortness of breath, PND, orthopnea, or lower extremity edema.    Past Medical History:  Diagnosis Date  . AICD (automatic cardioverter/defibrillator) present X 4  . CHF (congestive heart failure) (HCC)   . Chronic combined systolic and diastolic congestive heart failure (HCC)   . Headache    "weekly" (06/15/2017)  . History of kidney stones   . Hypertr obst cardiomyop   . ICD--[facility]    Dr. Graciela Husbands  . Mitral regurgitation   . Orthostatic lightheadedness   . Persistent atrial fibrillation (HCC)   . Severe mitral valve regurgitation by Echo 11/24/19 11/26/2019  . SYNCOPE   . TIREDNESS   . Tricuspid regurgitation   . Ventricular fibrillation/polymorphic ventricular tachycardia 06/04/2011  . Ventricular tachycardia (HCC)    rx via ICD    Past Surgical History:  Procedure Laterality Date  . CARDIAC CATHETERIZATION    . CARDIAC DEFIBRILLATOR PLACEMENT  ~ 2000; ~ 2010   "I've had 4" (06/15/2017)  . CARDIOVERSION  06/29/2016   Procedure: Cardioversion;  Surgeon: Duke Salvia, MD;  Location: [facility] INVASIVE CV LAB;  Service: Cardiovascular;;  . CARDIOVERSION N/A 06/17/2017   Procedure: CARDIOVERSION;  Surgeon: Jake Bathe, MD;  Location: [facility] ENDOSCOPY;  Service: Cardiovascular;  Laterality: N/A;  . CARDIOVERSION  N/A 07/11/2019   Procedure: CARDIOVERSION;  Surgeon: Chrystie Nose, MD;  Location: [facility] ENDOSCOPY;  Service: Cardiovascular;  Laterality: N/A;  . CARDIOVERSION N/A 01/03/2020   Procedure: CARDIOVERSION;  Surgeon: Jake Bathe, MD;  Location: [facility] ENDOSCOPY;  Service: Cardiovascular;  Laterality: N/A;  . Guidant Vitality ICD Impantation--Guidant Vitality T135--11/13/2003    . HYDROCELE EXCISION / REPAIR Left 04/2013  . ICD GENERATOR CHANGEOUT N/A 06/29/2016   Procedure: ICD Generator Changeout;  Surgeon: Duke Salvia, MD;  Location: [facility] INVASIVE CV LAB;  Service: Cardiovascular;  Laterality: N/A;  . LAPAROSCOPIC CHOLECYSTECTOMY  2003  . LITHOTRIPSY  X 1  . RIGHT/LEFT HEART CATH AND CORONARY ANGIOGRAPHY N/A 02/14/2020   Procedure: RIGHT/LEFT HEART CATH AND CORONARY ANGIOGRAPHY;  Surgeon: Laurey Morale, MD;  Location: [facility] INVASIVE CV LAB;  Service: Cardiovascular;  Laterality: N/A;  . TEE WITHOUT CARDIOVERSION N/A 02/14/2020   Procedure: TRANSESOPHAGEAL ECHOCARDIOGRAM (TEE);  Surgeon: Laurey Morale, MD;  Location: [facility] ENDOSCOPY;  Service: Cardiovascular;  Laterality: N/A;    Family History  Problem Relation Age of Onset  . ALS Father   . Hypertension Other   . Heart attack Brother     Social History   Socioeconomic History  . Marital status: Married    Spouse name: Not on file  . Number of children:  Not on file  . Years of education: Not on file  . Highest education level: Not on file  Occupational History  . Not on file  Tobacco Use  . Smoking status: Never Smoker  . Smokeless tobacco: Former Neurosurgeon    Types: Engineer, drilling  . Vaping Use: Never used  Substance and Sexual Activity  . Alcohol use: Yes    Alcohol/week: 3.0 standard drinks    Types: 3 Cans of beer per week  . Drug use: No  . Sexual activity: Yes  Other Topics Concern  . Not on file  Social History Narrative   Lives with spouse in [place]   employed   Social Determinants of Health   Financial  Resource Strain: Not on file  Food Insecurity: Not on file  Transportation Needs: Not on file  Physical Activity: Not on file  Stress: Not on file  Social Connections: Not on file  Intimate Partner Violence: Not on file    Current Outpatient Medications  Medication Sig Dispense Refill  . acetaminophen (TYLENOL) 325 MG tablet Take 650 mg by mouth every 6 (six) hours as needed (for pain.).    Marland Kitchen bisoprolol (ZEBETA) 5 MG tablet TAKE 1 TABLET (5 MG TOTAL) BY MOUTH IN THE MORNING AND AT BEDTIME. 180 tablet 3  . digoxin (LANOXIN) 0.125 MG tablet Take 1 tablet (0.125 mg total) by mouth daily. 90 tablet 3  . dofetilide (TIKOSYN) 500 MCG capsule Take 1 capsule (500 mcg total) by mouth 2 (two) times daily. 60 capsule 12  . ELIQUIS 5 MG TABS tablet TAKE 1 TABLET BY MOUTH TWICE A DAY 180 tablet 6  . empagliflozin (JARDIANCE) 10 MG TABS tablet Take 1 tablet (10 mg total) by mouth daily before breakfast. 90 tablet 3  . loratadine (CLARITIN) 10 MG tablet Take 10 mg by mouth daily as needed for allergies.     . magnesium oxide (MAG-OX) 400 MG tablet Take 1 tablet (400 mg total) by mouth daily. 30 tablet 6  . rosuvastatin (CRESTOR) 10 MG tablet Take 1 tablet (10 mg total) by mouth daily. 30 tablet 11  . sacubitril-valsartan (ENTRESTO) 49-51 MG Take 1 tablet by mouth 2 (two) times daily. 60 tablet 11  . spironolactone (ALDACTONE) 25 MG tablet Take 0.5 tablets (12.5 mg total) by mouth daily. 45 tablet 3   No current facility-administered medications for this visit.    No Known Allergies    Review of Systems:   General:  normal appetite, decreased energy, no weight gain, no weight loss, no fever  Cardiac:  no chest pain with exertion, no chest pain at rest, no SOB with exertion, no resting SOB, no PND, no orthopnea, + palpitations, + arrhythmia, + atrial fibrillation, no LE edema, no dizzy spells, no syncope  Respiratory:  no shortness of breath, no home oxygen, no productive cough, no dry cough, no  bronchitis, no wheezing, no hemoptysis, no asthma, no pain with inspiration or cough, no sleep apnea, no CPAP at night  GI:   no difficulty swallowing, n reflux, no frequent heartburn, no hiatal hernia, no abdominal pain, no constipation, no diarrhea, no hematochezia, no hematemesis, no melena  GU:   no dysuria,  no frequency, no urinary tract infection, no hematuria, no enlarged prostate, no kidney stones, no kidney disease  Vascular:  no pain suggestive of claudication, no pain in feet, occasional leg cramps, no varicose veins, no DVT, no non-healing foot ulcer  Neuro:   no stroke, no TIA's, no  seizures, no headaches, no temporary blindness one eye,  no slurred speech, no peripheral neuropathy, no chronic pain, no instability of gait, no memory/cognitive dysfunction  Musculoskeletal: no arthritis, no joint swelling, no myalgias, no difficulty walking, normal mobility   Skin:   no rash, no itching, no skin infections, no pressure sores or ulcerations  Psych:   no anxiety, no depression, no nervousness, + unusual recent stress  Eyes:   no blurry vision, no floaters, no recent vision changes, + wears glasses or contacts  ENT:   no hearing loss, no loose or painful teeth, no dentures, last saw dentist 2020  Hematologic:  no easy bruising, no abnormal bleeding, no clotting disorder, no frequent epistaxis  Endocrine:  no diabetes, does not check CBG's at home     Physical Exam:   BP 118/66 (BP Location: Right Arm, Patient Position: Sitting)   Pulse 63   Resp 20   Ht 5\' 7"  (1.702 m)   Wt 168 lb (76.2 kg)   SpO2 96% Comment: RA with mask on  BMI 26.31 kg/m   General:  Thin,  well-appearing  HEENT:  Unremarkable   Neck:   no JVD, no bruits, no adenopathy   Chest:   clear to auscultation, symmetrical breath sounds, no wheezes, no rhonchi   CV:   Irregular rate and rhythm, no definite murmur appreciated  Abdomen:  soft, non-tender, no masses   Extremities:  warm, well-perfused, pulses palpabld,  no LE edema  Rectal/GU  Deferred  Neuro:   Grossly non-focal and symmetrical throughout  Skin:   Clean and dry, no rashes, no breakdown   Diagnostic Tests:   ECHOCARDIOGRAM REPORT       Patient Name:  LOTTIE LINGREN Date of Exam: 11/24/2019  Medical Rec #: [id number]   Height:    67.0 in  Accession #:  [id number]  Weight:    185.4 lb  Date of Birth: 1960-05-06  BSA:     1.958 m  Patient Age:  58 years   BP:      135/90 mmHg  Patient Gender: M       HR:      55 bpm.  Exam Location: Inpatient   Procedure: 2D Echo, Cardiac Doppler, Color Doppler and Intracardiac       Opacification Agent   Indications:  Ventricular Tachycardia I47.2    History:    Patient has prior history of Echocardiogram examinations,  most         recent 06/16/2016. Cardiomyopathy; Signs/Symptoms:Syncope  and         Dizziness/Lightheadedness.    Sonographer:  Eulah Pont RDCS  Referring Phys: [id number] MICHAEL ANDREW TILLERY   IMPRESSIONS    1. Severe asymmetic hypertrophy of the basal septum up to 1.8 cm.  Findings consistent with patient's history of hypertrophic cardiomyopathy,  sigmoid variant. No LVOT obstruction or mitral SAM. Left ventricular  ejection fraction, by estimation, is 25 to  30%. The left ventricle has severely decreased function. The left  ventricle demonstrates global hypokinesis. There is severe asymmetric left  ventricular hypertrophy of the basal-septal segment. Left ventricular  diastolic function could not be evaluated.  2. Right ventricular systolic function is mildly reduced. The right  ventricular size is mildly enlarged. There is moderately elevated  pulmonary artery systolic pressure. The estimated right ventricular  systolic pressure is 60.1 mmHg.  3. Left atrial size was severely dilated.  4. Right atrial size was moderately dilated.  5. Severe mitral regurgitation due to  restricted PMVL (IIIB). 2D ERO 0.37  cm2, with R vol 57, RF 58%. The mitral valve is degenerative. Severe  mitral valve regurgitation. No evidence of mitral stenosis.  6. Tricuspid valve regurgitation is moderate to severe.  7. The aortic valve is tricuspid. There is mild thickening of the aortic  valve. Aortic valve regurgitation is mild. No aortic stenosis is present.  8. The inferior vena cava is dilated in size with >50% respiratory  variability, suggesting right atrial pressure of 8 mmHg.   Comparison(s): Changes from prior study are noted. EF ~25-30%. Severe MR.  Moderate to Severe TR. HCM.   FINDINGS  Left Ventricle: Severe asymmetic hypertrophy of the basal septum up to  1.8 cm. Findings consistent with patient's history of hypertrophic  cardiomyopathy, sigmoid variant. No LVOT obstruction or mitral SAM. Left  ventricular ejection fraction, by  estimation, is 25 to 30%. The left ventricle has severely decreased  function. The left ventricle demonstrates global hypokinesis. Definity  contrast agent was given IV to delineate the left ventricular endocardial  borders. The left ventricular internal  cavity size was normal in size. There is severe asymmetric left  ventricular hypertrophy of the basal-septal segment. Abnormal  (paradoxical) septal motion, consistent with left bundle branch block.  Left ventricular diastolic function could not be  evaluated due to mitral regurgitation (moderate or greater). Left  ventricular diastolic function could not be evaluated.   Right Ventricle: The right ventricular size is mildly enlarged. No  increase in right ventricular wall thickness. Right ventricular systolic  function is mildly reduced. There is moderately elevated pulmonary artery  systolic pressure. The tricuspid  regurgitant velocity is 3.61 m/s, and with an assumed right atrial  pressure of 8 mmHg, the estimated right ventricular systolic pressure is  60.1 mmHg.   Left  Atrium: Left atrial size was severely dilated.   Right Atrium: Right atrial size was moderately dilated.   Pericardium: Trivial pericardial effusion is present.   Mitral Valve: Severe mitral regurgitation due to restricted PMVL (IIIB).  2D ERO 0.37 cm2, with R vol 57, RF 58%. The mitral valve is degenerative  in appearance. Mild mitral annular calcification. Severe mitral valve  regurgitation, with centrally-directed  jet. No evidence of mitral valve stenosis.   Tricuspid Valve: The tricuspid valve is grossly normal. Tricuspid valve  regurgitation is moderate to severe. No evidence of tricuspid stenosis.   Aortic Valve: The aortic valve is tricuspid. There is mild thickening of  the aortic valve. Aortic valve regurgitation is mild. Aortic regurgitation  PHT measures 607 msec. No aortic stenosis is present.   Pulmonic Valve: The pulmonic valve was grossly normal. Pulmonic valve  regurgitation is trivial. No evidence of pulmonic stenosis.   Aorta: The aortic root and ascending aorta are structurally normal, with  no evidence of dilitation.   Venous: The inferior vena cava is dilated in size with greater than 50%  respiratory variability, suggesting right atrial pressure of 8 mmHg.   IAS/Shunts: The atrial septum is grossly normal.   Additional Comments: A pacer wire is visualized in the right atrium and  right ventricle.     LEFT VENTRICLE  PLAX 2D  LVIDd:     4.90 cm   Diastology  LVIDs:     3.50 cm   LV e' medial:  4.67 cm/s  LV PW:     1.40 cm   LV E/e' medial: 23.3  LV IVS:    1.30 cm   LV e' lateral:  6.92 cm/s  LVOT diam:   2.10 cm   LV E/e' lateral: 15.8  LV SV:     41  LV SV Index:  21  LVOT Area:   3.46 cm    LV Volumes (MOD)  LV vol d, MOD A2C: 166.0 ml  LV vol d, MOD A4C: 168.0 ml  LV vol s, MOD A2C: 90.9 ml  LV vol s, MOD A4C: 117.0 ml  LV SV MOD A2C:   75.1 ml  LV SV MOD A4C:   168.0 ml  LV SV MOD BP:    63.0 ml   RIGHT VENTRICLE  RV S prime:   11.20 cm/s  TAPSE (M-mode): 1.8 cm   LEFT ATRIUM       Index    RIGHT ATRIUM      Index  LA diam:    5.80 cm 2.96 cm/m RA Area:   27.80 cm  LA Vol (A2C):  135.0 ml 68.94 ml/m RA Volume:  88.10 ml 44.99 ml/m  LA Vol (A4C):  193.0 ml 98.56 ml/m  LA Biplane Vol: 175.0 ml 89.37 ml/m  AORTIC VALVE  LVOT Vmax:  69.60 cm/s  LVOT Vmean: 51.100 cm/s  LVOT VTI:  0.118 m  AI PHT:   607 msec    AORTA  Ao Root diam: 3.00 cm  Ao Asc diam: 3.20 cm   MITRAL VALVE         TRICUSPID VALVE  MV Area (PHT): 4.39 cm   TR Peak grad:  52.1 mmHg  MV Decel Time: 173 msec   TR Vmax:    361.00 cm/s  MR Peak grad:  74.3 mmHg  MR Mean grad:  46.0 mmHg  SHUNTS  MR Vmax:     431.00 cm/s Systemic VTI: 0.12 m  MR Vmean:    314.0 cm/s Systemic Diam: 2.10 cm  MR PISA:     3.53 cm  MR PISA Eff ROA: 33 mm  MR PISA Radius: 0.75 cm  MV E velocity: 109.00 cm/s  MV A velocity: 51.00 cm/s  MV E/A ratio: 2.14   Lennie Odor MD  Electronically signed by Lennie Odor MD  Signature Date/Time: 11/24/2019/3:00:17 PM       TRANSESOPHOGEAL ECHO REPORT       Patient Name:  Herbert Moors Date of Exam: 02/14/2020  Medical Rec #: [id number]   Height:    67.0 in  Accession #:  [id number]  Weight:    175.8 lb  Date of Birth: [birth year]  BSA:     1.914 m  Patient Age:  59 years   BP:      133/96 mmHg  Patient Gender: M       HR:      68 bpm.  Exam Location: Outpatient   Procedure: Color Doppler, Cardiac Doppler, 3D Echo and Transesophageal  Echo   Indications:   Mitral regurgitation    History:     Patient has prior history of Echocardiogram examinations,  most          recent 11/24/2019.    Sonographer:   Margreta Journey  Referring Phys: [id number] Laurey Morale  Diagnosing Phys: Marca Ancona MD   PROCEDURE:  After discussion of the risks and benefits of a TEE, an  informed consent was obtained from the patient. The transesophogeal probe  was passed without difficulty through the esophogus of the patient.  Sedation performed by performing physician.  Patients was under conscious sedation during this procedure. Anesthetic  administered: of Fentanyl, 4.0mg  of  Versed. The patient developed no  complications during the procedure.   IMPRESSIONS    1. Left ventricular ejection fraction, by estimation, is 30 to 35%. The  left ventricle has moderately decreased function. The left ventricle  demonstrates global hypokinesis. There is severe basal septal left  ventricular hypertrophy. No mitral valve SAM  or LVOT gradient noted. Patient has known history of hypertrophic  cardiomyopathy.  2. Right ventricular systolic function is mildly reduced. The right  ventricular size is normal.  3. Left atrial size was severely dilated. No left atrial/left atrial  appendage thrombus was detected.  4. Right atrial size was moderately dilated.  5. The posterior mitral leaflet is thickened and restricted. There are 2  jets of mitral regurgitation, combined PISA ERO is 0.3 cm^2. The pulmonary  vein systolic doppler signal is flattened but not reversed. 3+ mitral  regurgitation, suspect Carpentier  IIIb. No evidence of mitral stenosis.  6. Peak RV-RA gradient 30 mmHg. Tricuspid valve regurgitation is  moderate.  7. The aortic valve is tricuspid. Aortic valve regurgitation is mild. No  aortic stenosis is present.  8. No ASD or PFO by color doppler.  9. Normal caliber thoracic aorta with minimal plaque.   FINDINGS  Left Ventricle: Left ventricular ejection fraction, by estimation, is 30  to 35%. The left ventricle has moderately decreased function. The left  ventricle demonstrates global hypokinesis. The left ventricular internal  cavity size was normal in size.  There is severe basal septal left  ventricular hypertrophy.   Right Ventricle: The right ventricular size is normal. No increase in  right ventricular wall thickness. Right ventricular systolic function is  mildly reduced.   Left Atrium: Left atrial size was severely dilated. No left atrial/left  atrial appendage thrombus was detected.   Right Atrium: Right atrial size was moderately dilated.   Pericardium: There is no evidence of pericardial effusion.   Mitral Valve: The posterior mitral leaflet is thickened and restricted.  There are 2 jets of mitral regurgitation, combined PISA ERO is 0.3 cm^2.  The pulmonary vein systolic doppler signal is flattened but not reversed.  3+ mitral regurgitation, suspect  Carpentier IIIb. The mitral valve is abnormal. Moderate to severe mitral  valve regurgitation. No evidence of mitral valve stenosis.   Tricuspid Valve: Peak RV-RA gradient 30 mmHg. The tricuspid valve is  normal in structure. Tricuspid valve regurgitation is moderate.   Aortic Valve: The aortic valve is tricuspid. Aortic valve regurgitation is  mild. No aortic stenosis is present.   Pulmonic Valve: The pulmonic valve was normal in structure. Pulmonic valve  regurgitation is not visualized.   Aorta: Normal caliber thoracic aorta with minimal plaque. The aortic root  is normal in size and structure.   IAS/Shunts: No ASD or PFO by color doppler.     MR Peak grad:  88.7 mmHg  TRICUSPID VALVE  MR Mean grad:  57.3 mmHg  TR Peak grad:  30.0 mmHg  MR Vmax:     471.00 cm/s TR Vmax:    274.00 cm/s  MR Vmean:    355.7 cm/s  MR PISA:     1.57 cm  MR PISA Eff ROA: 12 mm  MR PISA Radius: 0.50 cm   Marca Ancona MD  Electronically signed by Marca Ancona MD  Signature Date/Time: 03/11/2020/3:46:16 PM       RIGHT/LEFT HEART CATH AND CORONARY ANGIOGRAPHY    Conclusion  1. Nonsignificant coronary disease.  Nonischemic cardiomyopathy.  2. Normal filling pressures with mild pulmonary  hypertension.  3. Preserved cardiac output.   Surgeon Notes    02/14/2020 11:22 AM CV Procedure signed by Laurey Morale, MD    Procedural Details  Technical Details Procedure: Right Heart Cath, Left Heart Cath, Selective Coronary Angiography  Indication: Mitral regurgitation, cardiomyopathy.    Procedural Details: The right brachial and radial areas and the right groin were prepped, draped, and anesthetized with 1% lidocaine. There was a pre-existing peripheral brachial IV that was replaced with a 49F venous sheath.  The radial artery and ulnar arteries were both accessed using ultrasound guidance, but both arteries were very small and I was unable to pass the wire.  Therefore, using the modified Seldinger technique a 5 French sheath was placed in the right femoral artery. A Swan-Ganz catheter was used for the right heart catheterization. Standard protocol was followed for recording of right heart pressures and sampling of oxygen saturations. Fick cardiac output was calculated. Standard Judkins catheters were used for selective coronary angiography.  Mynx closure device used. There were no immediate procedural complications. The patient was transferred to the post catheterization recovery area for further monitoring.  Estimated blood loss <50 mL.   During this procedure medications were administered to achieve and maintain moderate conscious sedation while the patient's heart rate, blood pressure, and oxygen saturation were continuously monitored and I was present face-to-face 100% of this time.   Medications (Filter: Administrations occurring from 1127 to 1242 on 02/14/20) (important) Continuous medications are totaled by the amount administered until 02/14/20 1242.    Heparin (Porcine) in NaCl 1000-0.9 UT/500ML-% SOLN (mL) Total volume:  500 mL  Date/Time Rate/Dose/Volume Action   02/14/20 1137 500 mL Given    lidocaine (PF) (XYLOCAINE) 1 % injection (mL) Total volume:  20  mL  Date/Time Rate/Dose/Volume Action   02/14/20 1147 1 mL Given   1157 2 mL Given   1207 2 mL Given   1217 15 mL Given    fentaNYL (SUBLIMAZE) injection (mcg) Total dose:  50 mcg  Date/Time Rate/Dose/Volume Action   02/14/20 1148 25 mcg Given   1215 25 mcg Given    midazolam (VERSED) injection (mg) Total dose:  2 mg  Date/Time Rate/Dose/Volume Action   02/14/20 1148 1 mg Given   1215 1 mg Given    Heparin (Porcine) in NaCl 1000-0.9 UT/500ML-% SOLN (mL) Total volume:  500 mL  Date/Time Rate/Dose/Volume Action   02/14/20 1202 500 mL Given    iohexol (OMNIPAQUE) 350 MG/ML injection (mL) Total volume:  45 mL  Date/Time Rate/Dose/Volume Action   02/14/20 1240 45 mL Given    Sedation Time  Sedation Time Physician-1: 39 minutes 41 seconds   Contrast  Medication Name Total Dose  iohexol (OMNIPAQUE) 350 MG/ML injection 45 mL    Radiation/Fluoro  Fluoro time: 4.9 (min) DAP: 16865 (mGycm2) Cumulative Air Kerma: 328 (mGy)   Coronary Findings   Diagnostic Dominance: Right  Left Main  No significant disease.  Left Anterior Descending  30% stenosis in the proximal LAD. 90% ostial stenosis small D1.  Ramus Intermedius  Large ramus. 30% ostial stenosis.  Left Circumflex  Relatively small AV LCx. Small OM2 with 90% ostial stenosis.  Right Coronary Artery  Luminal irregularities.   Intervention   No interventions have been documented.  Right Heart  Right Heart Pressures RHC Procedural Findings: Hemodynamics (mmHg) RA mean 3 RV 34/4 PA 38/14, mean 25 PCWP mean 14 with V waves to 22 LV 109/11 AO 110/67  Oxygen saturations: PA 76% AO 100%  Cardiac Output (Fick) 5.1  Cardiac Index (Fick) 2.66 PVR 2.1 WU   Implants    Vascular Products   Closure Mynx Control 39f - [id number] - Implanted  Inventory item: CLOSURE [facility] CONTROL 2F Model/Cat number: [id number]  Manufacturer: [employer] Lot number: [id number]  Device identifier:  [id number] Device identifier type: GS1   GUDID Information  Request status Successful    Brand name: MYNX CONTROL Version/Model: DI2641  Company name: [employer] MRI safety info as of 02/14/20: MR Safe  Contains dry or latex rubber: No    GMDN P.T. name: Wound hydrogel dressing, non-antimicrobial     As of 02/14/2020  Status: Implanted        Syngo Images  Show images for CARDIAC CATHETERIZATION  Images on Long Term Storage  Show images for [place], [place]  Link to Procedure Log  Procedure Log     Hemo Data  Flowsheet Row Most Recent Value  Fick Cardiac Output 5.1 L/min  Fick Cardiac Output Index 2.66 (L/min)/BSA  RA A Wave 5 mmHg  RA V Wave 5 mmHg  RA Mean 3 mmHg  RV Systolic Pressure 34 mmHg  RV Diastolic Pressure 1 mmHg  RV EDP 4 mmHg  PA Systolic Pressure 38 mmHg  PA Diastolic Pressure 14 mmHg  PA Mean 25 mmHg  PW A Wave 15 mmHg  PW V Wave 22 mmHg  PW Mean 14 mmHg  AO Systolic Pressure 110 mmHg  AO Diastolic Pressure 67 mmHg  AO Mean 83 mmHg  LV Systolic Pressure 109 mmHg  LV Diastolic Pressure 8 mmHg  LV EDP 11 mmHg  AOp Systolic Pressure 110 mmHg  AOp Diastolic Pressure 69 mmHg  AOp Mean Pressure 87 mmHg  LVp Systolic Pressure 107 mmHg  LVp Diastolic Pressure 6 mmHg  LVp EDP Pressure 9 mmHg  QP/QS 1  TPVR Index 9.4 HRUI  TSVR Index 30.07 HRUI  PVR SVR Ratio 0.14  TPVR/TSVR Ratio 0.31     EKG: Atrial fibrillation w/ LBBB, QRS duration 144 ms    Impression:  Patient has history of hypertrophic cardiomyopathy, recurrent ventricular tachycardia, persistent atrial fibrillation that has failed drug therapy and cardioversion on multiple occasions, left bundle branch block, and nonischemic cardiomyopathy associated with at least moderate and likely at times severe asymptomatic secondary mitral regurgitation.  At present he denies any recent history of symptoms of exertional shortness of breath, resting shortness of breath, PND,  orthopnea, or lower extremity edema.  He does have some history of fatigue although he is working full-time and on his feet all day at work.  He has been under considerable stress at home.  He does not experience significant symptoms of atypical chest pain or palpitations related to his underlying persistent atrial fibrillation.  He is tolerating oral anticoagulation therapy without any significant bleeding complications or history of embolic events.  I have personally reviewed the patient's recent transthoracic and transesophageal echocardiograms as well as his diagnostic cardiac catheterization.  The patient does have severe asymmetric septal hypertrophy but there is no history of significant dynamic left ventricular outflow tract obstruction nor systolic anterior motion of the mitral valve.  The patient's mitral regurgitation appears to be likely secondary and related to a combination of nonischemic cardiomyopathy with functional restriction of the posterior leaflet (type IIIb) as well as severe left atrial enlargement with likely annular dilatation (type I).  Under the circumstances the severity of mitral regurgitation is likely dynamic and may improve with continued optimal medical  therapy with or without resynchronization therapy.  Furthermore, at this time the patient remains essentially asymptomatic.  However, I agree that an attempt at catheter-based ablation for treatment of the patient's mitral regurgitation would likely be unsuccessful given the size of the left atrium and the severity of mitral regurgitation.  Options include continued close follow-up on guideline directed medical therapy, upgrade of the patient's defibrillator to dual-chamber pacemaker for biventricular pacing, or surgical intervention to include mitral and tricuspid valve annuloplasty with Maze procedure and implantation of epicardial pacemaker leads for dual-chamber pacing.  Despite the presence of significant left ventricular  systolic and diastolic dysfunction I suspect the patient would probably do reasonably well with surgery, and this would be associated with the added benefit of a chance for restoration of sinus rhythm.  However, I am somewhat reluctant to consider surgery at this time given the fact that the patient claims to be essentially asymptomatic.   Plan:  I discussed matters at length with the patient and his wife in the office today.  We discussed the role of surgery in patients with secondary mitral regurgitation as well as those with medically refractory atrial fibrillation.  Alternative treatment strategies admit discussed in detail and reviewed over the telephone with Dr. Shirlee Latch and Dr. Graciela Husbands.  As a next step the patient will undergo formal cardiopulmonary exercise testing with stress echocardiography to further evaluate whether or not the patient has significant functional limitation related to his underlying chronic combined systolic and diastolic congestive heart failure.  If stress testing and stress echocardiography documents the presence of significant functional impairment and/or worsening mitral regurgitation with exercise, an aggressive surgical approach might be warranted given the patient's relatively young age.  On the other hand, it might be reasonable to consider proceeding first with upgrade of the patient's defibrillator to biventricular pacemaker.  This might be associated with significant improvement in the patient's mitral regurgitation and left ventricular systolic dysfunction.  Patient will return to our office in 3 weeks once his stress testing has been completed.  All questions answered.   I spent in excess of 90 minutes during the conduct of this office consultation and >50% of this time involved direct face-to-face encounter with the patient for counseling and/or coordination of their care.    Salvatore Decent. Cornelius Moras, MD 03/25/2020 1:06 PM

## 2020-03-25 NOTE — Patient Instructions (Signed)
Continue all previous medications without any changes at this time  Consider getting vaccinated for the COVID19 virus

## 2020-03-26 ENCOUNTER — Other Ambulatory Visit (HOSPITAL_COMMUNITY): Payer: BC Managed Care – PPO

## 2020-03-27 ENCOUNTER — Ambulatory Visit (HOSPITAL_COMMUNITY)
Admission: RE | Admit: 2020-03-27 | Discharge: 2020-03-27 | Disposition: A | Discharge status: Home / Self Care | Payer: BC Managed Care – PPO | Source: Ambulatory Visit | Attending: Cardiology | Admitting: Cardiology

## 2020-03-27 ENCOUNTER — Other Ambulatory Visit: Payer: Self-pay

## 2020-03-27 DIAGNOSIS — E785 Hyperlipidemia, unspecified: Secondary | ICD-10-CM

## 2020-03-27 DIAGNOSIS — I4901 Ventricular fibrillation: Secondary | ICD-10-CM | POA: Diagnosis not present

## 2020-03-27 DIAGNOSIS — I10 Essential (primary) hypertension: Secondary | ICD-10-CM | POA: Diagnosis not present

## 2020-03-27 DIAGNOSIS — Z23 Encounter for immunization: Secondary | ICD-10-CM | POA: Diagnosis not present

## 2020-03-27 DIAGNOSIS — I422 Other hypertrophic cardiomyopathy: Secondary | ICD-10-CM | POA: Diagnosis not present

## 2020-03-27 DIAGNOSIS — Z6826 Body mass index (BMI) 26.0-26.9, adult: Secondary | ICD-10-CM | POA: Diagnosis not present

## 2020-03-27 DIAGNOSIS — Z0000 Encounter for general adult medical examination without abnormal findings: Secondary | ICD-10-CM | POA: Diagnosis not present

## 2020-03-27 DIAGNOSIS — I480 Paroxysmal atrial fibrillation: Secondary | ICD-10-CM | POA: Diagnosis not present

## 2020-03-27 LAB — HEPATIC FUNCTION PANEL
ALT: 18 U/L (ref 0–44)
AST: 22 U/L (ref 15–41)
Albumin: 3.9 g/dL (ref 3.5–5.0)
Alkaline Phosphatase: 50 U/L (ref 38–126)
Bilirubin, Direct: 0.3 mg/dL — ABNORMAL HIGH (ref 0.0–0.2)
Indirect Bilirubin: 1.6 mg/dL — ABNORMAL HIGH (ref 0.3–0.9)
Total Bilirubin: 1.9 mg/dL — ABNORMAL HIGH (ref 0.3–1.2)
Total Protein: 6.6 g/dL (ref 6.5–8.1)

## 2020-03-27 LAB — LIPID PANEL
Cholesterol: 152 mg/dL (ref 0–200)
HDL: 63 mg/dL (ref >40)
LDL Cholesterol: 74 mg/dL (ref 0–99)
Total CHOL/HDL Ratio: 2.4 RATIO
Triglycerides: 75 mg/dL (ref <150)
VLDL: 15 mg/dL (ref 0–40)

## 2020-04-15 ENCOUNTER — Ambulatory Visit: Payer: BC Managed Care – PPO | Admitting: Thoracic Surgery (Cardiothoracic Vascular Surgery)

## 2020-04-16 ENCOUNTER — Encounter (HOSPITAL_COMMUNITY): Payer: Self-pay | Admitting: Cardiology

## 2020-04-16 ENCOUNTER — Other Ambulatory Visit: Payer: Self-pay

## 2020-04-16 ENCOUNTER — Ambulatory Visit (HOSPITAL_COMMUNITY)
Admission: RE | Admit: 2020-04-16 | Discharge: 2020-04-16 | Disposition: A | Discharge status: Home / Self Care | Payer: BC Managed Care – PPO | Source: Ambulatory Visit | Attending: Cardiology | Admitting: Cardiology

## 2020-04-16 VITALS — BP 112/70 | HR 53 | Wt 173.0 lb

## 2020-04-16 DIAGNOSIS — Z79899 Other long term (current) drug therapy: Secondary | ICD-10-CM | POA: Diagnosis not present

## 2020-04-16 DIAGNOSIS — I340 Nonrheumatic mitral (valve) insufficiency: Secondary | ICD-10-CM | POA: Insufficient documentation

## 2020-04-16 DIAGNOSIS — I422 Other hypertrophic cardiomyopathy: Secondary | ICD-10-CM | POA: Diagnosis not present

## 2020-04-16 DIAGNOSIS — I480 Paroxysmal atrial fibrillation: Secondary | ICD-10-CM | POA: Diagnosis not present

## 2020-04-16 DIAGNOSIS — I2510 Atherosclerotic heart disease of native coronary artery without angina pectoris: Secondary | ICD-10-CM | POA: Diagnosis not present

## 2020-04-16 DIAGNOSIS — Z95810 Presence of automatic (implantable) cardiac defibrillator: Secondary | ICD-10-CM | POA: Insufficient documentation

## 2020-04-16 DIAGNOSIS — I5022 Chronic systolic (congestive) heart failure: Secondary | ICD-10-CM | POA: Insufficient documentation

## 2020-04-16 DIAGNOSIS — I4819 Other persistent atrial fibrillation: Secondary | ICD-10-CM | POA: Diagnosis not present

## 2020-04-16 DIAGNOSIS — Z7901 Long term (current) use of anticoagulants: Secondary | ICD-10-CM | POA: Diagnosis not present

## 2020-04-16 DIAGNOSIS — I5042 Chronic combined systolic (congestive) and diastolic (congestive) heart failure: Secondary | ICD-10-CM | POA: Diagnosis not present

## 2020-04-16 LAB — BASIC METABOLIC PANEL
Anion gap: 8 (ref 5–15)
BUN: 17 mg/dL (ref 6–20)
CO2: 22 mmol/L (ref 22–32)
Calcium: 9.2 mg/dL (ref 8.9–10.3)
Chloride: 106 mmol/L (ref 98–111)
Creatinine, Ser: 0.84 mg/dL (ref 0.61–1.24)
GFR, Estimated: >60 mL/min (ref >60)
Glucose, Bld: 90 mg/dL (ref 70–99)
Potassium: 4.2 mmol/L (ref 3.5–5.1)
Sodium: 136 mmol/L (ref 135–145)

## 2020-04-16 LAB — DIGOXIN LEVEL: Digoxin Level: 0.9 ng/mL (ref 0.8–2.0)

## 2020-04-16 MED ORDER — SPIRONOLACTONE 25 MG PO TABS: 25.0000 mg | ORAL_TABLET | Freq: Every day | ORAL | 3 refills | Status: DC

## 2020-04-16 NOTE — Patient Instructions (Addendum)
Labs done today. We will contact you only if your labs are abnormal.  INCREASE Spironolactone to 25mg  (1 tablet) by mouth daily.  No other medication changes were made. Please continue all current medications as prescribed.  Your physician recommends that you schedule a follow-up appointment in: 10 days for a lab only appointment and in 3 months for an appointment with Dr.  If you have any questions or concerns before your next appointment please send Shirlee Latch a message through [facility] or call our office at [phone].    TO LEAVE A MESSAGE FOR THE NURSE SELECT OPTION 2, PLEASE LEAVE A MESSAGE INCLUDING: . YOUR NAME . DATE OF BIRTH . CALL BACK NUMBER . REASON FOR CALL**this is important as we prioritize the call backs  YOU WILL RECEIVE A CALL BACK THE SAME DAY AS LONG AS YOU CALL BEFORE 4:00 PM   Do the following things EVERYDAY: 1) Weigh yourself in the morning before breakfast. Write it down and keep it in a log. 2) Take your medicines as prescribed 3) Eat low salt foods--Limit salt (sodium) to 2000 mg per day.  4) Stay as active as you can everyday 5) Limit all fluids for the day to less than 2 liters   At the [facility], you and your health needs are our priority. As part of our continuing mission to provide you with exceptional heart care, we have created designated Provider Care Teams. These Care Teams include your primary Cardiologist (physician) and Advanced Practice Providers (APPs- Physician Assistants and Nurse Practitioners) who all work together to provide you with the care you need, when you need it.   You may see any of the following providers on your designated Care Team at your next follow up: [phone] Dr Marland Kitchen . Dr Arvilla Meres . Marca Ancona, NP . Tonye Becket, PA . Robbie Lis, PharmD   Please be sure to bring in all your medications bottles to every appointment.

## 2020-04-16 NOTE — Progress Notes (Signed)
PCP: Medicine, [facility] EP: Dr. Graciela Husbands HF Cardiology: Dr. Shirlee Latch  60 y.o. with hypertrophic nonobstructive cardiomyopathy (gene+) was referred by Dr. Graciela Husbands for evaluation of newly noted systolic dysfunction.  Patient has HCM as evidenced by severe asymmetric septal hypertrophy.  He does not have LVOT obstruction or SAM.  His brother had SCD at 39, presumable due to HCM, and patient has a Environmental manager ICD.  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  Patient has struggled with atrial fibrillation.  He was seen by Dr. Johney Frame a couple of years ago and decided against AF ablation.  He is currently maintained on Tikosyn.  He was on ranolazone + Tikosyn but developed VT x 2 on this combination and ranolazine was stopped.  Most recently, had DCCV 01/03/20.  He is now back in atrial fibrillation.  He feels the atrial fibrillation when his rate gets fast (>100 bpm).  Today, rate is controlled in the 70s.  Echo in 4/18 showed EF 55-60%, but echo in 9/21 showed EF down to 25-30% with moderate-severe MR (no SAM).   Patient had RHC/LHC in 12/21 showing 90% stenosis in small ostial D1 and small ostial OM2, no interventional target.  Cardiac output was preserved with normal filling pressures.  TEE showed EF 30-35% with 3+ mitral regurgitation.   Patient saw Dr. Cornelius Moras for evaluation for MV repair, surgical Maze, epicardial LV lead.  He will need further workup to figure out the best path here, stress echo and CPX were ordered.   Patient returns for followup of CHF.  He is short of breath walking up stairs.  No dyspnea walking on flat ground. No orthopnea/PND.  Weight is down 1 lb.  No chest pain.  No lightheadedness/syncope.   ECG (personally reviewed): atypical atrial flutter, LBBB (148 msec), QTc 501 msec  [employer] ICD interrogation: 8 NSVT runs since last check.   Labs (10/21): K 4.3,  creatinine 0.75 Labs (11/21): K 5.1, creatinine 0.9 Labs (1/22): LDL 74, HDL 63, K 4.3, creatinine 1.44  PMH: 1. Atrial fibrillation: Paroxysmal.  Was on ranolazine + Tikosyn but had VT, ranolazine stopped.  Now on Tikosyn.  - Most recent DCCV 11/21. 2. Hypertrophic cardiomyopathy: No LVOT obstruction or SAM.  Gene+.  Brother with SCD at 70.  - [employer] ICD - Echo (4/18): Severe asymmetric septal hypertrophy, no SAM, moderate MR, EF 55-60%.  - Echo (9/21): Severe asymmetric septal hypertrophy, no SAM or LVOT obstruction, EF 25-30%, mildly decreased RV systolic function, PASP 60 mmHg, severe LAE, severe MR with restricted posterior leaflet.  - TEE (12/21): severe asymmetric basal septal hypertrophy, EF 30-35%, mildly decreased RV systolic function, severe LAE, moderate TR, mild AI, restricted posterior MV leaflet with 2 MR jets, ERO 0.3 (suspect 3+ MR, Carpentier IIIb).  3. Chronic systolic CHF: Echo (9/21): Severe asymmetric septal hypertrophy, no SAM or LVOT obstruction, EF 25-30%, mildly decreased RV systolic function, PASP 60 mmHg, severe LAE, severe MR with restricted posterior leaflet.  - LHC/RHC (12/21): 90% stenosis in small ostial D1 and small ostial OM2, no interventional target. Mean RA 3, PA 38/14, mean PCWP 14 with v waves to 22, CI 2.66, PVR 2.1 WU.  4. VT: 9/21, suspected to be due to ranolazine + Tikosyn combination, ranolazine stopped.  5. H/o nephrolithiasis.  6. Mitral regurgitation: Moderate to severe by my review of 9/21 echo,  restriction of posterior leaflet.  - TEE (  12/21): Restricted posterior MV leaflet with 2 MR jets, ERO 0.3 (suspect 3+ MR, Carpentier IIIb). 7. LBBB  FH: Mother with atrial fibrillation.  Brother with SCD at 34.  Son also with HCM gene+.   Social History   Socioeconomic History  . Marital status: Married    Spouse name: Not on file  . Number of children: Not on file  . Years of education: Not on file  . Highest education level: Not on  file  Occupational History  . Not on file  Tobacco Use  . Smoking status: Never Smoker  . Smokeless tobacco: Former Neurosurgeon    Types: Engineer, drilling  . Vaping Use: Never used  Substance and Sexual Activity  . Alcohol use: Yes    Alcohol/week: 3.0 standard drinks    Types: 3 Cans of beer per week  . Drug use: No  . Sexual activity: Yes  Other Topics Concern  . Not on file  Social History Narrative   Lives with spouse in [place]   employed   Social Determinants of Health   Financial Resource Strain: Not on file  Food Insecurity: Not on file  Transportation Needs: Not on file  Physical Activity: Not on file  Stress: Not on file  Social Connections: Not on file  Intimate Partner Violence: Not on file   ROS: All systems reviewed and negative except as per HPI.   Current Outpatient Medications  Medication Sig Dispense Refill  . acetaminophen (TYLENOL) 325 MG tablet Take 650 mg by mouth every 6 (six) hours as needed (for pain.).    Marland Kitchen bisoprolol (ZEBETA) 5 MG tablet TAKE 1 TABLET (5 MG TOTAL) BY MOUTH IN THE MORNING AND AT BEDTIME. 180 tablet 3  . digoxin (LANOXIN) 0.125 MG tablet Take 1 tablet (0.125 mg total) by mouth daily. 90 tablet 3  . dofetilide (TIKOSYN) 500 MCG capsule Take 1 capsule (500 mcg total) by mouth 2 (two) times daily. 60 capsule 12  . ELIQUIS 5 MG TABS tablet TAKE 1 TABLET BY MOUTH TWICE A DAY 180 tablet 6  . empagliflozin (JARDIANCE) 10 MG TABS tablet Take 1 tablet (10 mg total) by mouth daily before breakfast. 90 tablet 3  . loratadine (CLARITIN) 10 MG tablet Take 10 mg by mouth daily as needed for allergies.     . magnesium oxide (MAG-OX) 400 MG tablet Take 1 tablet (400 mg total) by mouth daily. 30 tablet 6  . rosuvastatin (CRESTOR) 10 MG tablet Take 1 tablet (10 mg total) by mouth daily. 30 tablet 11  . sacubitril-valsartan (ENTRESTO) 49-51 MG Take 1 tablet by mouth 2 (two) times daily. 60 tablet 11  . spironolactone (ALDACTONE) 25 MG tablet Take 1  tablet (25 mg total) by mouth daily. 90 tablet 3   No current facility-administered medications for this encounter.   BP 112/70   Pulse (!) 53   Wt 78.5 kg (173 lb)   SpO2 97%   BMI 27.10 kg/m  General: NAD Neck: No JVD, no thyromegaly or thyroid nodule.  Lungs: Clear to auscultation bilaterally with normal respiratory effort. CV: Nondisplaced PMI.  Heart regular S1/S2, no S3/S4, 2/6 HSM apex.  No peripheral edema.  No carotid bruit.  Normal pedal pulses.  Abdomen: Soft, nontender, no hepatosplenomegaly, no distention.  Skin: Intact without lesions or rashes.  Neurologic: Alert and oriented x 3.  Psych: Normal affect. Extremities: No clubbing or cyanosis.  HEENT: Normal.    Assessment/Plan: 1. Chronic systolic CHF: Echo in 9/21 showed  severe asymmetric septal hypertrophy, no SAM or LVOT obstruction, EF 25-30%, mildly decreased RV systolic function, PASP 60 mmHg, severe LAE, severe MR with restricted posterior leaflet.  EF was lower than in the past (normal in 4/18) and mitral regurgitation was worse.  LHC/RHC in 12/21 showed 90% stenosis small D1 and 90% stenosis small OM2 (CAD does not explain cardiomyopathy) and normal filling pressures/preserved cardiac output.  [employer] ICD.  HR seems to be generally rate-controlled in AF though he does have occasional RVR which may contribute to fall in EF. He has a chronic LBBB which may contribute (though not markedly wide at 148 msec today). Cardiomyopathy could be natural history of HCM with worsening of function over time.  On exam, he is not volume overloaded.  NYHA class II symptoms.  - Continue bisoprolol 5 mg bid.  - Continue Entresto 49/51 bid.  - Continue dapagliflozin 10 mg daily.   - Continue digoxin, check level today.  - Increase spironolactone to 25 mg daily.  BMET today and again in 10 days.   - I do not think he will need Lasix right now.  - He may benefit from device upgrade to CRT given LBBB.  Will also consider  epicardial lead placement in setting of Maze/MV repair (see below).  2. Atrial fibrillation: Paroxysmal => persistent.  He had DCCV 01/03/20 but is back in atrial fibrillation now despite Tikosyn use.  He is off ranolazine due to VT with combination of Tikosyn and ranolazine.  Rate is in the 50s today. Atrial fibrillation ablation likely has a lower chance of success here due to the size of the left atrium (severely enlarged).  He feels worse when he is in afib and HR is > 100. I do not think a repeat DCCV is going to be successful.  - Continue bisoprolol 5 mg bid for rate control.  - Continue apixaban 5 mg bid.  - For now, will continue Tikosyn. QTc ok on ECG today.  - He has had surgical evaluation for Maze, MV repair, and epicardial LV lead placement. Plan now is for stress echo (to see if MR worsens with exercise) and CPX (to get an objective measurement of functional capacity).  Depending on results, we will aim for CRT upgrade with the hope that this will improve MR (but will be left in AF and likely will need to stop Tikosyn) or surgery to include Maze to try to get him back in NSR.  3. Mitral regurgitation: TEE in 12/21 with 3+ MR, Carpentier IIIb.   - As above, he is being evaluated for minimally invasive MV repair + maze + epicardial LV lead. He has seen Dr. Cornelius Moras and will have stress echo and CPX.  If functional capacity is poor and there is worsening MR with exertion, surgery may be the best option.  Otherwise, he will get CRT upgrade which may help with MR. 4. Hypertrophic cardiomyopathy: No SAM or LVOT obstruction, has asymmetric septal hypertrophy.  Gene+, brother with SCD at 26.   - When he returns, he is going to bring [place] his copy of the genetic testing results just to make a record of it.  5. VT: In 9/21, suspect due to combination of ranolazone and Tikosyn with QT prolongation.  Now off ranolazine.  6. CAD: LHC (12/21) with 90% ostial small D1 and 90% ostial small OM2.  CAD does not  explain the extent of his cardiomyopathy.   - Continue Crestor, good lipids in 1/22.   -  No ASA given Eliquis use.   Followup in 3 months.   Marca Ancona 04/16/2020

## 2020-04-18 ENCOUNTER — Other Ambulatory Visit (HOSPITAL_COMMUNITY): Payer: Self-pay | Admitting: *Deleted

## 2020-04-18 ENCOUNTER — Other Ambulatory Visit: Payer: Self-pay

## 2020-04-18 ENCOUNTER — Ambulatory Visit (HOSPITAL_COMMUNITY): Payer: BC Managed Care – PPO

## 2020-04-18 ENCOUNTER — Ambulatory Visit (HOSPITAL_COMMUNITY)
Admission: RE | Admit: 2020-04-18 | Discharge: 2020-04-18 | Disposition: A | Discharge status: Home / Self Care | Payer: BC Managed Care – PPO | Source: Ambulatory Visit | Attending: Thoracic Surgery (Cardiothoracic Vascular Surgery) | Admitting: Thoracic Surgery (Cardiothoracic Vascular Surgery)

## 2020-04-18 DIAGNOSIS — I340 Nonrheumatic mitral (valve) insufficiency: Secondary | ICD-10-CM

## 2020-04-18 DIAGNOSIS — I509 Heart failure, unspecified: Secondary | ICD-10-CM | POA: Diagnosis not present

## 2020-04-18 NOTE — Progress Notes (Signed)
Echocardiogram Echocardiogram Stress Test has been performed.  [name] 04/18/2020, 10:25 AM

## 2020-04-22 ENCOUNTER — Ambulatory Visit: Payer: BC Managed Care – PPO | Admitting: Thoracic Surgery (Cardiothoracic Vascular Surgery)

## 2020-04-25 ENCOUNTER — Encounter: Payer: Self-pay | Admitting: Thoracic Surgery (Cardiothoracic Vascular Surgery)

## 2020-04-25 ENCOUNTER — Other Ambulatory Visit: Payer: Self-pay

## 2020-04-25 ENCOUNTER — Ambulatory Visit (INDEPENDENT_AMBULATORY_CARE_PROVIDER_SITE_OTHER): Payer: BC Managed Care – PPO | Admitting: Thoracic Surgery (Cardiothoracic Vascular Surgery)

## 2020-04-25 VITALS — BP 117/69 | HR 51 | Resp 20 | Ht 67.0 in

## 2020-04-25 DIAGNOSIS — I4819 Other persistent atrial fibrillation: Secondary | ICD-10-CM | POA: Diagnosis not present

## 2020-04-25 DIAGNOSIS — I340 Nonrheumatic mitral (valve) insufficiency: Secondary | ICD-10-CM

## 2020-04-25 NOTE — Progress Notes (Signed)
[address]       [place] [address]             [phone]     CARDIOTHORACIC SURGERY OFFICE NOTE  Referring Provider is Laurey Morale, MD Primary Cardiologist is  Duke Salvia, MD PCP is Medicine, [facility]   HPI:  Patient is a 60 year old male with history of hypertrophic obstructive cardiomyopathy, ventricular tachycardia status post ICD placement, persistent atrial fibrillation, chronic combined systolic and diastolic congestive heart failure, and mitral regurgitation who returns to the office today for follow-up consultation regarding the presence of secondary mitral regurgitation.  He was originally seen in consultation on March 25, 2020 at which time the patient reportedly remained quite stable on medical therapy with no significant symptoms of exertional shortness of breath, PND, orthopnea, or lower extremity edema.  He works full-time in does report chronic fatigue, but he is able to manage staying on his feet most of the day at work without significant problems.  Review of the patient's recent transthoracic and transesophageal echocardiograms revealed severe asymmetric septal hypertrophy but no sign of significant dynamic left ventricular outflow tract obstruction nor any systolic anterior motion of the mitral valve.  The patient was noted to have moderate mitral regurgitation with functional characteristics consistent with secondary mitral regurgitation caused by severe left atrial enlargement with annular dilatation and functional restriction of the posterior leaflet (Carpentier type I and type IIIb).  Since then the patient underwent formal cardiopulmonary exercise testing and stress echocardiography.  He returns to our office today for follow-up to discuss the results of these tests and explore treatment options further.  He reports no new problems or complaints over the last few weeks.   Current Outpatient Medications   Medication Sig Dispense Refill  . acetaminophen (TYLENOL) 325 MG tablet Take 650 mg by mouth every 6 (six) hours as needed (for pain.).    Marland Kitchen bisoprolol (ZEBETA) 5 MG tablet TAKE 1 TABLET (5 MG TOTAL) BY MOUTH IN THE MORNING AND AT BEDTIME. 180 tablet 3  . digoxin (LANOXIN) 0.125 MG tablet Take 1 tablet (0.125 mg total) by mouth daily. 90 tablet 3  . dofetilide (TIKOSYN) 500 MCG capsule Take 1 capsule (500 mcg total) by mouth 2 (two) times daily. 60 capsule 12  . ELIQUIS 5 MG TABS tablet TAKE 1 TABLET BY MOUTH TWICE A DAY 180 tablet 6  . empagliflozin (JARDIANCE) 10 MG TABS tablet Take 1 tablet (10 mg total) by mouth daily before breakfast. 90 tablet 3  . loratadine (CLARITIN) 10 MG tablet Take 10 mg by mouth daily as needed for allergies.     . magnesium oxide (MAG-OX) 400 MG tablet Take 1 tablet (400 mg total) by mouth daily. 30 tablet 6  . rosuvastatin (CRESTOR) 10 MG tablet Take 1 tablet (10 mg total) by mouth daily. 30 tablet 11  . sacubitril-valsartan (ENTRESTO) 49-51 MG Take 1 tablet by mouth 2 (two) times daily. 60 tablet 11  . spironolactone (ALDACTONE) 25 MG tablet Take 1 tablet (25 mg total) by mouth daily. 90 tablet 3   No current facility-administered medications for this visit.      Physical Exam:   BP 117/69 (BP Location: Left Arm, Patient Position: Sitting)   Pulse (!) 51   Resp 20   Ht 5\' 7"  (1.702 m)   SpO2 97% Comment: RA  BMI 27.10 kg/m   General:  Well-appearing  Chest:   Clear to auscultation with  symmetrical breath sounds  CV:   Regular rate and rhythm without murmur  Incisions:  n/a  Abdomen:  Soft nontender  Extremities:  Warm and well-perfused  Diagnostic Tests:   EXERCISE STRESS ECHO REPORT      ---------------------------------------------------------------------------  -----   Patient Name:  Donald Bond Date of Exam: 04/18/2020  Medical Rec #: [id number]   Height:    67.0 in  Accession #:  [id number]  Weight:    173.0 lb   Date of Birth: [birth year]  BSA:     1.901 m  Patient Age:  59 years   BP:      108/56 mmHg  Patient Gender: M       HR:      54 bpm.  Exam Location: Outpatient   Procedure: 2D Echo, Color Doppler and Cardiac Doppler   Indications:  Mitral Regurgitation    History:    Patient has prior history of Echocardiogram examinations,  most         recent 03/11/2020.    Sonographer:  Irving Burton Senior RDCS  Referring Phys: [id number]egen W[address]le   IMPRESSIONS    1. Stress echo to assess mitral regurgitation at rest and with stress. At  rest, mitral regurgitation appears mild. With stress, mitral regurgitation  appears no more than moderate.  2. This is an inconclusive stress echocardiogram for ischemia. Global  hypokinesis.   FINDINGS   Exam Protocol: 12W CPX Protocol.     Patient Performance: The baseline heart rate was 54 bpm. The heart rate at  peak stress was 113 bpm. The target heart rate was calculated to be 137  bpm. The percentage of maximum predicted heart rate achieved was 70.3 %.  The baseline blood pressure was  108/56 mmHg. The blood pressure at peak stress was 108/62 mmHg. The  patient developed shortness of breath during the stress exam.    EKG: The patient developed atrial fibrillation during exercise.     2D Echo Findings: Global hypokinesis. This is an inconclusive stress  echocardiogram for ischemia.     Marca Ancona MD  Electronically signed on 04/18/2020 at 4:39:03 PM       CARDIOPULMONARY EXERCISE TESTING  Referred for: Heart Failure/Mitral regurgitation   Procedure: This patient underwent staged symptom-limited exercise testing using an individualized bike protocol with expired gas analysis metabolic evaluation during exercise.   Demographics   Age: 81 Ht. (in.) 67 Wt. (lb) 170 BMI: 26.6   Predicted Peak VO2: 28.8   Gender: Male Ht (cm) 170.2 Wt. (kg) 77.1    Results   Pre-Exercise PFTs    FVC 3.81 (90%)     FEV1 3.20 (97%)      FEV1/FVC 84 (107%)      MVV 105 (80%)    Exercise Time:  10:15  Watts: 120   RPE: 18   Reason stopped: leg fatigue and dyspnea (7/10)   Additional symptoms: lightheaded (4/10)   Resting HR: 62 Peak HR: 122  (76% age predicted max HR)   BP rest: 102/58 BP peak: 108/62   Peak VO2: 18.5 (64% predicted peak VO2)   VE/VCO2 slope: 47   OUES: 1.23   Peak RER: 1.28   Ventilatory Threshold: 13.3 (46% predicted or measured peak VO2)   Peak RR 44   Peak Ventilation: 95.7   VE/MVV: 91%   PETCO2 at peak: 21   O2pulse: 11  (81% predicted O2pulse)    Interpretation   Notes: Patient gave a very good effort. Pulse-oximetry  remained 99-100% for the duration of exercise. Exercise was performed on a cycle ergometer starting at [facility] and increasing by 12W/min. Echo was performed pre-exercise in seated position on cycle ergometer and Immediately at peak exercise seated on cycle ergometer.   ECG: ECG monitored but not recorded (due to software inability). Resting ECG atrial flutter consistently at rest and throughout exercise. HR response blunted despite maximal effort. There were no sustained arrhythmias or ST-T changes noted throughout exercise. BP response also blunted for the amount of work performed.   PFT: Pre-exercise spirometry was within normal limits. The MVV was normal.   CPX: Exercise testing with gas exchange demonstrates a moderately reduced peak VO2 of 18.5 ml/kg/min (64% of the age/gender/weight matched sedentary norms). The RER of 1.28 indicates a maximal effort. When adjusted to the patient's ideal body weight of 162.6 lb (73.7 kg) the peak VO2 is 19.3 ml/kg (ibw)/min (67% of the ibw-adjusted predicted). The VE/VCO2 slope is severely elevated and indicates excessive dead space ventilation. The oxygen uptake efficiency slope (OUES) is normal. The VO2 at the ventilatory threshold was normal at 46% of the  predicted peak VO2. At peak exercise, the ventilation reached 91% of the measured MVV indicating ventilatory reserve was nearly depleted. The O2pulse (a surrogate for stroke volume) Increased with incremental exercise, reaching peak at 90ml/beat (81% predicted).    Conclusion: Exercise testing with gas exchange demonstrates moderate functional impairment when compared to matched sedentary norms. There is more likely a moderate to severe HF limitation with severely elevated VE/VCO2 and blunted BP response, both individual high risk factors. There was significant chronotropic incompetence with maximal effort.    Test, report and preliminary impression by:  Lesia Hausen, MS, ACSM-RCEP  04/18/2020 12:06 PM   Attending: Severe circulatory limitation with very high Ve/VCO2 slope and minimal BP response to exercise. Given equivocal results of stress echo, would consider repeat exercise testing with invasive hemodynamics, That said, given results of this test, I suspect there is a severe elevation in left atrial and pulmonary pressures during exercise.   Arvilla Meres, MD  11:27 PM     Impression:  I have personally reviewed images from the patient's stress echocardiography as well as the reported results of the patient's recent cardiopulmonary exercise testing.  Stress echocardiography revealed only mild to moderate mitral regurgitation at most, with images at rest consistent with mild mitral regurgitation.  Cardiopulmonary exercise testing confirmed the presence of severe circulatory limitation related to the patient's underlying chronic nonischemic cardiomyopathy.  Under the circumstances I do not feel that there is sufficient evidence to suggest that the patient should undergo surgical intervention at this time for treatment of medically refractory secondary mitral regurgitation with or without concomitant Maze procedure.  I would favor consideration of upgrade of the patient's existing  defibrillator to initiate biventricular pacing.  If the patient develops evidence of severe secondary mitral regurgitation refractory to guideline based medical therapy and CRT I would be happy to reconsider surgical intervention.  Plan:  I have discussed the results of the patient's recent stress echocardiogram and cardiopulmonary exercise test at length with the patient and his wife in the office today.  I have discussed matters further with Dr. Shirlee Latch who plans to refer the patient back to Dr. Graciela Husbands for consideration of possible upgrade of his current defibrillator to initiate CRT.  All of his questions have been addressed.  The patient will call return to see [place] in the future as needed.    I spent in excess  of 15 minutes during the conduct of this office consultation and >50% of this time involved direct face-to-face encounter with the patient for counseling and/or coordination of their care.    Salvatore Decent. Cornelius Moras, MD 04/25/2020 10:50 AM

## 2020-04-25 NOTE — Progress Notes (Signed)
Donald Bond, Could you get Donald Bond in to discuss CRT upgrade? Thanks.  [name]

## 2020-04-25 NOTE — Patient Instructions (Signed)
Continue all previous medications without any changes at this time  Check your weight on a regular basis and keep a log for your records.  Look for signs of fluid overload such as worsening swelling of your lower legs, increased shortness of breath with activity, and/or a dry nonproductive cough.  Discussed these findings with your cardiologist including whether or not you should adjust your fluid pill dosage (diuretic).   

## 2020-04-26 ENCOUNTER — Other Ambulatory Visit: Payer: Self-pay

## 2020-04-26 ENCOUNTER — Other Ambulatory Visit: Payer: Self-pay | Admitting: Student

## 2020-04-26 ENCOUNTER — Ambulatory Visit (HOSPITAL_COMMUNITY)
Admission: RE | Admit: 2020-04-26 | Discharge: 2020-04-26 | Disposition: A | Discharge status: Home / Self Care | Payer: BC Managed Care – PPO | Source: Ambulatory Visit | Attending: Cardiology | Admitting: Cardiology

## 2020-04-26 DIAGNOSIS — I5042 Chronic combined systolic (congestive) and diastolic (congestive) heart failure: Secondary | ICD-10-CM | POA: Diagnosis not present

## 2020-04-26 LAB — BASIC METABOLIC PANEL
Anion gap: 8 (ref 5–15)
BUN: 14 mg/dL (ref 6–20)
CO2: 24 mmol/L (ref 22–32)
Calcium: 9.4 mg/dL (ref 8.9–10.3)
Chloride: 105 mmol/L (ref 98–111)
Creatinine, Ser: 0.98 mg/dL (ref 0.61–1.24)
GFR, Estimated: >60 mL/min (ref >60)
Glucose, Bld: 94 mg/dL (ref 70–99)
Potassium: 4.6 mmol/L (ref 3.5–5.1)
Sodium: 137 mmol/L (ref 135–145)

## 2020-05-01 ENCOUNTER — Encounter: Payer: Self-pay | Admitting: Internal Medicine

## 2020-05-01 ENCOUNTER — Ambulatory Visit: Payer: BC Managed Care – PPO | Admitting: Internal Medicine

## 2020-05-01 ENCOUNTER — Other Ambulatory Visit: Payer: Self-pay

## 2020-05-01 VITALS — BP 98/62 | HR 56 | Ht 67.0 in | Wt 170.6 lb

## 2020-05-01 DIAGNOSIS — I4819 Other persistent atrial fibrillation: Secondary | ICD-10-CM | POA: Diagnosis not present

## 2020-05-01 DIAGNOSIS — I422 Other hypertrophic cardiomyopathy: Secondary | ICD-10-CM | POA: Diagnosis not present

## 2020-05-01 DIAGNOSIS — Z01812 Encounter for preprocedural laboratory examination: Secondary | ICD-10-CM

## 2020-05-01 DIAGNOSIS — Z95810 Presence of automatic (implantable) cardiac defibrillator: Secondary | ICD-10-CM

## 2020-05-01 DIAGNOSIS — I5042 Chronic combined systolic (congestive) and diastolic (congestive) heart failure: Secondary | ICD-10-CM | POA: Diagnosis not present

## 2020-05-01 DIAGNOSIS — I472 Ventricular tachycardia, unspecified: Secondary | ICD-10-CM

## 2020-05-01 LAB — BASIC METABOLIC PANEL
BUN/Creatinine Ratio: 13 (ref 9–20)
BUN: 13 mg/dL (ref 6–24)
CO2: 21 mmol/L (ref 20–29)
Calcium: 9.3 mg/dL (ref 8.7–10.2)
Chloride: 102 mmol/L (ref 96–106)
Creatinine, Ser: 0.97 mg/dL (ref 0.76–1.27)
Glucose: 81 mg/dL (ref 65–99)
Potassium: 4.3 mmol/L (ref 3.5–5.2)
Sodium: 137 mmol/L (ref 134–144)
eGFR: 90 mL/min/{1.73_m2} (ref >59)

## 2020-05-01 LAB — CBC
Hematocrit: 49.3 % (ref 37.5–51.0)
Hemoglobin: 16.4 g/dL (ref 13.0–17.7)
MCH: 30.8 pg (ref 26.6–33.0)
MCHC: 33.3 g/dL (ref 31.5–35.7)
MCV: 93 fL (ref 79–97)
Platelets: 173 10*3/uL (ref 150–450)
RBC: 5.33 x10E6/uL (ref 4.14–5.80)
RDW: 12.9 % (ref 11.6–15.4)
WBC: 7.3 10*3/uL (ref 3.4–10.8)

## 2020-05-01 NOTE — Progress Notes (Signed)
Patient Care Team: Medicine, [facility] as PCP - General (Family Medicine) Duke Salvia, MD as PCP - Electrophysiology (Cardiology)   HPI  Donald Bond is a 60 y.o. male Seen in followup for HCM associated with syncope and previously treated ventricular tachycardia;  He has had 3 previous devices.  Underwent gen change #4  2018 w AEGIS pouch support.   He has persistent afib; on apixoban . He underwent cardioversion at the time of his device change   He has had problems with Afib and RVR with reaching into (VF zone > 240 bpm)  Atenolol was increased   Unfortunately he has continued to have episodes detected by his device > 210/240  Dofetilide initiated 4/19; saw JA 6/19 for consideration of catheter ablation>> declined  Recurrent atrial fibrillation.  He got himself an AliveCor monitor and persistent atrial fibrillation documented 5/21 and he underwent cardioversion delayed following the death of his parents   An attempt to augment the benefits of dofetilide, we tried closing.  He ended up with proarrhythmia  Recent interval evaluation by Dr. DM and CO regarding his atrial fibrillation, MR and cardiomyopathy.  The final recommendation has been that we proceed with CRT upgrade  Some dyspnea.  No edema.  No chest pain            DATE TEST EF     4/18 Echo Normal  LAE (49/2.4/70)   9/21 Echo   25-30%  MR severe TR mod-sev (RVE)                  Date Cr  K Mg Hgb  9/18 0.95 4.3   15.5  4/19  0.89 4.3 2.1 16.6  9/19  0.99 4.2 2.2    2/20 0.91 4.5   45.3 (Hct)   10/20 0.93 4.5 2.2    7/21 1.19 5.4 2.3 16.2   11/21 0.9 5.1 2.3 15.3    Thromboembolic risk factors ( HCM, CHF-1 ) for a CHADSVASc Score of >=2    Family >> Gene Screened Son Donald Bond + ( in school) Donald Bond - ( struggling not in school)   Past Medical History:  Diagnosis Date   AICD (automatic cardioverter/defibrillator) present X 4   CHF (congestive heart failure) (HCC)     Chronic combined systolic and diastolic congestive heart failure (HCC)    Headache    "weekly" (06/15/2017)   History of kidney stones    Hypertr obst cardiomyop    ICD--[facility]    Dr. Graciela Husbands   Mitral regurgitation    Orthostatic lightheadedness    Persistent atrial fibrillation (HCC)    Severe mitral valve regurgitation by Echo 11/24/19 11/26/2019   SYNCOPE    TIREDNESS    Tricuspid regurgitation    Ventricular fibrillation/polymorphic ventricular tachycardia 06/04/2011   Ventricular tachycardia (HCC)    rx via ICD    Past Surgical History:  Procedure Laterality Date   CARDIAC CATHETERIZATION     CARDIAC DEFIBRILLATOR PLACEMENT  ~ 2000; ~ 2010   "I've had 4" (06/15/2017)   CARDIOVERSION  06/29/2016   Procedure: Cardioversion;  Surgeon: Duke Salvia, MD;  Location: [facility] INVASIVE CV LAB;  Service: Cardiovascular;;   CARDIOVERSION N/A 06/17/2017   Procedure: CARDIOVERSION;  Surgeon: Jake Bathe, MD;  Location: [facility] ENDOSCOPY;  Service: Cardiovascular;  Laterality: N/A;   CARDIOVERSION N/A 07/11/2019   Procedure: CARDIOVERSION;  Surgeon: Chrystie Nose, MD;  Location: [facility] ENDOSCOPY;  Service: Cardiovascular;  Laterality: N/A;   CARDIOVERSION N/A 01/03/2020   Procedure: CARDIOVERSION;  Surgeon: Jake Bathe, MD;  Location: [facility] ENDOSCOPY;  Service: Cardiovascular;  Laterality: N/A;   Guidant Vitality ICD Impantation--Guidant Vitality T135--11/13/2003     HYDROCELE EXCISION / REPAIR Left 04/2013   ICD GENERATOR CHANGEOUT N/A 06/29/2016   Procedure: ICD Generator Changeout;  Surgeon: Duke Salvia, MD;  Location: [facility] INVASIVE CV LAB;  Service: Cardiovascular;  Laterality: N/A;   LAPAROSCOPIC CHOLECYSTECTOMY  2003   LITHOTRIPSY  X 1   RIGHT/LEFT HEART CATH AND CORONARY ANGIOGRAPHY N/A 02/14/2020   Procedure: RIGHT/LEFT HEART CATH AND CORONARY ANGIOGRAPHY;  Surgeon: Laurey Morale, MD;  Location: [facility] INVASIVE CV LAB;  Service: Cardiovascular;  Laterality: N/A;   TEE WITHOUT CARDIOVERSION  N/A 02/14/2020   Procedure: TRANSESOPHAGEAL ECHOCARDIOGRAM (TEE);  Surgeon: Laurey Morale, MD;  Location: [facility] ENDOSCOPY;  Service: Cardiovascular;  Laterality: N/A;    Current Outpatient Medications  Medication Sig Dispense Refill   acetaminophen (TYLENOL) 325 MG tablet Take 650 mg by mouth every 6 (six) hours as needed (for pain.).     bisoprolol (ZEBETA) 5 MG tablet TAKE 1 TABLET (5 MG TOTAL) BY MOUTH IN THE MORNING AND AT BEDTIME. 180 tablet 3   digoxin (LANOXIN) 0.125 MG tablet Take 1 tablet (0.125 mg total) by mouth daily. 90 tablet 3   dofetilide (TIKOSYN) 500 MCG capsule Take 1 capsule (500 mcg total) by mouth 2 (two) times daily. 60 capsule 12   ELIQUIS 5 MG TABS tablet TAKE 1 TABLET BY MOUTH TWICE A DAY 180 tablet 6   empagliflozin (JARDIANCE) 10 MG TABS tablet Take 1 tablet (10 mg total) by mouth daily before breakfast. 90 tablet 3   loratadine (CLARITIN) 10 MG tablet Take 10 mg by mouth daily as needed for allergies.      magnesium oxide (MAG-OX) 400 MG tablet Take 1 tablet (400 mg total) by mouth daily. 30 tablet 6   rosuvastatin (CRESTOR) 10 MG tablet Take 1 tablet (10 mg total) by mouth daily. 30 tablet 11   sacubitril-valsartan (ENTRESTO) 49-51 MG Take 1 tablet by mouth 2 (two) times daily. 60 tablet 11   spironolactone (ALDACTONE) 25 MG tablet Take 1 tablet (25 mg total) by mouth daily. 90 tablet 3   No current facility-administered medications for this visit.    No Known Allergies  Review of Systems negative except from HPI and PMH  Physical Exam BP 98/62   Pulse (!) 56   Ht 5\' 7"  (1.702 m)   Wt 170 lb 9.6 oz (77.4 kg)   SpO2 96%   BMI 26.72 kg/m  Well developed and nourished in no acute distress HENT normal Neck supple with JVP-  flat   Clear Regular rate and rhythm, no murmurs or gallops Abd-soft with active BS No Clubbing cyanosis edema Skin-warm and dry A & Oriented  Grossly normal sensory and motor function  ECG atrial fibrillation/flutter-course  with ventricular rate at 56 with left bundle branch block Assessment and plan  Hypertrophic cardio myopathy with severe LV dysfunction EF 25%  MR-severe  Ventricular tachycardia with appropriate therapy   LBBB//1AVB   Atrial Fibrillation-persistent long-term-recurrent  Inappropriate therapy  PVCs  Sleep disordered breathing and daytime somnolence  Status post ICD implantation-[employer]  Grief  No interval ventricular tachycardia following the discontinuation of ranolazine.  Atrial fibrillation remains persistent.  Rates are reasonably controlled.  Multiple discussions regarding the strategies going forward and the including recommendation has been to proceed with CRT  upgrade.  We have discussed the potential issues related to inability to deploy a lead.  There have been concerns expressed in the literature for left bundle branch block area pacing in patients with HCM given the thickness of the septum and the associated fibrosis.  His bundle pacing would also be a concern for the same reason.  We need to think about surgical lead deployment at that point.  Continue current medications.  Have discussed the risks and benefits of the procedure.  Noting that this will be his fourth procedure.  We would anticipate the use of an antimicrobial pouch.   

## 2020-05-01 NOTE — H&P (View-Only) (Signed)
Patient Care Team: Medicine, [facility] as PCP - General (Family Medicine) Duke Salvia, MD as PCP - Electrophysiology (Cardiology)   HPI  Donald Bond is a 60 y.o. male Seen in followup for HCM associated with syncope and previously treated ventricular tachycardia;  He has had 3 previous devices.  Underwent gen change #4  2018 w AEGIS pouch support.   He has persistent afib; on apixoban . He underwent cardioversion at the time of his device change   He has had problems with Afib and RVR with reaching into (VF zone > 240 bpm)  Atenolol was increased   Unfortunately he has continued to have episodes detected by his device > 210/240  Dofetilide initiated 4/19; saw JA 6/19 for consideration of catheter ablation>> declined  Recurrent atrial fibrillation.  He got himself an Alive[employer] monitor and persistent atrial fibrillation documented 5/21 and he underwent cardioversion delayed following the death of his parents   An attempt to augment the benefits of dofetilide, we tried closing.  He ended up with proarrhythmia  Recent interval evaluation by Dr. DM and CO regarding his atrial fibrillation, MR and cardiomyopathy.  The final recommendation has been that we proceed with CRT upgrade  Some dyspnea.  No edema.  No chest pain            DATE TEST EF     4/18 Echo Normal  LAE (49/2.4/70)   9/21 Echo   25-30%  MR severe TR mod-sev (RVE)                  Date Cr  K Mg Hgb  9/18 0.95 4.3   15.5  4/19  0.89 4.3 2.1 16.6  9/19  0.99 4.2 2.2    2/20 0.91 4.5   45.3 (Hct)   10/20 0.93 4.5 2.2    7/21 1.19 5.4 2.3 16.2   11/21 0.9 5.1 2.3 15.3    Thromboembolic risk factors ( HCM, CHF-1 ) for a CHADSVASc Score of >=2    Family >> Gene Screened Son [name] + ( in school) [name] - ( struggling not in school)   Past Medical History:  Diagnosis Date   AICD (automatic cardioverter/defibrillator) present X 4   CHF (congestive heart failure) (HCC)     Chronic combined systolic and diastolic congestive heart failure (HCC)    Headache    "weekly" (06/15/2017)   History of kidney stones    Hypertr obst cardiomyop    ICD--[facility]    Dr. Graciela Husbands   Mitral regurgitation    Orthostatic lightheadedness    Persistent atrial fibrillation (HCC)    Severe mitral valve regurgitation by Echo 11/24/19 11/26/2019   SYNCOPE    TIREDNESS    Tricuspid regurgitation    Ventricular fibrillation/polymorphic ventricular tachycardia 06/04/2011   Ventricular tachycardia (HCC)    rx via ICD    Past Surgical History:  Procedure Laterality Date   CARDIAC CATHETERIZATION     CARDIAC DEFIBRILLATOR PLACEMENT  ~ 2000; ~ 2010   "I've had 4" (06/15/2017)   CARDIOVERSION  06/29/2016   Procedure: Cardioversion;  Surgeon: Duke Salvia, MD;  Location: [facility] INVASIVE CV LAB;  Service: Cardiovascular;;   CARDIOVERSION N/A 06/17/2017   Procedure: CARDIOVERSION;  Surgeon: Jake Bathe, MD;  Location: [facility] ENDOSCOPY;  Service: Cardiovascular;  Laterality: N/A;   CARDIOVERSION N/A 07/11/2019   Procedure: CARDIOVERSION;  Surgeon: Chrystie Nose, MD;  Location: [facility] ENDOSCOPY;  Service: Cardiovascular;  Laterality: N/A;   CARDIOVERSION N/A 01/03/2020   Procedure: CARDIOVERSION;  Surgeon: Jake Bathe, MD;  Location: [facility] ENDOSCOPY;  Service: Cardiovascular;  Laterality: N/A;   Guidant Vitality ICD Impantation--Guidant Vitality T135--11/13/2003     HYDROCELE EXCISION / REPAIR Left 04/2013   ICD GENERATOR CHANGEOUT N/A 06/29/2016   Procedure: ICD Generator Changeout;  Surgeon: Duke Salvia, MD;  Location: [facility] INVASIVE CV LAB;  Service: Cardiovascular;  Laterality: N/A;   LAPAROSCOPIC CHOLECYSTECTOMY  2003   LITHOTRIPSY  X 1   RIGHT/LEFT HEART CATH AND CORONARY ANGIOGRAPHY N/A 02/14/2020   Procedure: RIGHT/LEFT HEART CATH AND CORONARY ANGIOGRAPHY;  Surgeon: Laurey Morale, MD;  Location: [facility] INVASIVE CV LAB;  Service: Cardiovascular;  Laterality: N/A;   TEE WITHOUT CARDIOVERSION  N/A 02/14/2020   Procedure: TRANSESOPHAGEAL ECHOCARDIOGRAM (TEE);  Surgeon: Laurey Morale, MD;  Location: [facility] ENDOSCOPY;  Service: Cardiovascular;  Laterality: N/A;    Current Outpatient Medications  Medication Sig Dispense Refill   acetaminophen (TYLENOL) 325 MG tablet Take 650 mg by mouth every 6 (six) hours as needed (for pain.).     bisoprolol (ZEBETA) 5 MG tablet TAKE 1 TABLET (5 MG TOTAL) BY MOUTH IN THE MORNING AND AT BEDTIME. 180 tablet 3   digoxin (LANOXIN) 0.125 MG tablet Take 1 tablet (0.125 mg total) by mouth daily. 90 tablet 3   dofetilide (TIKOSYN) 500 MCG capsule Take 1 capsule (500 mcg total) by mouth 2 (two) times daily. 60 capsule 12   ELIQUIS 5 MG TABS tablet TAKE 1 TABLET BY MOUTH TWICE A DAY 180 tablet 6   empagliflozin (JARDIANCE) 10 MG TABS tablet Take 1 tablet (10 mg total) by mouth daily before breakfast. 90 tablet 3   loratadine (CLARITIN) 10 MG tablet Take 10 mg by mouth daily as needed for allergies.      magnesium oxide (MAG-OX) 400 MG tablet Take 1 tablet (400 mg total) by mouth daily. 30 tablet 6   rosuvastatin (CRESTOR) 10 MG tablet Take 1 tablet (10 mg total) by mouth daily. 30 tablet 11   sacubitril-valsartan (ENTRESTO) 49-51 MG Take 1 tablet by mouth 2 (two) times daily. 60 tablet 11   spironolactone (ALDACTONE) 25 MG tablet Take 1 tablet (25 mg total) by mouth daily. 90 tablet 3   No current facility-administered medications for this visit.    No Known Allergies  Review of Systems negative except from HPI and PMH  Physical Exam BP 98/62   Pulse (!) 56   Ht 5\' 7"  (1.702 m)   Wt 170 lb 9.6 oz (77.4 kg)   SpO2 96%   BMI 26.72 kg/m  Well developed and nourished in no acute distress HENT normal Neck supple with JVP-  flat   Clear Regular rate and rhythm, no murmurs or gallops Abd-soft with active BS No Clubbing cyanosis edema Skin-warm and dry A & Oriented  Grossly normal sensory and motor function  ECG atrial fibrillation/flutter-course  with ventricular rate at 56 with left bundle branch block Assessment and plan  Hypertrophic cardio myopathy with severe LV dysfunction EF 25%  MR-severe  Ventricular tachycardia with appropriate therapy   LBBB//1AVB   Atrial Fibrillation-persistent long-term-recurrent  Inappropriate therapy  PVCs  Sleep disordered breathing and daytime somnolence  Status post ICD implantation-[employer]  Grief  No interval ventricular tachycardia following the discontinuation of ranolazine.  Atrial fibrillation remains persistent.  Rates are reasonably controlled.  Multiple discussions regarding the strategies going forward and the including recommendation has been to proceed with CRT  upgrade.  We have discussed the potential issues related to inability to deploy a lead.  There have been concerns expressed in the literature for left bundle branch block area pacing in patients with HCM given the thickness of the septum and the associated fibrosis.  His bundle pacing would also be a concern for the same reason.  We need to think about surgical lead deployment at that point.  Continue current medications.  Have discussed the risks and benefits of the procedure.  Noting that this will be his fourth procedure.  We would anticipate the use of an antimicrobial pouch.

## 2020-05-01 NOTE — Patient Instructions (Signed)
Medication Instructions:  Your physician recommends that you continue on your current medications as directed. Please refer to the Current Medication list given to you today.  *If you need a refill on your cardiac medications before your next appointment, please call your pharmacy*   Lab Work: CBC and BMET today  If you have labs (blood work) drawn today and your tests are completely normal, you will receive your results only by: Marland Kitchen MyChart Message (if you have MyChart) OR . A paper copy in the mail If you have any lab test that is abnormal or we need to change your treatment, we will call you to review the results.   Testing/Procedures: You are schedule for An ICD BI-V upgrade on 05/31/2020   Follow-Up: At [facility], you and your health needs are our priority.  As part of our continuing mission to provide you with exceptional heart care, we have created designated Provider Care Teams.  These Care Teams include your primary Cardiologist (physician) and Advanced Practice Providers (APPs -  Physician Assistants and Nurse Practitioners) who all work together to provide you with the care you need, when you need it.  We recommend signing up for the patient portal called "MyChart".  Sign up information is provided on this After Visit Summary.  MyChart is used to connect with patients for Virtual Visits (Telemedicine).  Patients are able to view lab/test results, encounter notes, upcoming appointments, etc.  Non-urgent messages can be sent to your provider as well.   To learn more about what you can do with MyChart, go to [email or web address].    Your next appointment:   To be scheduled

## 2020-05-03 ENCOUNTER — Ambulatory Visit (INDEPENDENT_AMBULATORY_CARE_PROVIDER_SITE_OTHER): Payer: BC Managed Care – PPO

## 2020-05-03 DIAGNOSIS — I472 Ventricular tachycardia, unspecified: Secondary | ICD-10-CM

## 2020-05-03 DIAGNOSIS — I422 Other hypertrophic cardiomyopathy: Secondary | ICD-10-CM

## 2020-05-06 LAB — CUP PACEART REMOTE DEVICE CHECK
Battery Remaining Longevity: 138 mo
Battery Remaining Percentage: 100 %
Brady Statistic RV Percent Paced: 3 %
Date Time Interrogation Session: 20220304040200
HighPow Impedance: 48 Ohm
Implantable Lead Implant Date: 20000815
Implantable Lead Location: 753860
Implantable Lead Model: 144
Implantable Lead Serial Number: 334759
Implantable Pulse Generator Implant Date: 20180430
Lead Channel Impedance Value: 848 Ohm
Lead Channel Pacing Threshold Amplitude: 0.9 V
Lead Channel Pacing Threshold Pulse Width: 0.4 ms
Lead Channel Setting Pacing Amplitude: 2.5 V
Lead Channel Setting Pacing Pulse Width: 0.4 ms
Lead Channel Setting Sensing Sensitivity: 0.5 mV
Pulse Gen Serial Number: 198978

## 2020-05-07 NOTE — Telephone Encounter (Signed)
Spoke with pt and advised d/t schedule change pt will now need to arrive at [facility] Short Stay on 05/31/2020 at 830am for a 1030 procedure start time.  Pt verbalizes understanding and agrees with current plan.

## 2020-05-14 NOTE — Progress Notes (Signed)
Remote ICD transmission.   

## 2020-05-29 ENCOUNTER — Other Ambulatory Visit (HOSPITAL_COMMUNITY)
Admission: RE | Admit: 2020-05-29 | Discharge: 2020-05-29 | Disposition: A | Discharge status: Home / Self Care | Payer: BC Managed Care – PPO | Source: Ambulatory Visit | Attending: Internal Medicine | Admitting: Internal Medicine

## 2020-05-29 DIAGNOSIS — Z20822 Contact with and (suspected) exposure to covid-19: Secondary | ICD-10-CM | POA: Insufficient documentation

## 2020-05-29 DIAGNOSIS — Z9049 Acquired absence of other specified parts of digestive tract: Secondary | ICD-10-CM | POA: Diagnosis not present

## 2020-05-29 DIAGNOSIS — I5042 Chronic combined systolic (congestive) and diastolic (congestive) heart failure: Secondary | ICD-10-CM | POA: Diagnosis not present

## 2020-05-29 DIAGNOSIS — G4730 Sleep apnea, unspecified: Secondary | ICD-10-CM | POA: Diagnosis not present

## 2020-05-29 DIAGNOSIS — Z7901 Long term (current) use of anticoagulants: Secondary | ICD-10-CM | POA: Diagnosis not present

## 2020-05-29 DIAGNOSIS — I447 Left bundle-branch block, unspecified: Secondary | ICD-10-CM | POA: Diagnosis not present

## 2020-05-29 DIAGNOSIS — Z01812 Encounter for preprocedural laboratory examination: Secondary | ICD-10-CM | POA: Insufficient documentation

## 2020-05-29 DIAGNOSIS — Z4502 Encounter for adjustment and management of automatic implantable cardiac defibrillator: Secondary | ICD-10-CM | POA: Diagnosis not present

## 2020-05-29 DIAGNOSIS — E119 Type 2 diabetes mellitus without complications: Secondary | ICD-10-CM | POA: Diagnosis not present

## 2020-05-29 DIAGNOSIS — I429 Cardiomyopathy, unspecified: Secondary | ICD-10-CM | POA: Diagnosis not present

## 2020-05-29 DIAGNOSIS — Z79899 Other long term (current) drug therapy: Secondary | ICD-10-CM | POA: Diagnosis not present

## 2020-05-29 DIAGNOSIS — I472 Ventricular tachycardia: Secondary | ICD-10-CM | POA: Diagnosis not present

## 2020-05-29 DIAGNOSIS — I4819 Other persistent atrial fibrillation: Secondary | ICD-10-CM | POA: Diagnosis not present

## 2020-05-29 LAB — SARS CORONAVIRUS 2 (TAT 6-24 HRS): SARS Coronavirus 2: NEGATIVE

## 2020-05-31 ENCOUNTER — Ambulatory Visit (HOSPITAL_COMMUNITY): Payer: BC Managed Care – PPO

## 2020-05-31 ENCOUNTER — Ambulatory Visit (HOSPITAL_COMMUNITY)
Admission: RE | Disposition: A | Discharge status: Home / Self Care | Payer: BC Managed Care – PPO | Source: Ambulatory Visit | Attending: Internal Medicine

## 2020-05-31 ENCOUNTER — Other Ambulatory Visit: Payer: Self-pay

## 2020-05-31 ENCOUNTER — Ambulatory Visit (HOSPITAL_COMMUNITY)
Admission: RE | Admit: 2020-05-31 | Discharge: 2020-05-31 | Disposition: A | Discharge status: Home / Self Care | Payer: BC Managed Care – PPO | Source: Ambulatory Visit | Attending: Internal Medicine | Admitting: Internal Medicine

## 2020-05-31 DIAGNOSIS — E119 Type 2 diabetes mellitus without complications: Secondary | ICD-10-CM | POA: Insufficient documentation

## 2020-05-31 DIAGNOSIS — Z79899 Other long term (current) drug therapy: Secondary | ICD-10-CM | POA: Diagnosis not present

## 2020-05-31 DIAGNOSIS — I501 Left ventricular failure: Secondary | ICD-10-CM | POA: Diagnosis not present

## 2020-05-31 DIAGNOSIS — I472 Ventricular tachycardia, unspecified: Secondary | ICD-10-CM

## 2020-05-31 DIAGNOSIS — Z4502 Encounter for adjustment and management of automatic implantable cardiac defibrillator: Secondary | ICD-10-CM | POA: Diagnosis not present

## 2020-05-31 DIAGNOSIS — I5042 Chronic combined systolic (congestive) and diastolic (congestive) heart failure: Secondary | ICD-10-CM | POA: Insufficient documentation

## 2020-05-31 DIAGNOSIS — Z95810 Presence of automatic (implantable) cardiac defibrillator: Secondary | ICD-10-CM

## 2020-05-31 DIAGNOSIS — I429 Cardiomyopathy, unspecified: Secondary | ICD-10-CM | POA: Insufficient documentation

## 2020-05-31 DIAGNOSIS — G4730 Sleep apnea, unspecified: Secondary | ICD-10-CM | POA: Diagnosis not present

## 2020-05-31 DIAGNOSIS — Z20822 Contact with and (suspected) exposure to covid-19: Secondary | ICD-10-CM | POA: Diagnosis not present

## 2020-05-31 DIAGNOSIS — Z9049 Acquired absence of other specified parts of digestive tract: Secondary | ICD-10-CM | POA: Insufficient documentation

## 2020-05-31 DIAGNOSIS — I447 Left bundle-branch block, unspecified: Secondary | ICD-10-CM | POA: Diagnosis not present

## 2020-05-31 DIAGNOSIS — Z7901 Long term (current) use of anticoagulants: Secondary | ICD-10-CM | POA: Insufficient documentation

## 2020-05-31 DIAGNOSIS — Z959 Presence of cardiac and vascular implant and graft, unspecified: Secondary | ICD-10-CM

## 2020-05-31 DIAGNOSIS — I4819 Other persistent atrial fibrillation: Secondary | ICD-10-CM | POA: Insufficient documentation

## 2020-05-31 DIAGNOSIS — I422 Other hypertrophic cardiomyopathy: Secondary | ICD-10-CM | POA: Diagnosis not present

## 2020-05-31 DIAGNOSIS — I517 Cardiomegaly: Secondary | ICD-10-CM | POA: Diagnosis not present

## 2020-05-31 HISTORY — PX: BIV ICD GENERATOR CHANGEOUT: EP1194

## 2020-05-31 LAB — POCT I-STAT, CHEM 8
BUN: 16 mg/dL (ref 6–20)
Calcium, Ion: 1.32 mmol/L (ref 1.15–1.40)
Chloride: 98 mmol/L (ref 98–111)
Creatinine, Ser: 0.9 mg/dL (ref 0.61–1.24)
Glucose, Bld: 89 mg/dL (ref 70–99)
HCT: 46 % (ref 39.0–52.0)
Hemoglobin: 15.6 g/dL (ref 13.0–17.0)
Potassium: 4.3 mmol/L (ref 3.5–5.1)
Sodium: 140 mmol/L (ref 135–145)
TCO2: 29 mmol/L (ref 22–32)

## 2020-05-31 SURGERY — BIV ICD GENERATOR CHANGEOUT

## 2020-05-31 MED ORDER — MIDAZOLAM HCL 5 MG/5ML IJ SOLN
INTRAMUSCULAR | Status: DC | PRN
  Administered 2020-05-31 (×3): 1 mg via INTRAVENOUS
  Administered 2020-05-31: 2 mg via INTRAVENOUS

## 2020-05-31 MED ORDER — SODIUM CHLORIDE 0.9 % IV SOLN: INTRAVENOUS | Status: DC

## 2020-05-31 MED ORDER — IOHEXOL 350 MG/ML SOLN
INTRAVENOUS | Status: DC | PRN
  Administered 2020-05-31: 15 mL
  Administered 2020-05-31: 10 mL via INTRA_ARTERIAL

## 2020-05-31 MED ORDER — FENTANYL CITRATE (PF) 100 MCG/2ML IJ SOLN
INTRAMUSCULAR | Status: DC | PRN
  Administered 2020-05-31: 25 ug via INTRAVENOUS
  Administered 2020-05-31: 50 ug via INTRAVENOUS
  Administered 2020-05-31 (×2): 25 ug via INTRAVENOUS

## 2020-05-31 MED ORDER — FENTANYL CITRATE (PF) 100 MCG/2ML IJ SOLN
INTRAMUSCULAR | Status: AC
  Filled 2020-05-31: qty 2

## 2020-05-31 MED ORDER — MIDAZOLAM HCL 5 MG/5ML IJ SOLN
INTRAMUSCULAR | Status: AC
  Filled 2020-05-31: qty 5

## 2020-05-31 MED ORDER — LIDOCAINE HCL 1 % IJ SOLN
INTRAMUSCULAR | Status: AC
  Filled 2020-05-31: qty 60

## 2020-05-31 MED ORDER — SODIUM CHLORIDE 0.9 % IV SOLN
INTRAVENOUS | Status: AC
  Filled 2020-05-31: qty 2

## 2020-05-31 MED ORDER — LIDOCAINE HCL (PF) 1 % IJ SOLN
INTRAMUSCULAR | Status: DC | PRN
  Administered 2020-05-31: 60 mL

## 2020-05-31 MED ORDER — SODIUM CHLORIDE 0.9 % IV SOLN
80.0000 mg | INTRAVENOUS | Status: AC
  Administered 2020-05-31: 80 mg

## 2020-05-31 MED ORDER — ONDANSETRON HCL 4 MG/2ML IJ SOLN: 4.0000 mg | Freq: Four times a day (QID) | INTRAMUSCULAR | Status: DC | PRN

## 2020-05-31 MED ORDER — CEFAZOLIN SODIUM-DEXTROSE 2-4 GM/100ML-% IV SOLN
2.0000 g | INTRAVENOUS | Status: AC
  Administered 2020-05-31: 2 g via INTRAVENOUS

## 2020-05-31 MED ORDER — CEFAZOLIN SODIUM-DEXTROSE 2-4 GM/100ML-% IV SOLN
INTRAVENOUS | Status: AC
  Filled 2020-05-31: qty 100

## 2020-05-31 MED ORDER — HEPARIN (PORCINE) IN NACL 1000-0.9 UT/500ML-% IV SOLN
INTRAVENOUS | Status: DC | PRN
  Administered 2020-05-31: 500 mL

## 2020-05-31 MED ORDER — HEPARIN (PORCINE) IN NACL 1000-0.9 UT/500ML-% IV SOLN
INTRAVENOUS | Status: AC
  Filled 2020-05-31: qty 500

## 2020-05-31 MED ORDER — ACETAMINOPHEN 325 MG PO TABS
325.0000 mg | ORAL_TABLET | ORAL | Status: DC | PRN
  Administered 2020-05-31: 650 mg via ORAL
  Filled 2020-05-31: qty 2

## 2020-05-31 MED ORDER — ACETAMINOPHEN 325 MG PO TABS
ORAL_TABLET | ORAL | Status: AC
  Filled 2020-05-31: qty 2

## 2020-05-31 SURGICAL SUPPLY — 21 items
ATTRACTOMAT 16X20 MAGNETIC DRP (DRAPES) ×1 IMPLANT
BALLN CATH 6FR 90 (CATHETERS) ×2
CABLE SURGICAL S-101-97-12 (CABLE) ×1 IMPLANT
CATH BALLOON 6FR 90 2LUMEN (CATHETERS) IMPLANT
CATH CPS DIRECT 135 DS2C020 (CATHETERS) ×1 IMPLANT
CPS IMPLANT KIT 410190 (MISCELLANEOUS) ×2 IMPLANT
DEVICE DISSECT PLASMABLAD 3.0S (MISCELLANEOUS) IMPLANT
HEMOSTAT SURGICEL 2X4 FIBR (HEMOSTASIS) ×2 IMPLANT
ICD MOMENTUM X4 CRT-D G138 (ICD Generator) ×2 IMPLANT
LEAD INGEVITY 7841 52 (Lead) ×1 IMPLANT
LEAD QUARTET 1458QL-86 (Lead) IMPLANT
PAD PRO RADIOLUCENT 2001M-C (PAD) ×1 IMPLANT
PLASMABLADE 3.0S (MISCELLANEOUS) ×2
POUCH AIGIS-R ANTIBACT ICD (Mesh General) ×2 IMPLANT
QUARTET 1458QL-86 (Lead) ×2 IMPLANT
SHEATH 7FR PRELUDE SNAP 13 (SHEATH) ×1 IMPLANT
SHEATH 9.5FR PRELUDE SNAP 13 (SHEATH) ×1 IMPLANT
SLITTER UNIVERSAL DS2A003 (MISCELLANEOUS) ×1 IMPLANT
TRAY PACEMAKER INSERTION (PACKS) ×2 IMPLANT
WIRE ACUITY WHISPER EDS 4648 (WIRE) ×3 IMPLANT
WIRE HI TORQ VERSACORE-J 145CM (WIRE) ×1 IMPLANT

## 2020-05-31 NOTE — Progress Notes (Signed)
Dr Klein notified of cxr results and ok to d/c home 

## 2020-05-31 NOTE — Interval H&P Note (Signed)
History and Physical Interval Note:  05/31/2020 11:40 AM  Donald Bond  has presented today for surgery, with the diagnosis of cardiomyopathy.  The various methods of treatment have been discussed with the patient and family. After consideration of risks, benefits and other options for treatment, the patient has consented to  Procedure(s): BIV ICD GENERATOR UPGRADE (N/A) as a surgical intervention.  The patient's history has been reviewed, patient examined, no change in status, stable for surgery.  I have reviewed the patient's chart and labs.  Questions were answered to the patient's satisfaction.     Sherryl Manges  Plan will be for BiV upgrade and insertion of an atrial lead We have reviewed the benefits and risks of generator replacement.  These include but are not limited to lead fracture and infection.  The patient understands, agrees and is willing to proceed.

## 2020-05-31 NOTE — Discharge Instructions (Signed)
     Supplemental Discharge Instructions for  Pacemaker/Defibrillator Patients  Tomorrow, 06/01/20, send in a device transmission  Activity No heavy lifting or vigorous activity with your left/right arm for 6 to 8 weeks.  Do not raise your left/right arm above your head for one week.  Gradually raise your affected arm as drawn below.             06/05/20                       06/06/20                     06/07/20                      06/08/20 __  NO DRIVING until cleared to at your wound check visit .  WOUND CARE - Keep the wound area clean and dry.  Do not get this area wet , no showers for 24 hours; you may shower on   06/02/20  . - Tomorrow,06/01/20, remove the arm sling - Dr. Graciela Husbands used DERMABOND to close your wound.  DO NOT peel this off.  Do not rub/scrub the area, pat dry. - No bandage is needed on the site.  DO  NOT apply any creams, oils, or ointments to the wound area. - If you notice any drainage or discharge from the wound, any swelling or bruising at the site, or you develop a fever > 101? F after you are discharged home, call the office at once.  Special Instructions - You are still able to use cellular telephones; use the ear opposite the side where you have your pacemaker/defibrillator.  Avoid carrying your cellular phone near your device. - When traveling through airports, show security personnel your identification card to avoid being screened in the metal detectors.  Ask the security personnel to use the hand wand. - Avoid arc welding equipment, MRI testing (magnetic resonance imaging), TENS units (transcutaneous nerve stimulators).  Call the office for questions about other devices. - Avoid electrical appliances that are in poor condition or are not properly grounded. - Microwave ovens are safe to be near or to operate.   Additional information for defibrillator patients should your device go off: - If your device goes off ONCE and you feel fine afterward, notify the device  clinic nurses. - If your device goes off ONCE and you do not feel well afterward, call 911. - If your device goes off TWICE, call 911. - If your device goes off THREE times in one day, call 911.  DO NOT DRIVE YOURSELF OR A FAMILY MEMBER WITH A DEFIBRILLATOR TO THE HOSPITAL--CALL 911.

## 2020-06-02 MED FILL — Lidocaine HCl Local Inj 1%: INTRAMUSCULAR | Qty: 60 | Status: AC

## 2020-06-03 ENCOUNTER — Encounter (HOSPITAL_COMMUNITY): Payer: Self-pay | Admitting: Internal Medicine

## 2020-06-06 ENCOUNTER — Encounter (HOSPITAL_COMMUNITY): Payer: Self-pay | Admitting: Internal Medicine

## 2020-06-11 ENCOUNTER — Ambulatory Visit (INDEPENDENT_AMBULATORY_CARE_PROVIDER_SITE_OTHER): Payer: BC Managed Care – PPO | Admitting: Emergency Medicine

## 2020-06-11 ENCOUNTER — Telehealth: Payer: Self-pay

## 2020-06-11 ENCOUNTER — Other Ambulatory Visit: Payer: Self-pay

## 2020-06-11 DIAGNOSIS — I4901 Ventricular fibrillation: Secondary | ICD-10-CM | POA: Diagnosis not present

## 2020-06-11 DIAGNOSIS — I5042 Chronic combined systolic (congestive) and diastolic (congestive) heart failure: Secondary | ICD-10-CM

## 2020-06-11 DIAGNOSIS — I422 Other hypertrophic cardiomyopathy: Secondary | ICD-10-CM

## 2020-06-11 DIAGNOSIS — I480 Paroxysmal atrial fibrillation: Secondary | ICD-10-CM

## 2020-06-11 DIAGNOSIS — Z01812 Encounter for preprocedural laboratory examination: Secondary | ICD-10-CM

## 2020-06-11 LAB — CUP PACEART INCLINIC DEVICE CHECK
Brady Statistic RV Percent Paced: 78 %
Date Time Interrogation Session: 20220412095307
HighPow Impedance: 51 Ohm
Implantable Lead Implant Date: 20000815
Implantable Lead Implant Date: 20220401
Implantable Lead Implant Date: 20220401
Implantable Lead Location: 753858
Implantable Lead Location: 753859
Implantable Lead Location: 753860
Implantable Lead Model: 144
Implantable Lead Model: 7841
Implantable Lead Serial Number: 1129476
Implantable Lead Serial Number: 334759
Implantable Pulse Generator Implant Date: 20220401
Lead Channel Impedance Value: 638 Ohm
Lead Channel Impedance Value: 660 Ohm
Lead Channel Impedance Value: 871 Ohm
Lead Channel Pacing Threshold Amplitude: 0.8 V
Lead Channel Pacing Threshold Amplitude: 3.2 V
Lead Channel Pacing Threshold Pulse Width: 0.4 ms
Lead Channel Pacing Threshold Pulse Width: 0.5 ms
Lead Channel Sensing Intrinsic Amplitude: 10.4 mV
Lead Channel Sensing Intrinsic Amplitude: 2.4 mV
Lead Channel Sensing Intrinsic Amplitude: 25 mV
Lead Channel Setting Pacing Amplitude: 2.2 V
Lead Channel Setting Pacing Amplitude: 5 V
Lead Channel Setting Pacing Pulse Width: 0.4 ms
Lead Channel Setting Pacing Pulse Width: 0.5 ms
Lead Channel Setting Sensing Sensitivity: 0.5 mV
Lead Channel Setting Sensing Sensitivity: 1 mV
Pulse Gen Serial Number: 389080

## 2020-06-11 NOTE — Progress Notes (Signed)
Wound check appointment. Dermabond removed. Wound without redness or edema. Incision edges approximated, wound well healed. Normal device function. Sensing, impedances and RV Thresholds consistent with implant measurements. LV threshold noted elevated but consistent compared to implant measurements.  Device programmed LV output at  5V for extra safety margin until 3 month visit. Histogram distribution appropriate for patient and level of activity. No mode switches.  1 ventricular arrhythmia noted, appears AF with RVR lasting 17 seconds, average V rate 182 bpm.  Patient educated about wound care, arm mobility, lifting restrictions, shock plan. Patient is enrolled in remote monitoring, next scheduled check 09/03/20.  ROV with Dr. Graciela Husbands 09/03/20.

## 2020-06-11 NOTE — Telephone Encounter (Signed)
Spoke with pt and reviewed DCCV letter and instructions with pt.  See letter for completed details.  Pt verbalizes understanding and agrees with current plan.

## 2020-06-16 ENCOUNTER — Other Ambulatory Visit: Payer: Self-pay | Admitting: Student

## 2020-06-26 ENCOUNTER — Other Ambulatory Visit (HOSPITAL_COMMUNITY)
Admission: RE | Admit: 2020-06-26 | Discharge: 2020-06-26 | Disposition: A | Discharge status: Home / Self Care | Payer: BC Managed Care – PPO | Source: Ambulatory Visit | Attending: Cardiology | Admitting: Cardiology

## 2020-06-26 ENCOUNTER — Other Ambulatory Visit: Payer: BC Managed Care – PPO | Admitting: *Deleted

## 2020-06-26 ENCOUNTER — Other Ambulatory Visit: Payer: Self-pay

## 2020-06-26 DIAGNOSIS — I5020 Unspecified systolic (congestive) heart failure: Secondary | ICD-10-CM | POA: Diagnosis not present

## 2020-06-26 DIAGNOSIS — Z01812 Encounter for preprocedural laboratory examination: Secondary | ICD-10-CM

## 2020-06-26 DIAGNOSIS — I480 Paroxysmal atrial fibrillation: Secondary | ICD-10-CM | POA: Diagnosis not present

## 2020-06-26 DIAGNOSIS — I43 Cardiomyopathy in diseases classified elsewhere: Secondary | ICD-10-CM | POA: Diagnosis not present

## 2020-06-26 DIAGNOSIS — I422 Other hypertrophic cardiomyopathy: Secondary | ICD-10-CM

## 2020-06-26 DIAGNOSIS — Z7901 Long term (current) use of anticoagulants: Secondary | ICD-10-CM | POA: Diagnosis not present

## 2020-06-26 DIAGNOSIS — Z20822 Contact with and (suspected) exposure to covid-19: Secondary | ICD-10-CM | POA: Insufficient documentation

## 2020-06-26 DIAGNOSIS — I5042 Chronic combined systolic (congestive) and diastolic (congestive) heart failure: Secondary | ICD-10-CM

## 2020-06-26 DIAGNOSIS — I110 Hypertensive heart disease with heart failure: Secondary | ICD-10-CM | POA: Diagnosis not present

## 2020-06-26 DIAGNOSIS — I4891 Unspecified atrial fibrillation: Secondary | ICD-10-CM | POA: Diagnosis not present

## 2020-06-26 DIAGNOSIS — Z95810 Presence of automatic (implantable) cardiac defibrillator: Secondary | ICD-10-CM | POA: Diagnosis not present

## 2020-06-26 LAB — SARS CORONAVIRUS 2 (TAT 6-24 HRS): SARS Coronavirus 2: NEGATIVE

## 2020-06-27 LAB — CBC
Hematocrit: 52.2 % — ABNORMAL HIGH (ref 37.5–51.0)
Hemoglobin: 17.1 g/dL (ref 13.0–17.7)
MCH: 30.6 pg (ref 26.6–33.0)
MCHC: 32.8 g/dL (ref 31.5–35.7)
MCV: 94 fL (ref 79–97)
Platelets: 181 10*3/uL (ref 150–450)
RBC: 5.58 x10E6/uL (ref 4.14–5.80)
RDW: 12.8 % (ref 11.6–15.4)
WBC: 6.1 10*3/uL (ref 3.4–10.8)

## 2020-06-27 LAB — BASIC METABOLIC PANEL
BUN/Creatinine Ratio: 15 (ref 9–20)
BUN: 17 mg/dL (ref 6–24)
CO2: 24 mmol/L (ref 20–29)
Calcium: 9.9 mg/dL (ref 8.7–10.2)
Chloride: 100 mmol/L (ref 96–106)
Creatinine, Ser: 1.1 mg/dL (ref 0.76–1.27)
Glucose: 76 mg/dL (ref 65–99)
Potassium: 4.7 mmol/L (ref 3.5–5.2)
Sodium: 139 mmol/L (ref 134–144)
eGFR: 77 mL/min/{1.73_m2} (ref >59)

## 2020-06-28 ENCOUNTER — Ambulatory Visit (HOSPITAL_COMMUNITY): Payer: BC Managed Care – PPO | Admitting: Certified Registered"

## 2020-06-28 ENCOUNTER — Encounter (HOSPITAL_COMMUNITY)
Admission: RE | Disposition: A | Discharge status: Home / Self Care | Payer: Self-pay | Source: Home / Self Care | Attending: Cardiology

## 2020-06-28 ENCOUNTER — Other Ambulatory Visit: Payer: Self-pay

## 2020-06-28 ENCOUNTER — Ambulatory Visit (HOSPITAL_COMMUNITY)
Admission: RE | Admit: 2020-06-28 | Discharge: 2020-06-28 | Disposition: A | Discharge status: Home / Self Care | Payer: BC Managed Care – PPO | Attending: Cardiology | Admitting: Cardiology

## 2020-06-28 DIAGNOSIS — I43 Cardiomyopathy in diseases classified elsewhere: Secondary | ICD-10-CM | POA: Insufficient documentation

## 2020-06-28 DIAGNOSIS — I484 Atypical atrial flutter: Secondary | ICD-10-CM | POA: Diagnosis not present

## 2020-06-28 DIAGNOSIS — I110 Hypertensive heart disease with heart failure: Secondary | ICD-10-CM | POA: Diagnosis not present

## 2020-06-28 DIAGNOSIS — Z7901 Long term (current) use of anticoagulants: Secondary | ICD-10-CM | POA: Diagnosis not present

## 2020-06-28 DIAGNOSIS — Z95810 Presence of automatic (implantable) cardiac defibrillator: Secondary | ICD-10-CM | POA: Insufficient documentation

## 2020-06-28 DIAGNOSIS — I4891 Unspecified atrial fibrillation: Secondary | ICD-10-CM | POA: Diagnosis not present

## 2020-06-28 DIAGNOSIS — I4819 Other persistent atrial fibrillation: Secondary | ICD-10-CM | POA: Diagnosis not present

## 2020-06-28 DIAGNOSIS — Z20822 Contact with and (suspected) exposure to covid-19: Secondary | ICD-10-CM | POA: Insufficient documentation

## 2020-06-28 DIAGNOSIS — I421 Obstructive hypertrophic cardiomyopathy: Secondary | ICD-10-CM | POA: Diagnosis not present

## 2020-06-28 DIAGNOSIS — I480 Paroxysmal atrial fibrillation: Secondary | ICD-10-CM | POA: Diagnosis not present

## 2020-06-28 DIAGNOSIS — I5020 Unspecified systolic (congestive) heart failure: Secondary | ICD-10-CM | POA: Diagnosis not present

## 2020-06-28 HISTORY — PX: CARDIOVERSION: SHX1299

## 2020-06-28 SURGERY — CARDIOVERSION
Anesthesia: General

## 2020-06-28 MED ORDER — LIDOCAINE 2% (20 MG/ML) 5 ML SYRINGE
INTRAMUSCULAR | Status: DC | PRN
  Administered 2020-06-28: 60 mg via INTRAVENOUS

## 2020-06-28 MED ORDER — PROPOFOL 10 MG/ML IV BOLUS
INTRAVENOUS | Status: DC | PRN
  Administered 2020-06-28: 70 mg via INTRAVENOUS

## 2020-06-28 MED ORDER — SODIUM CHLORIDE 0.9 % IV SOLN: INTRAVENOUS | Status: DC | PRN

## 2020-06-28 NOTE — Discharge Instructions (Signed)

## 2020-06-28 NOTE — H&P (Signed)
Donald Bond is a 77M with HCM s/p ICD, HFrEF (30-35%) with recent upgrade to BiV-ICS, PAF on Tikosyn and Eliquis who presents for cardioversion.  Denies any missed doses of Eliquis over last 3 weeks. Denies any chest pain or dyspnea.  Current rhythm is V-paced at 70bpm.  [employer] device rep present and interrogated device, confirmed underlying Afib.  GEN: Well nourished, well developed, in no acute distress HEENT: normal Neck: no JVD Cardiac: RRR; no murmurs, rubs, or gallops,no edema  Respiratory:  clear to auscultation bilaterally, normal work of breathing GI: soft, nontender MS: no deformity or atrophy Skin: warm and dry Neuro:  Alert and Oriented x 3 Psych: normal affect  Plan: -Proceed with cardioversion

## 2020-06-28 NOTE — Transfer of Care (Signed)
Immediate Anesthesia Transfer of Care Note  Patient: Donald Bond  Procedure(s) Performed: CARDIOVERSION (N/A )  Patient Location: Endoscopy Unit  Anesthesia Type:General  Level of Consciousness: drowsy  Airway & Oxygen Therapy: Patient Spontanous Breathing  Post-op Assessment: Report given to RN, Post -op Vital signs reviewed and stable and Patient moving all extremities  Post vital signs: Reviewed and stable  Last Vitals:  Vitals Value Taken Time  BP    Temp    Pulse 70 06/28/20 1014  Resp 22 06/28/20 1014  SpO2 95 % 06/28/20 1014  Vitals shown include unvalidated device data.  Last Pain:  Vitals:   06/28/20 0936  TempSrc: Oral  PainSc: 0-No pain         Complications: No complications documented.

## 2020-06-28 NOTE — Anesthesia Postprocedure Evaluation (Signed)
Anesthesia Post Note  Patient: Donald Bond  Procedure(s) Performed: CARDIOVERSION (N/A )     Patient location during evaluation: Endoscopy Anesthesia Type: General Level of consciousness: awake and alert Pain management: pain level controlled Vital Signs Assessment: post-procedure vital signs reviewed and stable Respiratory status: spontaneous breathing, nonlabored ventilation and respiratory function stable Cardiovascular status: blood pressure returned to baseline and stable Postop Assessment: no apparent nausea or vomiting Anesthetic complications: no   No complications documented.  Last Vitals:  Vitals:   06/28/20 0936 06/28/20 1027  BP: 108/78 111/76  Pulse: 69 70  Resp: 16 20  Temp: 36.4 C 36.4 C  SpO2: 99% 96%    Last Pain:  Vitals:   06/28/20 1027  TempSrc: Oral  PainSc: 0-No pain                 Lowella Curb

## 2020-06-28 NOTE — Anesthesia Preprocedure Evaluation (Signed)
Anesthesia Evaluation  Patient identified by MRN, date of birth, ID band Patient awake    Reviewed: Allergy & Precautions, NPO status , Patient's Chart, lab work & pertinent test results  Airway Mallampati: I  TM Distance: >3 FB Neck ROM: Full    Dental  (+) Teeth Intact   Pulmonary neg pulmonary ROS,    breath sounds clear to auscultation       Cardiovascular hypertension, negative cardio ROS  Atrial Fibrillation + Cardiac Defibrillator  Rhythm:Irregular Rate:Abnormal     Neuro/Psych negative neurological ROS  negative psych ROS   GI/Hepatic negative GI ROS, Neg liver ROS,   Endo/Other  negative endocrine ROS  Renal/GU negative Renal ROS  negative genitourinary   Musculoskeletal negative musculoskeletal ROS (+)   Abdominal Normal abdominal exam  (+)   Peds negative pediatric ROS (+)  Hematology negative hematology ROS (+)   Anesthesia Other Findings   Reproductive/Obstetrics negative OB ROS                             Anesthesia Physical  Anesthesia Plan  ASA: IV  Anesthesia Plan: General   Post-op Pain Management:    Induction: Intravenous  PONV Risk Score and Plan: 0 and Propofol infusion  Airway Management Planned: Natural Airway and Simple Face Mask  Additional Equipment: None  Intra-op Plan:   Post-operative Plan:   Informed Consent:   Plan Discussed with: CRNA  Anesthesia Plan Comments: (Echo:  1. Severe asymmetic hypertrophy of the basal septum up to 1.8 cm.  Findings consistent with patient's history of hypertrophic cardiomyopathy,  sigmoid variant. No LVOT obstruction or mitral SAM. Left ventricular  ejection fraction, by estimation, is 25 to  30%. The left ventricle has severely decreased function. The left  ventricle demonstrates global hypokinesis. There is severe asymmetric left  ventricular hypertrophy of the basal-septal segment. Left ventricular   diastolic function could not be evaluated.  2. Right ventricular systolic function is mildly reduced. The right  ventricular size is mildly enlarged. There is moderately elevated  pulmonary artery systolic pressure. The estimated right ventricular  systolic pressure is 60.1 mmHg.  3. Left atrial size was severely dilated.  4. Right atrial size was moderately dilated.  5. Severe mitral regurgitation due to restricted PMVL (IIIB). 2D ERO 0.37  cm2, with R vol 57, RF 58%. The mitral valve is degenerative. Severe  mitral valve regurgitation. No evidence of mitral stenosis.  6. Tricuspid valve regurgitation is moderate to severe.  7. The aortic valve is tricuspid. There is mild thickening of the aortic  valve. Aortic valve regurgitation is mild. No aortic stenosis is present.  8. The inferior vena cava is dilated in size with >50% respiratory  variability, suggesting right atrial pressure of 8 mmHg. )        Anesthesia Quick Evaluation

## 2020-06-28 NOTE — Anesthesia Procedure Notes (Signed)
Procedure Name: General with mask airway Date/Time: 06/28/2020 10:17 AM Performed by: Colon Flattery, CRNA Pre-anesthesia Checklist: Patient identified, Emergency Drugs available, Suction available and Patient being monitored Patient Re-evaluated:Patient Re-evaluated prior to induction Oxygen Delivery Method: Ambu bag Preoxygenation: Pre-oxygenation with 100% oxygen Induction Type: IV induction Placement Confirmation: positive ETCO2 Dental Injury: Teeth and Oropharynx as per pre-operative assessment

## 2020-06-28 NOTE — CV Procedure (Signed)
Procedure:   DCCV  Indication:  Symptomatic atrial fibrillation  Procedure Note:  The patient signed informed consent.  They have had had therapeutic anticoagulation with Eliquis greater than 3 weeks.  Anesthesia was administered by Dr. Hyacinth Meeker and Baker Pierini, CRNA.  Adequate airway was maintained throughout and vital followed per protocol.  They were cardioverted x 1 with 200J of biphasic synchronized energy.  They converted to A-paced, BiV paced rhythm with rate 70, confirmed with device interrogation.  There were no apparent complications.  The patient had normal neuro status and respiratory status post procedure with vitals stable as recorded elsewhere.    Follow up:  They will continue on current medical therapy and follow up with cardiology as scheduled.  Epifanio Lesches, MD 06/28/2020 10:24 AM

## 2020-07-01 ENCOUNTER — Encounter (HOSPITAL_COMMUNITY): Payer: Self-pay | Admitting: Cardiology

## 2020-07-02 ENCOUNTER — Telehealth: Payer: Self-pay | Admitting: Emergency Medicine

## 2020-07-02 NOTE — Telephone Encounter (Signed)
Alert received for ongoing AF with controlled v-rates since 06/30/20 @ 2324. Patient reports he is asymptomatic. DCCV was performed 06/28/20. + Eliquis. Patient will send remote transmission when he gets home from work today to assess if still in AF.

## 2020-07-03 NOTE — Telephone Encounter (Signed)
I would suggest that we have two options.  1-- repeat DCCV and see if he does any beter this time.  2- Amiodarone   we can readdress ablation  later   Thanks SK

## 2020-07-03 NOTE — Telephone Encounter (Signed)
Spoke with pt and advised pt device shows AF.  Appointment scheduled with Francis Dowse 07/05/2020 as Dr Graciela Husbands is out of the office until next week. Discussed Dr Odessa Fleming recommendations as below. Pt states it is becoming increasingly difficult for him and his wife to take off work for these procedures.  He is continuing to monitor rhythm with his Lourena Simmonds mobile as well.  Reviewed ED precautions.  Pt verbalizes understanding and agrees with current plan.

## 2020-07-03 NOTE — Telephone Encounter (Signed)
Transmission received last night at 21:43 pm. Patient is still in AF. Routing to Dr. Graciela Husbands for review and recommendations.

## 2020-07-04 ENCOUNTER — Ambulatory Visit (HOSPITAL_COMMUNITY): Payer: BC Managed Care – PPO | Admitting: Nurse Practitioner

## 2020-07-04 NOTE — Progress Notes (Signed)
Cardiology Office Note Date:  07/05/2020  Patient ID:  Donald, Bond [birth year], MRN [id number] PCP:  Medicine, [facility]  Cardiologist:  Dr. Shirlee Latch Electrophysiologist: Dr. Graciela Husbands    Chief Complaint:  ERAF  History of Present Illness: Donald Bond is a 60 y.o. male with history of HCM (gene +, severe asymmetric septal hypertrophy.  He does not have LVOT obstruction or SAM, jois brother died fo SCD at 36), VT, AFib, LBBB, CAD (RHC/LHC in 12/21 showing 90% stenosis in small ostial D1 and small ostial OM2, no interventional target.  Cardiac output was preserved with normal filling pressures), NICM, chronic CHF (systolic), VHD w/MR, ICD.  He saw Dr. Shirlee Latch 04/16/20, discussed ongoing CTS evaluation for possible MVR and possible epicardial LV lead placement, planned for CPX and stress testing if not a MVR candidate, planned to return to EP for consideration of LV lead/CRT upgrade  He subsequently saw Dr. Graciela Husbands 05/01/20, and notes the final recommendation from CTS/HF teams were to consider CRT upgrade.  He underwent upgrade procedure 05/31/20.  He underwent DCCV 06/28/20  Device clinic alert for an ongoing AFib episode, was rate controlled, in d/w Dr. Graciela Husbands suggested 2 options, a repeat DCCV vs change to amiodarone and felt could be addressed non-urgently He was given an appt to see me to address this. Note that the pt mentioned was becoming increasingly difficult to take time off from work for he and his wife for procedures.  TODAY He feels well. States he has been more active, working on weight loss with success and weights seem to be settled at home between 163-167 He does not think he is retaining fluid No CP or SOB He is rarely aware of his AFib and surprised to hear still today given he feels well and is unaware of it. No near syncope or syncope. He reports his exertional capacity as good and unchanged from post DCCV to today He has been compliant with his  Eliquis, no bleeding or signs of bleeding   Device information BSci CRT-D implanted 05/31/20: his 4th device He has BSci can, RA, Guidant RV lead (from 2000), and a SJM CS lead  11/21/20 VF w/appropriate HV theray, not felt to be pro arrhythmic from Tikosyn or ranolazine, though ranolazine was ultimately stopped  AAD/management hx 2019 Tikosyn is current historically evaluated by Dr. Johney Frame for Afib ablation, the pt declined Has had several DCCV  Past Medical History:  Diagnosis Date  . AICD (automatic cardioverter/defibrillator) present X 4  . CHF (congestive heart failure) (HCC)   . Chronic combined systolic and diastolic congestive heart failure (HCC)   . Headache    "weekly" (06/15/2017)  . History of kidney stones   . Hypertr obst cardiomyop   . ICD--[facility]    Dr. Graciela Husbands  . Mitral regurgitation   . Orthostatic lightheadedness   . Persistent atrial fibrillation (HCC)   . Severe mitral valve regurgitation by Echo 11/24/19 11/26/2019  . SYNCOPE   . TIREDNESS   . Tricuspid regurgitation   . Ventricular fibrillation/polymorphic ventricular tachycardia 06/04/2011  . Ventricular tachycardia (HCC)    rx via ICD    Past Surgical History:  Procedure Laterality Date  . BIV ICD GENERATOR CHANGEOUT N/A 05/31/2020   Procedure: BIV ICD GENERATOR UPGRADE;  Surgeon: Duke Salvia, MD;  Location: [facility] INVASIVE CV LAB;  Service: Cardiovascular;  Laterality: N/A;  . CARDIAC CATHETERIZATION    . CARDIAC DEFIBRILLATOR PLACEMENT  ~ 2000; ~ 2010   "  I've had 4" (06/15/2017)  . CARDIOVERSION  06/29/2016   Procedure: Cardioversion;  Surgeon: Duke Salvia, MD;  Location: [facility] INVASIVE CV LAB;  Service: Cardiovascular;;  . CARDIOVERSION N/A 06/17/2017   Procedure: CARDIOVERSION;  Surgeon: Jake Bathe, MD;  Location: [facility] ENDOSCOPY;  Service: Cardiovascular;  Laterality: N/A;  . CARDIOVERSION N/A 07/11/2019   Procedure: CARDIOVERSION;  Surgeon: Chrystie Nose, MD;  Location: [facility] ENDOSCOPY;  Service:  Cardiovascular;  Laterality: N/A;  . CARDIOVERSION N/A 01/03/2020   Procedure: CARDIOVERSION;  Surgeon: Jake Bathe, MD;  Location: [facility] ENDOSCOPY;  Service: Cardiovascular;  Laterality: N/A;  . CARDIOVERSION N/A 06/28/2020   Procedure: CARDIOVERSION;  Surgeon: Little Ishikawa, MD;  Location: [facility] ENDOSCOPY;  Service: Cardiovascular;  Laterality: N/A;  . Guidant Vitality ICD Impantation--Guidant Vitality T135--11/13/2003    . HYDROCELE EXCISION / REPAIR Left 04/2013  . ICD GENERATOR CHANGEOUT N/A 06/29/2016   Procedure: ICD Generator Changeout;  Surgeon: Duke Salvia, MD;  Location: [facility] INVASIVE CV LAB;  Service: Cardiovascular;  Laterality: N/A;  . LAPAROSCOPIC CHOLECYSTECTOMY  2003  . LITHOTRIPSY  X 1  . RIGHT/LEFT HEART CATH AND CORONARY ANGIOGRAPHY N/A 02/14/2020   Procedure: RIGHT/LEFT HEART CATH AND CORONARY ANGIOGRAPHY;  Surgeon: Laurey Morale, MD;  Location: [facility] INVASIVE CV LAB;  Service: Cardiovascular;  Laterality: N/A;  . TEE WITHOUT CARDIOVERSION N/A 02/14/2020   Procedure: TRANSESOPHAGEAL ECHOCARDIOGRAM (TEE);  Surgeon: Laurey Morale, MD;  Location: [facility] ENDOSCOPY;  Service: Cardiovascular;  Laterality: N/A;    Current Outpatient Medications  Medication Sig Dispense Refill  . acetaminophen (TYLENOL) 325 MG tablet Take 650 mg by mouth every 6 (six) hours as needed for mild pain or moderate pain (for pain.).    Marland Kitchen bisoprolol (ZEBETA) 5 MG tablet TAKE 1 TABLET (5 MG TOTAL) BY MOUTH IN THE MORNING AND AT BEDTIME. 180 tablet 3  . digoxin (LANOXIN) 0.125 MG tablet Take 1 tablet (0.125 mg total) by mouth daily. (Patient taking differently: Take 0.125 mg by mouth in the morning.) 90 tablet 3  . dofetilide (TIKOSYN) 500 MCG capsule Take 1 capsule (500 mcg total) by mouth 2 (two) times daily. 60 capsule 12  . ELIQUIS 5 MG TABS tablet TAKE 1 TABLET BY MOUTH TWICE A DAY (Patient taking differently: Take 5 mg by mouth 2 (two) times daily.) 180 tablet 6  . empagliflozin (JARDIANCE) 10  MG TABS tablet Take 1 tablet (10 mg total) by mouth daily before breakfast. 90 tablet 3  . loratadine (CLARITIN) 10 MG tablet Take 10 mg by mouth daily as needed (seasonal allergies).    . magnesium oxide (MAG-OX) 400 MG tablet TAKE 1 TABLET BY MOUTH EVERY DAY (Patient taking differently: Take 400 mg by mouth in the morning.) 30 tablet 11  . rosuvastatin (CRESTOR) 10 MG tablet Take 1 tablet (10 mg total) by mouth daily. (Patient taking differently: Take 10 mg by mouth at bedtime.) 30 tablet 11  . sacubitril-valsartan (ENTRESTO) 49-51 MG Take 1 tablet by mouth 2 (two) times daily. 60 tablet 11  . spironolactone (ALDACTONE) 25 MG tablet Take 1 tablet (25 mg total) by mouth daily. (Patient taking differently: Take 25 mg by mouth in the morning.) 90 tablet 3   No current facility-administered medications for this visit.    Allergies:   Patient has no known allergies.   Social History:  The patient  reports that he has never smoked. He quit smokeless tobacco use about 27 years ago.  His smokeless tobacco use included chew. He  reports current alcohol use of about 3.0 standard drinks of alcohol per week. He reports that he does not use drugs.   Family History:  The patient's family history includes ALS in his father; Heart attack in his brother; Hypertension in an other family member.  ROS:  Please see the history of present illness.    All other systems are reviewed and otherwise negative.   PHYSICAL EXAM:  VS:  BP 100/72   Pulse 76   Ht 5\' 7"  (1.702 m)   Wt 170 lb (77.1 kg)   BMI 26.63 kg/m  BMI: Body mass index is 26.63 kg/m. Well nourished, well developed, in no acute distress HEENT: normocephalic, atraumatic Neck: no JVD, carotid bruits or masses Cardiac:  RRR; no significant murmurs, no rubs, or gallops Lungs:  CTA b/l, no wheezing, rhonchi or rales Abd: soft, nontender MS: no deformity or atrophy Ext: no edema Skin: warm and dry, no rash Neuro:  No gross deficits  appreciated Psych: euthymic mood, full affect  ICD site is stable, no tethering or discomfort   EKG:  Done today and reviewed bymyyself Coarse AFib VP w/PVCs, rate 91 Manually measured QT 440-480, QRS , QTc 468-517 +V1, neg lead I  Device interrogation done today and reviewed by myself:  Battery and lead measurements are stable A lead remains at acute output Thresholds all c/w last Looks like he has been in AF since 5.1.22 Base rate is programmed at 70 BP 94-95%    04/18/2020: stress echo IMPRESSIONS  1. Stress echo to assess mitral regurgitation at rest and with stress. At  rest, mitral regurgitation appears mild. With stress, mitral regurgitation  appears no more than moderate.  2. This is an inconclusive stress echocardiogram for ischemia. Global  hypokinesis.   04/18/2020: CPX Attending: Severe circulatory limitation with very high Ve/VCO2 slope and minimal BP response to exercise. Given equivocal results of stress echo, would consider repeat exercise testing with invasive hemodynamics, That said, given results of this test, I suspect there is a severe elevation in left atrial and pulmonary pressures during exercise.   02/14/2020; LHC 1. Nonsignificant coronary disease.  Nonischemic cardiomyopathy.  2. Normal filling pressures with mild pulmonary hypertension.  3. Preserved cardiac output.     02/14/2020: TEE IMPRESSIONS  1. Left ventricular ejection fraction, by estimation, is 30 to 35%. The  left ventricle has moderately decreased function. The left ventricle  demonstrates global hypokinesis. There is severe basal septal left  ventricular hypertrophy. No mitral valve SAM  or LVOT gradient noted. Patient has known history of hypertrophic  cardiomyopathy.  2. Right ventricular systolic function is mildly reduced. The right  ventricular size is normal.  3. Left atrial size was severely dilated. No left atrial/left atrial  appendage thrombus was detected.   4. Right atrial size was moderately dilated.  5. The posterior mitral leaflet is thickened and restricted. There are 2  jets of mitral regurgitation, combined PISA ERO is 0.3 cm^2. The pulmonary  vein systolic doppler signal is flattened but not reversed. 3+ mitral  regurgitation, suspect Carpentier  IIIb. No evidence of mitral stenosis.  6. Peak RV-RA gradient 30 mmHg. Tricuspid valve regurgitation is  moderate.  7. The aortic valve is tricuspid. Aortic valve regurgitation is mild. No  aortic stenosis is present.  8. No ASD or PFO by color doppler.  9. Normal caliber thoracic aorta with minimal plaque.    11/24/2019: TTE IMPRESSIONS  1. Severe asymmetic hypertrophy of the basal septum up to 1.8  cm.  Findings consistent with patient's history of hypertrophic cardiomyopathy,  sigmoid variant. No LVOT obstruction or mitral SAM. Left ventricular  ejection fraction, by estimation, is 25 to  30%. The left ventricle has severely decreased function. The left  ventricle demonstrates global hypokinesis. There is severe asymmetric left  ventricular hypertrophy of the basal-septal segment. Left ventricular  diastolic function could not be evaluated.  2. Right ventricular systolic function is mildly reduced. The right  ventricular size is mildly enlarged. There is moderately elevated  pulmonary artery systolic pressure. The estimated right ventricular  systolic pressure is 60.1 mmHg.  3. Left atrial size was severely dilated.  4. Right atrial size was moderately dilated.  5. Severe mitral regurgitation due to restricted PMVL (IIIB). 2D ERO 0.37  cm2, with R vol 57, RF 58%. The mitral valve is degenerative. Severe  mitral valve regurgitation. No evidence of mitral stenosis.  6. Tricuspid valve regurgitation is moderate to severe.  7. The aortic valve is tricuspid. There is mild thickening of the aortic  valve. Aortic valve regurgitation is mild. No aortic stenosis is present.   8. The inferior vena cava is dilated in size with >50% respiratory  variability, suggesting right atrial pressure of 8 mmHg.   Comparison(s): Changes from prior study are noted. EF ~25-30%. Severe MR.  Moderate to Severe TR. HCM.    Recent Labs: 11/24/2019: TSH 3.229 12/26/2019: Magnesium 2.3 01/17/2020: B Natriuretic Peptide 461.3 03/27/2020: ALT 18 06/26/2020: BUN 17; Creatinine, Ser 1.10; Hemoglobin 17.1; Platelets 181; Potassium 4.7; Sodium 139  03/27/2020: Cholesterol 152; HDL 63; LDL Cholesterol 74; Total CHOL/HDL Ratio 2.4; Triglycerides 75; VLDL 15   Estimated Creatinine Clearance: 67.6 mL/min (by C-G formula based on SCr of 1.1 mg/dL).   Wt Readings from Last 3 Encounters:  07/05/20 170 lb (77.1 kg)  06/28/20 163 lb (73.9 kg)  05/31/20 165 lb (74.8 kg)     Other studies reviewed: Additional studies/records reviewed today include: summarized above  ASSESSMENT AND PLAN:  1. CRT-D     Intact function  He has PVCs, I think are affecting BP% and increased base pacing rate to 80  2. HCM 3. NICM 4. Chronic CHF     No heart failure data on device yet  On BB, entresto, dig, aldactone No symptoms or exam findings to suggest volume OL QRS morphology c/w CRT pacing, QRS width largely unchanged, ?slightly longer PVCs noted today, I think affect Bp%  BP I dont think will allow more BB, I have increased his base pacing rate to 80 for now Dig level looked OK in Feb He sees Dr. Shirlee Latch next week  5. Persistent Afib     CHA2DS2Vasc is 2 (CHF, CAD), on eliquis, appropriately dosed      increasing burden despite tikosyn     He has an ICD and QTc is acceptable      We dicussed amiodarone, potential side effects of course are less then appealing to him  He is 94-95% BP so rates are largely controlled He feels well and is inclined to hold off on any changes right now. He would like to wait a couple weeks or so and see how his rhythm lands on the [place] Does not want to pursue  DCCV again just yet  Mag today, K+/Creat were OK   I will send my note to Drs. Graciela Husbands and [place]       6. VF     Not felt 2/2 Tikosyn     None further  Disposition: F/u as above  Current medicines are reviewed at length with the patient today.  The patient did not have any concerns regarding medicines.  Norma FredricksonSigned, Furqan Gosselin, PA-C 07/05/2020 1:02 PM     [facility] [address] [place] [address] [phone] (office)  [phone] (fax)

## 2020-07-05 ENCOUNTER — Other Ambulatory Visit: Payer: Self-pay

## 2020-07-05 ENCOUNTER — Encounter: Payer: Self-pay | Admitting: Physician Assistant

## 2020-07-05 ENCOUNTER — Ambulatory Visit: Payer: BC Managed Care – PPO | Admitting: Physician Assistant

## 2020-07-05 VITALS — BP 100/72 | HR 76 | Ht 67.0 in | Wt 170.0 lb

## 2020-07-05 DIAGNOSIS — Z95810 Presence of automatic (implantable) cardiac defibrillator: Secondary | ICD-10-CM

## 2020-07-05 DIAGNOSIS — I429 Cardiomyopathy, unspecified: Secondary | ICD-10-CM | POA: Diagnosis not present

## 2020-07-05 DIAGNOSIS — Z79899 Other long term (current) drug therapy: Secondary | ICD-10-CM | POA: Diagnosis not present

## 2020-07-05 DIAGNOSIS — I422 Other hypertrophic cardiomyopathy: Secondary | ICD-10-CM

## 2020-07-05 DIAGNOSIS — Z5181 Encounter for therapeutic drug level monitoring: Secondary | ICD-10-CM | POA: Diagnosis not present

## 2020-07-05 DIAGNOSIS — I428 Other cardiomyopathies: Secondary | ICD-10-CM

## 2020-07-05 DIAGNOSIS — I5042 Chronic combined systolic (congestive) and diastolic (congestive) heart failure: Secondary | ICD-10-CM

## 2020-07-05 DIAGNOSIS — I4819 Other persistent atrial fibrillation: Secondary | ICD-10-CM

## 2020-07-05 LAB — CUP PACEART INCLINIC DEVICE CHECK
Date Time Interrogation Session: 20220506191419
HighPow Impedance: 51 Ohm
Implantable Lead Implant Date: 20000815
Implantable Lead Implant Date: 20220401
Implantable Lead Implant Date: 20220401
Implantable Lead Location: 753858
Implantable Lead Location: 753859
Implantable Lead Location: 753860
Implantable Lead Model: 144
Implantable Lead Model: 7841
Implantable Lead Serial Number: 1129476
Implantable Lead Serial Number: 334759
Implantable Pulse Generator Implant Date: 20220401
Lead Channel Impedance Value: 1186 Ohm
Lead Channel Impedance Value: 621 Ohm
Lead Channel Impedance Value: 875 Ohm
Lead Channel Pacing Threshold Amplitude: 0.4 V
Lead Channel Pacing Threshold Amplitude: 1 V
Lead Channel Pacing Threshold Amplitude: 2.7 V
Lead Channel Pacing Threshold Pulse Width: 0.4 ms
Lead Channel Pacing Threshold Pulse Width: 0.4 ms
Lead Channel Pacing Threshold Pulse Width: 0.5 ms
Lead Channel Sensing Intrinsic Amplitude: 13.4 mV
Lead Channel Sensing Intrinsic Amplitude: 25 mV
Lead Channel Sensing Intrinsic Amplitude: 3.1 mV
Lead Channel Setting Pacing Amplitude: 2.2 V
Lead Channel Setting Pacing Amplitude: 3.5 V
Lead Channel Setting Pacing Amplitude: 4 V
Lead Channel Setting Pacing Pulse Width: 0.4 ms
Lead Channel Setting Pacing Pulse Width: 0.5 ms
Lead Channel Setting Sensing Sensitivity: 0.5 mV
Lead Channel Setting Sensing Sensitivity: 1 mV
Pulse Gen Serial Number: 389080

## 2020-07-05 NOTE — Patient Instructions (Addendum)
Medication Instructions:    Your physician recommends that you continue on your current medications as directed. Please refer to the Current Medication list given to you today.  *If you need a refill on your cardiac medications before your next appointment, please call your pharmacy*   Lab Work:  MAG TODAY    If you have labs (blood work) drawn today and your tests are completely normal, you will receive your results only by: Marland Kitchen MyChart Message (if you have MyChart) OR . A paper copy in the mail If you have any lab test that is abnormal or we need to change your treatment, we will call you to review the results.   Testing/Procedures: NONE ORDERED  TODAY     Follow-Up: At [facility], you and your health needs are our priority.  As part of our continuing mission to provide you with exceptional heart care, we have created designated Provider Care Teams.  These Care Teams include your primary Cardiologist (physician) and Advanced Practice Providers (APPs -  Physician Assistants and Nurse Practitioners) who all work together to provide you with the care you need, when you need it.  We recommend signing up for the patient portal called "MyChart".  Sign up information is provided on this After Visit Summary.  MyChart is used to connect with patients for Virtual Visits (Telemedicine).  Patients are able to view lab/test results, encounter notes, upcoming appointments, etc.  Non-urgent messages can be sent to your provider as well.   To learn more about what you can do with MyChart, go to [email or web address].    Your next appointment:   AS SCHEDULED    Other Instructions

## 2020-07-06 LAB — MAGNESIUM: Magnesium: 2.4 mg/dL — ABNORMAL HIGH (ref 1.6–2.3)

## 2020-07-15 ENCOUNTER — Encounter (HOSPITAL_COMMUNITY): Payer: Self-pay | Admitting: Cardiology

## 2020-07-15 ENCOUNTER — Other Ambulatory Visit (HOSPITAL_COMMUNITY): Payer: Self-pay | Admitting: *Deleted

## 2020-07-15 ENCOUNTER — Other Ambulatory Visit: Payer: Self-pay

## 2020-07-15 ENCOUNTER — Ambulatory Visit (HOSPITAL_COMMUNITY)
Admission: RE | Admit: 2020-07-15 | Discharge: 2020-07-15 | Disposition: A | Discharge status: Home / Self Care | Payer: BC Managed Care – PPO | Source: Ambulatory Visit | Attending: Cardiology | Admitting: Cardiology

## 2020-07-15 VITALS — BP 104/70 | HR 90 | Wt 173.4 lb

## 2020-07-15 DIAGNOSIS — I472 Ventricular tachycardia: Secondary | ICD-10-CM | POA: Insufficient documentation

## 2020-07-15 DIAGNOSIS — Z7901 Long term (current) use of anticoagulants: Secondary | ICD-10-CM | POA: Insufficient documentation

## 2020-07-15 DIAGNOSIS — Z8249 Family history of ischemic heart disease and other diseases of the circulatory system: Secondary | ICD-10-CM | POA: Insufficient documentation

## 2020-07-15 DIAGNOSIS — I422 Other hypertrophic cardiomyopathy: Secondary | ICD-10-CM | POA: Insufficient documentation

## 2020-07-15 DIAGNOSIS — Z95810 Presence of automatic (implantable) cardiac defibrillator: Secondary | ICD-10-CM | POA: Insufficient documentation

## 2020-07-15 DIAGNOSIS — I4819 Other persistent atrial fibrillation: Secondary | ICD-10-CM | POA: Diagnosis not present

## 2020-07-15 DIAGNOSIS — I2510 Atherosclerotic heart disease of native coronary artery without angina pectoris: Secondary | ICD-10-CM | POA: Diagnosis not present

## 2020-07-15 DIAGNOSIS — Z79899 Other long term (current) drug therapy: Secondary | ICD-10-CM | POA: Diagnosis not present

## 2020-07-15 DIAGNOSIS — Z7984 Long term (current) use of oral hypoglycemic drugs: Secondary | ICD-10-CM | POA: Diagnosis not present

## 2020-07-15 DIAGNOSIS — I5042 Chronic combined systolic (congestive) and diastolic (congestive) heart failure: Secondary | ICD-10-CM

## 2020-07-15 DIAGNOSIS — I340 Nonrheumatic mitral (valve) insufficiency: Secondary | ICD-10-CM | POA: Insufficient documentation

## 2020-07-15 LAB — BASIC METABOLIC PANEL
Anion gap: 6 (ref 5–15)
BUN: 16 mg/dL (ref 6–20)
CO2: 25 mmol/L (ref 22–32)
Calcium: 9.3 mg/dL (ref 8.9–10.3)
Chloride: 104 mmol/L (ref 98–111)
Creatinine, Ser: 0.93 mg/dL (ref 0.61–1.24)
GFR, Estimated: >60 mL/min (ref >60)
Glucose, Bld: 89 mg/dL (ref 70–99)
Potassium: 4.4 mmol/L (ref 3.5–5.1)
Sodium: 135 mmol/L (ref 135–145)

## 2020-07-15 LAB — CBC
HCT: 49.2 % (ref 39.0–52.0)
Hemoglobin: 16.5 g/dL (ref 13.0–17.0)
MCH: 30.8 pg (ref 26.0–34.0)
MCHC: 33.5 g/dL (ref 30.0–36.0)
MCV: 91.8 fL (ref 80.0–100.0)
Platelets: 152 10*3/uL (ref 150–400)
RBC: 5.36 MIL/uL (ref 4.22–5.81)
RDW: 13.4 % (ref 11.5–15.5)
WBC: 5.5 10*3/uL (ref 4.0–10.5)
nRBC: 0 % (ref 0.0–0.2)

## 2020-07-15 LAB — DIGOXIN LEVEL: Digoxin Level: 0.8 ng/mL (ref 0.8–2.0)

## 2020-07-15 NOTE — H&P (View-Only) (Signed)
PCP: Medicine, [facility] EP: Dr. Graciela Husbands HF Cardiology: Dr. Shirlee Latch  60 y.o. with hypertrophic nonobstructive cardiomyopathy (gene+) was referred by Dr. Graciela Husbands for evaluation of newly noted systolic dysfunction.  Patient has HCM as evidenced by severe asymmetric septal hypertrophy.  He does not have LVOT obstruction or SAM.  His brother had SCD at 29, presumable due to HCM, and patient has a Environmental manager ICD.  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  Patient has struggled with atrial fibrillation.  He was seen by Dr. Johney Frame a couple of years ago and decided against AF ablation.  He is currently maintained on Tikosyn.  He was on ranolazone + Tikosyn but developed VT x 2 on this combination and ranolazine was stopped.  Most recently, had DCCV 01/03/20.  He is now back in atrial fibrillation.  He feels the atrial fibrillation when his rate gets fast (>100 bpm).  Today, rate is controlled in the 70s.  Echo in 4/18 showed EF 55-60%, but echo in 9/21 showed EF down to 25-30% with moderate-severe MR (no SAM).   Patient had RHC/LHC in 12/21 showing 90% stenosis in small ostial D1 and small ostial OM2, no interventional target.  Cardiac output was preserved with normal filling pressures.  TEE showed EF 30-35% with 3+ mitral regurgitation.   Patient saw Dr. Cornelius Moras for evaluation for MV repair, surgical Maze, epicardial LV lead.  Stress echo in 2/22 showed mild MR at rest and moderate MR at stress.  It was decided that MR was not severe enough to warrant surgery.   CPX in 2/22 showed moderate-severe HF functional limitation.   Patient had CRT upgrade in 4/22 then DCCV later in 4/22 to NSR.   Patient returns for followup of CHF.  He is back in atrial flutter today.  He actually feels good.  He has been working full time.  No significant exertional dyspnea.  No chest pain.  No lightheadedness.  No  orthopnea/PND.  When he saw Renee at EP clinic recently, he was BiV pacing 95% of the time.  However, today, he is BiV pacing 78% of the time per interrogation.   ECG (personally reviewed): atrial flutter with BiV pacing  [employer] ICD interrogation: 78% BiV pacing, in atrial flutter, Heartlogic 2.    Labs (10/21): K 4.3, creatinine 0.75 Labs (11/21): K 5.1, creatinine 0.9 Labs (1/22): LDL 74, HDL 63, K 4.3, creatinine 9.79 Labs (4/22): K 4.7, creatinine 1.1  PMH: 1. Atrial fibrillation: Paroxysmal.  Was on ranolazine + Tikosyn but had VT, ranolazine stopped.  Now on Tikosyn.  - Most recent DCCV 11/21. 2. Hypertrophic cardiomyopathy: No LVOT obstruction or SAM.  Gene+.  Brother with SCD at 36.  - [employer] ICD - Echo (4/18): Severe asymmetric septal hypertrophy, no SAM, moderate MR, EF 55-60%.  - Echo (9/21): Severe asymmetric septal hypertrophy, no SAM or LVOT obstruction, EF 25-30%, mildly decreased RV systolic function, PASP 60 mmHg, severe LAE, severe MR with restricted posterior leaflet.  - TEE (12/21): severe asymmetric basal septal hypertrophy, EF 30-35%, mildly decreased RV systolic function, severe LAE, moderate TR, mild AI, restricted posterior MV leaflet with 2 MR jets, ERO 0.3 (suspect 3+ MR, Carpentier IIIb).  - CPX (2/22): RER 1.28, VE/VCO2 47, peak VO2 18.5.  Moderate-severe functional limitation due to HF, suspect severe elevation LA pressure/PA pressure with exercise.  - 4/22 upgrade to [employer] CRT-D.  3.  Chronic systolic CHF: Echo (9/21): Severe asymmetric septal hypertrophy, no SAM or LVOT obstruction, EF 25-30%, mildly decreased RV systolic function, PASP 60 mmHg, severe LAE, severe MR with restricted posterior leaflet.  - LHC/RHC (12/21): 90% stenosis in small ostial D1 and small ostial OM2, no interventional target. Mean RA 3, PA 38/14, mean PCWP 14 with v waves to 22, CI 2.66, PVR 2.1 WU.  4. VT: 9/21, suspected to be due to ranolazine + Tikosyn  combination, ranolazine stopped.  5. H/o nephrolithiasis.  6. Mitral regurgitation: Moderate to severe by my review of 9/21 echo,  restriction of posterior leaflet.  - TEE (12/21): Restricted posterior MV leaflet with 2 MR jets, ERO 0.3 (suspect 3+ MR, Carpentier IIIb). - Stress echo (2/22): mild MR at rest, moderate MR with stress.  7. LBBB  FH: Mother with atrial fibrillation.  Brother with SCD at 24.  Son also with HCM gene+.   Social History   Socioeconomic History  . Marital status: Married    Spouse name: Not on file  . Number of children: Not on file  . Years of education: Not on file  . Highest education level: Not on file  Occupational History  . Not on file  Tobacco Use  . Smoking status: Never Smoker  . Smokeless tobacco: Former Neurosurgeon    Types: Engineer, drilling  . Vaping Use: Never used  Substance and Sexual Activity  . Alcohol use: Yes    Alcohol/week: 3.0 standard drinks    Types: 3 Cans of beer per week  . Drug use: No  . Sexual activity: Yes  Other Topics Concern  . Not on file  Social History Narrative   Lives with spouse in [place]   employed   Social Determinants of Health   Financial Resource Strain: Not on file  Food Insecurity: Not on file  Transportation Needs: Not on file  Physical Activity: Not on file  Stress: Not on file  Social Connections: Not on file  Intimate Partner Violence: Not on file   ROS: All systems reviewed and negative except as per HPI.   Current Outpatient Medications  Medication Sig Dispense Refill  . acetaminophen (TYLENOL) 325 MG tablet Take 650 mg by mouth every 6 (six) hours as needed for mild pain or moderate pain (for pain.).    Marland Kitchen bisoprolol (ZEBETA) 5 MG tablet TAKE 1 TABLET (5 MG TOTAL) BY MOUTH IN THE MORNING AND AT BEDTIME. 180 tablet 3  . digoxin (LANOXIN) 0.125 MG tablet Take 1 tablet (0.125 mg total) by mouth daily. 90 tablet 3  . dofetilide (TIKOSYN) 500 MCG capsule Take 1 capsule (500 mcg total) by  mouth 2 (two) times daily. 60 capsule 12  . ELIQUIS 5 MG TABS tablet TAKE 1 TABLET BY MOUTH TWICE A DAY 180 tablet 6  . empagliflozin (JARDIANCE) 10 MG TABS tablet Take 1 tablet (10 mg total) by mouth daily before breakfast. 90 tablet 3  . loratadine (CLARITIN) 10 MG tablet Take 10 mg by mouth daily as needed (seasonal allergies).    . magnesium oxide (MAG-OX) 400 MG tablet TAKE 1 TABLET BY MOUTH EVERY DAY 30 tablet 11  . rosuvastatin (CRESTOR) 10 MG tablet Take 1 tablet (10 mg total) by mouth daily. 30 tablet 11  . sacubitril-valsartan (ENTRESTO) 49-51 MG Take 1 tablet by mouth 2 (two) times daily. 60 tablet 11  . spironolactone (ALDACTONE) 25 MG tablet Take 1 tablet (25 mg total) by mouth daily. 90 tablet 3  No current facility-administered medications for this encounter.   BP 104/70   Pulse 90   Wt 78.7 kg (173 lb 6.4 oz)   SpO2 97%   BMI 27.16 kg/m  General: NAD Neck: No JVD, no thyromegaly or thyroid nodule.  Lungs: Clear to auscultation bilaterally with normal respiratory effort. CV: Nondisplaced PMI.  Heart regular S1/S2, no S3/S4, no murmur.  No peripheral edema.  No carotid bruit.  Normal pedal pulses.  Abdomen: Soft, nontender, no hepatosplenomegaly, no distention.  Skin: Intact without lesions or rashes.  Neurologic: Alert and oriented x 3.  Psych: Normal affect. Extremities: No clubbing or cyanosis.  HEENT: Normal.    Assessment/Plan: 1. Chronic systolic CHF: Echo in 9/21 showed severe asymmetric septal hypertrophy, no SAM or LVOT obstruction, EF 25-30%, mildly decreased RV systolic function, PASP 60 mmHg, severe LAE, severe MR with restricted posterior leaflet.  EF was lower than in the past (normal in 4/18) and mitral regurgitation was worse.  LHC/RHC in 12/21 showed 90% stenosis small D1 and 90% stenosis small OM2 (CAD does not explain cardiomyopathy) and normal filling pressures/preserved cardiac output.  [employer] ICD.  Cardiomyopathy could be natural history of  HCM with worsening of function over time.  CPX in 2/22 showed moderate-severe functional limitation.  [employer] CRT-D upgrade in 4/22, today only pacing 78% of the time.  He is back in atrial flutter today.  NYHA class II, euvolemic on exam and by Heartlogic.   - Continue bisoprolol 5 mg bid.  - Continue Entresto 49/51 bid, do not think he has BP room to increase. BMET today.  - Continue dapagliflozin 10 mg daily.   - Continue digoxin, check level today.  - Continue spironolactone 25 mg daily.  - I do not think he needs Lasix.   - Low BiV pacing frequency today while he is in atrial flutter.  Discussed with Dr. Graciela Husbands, will attempt repeat DCCV.  2. Atrial fibrillation/flutter: Paroxysmal => persistent.  He had DCCV 01/03/20 but went back into atrial fibrillation despite Tikosyn use.  He is off ranolazine due to VT with combination of Tikosyn and ranolazine.  Atrial fibrillation ablation likely has a lower chance of success here due to the size of the left atrium (severely enlarged).  DCCV in 4/22 was successful but now back in atrial flutter.  BiV pacing percentage is down to 78% by device interrogation today.  Rate is controlled.  - Continue bisoprolol 5 mg bid for rate control.  - Continue apixaban 5 mg bid, has not missed doses.  - Discussed with Dr. Graciela Husbands, will re-attempt DCCV (discussed risks/benefits with patient today, he agrees to procedure).   If this cardioversion fails, will need to consider transition to amiodarone and stopping dofetilide.  Patient is understandably concerned about long-term amiodarone.  3. Mitral regurgitation: TEE in 12/21 with 3+ MR, Carpentier IIIb.  Stress echo in 4/22 showed moderate MR with stress.  He saw Dr. Cornelius Moras, MR not thought bad enough to warrant surgical repair. CRT upgrade may help limit MR in the long-term.  4. Hypertrophic cardiomyopathy: No SAM or LVOT obstruction, has asymmetric septal hypertrophy.  Gene+, brother with SCD at 46.   - Would like him  to bring [place] his copy of the genetic testing results just to make a record of it.  5. VT: In 9/21, suspect due to combination of ranolazone and Tikosyn with QT prolongation.  Now off ranolazine.  6. CAD: LHC (12/21) with 90% ostial small D1 and 90% ostial small  OM2.  CAD does not explain the extent of his cardiomyopathy.   - Continue Crestor, good lipids in 1/22.   - No ASA given Eliquis use.   Followup with APP in 1 month after repeat DCCV.  If this fails, will need to consider transition to amiodarone.   Donald Bond 07/15/2020

## 2020-07-15 NOTE — Progress Notes (Signed)
PCP: Medicine, [facility] EP: Dr. Klein HF Cardiology: Dr. Reshonda Koerber  60 y.o. with hypertrophic nonobstructive cardiomyopathy (gene+) was referred by Dr. Klein for evaluation of newly noted systolic dysfunction.  Patient has HCM as evidenced by severe asymmetric septal hypertrophy.  He does not have LVOT obstruction or SAM.  His brother had SCD at 42, presumable due to HCM, and patient has a [employer] ICD.  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  Patient has struggled with atrial fibrillation.  He was seen by Dr. Allred a couple of years ago and decided against AF ablation.  He is currently maintained on Tikosyn.  He was on ranolazone + Tikosyn but developed VT x 2 on this combination and ranolazine was stopped.  Most recently, had DCCV 01/03/20.  He is now back in atrial fibrillation.  He feels the atrial fibrillation when his rate gets fast (>100 bpm).  Today, rate is controlled in the 70s.  Echo in 4/18 showed EF 55-60%, but echo in 9/21 showed EF down to 25-30% with moderate-severe MR (no SAM).   Patient had RHC/LHC in 12/21 showing 90% stenosis in small ostial D1 and small ostial OM2, no interventional target.  Cardiac output was preserved with normal filling pressures.  TEE showed EF 30-35% with 3+ mitral regurgitation.   Patient saw Dr. Owen for evaluation for MV repair, surgical Maze, epicardial LV lead.  Stress echo in 2/22 showed mild MR at rest and moderate MR at stress.  It was decided that MR was not severe enough to warrant surgery.   CPX in 2/22 showed moderate-severe HF functional limitation.   Patient had CRT upgrade in 4/22 then DCCV later in 4/22 to NSR.   Patient returns for followup of CHF.  He is back in atrial flutter today.  He actually feels good.  He has been working full time.  No significant exertional dyspnea.  No chest pain.  No lightheadedness.  No  orthopnea/PND.  When he saw Renee at EP clinic recently, he was BiV pacing 95% of the time.  However, today, he is BiV pacing 78% of the time per interrogation.   ECG (personally reviewed): atrial flutter with BiV pacing  [employer] ICD interrogation: 78% BiV pacing, in atrial flutter, Heartlogic 2.    Labs (10/21): K 4.3, creatinine 0.75 Labs (11/21): K 5.1, creatinine 0.9 Labs (1/22): LDL 74, HDL 63, K 4.3, creatinine 0.97 Labs (4/22): K 4.7, creatinine 1.1  PMH: 1. Atrial fibrillation: Paroxysmal.  Was on ranolazine + Tikosyn but had VT, ranolazine stopped.  Now on Tikosyn.  - Most recent DCCV 11/21. 2. Hypertrophic cardiomyopathy: No LVOT obstruction or SAM.  Gene+.  Brother with SCD at 42.  - [employer] ICD - Echo (4/18): Severe asymmetric septal hypertrophy, no SAM, moderate MR, EF 55-60%.  - Echo (9/21): Severe asymmetric septal hypertrophy, no SAM or LVOT obstruction, EF 25-30%, mildly decreased RV systolic function, PASP 60 mmHg, severe LAE, severe MR with restricted posterior leaflet.  - TEE (12/21): severe asymmetric basal septal hypertrophy, EF 30-35%, mildly decreased RV systolic function, severe LAE, moderate TR, mild AI, restricted posterior MV leaflet with 2 MR jets, ERO 0.3 (suspect 3+ MR, Carpentier IIIb).  - CPX (2/22): RER 1.28, VE/VCO2 47, peak VO2 18.5.  Moderate-severe functional limitation due to HF, suspect severe elevation LA pressure/PA pressure with exercise.  - 4/22 upgrade to [employer] CRT-D.  3.   Chronic systolic CHF: Echo (9/21): Severe asymmetric septal hypertrophy, no SAM or LVOT obstruction, EF 25-30%, mildly decreased RV systolic function, PASP 60 mmHg, severe LAE, severe MR with restricted posterior leaflet.  - LHC/RHC (12/21): 90% stenosis in small ostial D1 and small ostial OM2, no interventional target. Mean RA 3, PA 38/14, mean PCWP 14 with v waves to 22, CI 2.66, PVR 2.1 WU.  4. VT: 9/21, suspected to be due to ranolazine + Tikosyn  combination, ranolazine stopped.  5. H/o nephrolithiasis.  6. Mitral regurgitation: Moderate to severe by my review of 9/21 echo,  restriction of posterior leaflet.  - TEE (12/21): Restricted posterior MV leaflet with 2 MR jets, ERO 0.3 (suspect 3+ MR, Carpentier IIIb). - Stress echo (2/22): mild MR at rest, moderate MR with stress.  7. LBBB  FH: Mother with atrial fibrillation.  Brother with SCD at 24.  Son also with HCM gene+.   Social History   Socioeconomic History  . Marital status: Married    Spouse name: Not on file  . Number of children: Not on file  . Years of education: Not on file  . Highest education level: Not on file  Occupational History  . Not on file  Tobacco Use  . Smoking status: Never Smoker  . Smokeless tobacco: Former Neurosurgeon    Types: Engineer, drilling  . Vaping Use: Never used  Substance and Sexual Activity  . Alcohol use: Yes    Alcohol/week: 3.0 standard drinks    Types: 3 Cans of beer per week  . Drug use: No  . Sexual activity: Yes  Other Topics Concern  . Not on file  Social History Narrative   Lives with spouse in [place]   employed   Social Determinants of Health   Financial Resource Strain: Not on file  Food Insecurity: Not on file  Transportation Needs: Not on file  Physical Activity: Not on file  Stress: Not on file  Social Connections: Not on file  Intimate Partner Violence: Not on file   ROS: All systems reviewed and negative except as per HPI.   Current Outpatient Medications  Medication Sig Dispense Refill  . acetaminophen (TYLENOL) 325 MG tablet Take 650 mg by mouth every 6 (six) hours as needed for mild pain or moderate pain (for pain.).    Marland Kitchen bisoprolol (ZEBETA) 5 MG tablet TAKE 1 TABLET (5 MG TOTAL) BY MOUTH IN THE MORNING AND AT BEDTIME. 180 tablet 3  . digoxin (LANOXIN) 0.125 MG tablet Take 1 tablet (0.125 mg total) by mouth daily. 90 tablet 3  . dofetilide (TIKOSYN) 500 MCG capsule Take 1 capsule (500 mcg total) by  mouth 2 (two) times daily. 60 capsule 12  . ELIQUIS 5 MG TABS tablet TAKE 1 TABLET BY MOUTH TWICE A DAY 180 tablet 6  . empagliflozin (JARDIANCE) 10 MG TABS tablet Take 1 tablet (10 mg total) by mouth daily before breakfast. 90 tablet 3  . loratadine (CLARITIN) 10 MG tablet Take 10 mg by mouth daily as needed (seasonal allergies).    . magnesium oxide (MAG-OX) 400 MG tablet TAKE 1 TABLET BY MOUTH EVERY DAY 30 tablet 11  . rosuvastatin (CRESTOR) 10 MG tablet Take 1 tablet (10 mg total) by mouth daily. 30 tablet 11  . sacubitril-valsartan (ENTRESTO) 49-51 MG Take 1 tablet by mouth 2 (two) times daily. 60 tablet 11  . spironolactone (ALDACTONE) 25 MG tablet Take 1 tablet (25 mg total) by mouth daily. 90 tablet 3  No current facility-administered medications for this encounter.   BP 104/70   Pulse 90   Wt 78.7 kg (173 lb 6.4 oz)   SpO2 97%   BMI 27.16 kg/m  General: NAD Neck: No JVD, no thyromegaly or thyroid nodule.  Lungs: Clear to auscultation bilaterally with normal respiratory effort. CV: Nondisplaced PMI.  Heart regular S1/S2, no S3/S4, no murmur.  No peripheral edema.  No carotid bruit.  Normal pedal pulses.  Abdomen: Soft, nontender, no hepatosplenomegaly, no distention.  Skin: Intact without lesions or rashes.  Neurologic: Alert and oriented x 3.  Psych: Normal affect. Extremities: No clubbing or cyanosis.  HEENT: Normal.    Assessment/Plan: 1. Chronic systolic CHF: Echo in 9/21 showed severe asymmetric septal hypertrophy, no SAM or LVOT obstruction, EF 25-30%, mildly decreased RV systolic function, PASP 60 mmHg, severe LAE, severe MR with restricted posterior leaflet.  EF was lower than in the past (normal in 4/18) and mitral regurgitation was worse.  LHC/RHC in 12/21 showed 90% stenosis small D1 and 90% stenosis small OM2 (CAD does not explain cardiomyopathy) and normal filling pressures/preserved cardiac output.  [employer] ICD.  Cardiomyopathy could be natural history of  HCM with worsening of function over time.  CPX in 2/22 showed moderate-severe functional limitation.  [employer] CRT-D upgrade in 4/22, today only pacing 78% of the time.  He is back in atrial flutter today.  NYHA class II, euvolemic on exam and by Heartlogic.   - Continue bisoprolol 5 mg bid.  - Continue Entresto 49/51 bid, do not think he has BP room to increase. BMET today.  - Continue dapagliflozin 10 mg daily.   - Continue digoxin, check level today.  - Continue spironolactone 25 mg daily.  - I do not think he needs Lasix.   - Low BiV pacing frequency today while he is in atrial flutter.  Discussed with Dr. Graciela Husbands, will attempt repeat DCCV.  2. Atrial fibrillation/flutter: Paroxysmal => persistent.  He had DCCV 01/03/20 but went back into atrial fibrillation despite Tikosyn use.  He is off ranolazine due to VT with combination of Tikosyn and ranolazine.  Atrial fibrillation ablation likely has a lower chance of success here due to the size of the left atrium (severely enlarged).  DCCV in 4/22 was successful but now back in atrial flutter.  BiV pacing percentage is down to 78% by device interrogation today.  Rate is controlled.  - Continue bisoprolol 5 mg bid for rate control.  - Continue apixaban 5 mg bid, has not missed doses.  - Discussed with Dr. Graciela Husbands, will re-attempt DCCV (discussed risks/benefits with patient today, he agrees to procedure).   If this cardioversion fails, will need to consider transition to amiodarone and stopping dofetilide.  Patient is understandably concerned about long-term amiodarone.  3. Mitral regurgitation: TEE in 12/21 with 3+ MR, Carpentier IIIb.  Stress echo in 4/22 showed moderate MR with stress.  He saw Dr. Cornelius Moras, MR not thought bad enough to warrant surgical repair. CRT upgrade may help limit MR in the long-term.  4. Hypertrophic cardiomyopathy: No SAM or LVOT obstruction, has asymmetric septal hypertrophy.  Gene+, brother with SCD at 46.   - Would like him  to bring [place] his copy of the genetic testing results just to make a record of it.  5. VT: In 9/21, suspect due to combination of ranolazone and Tikosyn with QT prolongation.  Now off ranolazine.  6. CAD: LHC (12/21) with 90% ostial small D1 and 90% ostial small  OM2.  CAD does not explain the extent of his cardiomyopathy.   - Continue Crestor, good lipids in 1/22.   - No ASA given Eliquis use.   Followup with APP in 1 month after repeat DCCV.  If this fails, will need to consider transition to amiodarone.   Donald Bond 07/15/2020

## 2020-07-15 NOTE — Patient Instructions (Signed)
Labs done today, your results will be available in MyChart, we will contact you for abnormal readings.  Your physician has recommended that you have a Cardioversion (DCCV). Electrical Cardioversion uses a jolt of electricity to your heart either through paddles or wired patches attached to your chest. This is a controlled, usually prescheduled, procedure. Defibrillation is done under light anesthesia in the hospital, and you usually go home the day of the procedure. This is done to get your heart back into a normal rhythm. You are not awake for the procedure. Please see the instruction sheet given to you today.  Your physician recommends that you schedule a follow-up appointment in: 1 month  If you have any questions or concerns before your next appointment please send [place] a message through [place] or call our office at [phone].    TO LEAVE A MESSAGE FOR THE NURSE SELECT OPTION 2, PLEASE LEAVE A MESSAGE INCLUDING: . YOUR NAME . DATE OF BIRTH . CALL BACK NUMBER . REASON FOR CALL**this is important as we prioritize the call backs  YOU WILL RECEIVE A CALL BACK THE SAME DAY AS LONG AS YOU CALL BEFORE 4:00 PM  At the [facility], you and your health needs are our priority. As part of our continuing mission to provide you with exceptional heart care, we have created designated Provider Care Teams. These Care Teams include your primary Cardiologist (physician) and Advanced Practice Providers (APPs- Physician Assistants and Nurse Practitioners) who all work together to provide you with the care you need, when you need it.   You may see any of the following providers on your designated Care Team at your next follow up: Marland Kitchen Dr Arvilla Meres . Dr Marca Ancona . Dr Thornell Mule . Tonye Becket, NP . Robbie Lis, PA . Shanda Bumps [place],NP . Karle Plumber, PharmD   Please be sure to bring in all your medications bottles to every appointment.     CARDIOVERSION  INSTRUCTIONS:  You are scheduled for a Cardioversion on Thursday June 2 with Dr. Shirlee Latch.  Please arrive at the [facility] (Main Entrance A) at [facility]: [address] [place], [place] [address] at 8 am.  DIET: Nothing to eat or drink after midnight except a sip of water with medications (see medication instructions below)  Medication Instructions: Hold JARDIANCE THUR 6/2 AM  Continue your anticoagulant: ELIQUIS, please do not miss any doses   You must have a responsible person to drive you home and stay in the waiting area during your procedure. Failure to do so could result in cancellation.  Bring your insurance cards.  *Special Note: Every effort is made to have your procedure done on time. Occasionally there are emergencies that occur at the hospital that may cause delays. Please be patient if a delay does occur.

## 2020-07-30 ENCOUNTER — Telehealth (HOSPITAL_COMMUNITY): Payer: Self-pay | Admitting: *Deleted

## 2020-07-30 NOTE — Telephone Encounter (Signed)
Pt called to reschedule DCCV. Pt said his wife couldn't get off work. Pt r/s to 6/10.

## 2020-08-08 ENCOUNTER — Telehealth (HOSPITAL_COMMUNITY): Payer: Self-pay | Admitting: *Deleted

## 2020-08-08 NOTE — Telephone Encounter (Signed)
Pt left vm to r/s DCCV. I called pt back to r/s no answer/voicemail box full

## 2020-08-09 ENCOUNTER — Encounter (HOSPITAL_COMMUNITY)
Admission: RE | Disposition: A | Discharge status: Home / Self Care | Payer: Self-pay | Source: Home / Self Care | Attending: Cardiology

## 2020-08-09 ENCOUNTER — Encounter (HOSPITAL_COMMUNITY): Payer: Self-pay | Admitting: Cardiology

## 2020-08-09 ENCOUNTER — Ambulatory Visit (HOSPITAL_COMMUNITY)
Admission: RE | Admit: 2020-08-09 | Discharge: 2020-08-09 | Disposition: A | Discharge status: Home / Self Care | Payer: BC Managed Care – PPO | Attending: Cardiology | Admitting: Cardiology

## 2020-08-09 ENCOUNTER — Ambulatory Visit (HOSPITAL_COMMUNITY): Payer: BC Managed Care – PPO | Admitting: Anesthesiology

## 2020-08-09 ENCOUNTER — Other Ambulatory Visit: Payer: Self-pay

## 2020-08-09 DIAGNOSIS — I421 Obstructive hypertrophic cardiomyopathy: Secondary | ICD-10-CM | POA: Diagnosis not present

## 2020-08-09 DIAGNOSIS — I484 Atypical atrial flutter: Secondary | ICD-10-CM | POA: Diagnosis not present

## 2020-08-09 DIAGNOSIS — I4892 Unspecified atrial flutter: Secondary | ICD-10-CM | POA: Insufficient documentation

## 2020-08-09 DIAGNOSIS — Z79899 Other long term (current) drug therapy: Secondary | ICD-10-CM | POA: Insufficient documentation

## 2020-08-09 DIAGNOSIS — I422 Other hypertrophic cardiomyopathy: Secondary | ICD-10-CM | POA: Diagnosis not present

## 2020-08-09 DIAGNOSIS — I447 Left bundle-branch block, unspecified: Secondary | ICD-10-CM | POA: Insufficient documentation

## 2020-08-09 DIAGNOSIS — Z7901 Long term (current) use of anticoagulants: Secondary | ICD-10-CM | POA: Insufficient documentation

## 2020-08-09 DIAGNOSIS — Z950 Presence of cardiac pacemaker: Secondary | ICD-10-CM | POA: Diagnosis not present

## 2020-08-09 DIAGNOSIS — I480 Paroxysmal atrial fibrillation: Secondary | ICD-10-CM | POA: Diagnosis not present

## 2020-08-09 DIAGNOSIS — I4891 Unspecified atrial fibrillation: Secondary | ICD-10-CM | POA: Diagnosis not present

## 2020-08-09 DIAGNOSIS — I340 Nonrheumatic mitral (valve) insufficiency: Secondary | ICD-10-CM | POA: Diagnosis not present

## 2020-08-09 DIAGNOSIS — I4819 Other persistent atrial fibrillation: Secondary | ICD-10-CM | POA: Diagnosis not present

## 2020-08-09 DIAGNOSIS — Z8249 Family history of ischemic heart disease and other diseases of the circulatory system: Secondary | ICD-10-CM | POA: Insufficient documentation

## 2020-08-09 DIAGNOSIS — I2510 Atherosclerotic heart disease of native coronary artery without angina pectoris: Secondary | ICD-10-CM | POA: Insufficient documentation

## 2020-08-09 HISTORY — PX: CARDIOVERSION: SHX1299

## 2020-08-09 LAB — GLUCOSE, CAPILLARY: Glucose-Capillary: 81 mg/dL (ref 70–99)

## 2020-08-09 SURGERY — CARDIOVERSION
Anesthesia: General

## 2020-08-09 MED ORDER — LIDOCAINE 2% (20 MG/ML) 5 ML SYRINGE
INTRAMUSCULAR | Status: DC | PRN
  Administered 2020-08-09: 60 mg via INTRAVENOUS

## 2020-08-09 MED ORDER — ELIQUIS 5 MG PO TABS: 5.0000 mg | ORAL_TABLET | Freq: Two times a day (BID) | ORAL | 6 refills | Status: DC

## 2020-08-09 MED ORDER — SODIUM CHLORIDE 0.9 % IV SOLN: INTRAVENOUS | Status: DC | PRN

## 2020-08-09 MED ORDER — PROPOFOL 10 MG/ML IV BOLUS
INTRAVENOUS | Status: DC | PRN
  Administered 2020-08-09: 60 mg via INTRAVENOUS

## 2020-08-09 NOTE — Anesthesia Preprocedure Evaluation (Addendum)
Anesthesia Evaluation  Patient identified by MRN, date of birth, ID band Patient awake    Reviewed: Allergy & Precautions, NPO status , Patient's Chart, lab work & pertinent test results  History of Anesthesia Complications Negative for: history of anesthetic complications  Airway Mallampati: I  TM Distance: >3 FB Neck ROM: Full    Dental   Pulmonary neg pulmonary ROS,    Pulmonary exam normal        Cardiovascular hypertension, Pt. on home beta blockers and Pt. on medications +CHF  + dysrhythmias Atrial Fibrillation, Ventricular Tachycardia and Ventricular Fibrillation + Cardiac Defibrillator + Valvular Problems/Murmurs MR  Rhythm:Irregular  Hypertrophic cardiomyopathy w/o LVOT obstruction or SAM   Neuro/Psych negative neurological ROS     GI/Hepatic negative GI ROS, Neg liver ROS,   Endo/Other  negative endocrine ROS  Renal/GU negative Renal ROS  negative genitourinary   Musculoskeletal negative musculoskeletal ROS (+)   Abdominal   Peds  Hematology Eliquis   Anesthesia Other Findings  RHC/LHC in 12/21 showing 90% stenosis in small ostial D1 and small ostial OM2, no interventional target.  Cardiac output was preserved with normal filling pressures.  TEE showed EF 30-35% with 3+ mitral regurgitation  Echo (9/21): Severe asymmetric septal hypertrophy, no SAM or LVOT obstruction, EF 25-30%, mildly decreased RV systolic function, PASP 60 mmHg, severe LAE, severe MR with restricted posterior leaflet.  Reproductive/Obstetrics                            Anesthesia Physical Anesthesia Plan  ASA: 4  Anesthesia Plan: General   Post-op Pain Management:    Induction: Intravenous  PONV Risk Score and Plan: 2 and TIVA and Treatment may vary due to age or medical condition  Airway Management Planned: Mask  Additional Equipment: None  Intra-op Plan:   Post-operative Plan:   Informed  Consent: I have reviewed the patients History and Physical, chart, labs and discussed the procedure including the risks, benefits and alternatives for the proposed anesthesia with the patient or authorized representative who has indicated his/her understanding and acceptance.       Plan Discussed with:   Anesthesia Plan Comments:        Anesthesia Quick Evaluation

## 2020-08-09 NOTE — Interval H&P Note (Signed)
History and Physical Interval Note:  08/09/2020 8:40 AM  Donald Bond  has presented today for surgery, with the diagnosis of A-FIB.  The various methods of treatment have been discussed with the patient and family. After consideration of risks, benefits and other options for treatment, the patient has consented to  Procedure(s): CARDIOVERSION (N/A) as a surgical intervention.  The patient's history has been reviewed, patient examined, no change in status, stable for surgery.  I have reviewed the patient's chart and labs.  Questions were answered to the patient's satisfaction.     Jerick Khachatryan [employer]

## 2020-08-09 NOTE — Transfer of Care (Signed)
Immediate Anesthesia Transfer of Care Note  Patient: Donald Bond  Procedure(s) Performed: CARDIOVERSION  Patient Location: Endoscopy Unit  Anesthesia Type:General  Level of Consciousness: drowsy  Airway & Oxygen Therapy: Patient Spontanous Breathing  Post-op Assessment: Report given to RN and Post -op Vital signs reviewed and stable  Post vital signs: Reviewed and stable  Last Vitals:  Vitals Value Taken Time  BP    Temp    Pulse    Resp    SpO2      Last Pain:  Vitals:   08/09/20 0805  TempSrc: Oral  PainSc: 0-No pain         Complications: No notable events documented.

## 2020-08-09 NOTE — Anesthesia Procedure Notes (Addendum)
Procedure Name: General with mask airway Date/Time: 08/09/2020 8:44 AM Performed by: Elliot Dally, CRNA Pre-anesthesia Checklist: Patient identified, Emergency Drugs available, Suction available, Patient being monitored and Timeout performed Patient Re-evaluated:Patient Re-evaluated prior to induction Oxygen Delivery Method: Ambu bag Preoxygenation: Pre-oxygenation with 100% oxygen

## 2020-08-09 NOTE — Procedures (Addendum)
Electrical Cardioversion Procedure Note JOESIAH LONON [id number] 12-Jul-1960  Procedure: Electrical Cardioversion Indications:  Atrial Fibrillation  Procedure Details Consent: Risks of procedure as well as the alternatives and risks of each were explained to the (patient/caregiver).  Consent for procedure obtained. Time Out: Verified patient identification, verified procedure, site/side was marked, verified correct patient position, special equipment/implants available, medications/allergies/relevent history reviewed, required imaging and test results available.  Performed  Patient placed on cardiac monitor, pulse oximetry, supplemental oxygen as necessary.  Sedation given:  Propofol per anesthesiology Pacer pads placed anterior and posterior chest.  Cardioverted 1 time(s).  Cardioverted at 200J.  Evaluation Findings: Post procedure EKG shows: NSR Complications: None Patient did tolerate procedure well.   Marca Ancona 08/09/2020, 9:05 AM

## 2020-08-09 NOTE — Discharge Instructions (Signed)

## 2020-08-09 NOTE — Anesthesia Postprocedure Evaluation (Signed)
Anesthesia Post Note  Patient: Donald Bond  Procedure(s) Performed: CARDIOVERSION     Patient location during evaluation: Endoscopy Anesthesia Type: General Level of consciousness: awake and alert Pain management: pain level controlled Vital Signs Assessment: post-procedure vital signs reviewed and stable Respiratory status: spontaneous breathing, nonlabored ventilation and respiratory function stable Cardiovascular status: blood pressure returned to baseline and stable Postop Assessment: no apparent nausea or vomiting Anesthetic complications: no   No notable events documented.  Last Vitals:  Vitals:   08/09/20 0915 08/09/20 0925  BP: 121/82 122/80  Pulse: 80 80  Resp: 16 (!) 23  Temp:    SpO2: 98% 99%    Last Pain:  Vitals:   08/09/20 0925  TempSrc:   PainSc: 0-No pain                 [name],W. EDMOND

## 2020-08-15 NOTE — Progress Notes (Signed)
PCP: Medicine, [facility] EP: Dr. Graciela Husbands HF Cardiology: Dr. Shirlee Latch  60 y.o. with hypertrophic nonobstructive cardiomyopathy (gene+) was referred by Dr. Graciela Husbands for evaluation of newly noted systolic dysfunction.  Patient has HCM as evidenced by severe asymmetric septal hypertrophy.  He does not have LVOT obstruction or SAM.  His brother had SCD at 71, presumable due to HCM, and patient has a Environmental manager ICD.  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  Patient has struggled with atrial fibrillation.  He was seen by Dr. Johney Frame a couple of years ago and decided against AF ablation.  He is currently maintained on Tikosyn.  He was on ranolazone + Tikosyn but developed VT x 2 on this combination and ranolazine was stopped.  Most recently, had DCCV 01/03/20.  He is now back in atrial fibrillation.  He feels the atrial fibrillation when his rate gets fast (>100 bpm).  Today, rate is controlled in the 70s.  Echo in 4/18 showed EF 55-60%, but echo in 9/21 showed EF down to 25-30% with moderate-severe MR (no SAM).   Patient had RHC/LHC in 12/21 showing 90% stenosis in small ostial D1 and small ostial OM2, no interventional target.  Cardiac output was preserved with normal filling pressures.  TEE showed EF 30-35% with 3+ mitral regurgitation.   Patient saw Dr. Cornelius Moras for evaluation for MV repair, surgical Maze, epicardial LV lead.  Stress echo in 2/22 showed mild MR at rest and moderate MR at stress.  It was decided that MR was not severe enough to warrant surgery.   CPX in 2/22 showed moderate-severe HF functional limitation.   Patient had CRT upgrade in 4/22 then DCCV later in 4/22 to NSR.   Patient returns for followup of CHF.  He is back in atrial flutter today.  He actually feels good.  He has been working full time.  No significant exertional dyspnea.  No chest pain.  No lightheadedness.  No  orthopnea/PND.  When he saw Renee at EP clinic recently, he was BiV pacing 95% of the time.  However, today, he is BiV pacing 78% of the time per interrogation.   Today he returns for HF follow up. He was seen in clinic 5/22 and back in atrial flutter. Successful DCCV  week ago. Overall feeling fine. Denies increasing SOB, CP, dizziness, edema, or PND/Orthopnea. Appetite ok. No fever or chills. Weight at home 167-170 pounds. Taking all medications.   ECG (personally reviewed): atrial flutter with BiV pacing, 90 bpm, qtc 570 ms  [employer] ICD interrogation: unable to obtain reading today. Will do home transmission to see when he when back into AFL.  Labs (10/21): K 4.3, creatinine 0.75 Labs (11/21): K 5.1, creatinine 0.9 Labs (1/22): LDL 74, HDL 63, K 4.3, creatinine 1.61 Labs (4/22): K 4.7, creatinine 1.1 Labs (5/22): K 4.4, creatinine 0.93, hgb 16.5  PMH: 1. Atrial fibrillation: Paroxysmal.  Was on ranolazine + Tikosyn but had VT, ranolazine stopped.  Now on Tikosyn.  - DCCV 11/21. - s/p DCCV x 1 6/22. 2. Hypertrophic cardiomyopathy: No LVOT obstruction or SAM.  Gene+.  Brother with SCD at 27.  - [employer] ICD - Echo (4/18): Severe asymmetric septal hypertrophy, no SAM, moderate MR, EF 55-60%.  - Echo (9/21): Severe asymmetric septal hypertrophy, no SAM or LVOT obstruction, EF 25-30%, mildly decreased RV systolic function, PASP 60 mmHg, severe LAE, severe MR with restricted posterior  leaflet.  - TEE (12/21): severe asymmetric basal septal hypertrophy, EF 30-35%, mildly decreased RV systolic function, severe LAE, moderate TR, mild AI, restricted posterior MV leaflet with 2 MR jets, ERO 0.3 (suspect 3+ MR, Carpentier IIIb).  - CPX (2/22): RER 1.28, VE/VCO2 47, peak VO2 18.5.  Moderate-severe functional limitation due to HF, suspect severe elevation LA pressure/PA pressure with exercise.  - 4/22 upgrade to [facility] Scientific CRT-D.  3. Chronic systolic CHF: Echo (9/21): Severe  asymmetric septal hypertrophy, no SAM or LVOT obstruction, EF 25-30%, mildly decreased RV systolic function, PASP 60 mmHg, severe LAE, severe MR with restricted posterior leaflet.  - LHC/RHC (12/21): 90% stenosis in small ostial D1 and small ostial OM2, no interventional target. Mean RA 3, PA 38/14, mean PCWP 14 with v waves to 22, CI 2.66, PVR 2.1 WU.  4. VT: 9/21, suspected to be due to ranolazine + Tikosyn combination, ranolazine stopped.  5. H/o nephrolithiasis.  6. Mitral regurgitation: Moderate to severe by my review of 9/21 echo,  restriction of posterior leaflet.  - TEE (12/21): Restricted posterior MV leaflet with 2 MR jets, ERO 0.3 (suspect 3+ MR, Carpentier IIIb). - Stress echo (2/22): mild MR at rest, moderate MR with stress.  7. LBBB  FH: Mother with atrial fibrillation.  Brother with SCD at 19.  Son also with HCM gene+.   Social History   Socioeconomic History   Marital status: Married    Spouse name: Not on file   Number of children: Not on file   Years of education: Not on file   Highest education level: Not on file  Occupational History   Not on file  Tobacco Use   Smoking status: Never   Smokeless tobacco: Former    Types: Chew    Quit date: 1995  Vaping Use   Vaping Use: Never used  Substance and Sexual Activity   Alcohol use: Yes    Alcohol/week: 3.0 standard drinks    Types: 3 Cans of beer per week   Drug use: No   Sexual activity: Yes  Other Topics Concern   Not on file  Social History Narrative   Lives with spouse in [place]   employed   Social Determinants of Health   Financial Resource Strain: Not on file  Food Insecurity: Not on file  Transportation Needs: Not on file  Physical Activity: Not on file  Stress: Not on file  Social Connections: Not on file  Intimate Partner Violence: Not on file   ROS: All systems reviewed and negative except as per HPI.   Current Outpatient Medications  Medication Sig Dispense Refill   acetaminophen  (TYLENOL) 325 MG tablet Take 650 mg by mouth every 6 (six) hours as needed for mild pain or moderate pain (for pain.).     apixaban (ELIQUIS) 5 MG TABS tablet Take 1 tablet (5 mg total) by mouth in the morning and at bedtime. 60 tablet 6   bisoprolol (ZEBETA) 5 MG tablet TAKE 1 TABLET (5 MG TOTAL) BY MOUTH IN THE MORNING AND AT BEDTIME. 180 tablet 3   digoxin (LANOXIN) 0.125 MG tablet Take 1 tablet (0.125 mg total) by mouth daily. 90 tablet 3   dofetilide (TIKOSYN) 500 MCG capsule Take 1 capsule (500 mcg total) by mouth 2 (two) times daily. 60 capsule 12   empagliflozin (JARDIANCE) 10 MG TABS tablet Take 1 tablet (10 mg total) by mouth daily before breakfast. 90 tablet 3   loratadine (CLARITIN) 10 MG tablet Take 10 mg  by mouth daily as needed (seasonal allergies).     magnesium oxide (MAG-OX) 400 MG tablet TAKE 1 TABLET BY MOUTH EVERY DAY 30 tablet 11   rosuvastatin (CRESTOR) 10 MG tablet Take 1 tablet (10 mg total) by mouth daily. 30 tablet 11   sacubitril-valsartan (ENTRESTO) 49-51 MG Take 1 tablet by mouth 2 (two) times daily. 60 tablet 11   spironolactone (ALDACTONE) 25 MG tablet Take 1 tablet (25 mg total) by mouth daily. 90 tablet 3   No current facility-administered medications for this encounter.   Wt Readings from Last 3 Encounters:  08/16/20 78.1 kg (172 lb 3.2 oz)  08/09/20 75.8 kg (167 lb)  07/15/20 78.7 kg (173 lb 6.4 oz)    BP 100/60   Pulse 90   Wt 78.1 kg (172 lb 3.2 oz)   SpO2 96%   BMI 26.97 kg/m  General:  NAD. No resp difficulty HEENT: Normal Neck: Supple. No JVD. Carotids 2+ bilat; no bruits. No lymphadenopathy or thryomegaly appreciated. Cor: PMI nondisplaced. Regular rate & rhythm. No rubs, gallops or murmurs. Lungs: Clear Abdomen: Soft, nontender, nondistended. No hepatosplenomegaly. No bruits or masses. Good bowel sounds. Extremities: No cyanosis, clubbing, rash, edema Neuro: alert & oriented x 3, cranial nerves grossly intact. Moves all 4 extremities w/o  difficulty. Affect pleasant.   Assessment/Plan: 1. Chronic systolic CHF: Echo in 9/21 showed severe asymmetric septal hypertrophy, no SAM or LVOT obstruction, EF 25-30%, mildly decreased RV systolic function, PASP 60 mmHg, severe LAE, severe MR with restricted posterior leaflet.  EF was lower than in the past (normal in 4/18) and mitral regurgitation was worse.  LHC/RHC in 12/21 showed 90% stenosis small D1 and 90% stenosis small OM2 (CAD does not explain cardiomyopathy) and normal filling pressures/preserved cardiac output.  [employer] ICD.  Cardiomyopathy could be natural history of HCM with worsening of function over time.  CPX in 2/22 showed moderate-severe functional limitation.  [employer] CRT-D upgrade in 4/22, today only pacing 78% of the time.  DCCV 6/22. He is back in atrial flutter today.  NYHA class II, euvolemic on exam. Unable to obtain [employer]. - Continue bisoprolol 5 mg bid.  - Continue Entresto 49/51 bid, he has no BP room to increase. BMET today.  - Continue dapagliflozin 10 mg daily.   - Continue digoxin. Digoxin level today. - Continue spironolactone 25 mg daily.  - I do not think he needs Lasix.   2. Atrial fibrillation/flutter: Paroxysmal => persistent.  He had DCCV 01/03/20 but went back into atrial fibrillation despite Tikosyn use.  He is off ranolazine due to VT with combination of Tikosyn and ranolazine.  Atrial fibrillation ablation likely has a lower chance of success here due to the size of the left atrium (severely enlarged).  DCCV in 4/22 was successful but went back in atrial flutter.    Rate is controlled. DCCV 6/22. Back in AFL today. - Continue bisoprolol 5 mg bid for rate control.  - Continue apixaban 5 mg bid. No bleeding. - s/p DCCV 6/22.  Unclear when he went back into AFL. Will ask device rep to send home transmission. Needs to consider transition to amiodarone and stopping dofetilide.  Patient is understandably concerned about long-term  amiodarone. Would like to speak to Dr. Graciela Husbands about options at appt in a couple of weeks. 3. Mitral regurgitation: TEE in 12/21 with 3+ MR, Carpentier IIIb.  Stress echo in 4/22 showed moderate MR with stress.  He saw Dr. Cornelius Moras, MR not thought bad enough  to warrant surgical repair. CRT upgrade may help limit MR in the long-term.  4. Hypertrophic cardiomyopathy: No SAM or LVOT obstruction, has asymmetric septal hypertrophy.  Gene+, brother with SCD at 70.   - Would like him to bring [place] his copy of the genetic testing results just to make a record of it.  5. VT: In 9/21, suspect due to combination of ranolazone and Tikosyn with QT prolongation.  Now off ranolazine.  6. CAD: LHC (12/21) with 90% ostial small D1 and 90% ostial small OM2.  CAD does not explain the extent of his cardiomyopathy.   - Continue Crestor, good lipids in 1/22.  No CP. - No ASA given Eliquis use.   He would like to discuss with Dr. Graciela Husbands medical therapy for his AFL at his appt in a couple of weeks. He declines afib clinic re-referral.  Followup with Dr. Shirlee Latch in 3 months.    Donald Rome, FNP-BC 08/16/2020

## 2020-08-16 ENCOUNTER — Ambulatory Visit (HOSPITAL_COMMUNITY)
Admission: RE | Admit: 2020-08-16 | Discharge: 2020-08-16 | Disposition: A | Discharge status: Home / Self Care | Payer: BC Managed Care – PPO | Source: Ambulatory Visit | Attending: Family Medicine | Admitting: Family Medicine

## 2020-08-16 ENCOUNTER — Other Ambulatory Visit: Payer: Self-pay

## 2020-08-16 ENCOUNTER — Encounter (HOSPITAL_COMMUNITY): Payer: Self-pay

## 2020-08-16 VITALS — BP 100/60 | HR 90 | Wt 172.2 lb

## 2020-08-16 DIAGNOSIS — I5042 Chronic combined systolic (congestive) and diastolic (congestive) heart failure: Secondary | ICD-10-CM | POA: Diagnosis not present

## 2020-08-16 DIAGNOSIS — Z87891 Personal history of nicotine dependence: Secondary | ICD-10-CM | POA: Diagnosis not present

## 2020-08-16 DIAGNOSIS — I480 Paroxysmal atrial fibrillation: Secondary | ICD-10-CM | POA: Insufficient documentation

## 2020-08-16 DIAGNOSIS — I4819 Other persistent atrial fibrillation: Secondary | ICD-10-CM

## 2020-08-16 DIAGNOSIS — I4892 Unspecified atrial flutter: Secondary | ICD-10-CM | POA: Diagnosis not present

## 2020-08-16 DIAGNOSIS — Z8679 Personal history of other diseases of the circulatory system: Secondary | ICD-10-CM

## 2020-08-16 DIAGNOSIS — I340 Nonrheumatic mitral (valve) insufficiency: Secondary | ICD-10-CM | POA: Diagnosis not present

## 2020-08-16 DIAGNOSIS — I422 Other hypertrophic cardiomyopathy: Secondary | ICD-10-CM | POA: Insufficient documentation

## 2020-08-16 DIAGNOSIS — Z79899 Other long term (current) drug therapy: Secondary | ICD-10-CM | POA: Insufficient documentation

## 2020-08-16 DIAGNOSIS — Z7901 Long term (current) use of anticoagulants: Secondary | ICD-10-CM | POA: Diagnosis not present

## 2020-08-16 DIAGNOSIS — I2510 Atherosclerotic heart disease of native coronary artery without angina pectoris: Secondary | ICD-10-CM | POA: Diagnosis not present

## 2020-08-16 LAB — BASIC METABOLIC PANEL
Anion gap: 7 (ref 5–15)
BUN: 13 mg/dL (ref 6–20)
CO2: 26 mmol/L (ref 22–32)
Calcium: 9.7 mg/dL (ref 8.9–10.3)
Chloride: 105 mmol/L (ref 98–111)
Creatinine, Ser: 0.95 mg/dL (ref 0.61–1.24)
GFR, Estimated: >60 mL/min (ref >60)
Glucose, Bld: 86 mg/dL (ref 70–99)
Potassium: 4.5 mmol/L (ref 3.5–5.1)
Sodium: 138 mmol/L (ref 135–145)

## 2020-08-16 LAB — DIGOXIN LEVEL: Digoxin Level: 0.8 ng/mL (ref 0.8–2.0)

## 2020-08-16 NOTE — Patient Instructions (Signed)
It was great to see you today! No medication changes are needed at this time.  Labs today We will only contact you if something comes back abnormal or we need to make some changes. Otherwise no news is good news!  Your physician recommends that you schedule a follow-up appointment in: 3-4 months with Dr McLean  Do the following things EVERYDAY: Weigh yourself in the morning before breakfast. Write it down and keep it in a log. Take your medicines as prescribed Eat low salt foods--Limit salt (sodium) to 2000 mg per day.  Stay as active as you can everyday Limit all fluids for the day to less than 2 liters  At the [facility], you and your health needs are our priority. As part of our continuing mission to provide you with exceptional heart care, we have created designated Provider Care Teams. These Care Teams include your primary Cardiologist (physician) and Advanced Practice Providers (APPs- Physician Assistants and Nurse Practitioners) who all work together to provide you with the care you need, when you need it.   You may see any of the following providers on your designated Care Team at your next follow up: Dr Daniel Bensimhon Dr Dalton McLean Dr Brandon Winfrey Amy Clegg, NP Brittainy Simmons, PA Jessica Milford,NP Lauren Kemp, PharmD   Please be sure to bring in all your medications bottles to every appointment.    If you have any questions or concerns before your next appointment please send us a message through mychart or call our office at [phone].    TO LEAVE A MESSAGE FOR THE NURSE SELECT OPTION 2, PLEASE LEAVE A MESSAGE INCLUDING: YOUR NAME DATE OF BIRTH CALL BACK NUMBER REASON FOR CALL**this is important as we prioritize the call backs  YOU WILL RECEIVE A CALL BACK THE SAME DAY AS LONG AS YOU CALL BEFORE 4:00 PM   

## 2020-08-19 ENCOUNTER — Telehealth: Payer: Self-pay

## 2020-08-19 NOTE — Telephone Encounter (Signed)
Called to request manual transmission. Patient states he will send one tonight when he gets home. Patient is aware how to send. Advised patient to call with any further questions or concerns. Agreeable to plan.

## 2020-08-19 NOTE — Telephone Encounter (Signed)
-----   Message from Karie Soda, RN sent at 08/16/2020 10:04 AM EDT ----- Regarding: FW: ICD I am forwarding this message to device clinic.  Pt is not in ICM program.  Can you contact pt for download today or Monday and provide [facility] an update if patient is back in Afib?  He is a Geophysical data processor pt.   Thank you!     ----- Message ----- From: Jacklynn Ganong, FNP Sent: 08/16/2020   9:08 AM EDT To: Karie Soda, RN Subject: ICD                                            Cleone Slim Jacki Cones!  Can you do a transmission on Mr. Brink sometime in the next couple of days? Our equipment isn't working today and he is back in AFL, just trying to see when it happened as he is asymptomatic.  Thanks so much!

## 2020-08-26 NOTE — Telephone Encounter (Signed)
Transmission received 08/19/20 shows pt was in AF at the time.

## 2020-09-03 ENCOUNTER — Ambulatory Visit (INDEPENDENT_AMBULATORY_CARE_PROVIDER_SITE_OTHER): Payer: BC Managed Care – PPO

## 2020-09-03 ENCOUNTER — Encounter: Payer: Self-pay | Admitting: Internal Medicine

## 2020-09-03 ENCOUNTER — Ambulatory Visit: Payer: BC Managed Care – PPO | Admitting: Internal Medicine

## 2020-09-03 ENCOUNTER — Other Ambulatory Visit: Payer: Self-pay

## 2020-09-03 VITALS — BP 110/74 | HR 90 | Ht 67.0 in | Wt 181.8 lb

## 2020-09-03 DIAGNOSIS — Z95810 Presence of automatic (implantable) cardiac defibrillator: Secondary | ICD-10-CM

## 2020-09-03 DIAGNOSIS — I472 Ventricular tachycardia, unspecified: Secondary | ICD-10-CM

## 2020-09-03 DIAGNOSIS — I5042 Chronic combined systolic (congestive) and diastolic (congestive) heart failure: Secondary | ICD-10-CM

## 2020-09-03 DIAGNOSIS — I422 Other hypertrophic cardiomyopathy: Secondary | ICD-10-CM | POA: Diagnosis not present

## 2020-09-03 DIAGNOSIS — I429 Cardiomyopathy, unspecified: Secondary | ICD-10-CM

## 2020-09-03 DIAGNOSIS — I4819 Other persistent atrial fibrillation: Secondary | ICD-10-CM

## 2020-09-03 DIAGNOSIS — I4901 Ventricular fibrillation: Secondary | ICD-10-CM

## 2020-09-03 DIAGNOSIS — I484 Atypical atrial flutter: Secondary | ICD-10-CM

## 2020-09-03 LAB — CUP PACEART REMOTE DEVICE CHECK
Battery Remaining Longevity: 90 mo
Battery Remaining Percentage: 100 %
Brady Statistic RA Percent Paced: 0 %
Brady Statistic RV Percent Paced: 88 %
Date Time Interrogation Session: 20220705040200
HighPow Impedance: 45 Ohm
Implantable Lead Implant Date: 20000815
Implantable Lead Implant Date: 20220401
Implantable Lead Implant Date: 20220401
Implantable Lead Location: 753858
Implantable Lead Location: 753859
Implantable Lead Location: 753860
Implantable Lead Model: 144
Implantable Lead Model: 7841
Implantable Lead Serial Number: 1129476
Implantable Lead Serial Number: 334759
Implantable Pulse Generator Implant Date: 20220401
Lead Channel Impedance Value: 1192 Ohm
Lead Channel Impedance Value: 569 Ohm
Lead Channel Impedance Value: 768 Ohm
Lead Channel Pacing Threshold Amplitude: 1 V
Lead Channel Pacing Threshold Pulse Width: 0.4 ms
Lead Channel Setting Pacing Amplitude: 2.2 V
Lead Channel Setting Pacing Amplitude: 3.5 V
Lead Channel Setting Pacing Amplitude: 4 V
Lead Channel Setting Pacing Pulse Width: 0.4 ms
Lead Channel Setting Pacing Pulse Width: 0.5 ms
Lead Channel Setting Sensing Sensitivity: 0.5 mV
Lead Channel Setting Sensing Sensitivity: 1 mV
Pulse Gen Serial Number: 389080

## 2020-09-03 NOTE — Patient Instructions (Signed)

## 2020-09-03 NOTE — Progress Notes (Signed)
Patient ID: Donald Bond, male   DOB: 09-15-1960, 60 y.o.   MRN: [id number]       Patient Care Team: Medicine, [facility] as PCP - General (Family Medicine) Duke Salvia, MD as PCP - Electrophysiology (Cardiology)   HPI  Donald Bond is a 60 y.o. male Seen in followup for HCM associated with syncope and previously treated ventricular tachycardia;  He has had 3 previous devices.  Underwent gen change #4  2018 w AEGIS pouch support.   He has persistent afib; on apixoban .  He has had problems with recurrent atrial fibrillation undergoing multiple cardioversions, efforts to suppress with dofetilide.  Adjunctive ranolazine was proarrhythmic.  Declined by Dr. Fawn Kirk for catheter ablation Interval evaluation by Dr. DM and CO regarding mitral repair and maze.  MR variably assessed most recently only mild-moderate and surgery declined; recommendation for CRT upgrade accomplished 4/21  Most recently cardioverted 6/22 with rapid reversion to atrial fibrillation  Thromboembolic risk factors ( HCM, CHF-1 ) for a CHADSVASc Score of >=2  Family >> Gene Screened Son Donald Bond + ( in school) Donald Bond - ( struggling not in school)      Today,the patient denies chest pain, nocturnal dyspnea, orthopnea or peripheral edema.  There have been no lightheadedness or syncope.  Complains of palpitations, fatigue, and shortness of breath but this is better    His fatigue is doing better; he is less aware of his atrial fibrillation now than in the past  Understandably reluctant to consider amiodarone  Heart rates are considerably better since end of April.  DATE TEST EF %    4/18 Echo Normal  LAE (49/2.4/70)   9/21 Echo   25-30%  MR severe TR mod-sev (RVE)   12/21  TEE  30-35%            Date Cr  K Mg Hgb DIG  9/18 0.95 4.3   15.5   4/19  0.89 4.3 2.1 16.6   9/19  0.99 4.2 2.2     2/20 0.91 4.5   45.3 (Hct)    10/20 0.93 4.5 2.2     7/21 1.19 5.4 2.3 16.2    11/21 0.9 5.1 2.3  15.3   6/22 0.95 4.5  16.5 0.8/(2/22)0.6     Past Medical History:  Diagnosis Date   AICD (automatic cardioverter/defibrillator) present X 4   CHF (congestive heart failure) (HCC)    Chronic combined systolic and diastolic congestive heart failure (HCC)    Headache    "weekly" (06/15/2017)   History of kidney stones    Hypertr obst cardiomyop    ICD--[facility]    Dr. Graciela Husbands   Mitral regurgitation    Orthostatic lightheadedness    Persistent atrial fibrillation (HCC)    Severe mitral valve regurgitation by Echo 11/24/19 11/26/2019   SYNCOPE    TIREDNESS    Tricuspid regurgitation    Ventricular fibrillation/polymorphic ventricular tachycardia 06/04/2011   Ventricular tachycardia (HCC)    rx via ICD    Past Surgical History:  Procedure Laterality Date   BIV ICD GENERATOR CHANGEOUT N/A 05/31/2020   Procedure: BIV ICD GENERATOR UPGRADE;  Surgeon: Duke Salvia, MD;  Location: [facility] INVASIVE CV LAB;  Service: Cardiovascular;  Laterality: N/A;   CARDIAC CATHETERIZATION     CARDIAC DEFIBRILLATOR PLACEMENT  ~ 2000; ~ 2010   "I've had 4" (06/15/2017)   CARDIOVERSION  06/29/2016   Procedure: Cardioversion;  Surgeon: Duke Salvia, MD;  Location: [facility]  INVASIVE CV LAB;  Service: Cardiovascular;;   CARDIOVERSION N/A 06/17/2017   Procedure: CARDIOVERSION;  Surgeon: Jake Bathe, MD;  Location: [facility] ENDOSCOPY;  Service: Cardiovascular;  Laterality: N/A;   CARDIOVERSION N/A 07/11/2019   Procedure: CARDIOVERSION;  Surgeon: Chrystie Nose, MD;  Location: [facility] ENDOSCOPY;  Service: Cardiovascular;  Laterality: N/A;   CARDIOVERSION N/A 01/03/2020   Procedure: CARDIOVERSION;  Surgeon: Jake Bathe, MD;  Location: [facility] ENDOSCOPY;  Service: Cardiovascular;  Laterality: N/A;   CARDIOVERSION N/A 06/28/2020   Procedure: CARDIOVERSION;  Surgeon: Little Ishikawa, MD;  Location: [facility] ENDOSCOPY;  Service: Cardiovascular;  Laterality: N/A;   CARDIOVERSION N/A 08/09/2020   Procedure: CARDIOVERSION;  Surgeon: Laurey Morale, MD;  Location: [facility] ENDOSCOPY;  Service: Cardiovascular;  Laterality: N/A;   Guidant Vitality ICD Impantation--Guidant Vitality T135--11/13/2003     HYDROCELE EXCISION / REPAIR Left 04/2013   ICD GENERATOR CHANGEOUT N/A 06/29/2016   Procedure: ICD Generator Changeout;  Surgeon: Duke Salvia, MD;  Location: [facility] INVASIVE CV LAB;  Service: Cardiovascular;  Laterality: N/A;   LAPAROSCOPIC CHOLECYSTECTOMY  2003   LITHOTRIPSY  X 1   RIGHT/LEFT HEART CATH AND CORONARY ANGIOGRAPHY N/A 02/14/2020   Procedure: RIGHT/LEFT HEART CATH AND CORONARY ANGIOGRAPHY;  Surgeon: Laurey Morale, MD;  Location: [facility] INVASIVE CV LAB;  Service: Cardiovascular;  Laterality: N/A;   TEE WITHOUT CARDIOVERSION N/A 02/14/2020   Procedure: TRANSESOPHAGEAL ECHOCARDIOGRAM (TEE);  Surgeon: Laurey Morale, MD;  Location: [facility] ENDOSCOPY;  Service: Cardiovascular;  Laterality: N/A;    Current Outpatient Medications  Medication Sig Dispense Refill   acetaminophen (TYLENOL) 325 MG tablet Take 650 mg by mouth every 6 (six) hours as needed for mild pain or moderate pain (for pain.).     apixaban (ELIQUIS) 5 MG TABS tablet Take 1 tablet (5 mg total) by mouth in the morning and at bedtime. 60 tablet 6   bisoprolol (ZEBETA) 5 MG tablet TAKE 1 TABLET (5 MG TOTAL) BY MOUTH IN THE MORNING AND AT BEDTIME. 180 tablet 3   digoxin (LANOXIN) 0.125 MG tablet Take 1 tablet (0.125 mg total) by mouth daily. 90 tablet 3   dofetilide (TIKOSYN) 500 MCG capsule Take 1 capsule (500 mcg total) by mouth 2 (two) times daily. 60 capsule 12   empagliflozin (JARDIANCE) 10 MG TABS tablet Take 1 tablet (10 mg total) by mouth daily before breakfast. 90 tablet 3   loratadine (CLARITIN) 10 MG tablet Take 10 mg by mouth daily as needed (seasonal allergies).     magnesium oxide (MAG-OX) 400 MG tablet TAKE 1 TABLET BY MOUTH EVERY DAY 30 tablet 11   rosuvastatin (CRESTOR) 10 MG tablet Take 1 tablet (10 mg total) by mouth daily. 30 tablet 11    sacubitril-valsartan (ENTRESTO) 49-51 MG Take 1 tablet by mouth 2 (two) times daily. 60 tablet 11   spironolactone (ALDACTONE) 25 MG tablet Take 1 tablet (25 mg total) by mouth daily. 90 tablet 3   No current facility-administered medications for this visit.    No Known Allergies  Review of Systems negative except from HPI and PMH  Physical Exam: BP 110/74   Pulse 90   Ht 5\' 7"  (1.702 m)   Wt 181 lb 12.8 oz (82.5 kg)   SpO2 96%   BMI 28.47 kg/m  Well developed and well nourished in no acute distress HENT normal Neck supple with JVP-flat Lungs Clear Device pocket well healed; without hematoma or erythema.  There is no tethering Irregularly irregular rate and rhythm Abd-soft  with active BS No Clubbing cyanosis  No edema Skin-warm and clammy A & Oriented  Grossly normal sensory and motor function  ECG: Coarse atrial fibrillation with underlying ventricular pacing @ 90 upright QRS lead V1 and a QRS in lead I  Assessment and plan   Hypertrophic cardio myopathy with severe LV dysfunction EF 25%  MR-severe  Ventricular tachycardia with appropriate therapy   LBBB//1AVB   Atrial Fibrillation-persistent long-term-recurrent  Inappropriate therapy  PVCs  Sleep disordered breathing and daytime somnolence  ICD-CRT-[employer]  Grief   No interval ventricular tachycardia.  We will continue the dofetilide for now  Persistent atrial fibrillation.  This will be the next question as to whether to allow it to be permanent or to pursue further efforts at restoring sinus rhythm which would be amiodarone to which he is disinclined, ablation to which Fawn Kirk is disinclined or stand-alone maze to which CO is disinclined.  We discussed reaching out to Dr. Nolon Rod to help think about best treatment strategy with his very large left atrium variable mitral regurgitation now CRT-D and improved symptoms.  For now we will continue his dofetilide until we make a final decision regarding  neck steps for his atrial fibrillation  Continue Lanoxin given his left ventricular dysfunction.  Last level was appropriate  Continue Entresto 49/51 spironolactone 25 and Jardiance 10 and Zebeta 5  twice daily for his cardiomyopathy   I,Stephanie Williams,acting as a scribe for Sherryl Manges, MD.,have documented all relevant documentation on the behalf of Sherryl Manges, MD,as directed by  Sherryl Manges, MD while in the presence of Sherryl Manges, MD.  I, Sherryl Manges, MD, have reviewed all documentation for this visit. The documentation on 09/03/20 for the exam, diagnosis, procedures, and orders are all accurate and complete.

## 2020-09-24 DIAGNOSIS — I480 Paroxysmal atrial fibrillation: Secondary | ICD-10-CM | POA: Diagnosis not present

## 2020-09-24 DIAGNOSIS — Z7901 Long term (current) use of anticoagulants: Secondary | ICD-10-CM | POA: Diagnosis not present

## 2020-09-24 DIAGNOSIS — I10 Essential (primary) hypertension: Secondary | ICD-10-CM | POA: Diagnosis not present

## 2020-09-24 DIAGNOSIS — E782 Mixed hyperlipidemia: Secondary | ICD-10-CM | POA: Diagnosis not present

## 2020-09-24 NOTE — Progress Notes (Signed)
Remote ICD transmission.   

## 2020-10-21 ENCOUNTER — Telehealth: Payer: Self-pay | Admitting: Internal Medicine

## 2020-10-21 ENCOUNTER — Other Ambulatory Visit: Payer: Self-pay

## 2020-10-21 ENCOUNTER — Telehealth (HOSPITAL_COMMUNITY): Payer: Self-pay | Admitting: Pharmacist

## 2020-10-21 DIAGNOSIS — I472 Ventricular tachycardia, unspecified: Secondary | ICD-10-CM

## 2020-10-21 DIAGNOSIS — I4891 Unspecified atrial fibrillation: Secondary | ICD-10-CM

## 2020-10-21 DIAGNOSIS — G4730 Sleep apnea, unspecified: Secondary | ICD-10-CM

## 2020-10-21 DIAGNOSIS — I421 Obstructive hypertrophic cardiomyopathy: Secondary | ICD-10-CM

## 2020-10-21 DIAGNOSIS — Z95810 Presence of automatic (implantable) cardiac defibrillator: Secondary | ICD-10-CM

## 2020-10-21 MED ORDER — DOFETILIDE 500 MCG PO CAPS: 500.0000 ug | ORAL_CAPSULE | Freq: Two times a day (BID) | ORAL | 12 refills | Status: DC

## 2020-10-21 NOTE — Telephone Encounter (Signed)
*  STAT* If patient is at the pharmacy, call can be transferred to refill team.   1. Which medications need to be refilled? (please list name of each medication and dose if known) new prescription Dofetilide  2. Which pharmacy/location (including street and city if local pharmacy) is medication to be sent to? [facility],  3. Do they need a 30 day or 90 day supply? 30 days and refills

## 2020-10-21 NOTE — Telephone Encounter (Signed)
Patient Advocate Encounter   Received notification from [employer] that prior authorization for Donald Bond is required.   PA submitted on CoverMyMeds Key [id number] Status is pending   Will continue to follow.   Karle Plumber, PharmD, BCPS, BCCP, CPP Heart Failure Clinic Pharmacist [phone]

## 2020-10-22 ENCOUNTER — Telehealth: Payer: Self-pay

## 2020-10-22 NOTE — Telephone Encounter (Signed)
**Note De-Identified Daysha Ashmore Obfuscation** I started a Dofetilide PA through covermymeds. Key: [facility]

## 2020-10-23 NOTE — Telephone Encounter (Signed)
**Note De-Identified Julianne Chamberlin Obfuscation** Agapito Seeley Key: [facility] - PA Case ID: [id number] - Rx #: [id number] Need help? Call us at [phone] Outcome: Approved on August 23 Your PA request has been approved. Additional information will be provided in the approval communication.  Drug: Dofetilide capsules Form: Ambulance person PA Form (2017 NCPDP)  I have notified [facility] of this approval.

## 2020-10-23 NOTE — Telephone Encounter (Signed)
Advanced Heart Failure Patient Advocate Encounter  Prior Authorization for Sherryll Burger has been approved.    Effective dates: 10/22/20 through 10/23/23  Karle Plumber, PharmD, BCPS, BCCP, CPP Heart Failure Clinic Pharmacist [phone]

## 2020-10-24 ENCOUNTER — Other Ambulatory Visit (HOSPITAL_COMMUNITY): Payer: Self-pay

## 2020-11-19 ENCOUNTER — Other Ambulatory Visit: Payer: Self-pay

## 2020-11-19 ENCOUNTER — Encounter (HOSPITAL_COMMUNITY): Payer: Self-pay | Admitting: Cardiology

## 2020-11-19 ENCOUNTER — Ambulatory Visit (HOSPITAL_COMMUNITY)
Admission: RE | Admit: 2020-11-19 | Discharge: 2020-11-19 | Disposition: A | Discharge status: Home / Self Care | Payer: Self-pay | Source: Ambulatory Visit | Attending: Cardiology | Admitting: Cardiology

## 2020-11-19 VITALS — BP 100/60 | HR 90 | Wt 179.8 lb

## 2020-11-19 DIAGNOSIS — Z87891 Personal history of nicotine dependence: Secondary | ICD-10-CM | POA: Insufficient documentation

## 2020-11-19 DIAGNOSIS — I5042 Chronic combined systolic (congestive) and diastolic (congestive) heart failure: Secondary | ICD-10-CM | POA: Insufficient documentation

## 2020-11-19 DIAGNOSIS — I340 Nonrheumatic mitral (valve) insufficiency: Secondary | ICD-10-CM | POA: Insufficient documentation

## 2020-11-19 DIAGNOSIS — I4892 Unspecified atrial flutter: Secondary | ICD-10-CM | POA: Insufficient documentation

## 2020-11-19 DIAGNOSIS — Z09 Encounter for follow-up examination after completed treatment for conditions other than malignant neoplasm: Secondary | ICD-10-CM | POA: Insufficient documentation

## 2020-11-19 DIAGNOSIS — I422 Other hypertrophic cardiomyopathy: Secondary | ICD-10-CM | POA: Insufficient documentation

## 2020-11-19 DIAGNOSIS — I480 Paroxysmal atrial fibrillation: Secondary | ICD-10-CM | POA: Insufficient documentation

## 2020-11-19 DIAGNOSIS — I2510 Atherosclerotic heart disease of native coronary artery without angina pectoris: Secondary | ICD-10-CM | POA: Insufficient documentation

## 2020-11-19 DIAGNOSIS — Z7901 Long term (current) use of anticoagulants: Secondary | ICD-10-CM | POA: Insufficient documentation

## 2020-11-19 DIAGNOSIS — R0600 Dyspnea, unspecified: Secondary | ICD-10-CM | POA: Insufficient documentation

## 2020-11-19 DIAGNOSIS — I484 Atypical atrial flutter: Secondary | ICD-10-CM

## 2020-11-19 DIAGNOSIS — Z79899 Other long term (current) drug therapy: Secondary | ICD-10-CM | POA: Insufficient documentation

## 2020-11-19 LAB — CBC
HCT: 50.5 % (ref 39.0–52.0)
Hemoglobin: 17.3 g/dL — ABNORMAL HIGH (ref 13.0–17.0)
MCH: 31.6 pg (ref 26.0–34.0)
MCHC: 34.3 g/dL (ref 30.0–36.0)
MCV: 92.3 fL (ref 80.0–100.0)
Platelets: 161 10*3/uL (ref 150–400)
RBC: 5.47 MIL/uL (ref 4.22–5.81)
RDW: 12.3 % (ref 11.5–15.5)
WBC: 6 10*3/uL (ref 4.0–10.5)
nRBC: 0 % (ref 0.0–0.2)

## 2020-11-19 LAB — BASIC METABOLIC PANEL
Anion gap: 7 (ref 5–15)
BUN: 12 mg/dL (ref 6–20)
CO2: 24 mmol/L (ref 22–32)
Calcium: 10.2 mg/dL (ref 8.9–10.3)
Chloride: 106 mmol/L (ref 98–111)
Creatinine, Ser: 0.85 mg/dL (ref 0.61–1.24)
GFR, Estimated: >60 mL/min (ref >60)
Glucose, Bld: 104 mg/dL — ABNORMAL HIGH (ref 70–99)
Potassium: 4 mmol/L (ref 3.5–5.1)
Sodium: 137 mmol/L (ref 135–145)

## 2020-11-19 LAB — MAGNESIUM: Magnesium: 2.1 mg/dL (ref 1.7–2.4)

## 2020-11-19 LAB — DIGOXIN LEVEL: Digoxin Level: 0.5 ng/mL — ABNORMAL LOW (ref 0.8–2.0)

## 2020-11-19 NOTE — Progress Notes (Signed)
PCP: Medicine, [facility] EP: Dr. Graciela Husbands HF Cardiology: Dr. Shirlee Latch  60 y.o. with hypertrophic nonobstructive cardiomyopathy (gene+) was referred by Dr. Graciela Husbands for evaluation of newly noted systolic dysfunction.  Patient has HCM as evidenced by severe asymmetric septal hypertrophy.  He does not have LVOT obstruction or SAM.  His brother had SCD at 3, presumable due to HCM, and patient has a Environmental manager ICD.  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  Patient has struggled with atrial fibrillation.  He was seen by Dr. Johney Frame a couple of years ago and decided against AF ablation.  He is currently maintained on Tikosyn.  He was on ranolazone + Tikosyn but developed VT x 2 on this combination and ranolazine was stopped.  Most recently, had DCCV 01/03/20.  He is now back in atrial fibrillation.  He feels the atrial fibrillation when his rate gets fast (>100 bpm).  Today, rate is controlled in the 70s.  Echo in 4/18 showed EF 55-60%, but echo in 9/21 showed EF down to 25-30% with moderate-severe MR (no SAM).   Patient had RHC/LHC in 12/21 showing 90% stenosis in small ostial D1 and small ostial OM2, no interventional target.  Cardiac output was preserved with normal filling pressures.  TEE showed EF 30-35% with 3+ mitral regurgitation.   Patient saw Dr. Cornelius Moras for evaluation for MV repair, surgical Maze, epicardial LV lead.  Stress echo in 2/22 showed mild MR at rest and moderate MR at stress.  It was decided that MR was not severe enough to warrant surgery.   CPX in 2/22 showed moderate-severe HF functional limitation.   Patient had CRT upgrade in 4/22 then DCCV later in 4/22 to NSR. He had a recurrence of atrial flutter with DCCV in 6/22.  Cardioversion lasted less than a day before he was back in atrial flutter again.   Patient returns for followup of CHF.  He remains in atrial flutter. He is  still on dofetilide.  He has a new job, now working as an Chief Executive Officer.  He stays active, has dyspnea only with heavy exertion at this point.  Able to do his yardwork.  No chest pain.  No lightheadedness.  Device interrogation in 7/22 showed 88% BiV pacing.   ECG (personally reviewed): atrial flutter with v pacing, QTc difficult to discern due to flutter.   Labs (10/21): K 4.3, creatinine 0.75 Labs (11/21): K 5.1, creatinine 0.9 Labs (1/22): LDL 74, HDL 63, K 4.3, creatinine 4.40 Labs (4/22): K 4.7, creatinine 1.1 Labs (6/22): digoxin 0.8, K 4.5, creatinine 0.95  PMH: 1. Atrial fibrillation/flutter: Was on ranolazine + Tikosyn but had VT, ranolazine stopped.  Now on Tikosyn.  - DCCV 11/21. - DCCV 4/22 - DCCV 6/22: lasted < 1 day.  2. Hypertrophic cardiomyopathy: No LVOT obstruction or SAM.  Gene+.  Brother with SCD at 47.  - [employer] ICD - Echo (4/18): Severe asymmetric septal hypertrophy, no SAM, moderate MR, EF 55-60%.  - Echo (9/21): Severe asymmetric septal hypertrophy, no SAM or LVOT obstruction, EF 25-30%, mildly decreased RV systolic function, PASP 60 mmHg, severe LAE, severe MR with restricted posterior leaflet.  - TEE (12/21): severe asymmetric basal septal hypertrophy, EF 30-35%, mildly decreased RV systolic function, severe LAE, moderate TR, mild AI, restricted posterior MV leaflet with 2 MR jets, ERO 0.3 (suspect 3+ MR, Carpentier IIIb).  - CPX (2/22): RER 1.28, VE/VCO2 47,  peak VO2 18.5.  Moderate-severe functional limitation due to HF, suspect severe elevation LA pressure/PA pressure with exercise.  - 4/22 upgrade to [facility] [employer] CRT-D.  3. Chronic systolic CHF: Echo (9/21): Severe asymmetric septal hypertrophy, no SAM or LVOT obstruction, EF 25-30%, mildly decreased RV systolic function, PASP 60 mmHg, severe LAE, severe MR with restricted posterior leaflet.  - LHC/RHC (12/21): 90% stenosis in small ostial D1 and small ostial OM2, no interventional  target. Mean RA 3, PA 38/14, mean PCWP 14 with v waves to 22, CI 2.66, PVR 2.1 WU.  4. VT: 9/21, suspected to be due to ranolazine + Tikosyn combination, ranolazine stopped.  5. H/o nephrolithiasis.  6. Mitral regurgitation: Moderate to severe by my review of 9/21 echo,  restriction of posterior leaflet.  - TEE (12/21): Restricted posterior MV leaflet with 2 MR jets, ERO 0.3 (suspect 3+ MR, Carpentier IIIb). - Stress echo (2/22): mild MR at rest, moderate MR with stress.  7. LBBB  FH: Mother with atrial fibrillation.  Brother with SCD at 84.  Son also with HCM gene+.   Social History   Socioeconomic History   Marital status: Married    Spouse name: Not on file   Number of children: Not on file   Years of education: Not on file   Highest education level: Not on file  Occupational History   Not on file  Tobacco Use   Smoking status: Never   Smokeless tobacco: Former    Types: Chew    Quit date: 1995  Vaping Use   Vaping Use: Never used  Substance and Sexual Activity   Alcohol use: Yes    Alcohol/week: 3.0 standard drinks    Types: 3 Cans of beer per week   Drug use: No   Sexual activity: Yes  Other Topics Concern   Not on file  Social History Narrative   [place]   employed   Social Determinants of Health   Financial Resource Strain: Not on file  Food Insecurity: Not on file  Transportation Needs: Not on file  Physical Activity: Not on file  Stress: Not on file  Social Connections: Not on file  Intimate Partner Violence: Not on file   ROS: All systems reviewed and negative except as per HPI.   Current Outpatient Medications  Medication Sig Dispense Refill   acetaminophen (TYLENOL) 325 MG tablet Take 650 mg by mouth every 6 (six) hours as needed for mild pain or moderate pain (for pain.).     apixaban (ELIQUIS) 5 MG TABS tablet Take 1 tablet (5 mg total) by mouth in the morning and at bedtime. 60 tablet 6   bisoprolol (ZEBETA) 5 MG tablet  TAKE 1 TABLET (5 MG TOTAL) BY MOUTH IN THE MORNING AND AT BEDTIME. 180 tablet 3   digoxin (LANOXIN) 0.125 MG tablet Take 1 tablet (0.125 mg total) by mouth daily. 90 tablet 3   dofetilide (TIKOSYN) 500 MCG capsule Take 1 capsule (500 mcg total) by mouth 2 (two) times daily. 60 capsule 12   empagliflozin (JARDIANCE) 10 MG TABS tablet Take 1 tablet (10 mg total) by mouth daily before breakfast. 90 tablet 3   loratadine (CLARITIN) 10 MG tablet Take 10 mg by mouth daily as needed (seasonal allergies).     magnesium oxide (MAG-OX) 400 MG tablet TAKE 1 TABLET BY MOUTH EVERY DAY 30 tablet 11   rosuvastatin (CRESTOR) 10 MG tablet Take 1 tablet (10 mg total) by mouth daily. 30 tablet 11  sacubitril-valsartan (ENTRESTO) 49-51 MG Take 1 tablet by mouth 2 (two) times daily. 60 tablet 11   spironolactone (ALDACTONE) 25 MG tablet Take 1 tablet (25 mg total) by mouth daily. 90 tablet 3   No current facility-administered medications for this encounter.   BP 100/60   Pulse 90   Wt 81.6 kg (179 lb 12.8 oz)   SpO2 96%   BMI 28.16 kg/m  General: NAD Neck: No JVD, no thyromegaly or thyroid nodule.  Lungs: Clear to auscultation bilaterally with normal respiratory effort. CV: Nondisplaced PMI.  Heart irregular S1/S2, no S3/S4, 1/6 HSM apex.  No peripheral edema.  No carotid bruit.  Normal pedal pulses.  Abdomen: Soft, nontender, no hepatosplenomegaly, no distention.  Skin: Intact without lesions or rashes.  Neurologic: Alert and oriented x 3.  Psych: Normal affect. Extremities: No clubbing or cyanosis.  HEENT: Normal.    Assessment/Plan: 1. Chronic systolic CHF: Echo in 9/21 showed severe asymmetric septal hypertrophy, no SAM or LVOT obstruction, EF 25-30%, mildly decreased RV systolic function, PASP 60 mmHg, severe LAE, severe MR with restricted posterior leaflet.  EF was lower than in the past (normal in 4/18) and mitral regurgitation was worse.  LHC/RHC in 12/21 showed 90% stenosis small D1 and 90%  stenosis small OM2 (CAD does not explain cardiomyopathy) and normal filling pressures/preserved cardiac output.  [employer] ICD.  Cardiomyopathy could be natural history of HCM with worsening of function over time.  CPX in 2/22 showed moderate-severe functional limitation.  [employer] CRT-D upgrade in 4/22.  He is now in persistent atrial flutter, failed 6/22 DCCV.  He is BiV pacing consistently < 90% of the time.  NYHA class II, euvolemic on exam.   - Continue bisoprolol 5 mg bid.  - Continue Entresto 49/51 bid, do not think he has BP room to increase. BMET today.  - Continue dapagliflozin 10 mg daily.   - Continue digoxin, check level today.  - Continue spironolactone 25 mg daily.  - I do not think he needs Lasix.   - Low BiV pacing percentage while he is in atrial flutter (88% on most recent interrogation in 7/22). DCCV has been unsuccessful.  He is going to be evaluated at [facility] for consideration of AF/AFL ablation or surgical Maze.  If neither of these done, will need to discuss AV nodal ablation with BiV pacing with Dr. Graciela Husbands to obtain adequate BiV pacing in setting of chronic atrial flutter.  2. Atrial fibrillation/flutter: Paroxysmal => persistent.  He had DCCV 01/03/20 but went back into atrial fibrillation despite Tikosyn use.  He is off ranolazine due to VT with combination of Tikosyn and ranolazine.  Atrial fibrillation ablation likely has a lower chance of success here due to the size of the left atrium (severely enlarged).  DCCV in 4/22 was successful but flutter recurred. DCCV in 6/22 only held for about a day. He remains on Tikosyn but is in atrial flutter still.  BiV pacing percentage was 88% on most recent device check.  Rate is controlled.  - Continue bisoprolol 5 mg bid for rate control.  - Continue apixaban 5 mg bid.  - I do not think that repeat DCCV at this point would be worthwhile.  Would need some substrate modification in the left atrium to be successful.  Dr. Graciela Husbands  referred him to [facility] for consideration of AF/AFL ablation or surgical Maze (he has been turned down for both of these procedures at [facility]).  If neither of these done, will need  to discuss AV nodal ablation with BiV pacing with Dr. Graciela Husbands to obtain adequate BiV pacing in setting of chronic atrial flutter.  - Continue dofetilide until we determine plan for his atrial arrhythmias.  3. Mitral regurgitation: TEE in 12/21 with 3+ MR, Carpentier IIIb.  Stress echo in 4/22 showed moderate MR with stress.  He saw Dr. Cornelius Moras, MR not thought bad enough to warrant surgical repair. CRT upgrade may help limit MR in the long-term.  - I will arrange for echo to reassess mitral regurgitation.  4. Hypertrophic cardiomyopathy: No SAM or LVOT obstruction, has asymmetric septal hypertrophy.  Gene+, brother with SCD at 79.   - Would like him to bring [place] his copy of the genetic testing results just to make a record of it.  5. VT: In 9/21, suspect due to combination of ranolazone and Tikosyn with QT prolongation.  Now off ranolazine.  6. CAD: LHC (12/21) with 90% ostial small D1 and 90% ostial small OM2.  CAD does not explain the extent of his cardiomyopathy.  No chest pain.  - Continue Crestor, good lipids in 1/22.   - No ASA given Eliquis use.   Followup 3 months.   Marca Ancona 11/19/2020

## 2020-11-19 NOTE — Patient Instructions (Signed)
EKG done today.  Labs done today. We will contact you only if your labs are abnormal.  No medication changes were made. Please continue all current medications as prescribed.  Your physician recommends that you schedule a follow-up appointment soon for an echo and in 3 months with Dr. Shirlee Latch  Your physician has requested that you have an echocardiogram. Echocardiography is a painless test that uses sound waves to create images of your heart. It provides your doctor with information about the size and shape of your heart and how well your heart's chambers and valves are working. This procedure takes approximately one hour. There are no restrictions for this procedure.  If you have any questions or concerns before your next appointment please send [place] a message through [place] or call our office at [phone].    TO LEAVE A MESSAGE FOR THE NURSE SELECT OPTION 2, PLEASE LEAVE A MESSAGE INCLUDING: YOUR NAME DATE OF BIRTH CALL BACK NUMBER REASON FOR CALL**this is important as we prioritize the call backs  YOU WILL RECEIVE A CALL BACK THE SAME DAY AS LONG AS YOU CALL BEFORE 4:00 PM   Do the following things EVERYDAY: Weigh yourself in the morning before breakfast. Write it down and keep it in a log. Take your medicines as prescribed Eat low salt foods--Limit salt (sodium) to 2000 mg per day.  Stay as active as you can everyday Limit all fluids for the day to less than 2 liters   At the [facility], you and your health needs are our priority. As part of our continuing mission to provide you with exceptional heart care, we have created designated Provider Care Teams. These Care Teams include your primary Cardiologist (physician) and Advanced Practice Providers (APPs- Physician Assistants and Nurse Practitioners) who all work together to provide you with the care you need, when you need it.   You may see any of the following providers on your designated Care Team at your next  follow up: Dr Arvilla Meres Dr Carron Curie, NP Robbie Lis, [place] Karle Plumber, PharmD   Please be sure to bring in all your medications bottles to every appointment.

## 2020-12-03 ENCOUNTER — Ambulatory Visit: Payer: BC Managed Care – PPO

## 2020-12-04 ENCOUNTER — Other Ambulatory Visit: Payer: Self-pay

## 2020-12-04 ENCOUNTER — Ambulatory Visit (HOSPITAL_COMMUNITY)
Admission: RE | Admit: 2020-12-04 | Discharge: 2020-12-04 | Disposition: A | Discharge status: Home / Self Care | Payer: BC Managed Care – PPO | Source: Ambulatory Visit | Attending: Cardiology | Admitting: Cardiology

## 2020-12-04 DIAGNOSIS — I4891 Unspecified atrial fibrillation: Secondary | ICD-10-CM | POA: Diagnosis not present

## 2020-12-04 DIAGNOSIS — I421 Obstructive hypertrophic cardiomyopathy: Secondary | ICD-10-CM | POA: Insufficient documentation

## 2020-12-04 DIAGNOSIS — I472 Ventricular tachycardia, unspecified: Secondary | ICD-10-CM | POA: Insufficient documentation

## 2020-12-04 DIAGNOSIS — I5042 Chronic combined systolic (congestive) and diastolic (congestive) heart failure: Secondary | ICD-10-CM | POA: Insufficient documentation

## 2020-12-04 DIAGNOSIS — I080 Rheumatic disorders of both mitral and aortic valves: Secondary | ICD-10-CM | POA: Diagnosis not present

## 2020-12-04 LAB — ECHOCARDIOGRAM COMPLETE
Area-P 1/2: 5.27 cm2
MV M vel: 4.55 m/s
MV Peak grad: 82.6 mmHg
Radius: 0.5 cm
S' Lateral: 2.95 cm

## 2020-12-04 NOTE — Progress Notes (Signed)
  Echocardiogram 2D Echocardiogram has been performed.  Leta Jungling M 12/04/2020, 8:39 AM

## 2020-12-28 ENCOUNTER — Other Ambulatory Visit: Payer: Self-pay | Admitting: Internal Medicine

## 2021-01-08 ENCOUNTER — Other Ambulatory Visit (HOSPITAL_COMMUNITY): Payer: Self-pay | Admitting: Cardiology

## 2021-01-18 ENCOUNTER — Other Ambulatory Visit (HOSPITAL_COMMUNITY): Payer: Self-pay | Admitting: Cardiology

## 2021-01-18 ENCOUNTER — Other Ambulatory Visit: Payer: Self-pay | Admitting: Internal Medicine

## 2021-01-26 LAB — CUP PACEART REMOTE DEVICE CHECK
Battery Remaining Longevity: 90 mo
Battery Remaining Longevity: 90 mo
Battery Remaining Percentage: 100 %
Battery Remaining Percentage: 100 %
Brady Statistic RA Percent Paced: 0 %
Brady Statistic RA Percent Paced: 0 %
Brady Statistic RV Percent Paced: 92 %
Brady Statistic RV Percent Paced: 92 %
Date Time Interrogation Session: 20221004040100
Date Time Interrogation Session: 20221004051900
HighPow Impedance: 45 Ohm
HighPow Impedance: 49 Ohm
Implantable Lead Implant Date: 20000815
Implantable Lead Implant Date: 20000815
Implantable Lead Implant Date: 20220401
Implantable Lead Implant Date: 20220401
Implantable Lead Implant Date: 20220401
Implantable Lead Implant Date: 20220401
Implantable Lead Location: 753858
Implantable Lead Location: 753858
Implantable Lead Location: 753859
Implantable Lead Location: 753859
Implantable Lead Location: 753860
Implantable Lead Location: 753860
Implantable Lead Model: 144
Implantable Lead Model: 144
Implantable Lead Model: 7841
Implantable Lead Model: 7841
Implantable Lead Serial Number: 1129476
Implantable Lead Serial Number: 1129476
Implantable Lead Serial Number: 334759
Implantable Lead Serial Number: 334759
Implantable Pulse Generator Implant Date: 20220401
Implantable Pulse Generator Implant Date: 20220401
Lead Channel Impedance Value: 1128 Ohm
Lead Channel Impedance Value: 1162 Ohm
Lead Channel Impedance Value: 591 Ohm
Lead Channel Impedance Value: 594 Ohm
Lead Channel Impedance Value: 774 Ohm
Lead Channel Impedance Value: 989 Ohm
Lead Channel Pacing Threshold Amplitude: 0.7 V
Lead Channel Pacing Threshold Amplitude: 1 V
Lead Channel Pacing Threshold Pulse Width: 0.4 ms
Lead Channel Pacing Threshold Pulse Width: 0.4 ms
Lead Channel Setting Pacing Amplitude: 2 V
Lead Channel Setting Pacing Amplitude: 2 V
Lead Channel Setting Pacing Amplitude: 3.5 V
Lead Channel Setting Pacing Amplitude: 3.5 V
Lead Channel Setting Pacing Amplitude: 3.5 V
Lead Channel Setting Pacing Amplitude: 3.5 V
Lead Channel Setting Pacing Pulse Width: 0.4 ms
Lead Channel Setting Pacing Pulse Width: 0.4 ms
Lead Channel Setting Pacing Pulse Width: 0.5 ms
Lead Channel Setting Pacing Pulse Width: 0.5 ms
Lead Channel Setting Sensing Sensitivity: 0.5 mV
Lead Channel Setting Sensing Sensitivity: 0.5 mV
Lead Channel Setting Sensing Sensitivity: 1 mV
Lead Channel Setting Sensing Sensitivity: 1 mV
Pulse Gen Serial Number: 389080
Pulse Gen Serial Number: 389080

## 2021-01-27 ENCOUNTER — Ambulatory Visit (INDEPENDENT_AMBULATORY_CARE_PROVIDER_SITE_OTHER): Payer: BC Managed Care – PPO

## 2021-01-27 DIAGNOSIS — I429 Cardiomyopathy, unspecified: Secondary | ICD-10-CM

## 2021-02-04 NOTE — Progress Notes (Signed)
Remote ICD transmission.   

## 2021-02-12 ENCOUNTER — Encounter (HOSPITAL_COMMUNITY): Payer: Self-pay | Admitting: Cardiology

## 2021-02-12 ENCOUNTER — Ambulatory Visit (HOSPITAL_COMMUNITY)
Admission: RE | Admit: 2021-02-12 | Discharge: 2021-02-12 | Disposition: A | Discharge status: Home / Self Care | Payer: BC Managed Care – PPO | Source: Ambulatory Visit | Attending: Cardiology | Admitting: Cardiology

## 2021-02-12 ENCOUNTER — Other Ambulatory Visit: Payer: Self-pay

## 2021-02-12 VITALS — BP 94/60 | HR 90 | Wt 185.6 lb

## 2021-02-12 DIAGNOSIS — I4892 Unspecified atrial flutter: Secondary | ICD-10-CM | POA: Diagnosis not present

## 2021-02-12 DIAGNOSIS — I2510 Atherosclerotic heart disease of native coronary artery without angina pectoris: Secondary | ICD-10-CM | POA: Insufficient documentation

## 2021-02-12 DIAGNOSIS — Z7901 Long term (current) use of anticoagulants: Secondary | ICD-10-CM | POA: Diagnosis not present

## 2021-02-12 DIAGNOSIS — Z87891 Personal history of nicotine dependence: Secondary | ICD-10-CM | POA: Diagnosis not present

## 2021-02-12 DIAGNOSIS — I422 Other hypertrophic cardiomyopathy: Secondary | ICD-10-CM | POA: Insufficient documentation

## 2021-02-12 DIAGNOSIS — I480 Paroxysmal atrial fibrillation: Secondary | ICD-10-CM | POA: Diagnosis not present

## 2021-02-12 DIAGNOSIS — I5042 Chronic combined systolic (congestive) and diastolic (congestive) heart failure: Secondary | ICD-10-CM | POA: Insufficient documentation

## 2021-02-12 DIAGNOSIS — Z79899 Other long term (current) drug therapy: Secondary | ICD-10-CM | POA: Diagnosis not present

## 2021-02-12 DIAGNOSIS — I484 Atypical atrial flutter: Secondary | ICD-10-CM

## 2021-02-12 DIAGNOSIS — I340 Nonrheumatic mitral (valve) insufficiency: Secondary | ICD-10-CM | POA: Insufficient documentation

## 2021-02-12 LAB — LIPID PANEL
Cholesterol: 144 mg/dL (ref 0–200)
HDL: 81 mg/dL (ref >40)
LDL Cholesterol: 53 mg/dL (ref 0–99)
Total CHOL/HDL Ratio: 1.8 RATIO
Triglycerides: 52 mg/dL (ref <150)
VLDL: 10 mg/dL (ref 0–40)

## 2021-02-12 LAB — BASIC METABOLIC PANEL
Anion gap: 6 (ref 5–15)
BUN: 11 mg/dL (ref 6–20)
CO2: 23 mmol/L (ref 22–32)
Calcium: 9.4 mg/dL (ref 8.9–10.3)
Chloride: 107 mmol/L (ref 98–111)
Creatinine, Ser: 0.88 mg/dL (ref 0.61–1.24)
GFR, Estimated: >60 mL/min (ref >60)
Glucose, Bld: 95 mg/dL (ref 70–99)
Potassium: 4.1 mmol/L (ref 3.5–5.1)
Sodium: 136 mmol/L (ref 135–145)

## 2021-02-12 LAB — DIGOXIN LEVEL: Digoxin Level: 0.7 ng/mL — ABNORMAL LOW (ref 0.8–2.0)

## 2021-02-12 NOTE — Progress Notes (Signed)
PCP: Medicine, [place] [facility] EP: Dr. Caryl Comes HF Cardiology: Dr. Aundra Dubin  60 y.o. with hypertrophic nonobstructive cardiomyopathy (gene+) was referred by Dr. Caryl Comes for evaluation of newly noted systolic dysfunction.  Patient has HCM as evidenced by severe asymmetric septal hypertrophy.  He does not have LVOT obstruction or SAM.  His brother had SCD at 25, presumable due to HCM, and patient has a [place].  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  Patient has struggled with atrial fibrillation.  He was seen by Dr. Rayann Heman a couple of years ago and decided against AF ablation.  He is currently maintained on Tikosyn.  He was on ranolazone + Tikosyn but developed VT x 2 on this combination and ranolazine was stopped.  Most recently, had DCCV 01/03/20.  He is now back in atrial fibrillation.  He feels the atrial fibrillation when his rate gets fast (>100 bpm).  Today, rate is controlled in the 70s.  Echo in 4/18 showed EF 55-60%, but echo in 9/21 showed EF down to 25-30% with moderate-severe MR (no SAM).   Patient had RHC/LHC in 12/21 showing 90% stenosis in small ostial D1 and small ostial OM2, no interventional target.  Cardiac output was preserved with normal filling pressures.  TEE showed EF 30-35% with 3+ mitral regurgitation.   Patient saw Dr. Roxy Manns for evaluation for MV repair, surgical Maze, epicardial LV lead.  Stress echo in 2/22 showed mild MR at rest and moderate MR at stress.  It was decided that MR was not severe enough to warrant surgery.   CPX in 2/22 showed moderate-severe HF functional limitation.   Patient had CRT upgrade in 4/22 then DCCV later in 4/22 to NSR. He had a recurrence of atrial flutter with DCCV in 6/22.  Cardioversion lasted less than a day before he was back in atrial flutter again. Patient was evaluated at [facility] for 2nd opinion on atrial flutter/fibrillation  ablation, but they also thought that he would be unlikely to hold NSR.   Echo was done in 10/22, showing EF 30-35% with severe focal basal septal hypertrophy, no LVOT obstruction, no SAM, moderate MR, mild RV enlargement with mildly decreased systolic function, severe LAE, and normal IVC.   Patient returns for followup of CHF.  He remains in atrial flutter. He is still on dofetilide.  He is working as an Pharmacologist.  He remains quite active, getting at least 8,000 steps/day.  No significant exertional dyspnea.  No chest pain.  No lightheadedness. No orthopnea/PND.     [employer] device interrogation: HeartLogic 2, 98% BiV paced  ECG (personally reviewed): atrial flutter with BiV pacing, QTc difficult to discern due to flutter.   Labs (10/21): K 4.3, creatinine 0.75 Labs (11/21): K 5.1, creatinine 0.9 Labs (1/22): LDL 74, HDL 63, K 4.3, creatinine 0.97 Labs (4/22): K 4.7, creatinine 1.1 Labs (6/22): digoxin 0.8, K 4.5, creatinine 0.95 Labs (9/22): digoxin 0.5, K 4, creatinine 0.85  PMH: 1. Atrial fibrillation/flutter: Was on ranolazine + Tikosyn but had VT, ranolazine stopped.  Now on Tikosyn.  - DCCV 11/21. - DCCV 4/22 - DCCV 6/22: lasted < 1 day.  2. Hypertrophic cardiomyopathy: No LVOT obstruction or SAM.  Gene+.  Brother with SCD at 21.  3. Chronic systolic CHF: Nonischemic cardiomyopathy, likely due to burned out HCM.  [employer] CRT-D device.  - Echo (4/18): Severe asymmetric septal hypertrophy, no SAM, moderate  MR, EF 55-60%.  - Echo (9/21): Severe asymmetric septal hypertrophy, no SAM or LVOT obstruction, EF 25-30%, mildly decreased RV systolic function, PASP 60 mmHg, severe LAE, severe MR with restricted posterior leaflet.  - TEE (12/21): severe asymmetric basal septal hypertrophy, EF 30-35%, mildly decreased RV systolic function, severe LAE, moderate TR, mild AI, restricted posterior MV leaflet with 2 MR jets, ERO 0.3 (suspect 3+ MR, Carpentier IIIb).   - LHC/RHC (12/21): 90% stenosis in small ostial D1 and small ostial OM2, no interventional target. Mean RA 3, PA 38/14, mean PCWP 14 with v waves to 22, CI 2.66, PVR 2.1 WU.  - CPX (2/22): RER 1.28, VE/VCO2 47, peak VO2 18.5.  Moderate-severe functional limitation due to HF, suspect severe elevation LA pressure/PA pressure with exercise.  - 4/22 upgrade to [place] CRT-D.  - Echo (10/22): EF 30-35% with severe focal basal septal hypertrophy, no LVOT obstruction, no SAM, moderate MR, mild RV enlargement with mildly decreased systolic function, severe LAE, and normal IVC.  4. VT: 9/21, suspected to be due to ranolazine + Tikosyn combination, ranolazine stopped.  5. H/o nephrolithiasis.  6. Mitral regurgitation: Moderate to severe by my review of 9/21 echo,  restriction of posterior leaflet.  - TEE (12/21): Restricted posterior MV leaflet with 2 MR jets, ERO 0.3 (suspect 3+ MR, Carpentier IIIb). - Stress echo (2/22): mild MR at rest, moderate MR with stress.  - Moderate on 10/22 echo.  7. LBBB  FH: Mother with atrial fibrillation.  Brother with SCD at 93.  Son also with HCM gene+.   Social History   Socioeconomic History   Marital status: Married    Spouse name: Not on file   Number of children: Not on file   Years of education: Not on file   Highest education level: Not on file  Occupational History   Not on file  Tobacco Use   Smoking status: Never   Smokeless tobacco: Former    Types: Chew    Quit date: 1995  Vaping Use   Vaping Use: Never used  Substance and Sexual Activity   Alcohol use: Yes    Alcohol/week: 3.0 standard drinks    Types: 3 Cans of beer per week   Drug use: No   Sexual activity: Yes  Other Topics Concern   Not on file  Social History Narrative   Lives with spouse in [place]   employed   Social Determinants of Health   Financial Resource Strain: Not on file  Food Insecurity: Not on file  Transportation Needs: Not on file  Physical  Activity: Not on file  Stress: Not on file  Social Connections: Not on file  Intimate Partner Violence: Not on file   ROS: All systems reviewed and negative except as per HPI.   Current Outpatient Medications  Medication Sig Dispense Refill   acetaminophen (TYLENOL) 325 MG tablet Take 650 mg by mouth every 6 (six) hours as needed for mild pain or moderate pain (for pain.).     apixaban (ELIQUIS) 5 MG TABS tablet Take 1 tablet (5 mg total) by mouth in the morning and at bedtime. 60 tablet 6   bisoprolol (ZEBETA) 5 MG tablet TAKE 1 TABLET (5 MG TOTAL) BY MOUTH IN THE MORNING AND AT BEDTIME. 180 tablet 2   digoxin (LANOXIN) 0.125 MG tablet TAKE 1 TABLET BY MOUTH DAILY 90 tablet 2   dofetilide (TIKOSYN) 500 MCG capsule Take 1 capsule (500 mcg total) by mouth 2 (two) times daily. [place]  capsule 12   JARDIANCE 10 MG TABS tablet TAKE 1 TABLET BY MOUTH DAILY BEFORE BREAKFAST. 90 tablet 3   loratadine (CLARITIN) 10 MG tablet Take 10 mg by mouth daily as needed (seasonal allergies).     magnesium oxide (MAG-OX) 400 MG tablet TAKE 1 TABLET BY MOUTH EVERY DAY 30 tablet 11   rosuvastatin (CRESTOR) 10 MG tablet TAKE 1 TABLET BY MOUTH EVERY DAY 90 tablet 3   sacubitril-valsartan (ENTRESTO) 49-51 MG Take 1 tablet by mouth 2 (two) times daily. 60 tablet 11   spironolactone (ALDACTONE) 25 MG tablet Take 1 tablet (25 mg total) by mouth daily. 90 tablet 3   No current facility-administered medications for this encounter.   BP 94/60    Pulse 90    Wt 84.2 kg (185 lb 9.6 oz)    SpO2 98%    BMI 29.07 kg/m  General: NAD Neck: No JVD, no thyromegaly or thyroid nodule.  Lungs: Clear to auscultation bilaterally with normal respiratory effort. CV: Nondisplaced PMI.  Heart regular S1/S2, no S3/S4, 1/6 HSM apex.  No peripheral edema.  No carotid bruit.  Normal pedal pulses.  Abdomen: Soft, nontender, no hepatosplenomegaly, no distention.  Skin: Intact without lesions or rashes.  Neurologic: Alert and oriented x 3.   Psych: Normal affect. Extremities: No clubbing or cyanosis.  HEENT: Normal.    Assessment/Plan: 1. Chronic systolic CHF: Echo in 9/21 showed severe asymmetric septal hypertrophy, no SAM or LVOT obstruction, EF 25-30%, mildly decreased RV systolic function, PASP 60 mmHg, severe LAE, severe MR with restricted posterior leaflet.  EF was lower than in the past (normal in 4/18) and mitral regurgitation was worse.  LHC/RHC in 12/21 showed 90% stenosis small D1 and 90% stenosis small OM2 (CAD does not explain cardiomyopathy) and normal filling pressures/preserved cardiac output.  Cardiomyopathy could be natural history of HCM with worsening of function over time.  CPX in 2/22 showed moderate-severe functional limitation.  [employer] CRT-D upgrade in 4/22.  He is now in persistent atrial flutter, failed 6/22 DCCV.  Last echo in 10/22 with EF 30-35%, moderate MR, no SAM or LVOT gradient. He is now actually BiV pacing more, 98% on today's interrogation. This is a significant improvement.  NYHA class II symptoms, not volume overloaded on exam.   - Continue bisoprolol 5 mg bid.  - Continue Entresto 49/51 bid, do not think he has BP room to increase. BMET today.  - Continue dapagliflozin 10 mg daily.   - Continue digoxin, check level today.  - Continue spironolactone 25 mg daily.  - I do not think he needs Lasix.   - AV nodal ablation would be possible option if BiV pacing percentage was low due to AF as it had been in the past, but with BiV pacing percentage up to 98% on today's interrogation, looks like we can avoid.  2. Atrial fibrillation/flutter: Paroxysmal => persistent.  He had DCCV 01/03/20 but went back into atrial fibrillation despite Tikosyn use.  He is off ranolazine due to VT with combination of Tikosyn and ranolazine.  Atrial fibrillation ablation likely has a lower chance of success here due to the size of the left atrium (severely enlarged).  DCCV in 4/22 was successful but flutter recurred.  DCCV in 6/22 only held for about a day. He remains on Tikosyn but is in atrial flutter still.  He was seen at [facility] for 2nd opinion, did not think ablation would be successful.   - Continue bisoprolol 5 mg bid for  rate control.  - Continue apixaban 5 mg bid.  - I do not think that repeat DCCV at this point would be worthwhile.   - See discussion above about AV nodal ablation, think we can hold off on this for now.  - With chronic AF, think we can stop Tikosyn.  Will confirm this with Dr. Caryl Comes.  3. Mitral regurgitation: TEE in 12/21 with 3+ MR, Carpentier IIIb.  Stress echo in 4/22 showed moderate MR with stress.  He saw Dr. Roxy Manns, MR not thought bad enough to warrant surgical repair. CRT upgrade may help limit MR in the long-term. Moderate MR on 10/22 echo.  4. Hypertrophic cardiomyopathy: No SAM or LVOT obstruction, has asymmetric septal hypertrophy.  Gene+, brother with SCD at 72.   5. VT: In 9/21, suspect due to combination of ranolazone and Tikosyn with QT prolongation.  Now off ranolazine.  6. CAD: LHC (12/21) with 90% ostial small D1 and 90% ostial small OM2.  CAD does not explain the extent of his cardiomyopathy.  No chest pain.  - Continue Crestor, check lipids today.   - No ASA given Eliquis use.   Followup 3 months.   Donald Bond 02/12/2021

## 2021-02-12 NOTE — Patient Instructions (Signed)
Labs done today, your results will be available in MyChart, we will contact you for abnormal readings.  Your physician recommends that you schedule a follow-up appointment in: 3 months  If you have any questions or concerns before your next appointment please send us a message through mychart or call our office at [phone].    TO LEAVE A MESSAGE FOR THE NURSE SELECT OPTION 2, PLEASE LEAVE A MESSAGE INCLUDING: YOUR NAME DATE OF BIRTH CALL BACK NUMBER REASON FOR CALL**this is important as we prioritize the call backs  YOU WILL RECEIVE A CALL BACK THE SAME DAY AS LONG AS YOU CALL BEFORE 4:00 PM  At the [facility], you and your health needs are our priority. As part of our continuing mission to provide you with exceptional heart care, we have created designated Provider Care Teams. These Care Teams include your primary Cardiologist (physician) and Advanced Practice Providers (APPs- Physician Assistants and Nurse Practitioners) who all work together to provide you with the care you need, when you need it.   You may see any of the following providers on your designated Care Team at your next follow up: Dr Daniel Bensimhon Dr Dalton McLean Amy Clegg, NP Brittainy Simmons, PA Jessica Milford,NP Lindsay Finch, PA Lauren Kemp, PharmD   Please be sure to bring in all your medications bottles to every appointment.    

## 2021-02-17 ENCOUNTER — Other Ambulatory Visit (HOSPITAL_COMMUNITY): Payer: Self-pay | Admitting: Cardiology

## 2021-02-27 ENCOUNTER — Other Ambulatory Visit: Payer: Self-pay

## 2021-03-02 ENCOUNTER — Other Ambulatory Visit (HOSPITAL_COMMUNITY): Payer: Self-pay | Admitting: Cardiology

## 2021-03-10 ENCOUNTER — Encounter: Payer: BC Managed Care – PPO | Admitting: Internal Medicine

## 2021-03-19 ENCOUNTER — Other Ambulatory Visit (HOSPITAL_COMMUNITY): Payer: Self-pay | Admitting: Cardiology

## 2021-04-17 DIAGNOSIS — I447 Left bundle-branch block, unspecified: Secondary | ICD-10-CM | POA: Insufficient documentation

## 2021-04-21 ENCOUNTER — Other Ambulatory Visit: Payer: Self-pay

## 2021-04-21 ENCOUNTER — Ambulatory Visit (INDEPENDENT_AMBULATORY_CARE_PROVIDER_SITE_OTHER): Payer: No Typology Code available for payment source | Admitting: Internal Medicine

## 2021-04-21 ENCOUNTER — Encounter: Payer: Self-pay | Admitting: Internal Medicine

## 2021-04-21 VITALS — BP 100/74 | HR 90 | Ht 67.0 in | Wt 186.8 lb

## 2021-04-21 DIAGNOSIS — I4819 Other persistent atrial fibrillation: Secondary | ICD-10-CM

## 2021-04-21 DIAGNOSIS — Z95810 Presence of automatic (implantable) cardiac defibrillator: Secondary | ICD-10-CM

## 2021-04-21 DIAGNOSIS — I422 Other hypertrophic cardiomyopathy: Secondary | ICD-10-CM

## 2021-04-21 DIAGNOSIS — I447 Left bundle-branch block, unspecified: Secondary | ICD-10-CM | POA: Diagnosis not present

## 2021-04-21 DIAGNOSIS — I472 Ventricular tachycardia, unspecified: Secondary | ICD-10-CM

## 2021-04-21 NOTE — Progress Notes (Signed)
Patient ID: Donald Bond, male   DOB: 1960-04-01, 61 y.o.   MRN: [id number]       Patient Care Team: Medicine, [place] as PCP - General (Family Medicine) Deboraha Sprang, MD as PCP - Electrophysiology (Cardiology)   HPI  Donald Bond is a 61 y.o. male Seen in followup for HCM associated with syncope and previously treated ventricular tachycardia;  He has had 3 previous devices.  Underwent gen change #4  2018 w AEGIS pouch support.   He has persistent afib; on apixoban .  He has had problems with recurrent atrial fibrillation undergoing multiple cardioversions, efforts to suppress with dofetilide.  Adjunctive ranolazine was proarrhythmic.  Declined by Dr. Greggory Brandy for catheter ablation  Interval evaluation by Dr. DM and CO regarding mitral repair and maze.  MR variably assessed most recently only mild-moderate and surgery declined; recommendation for CRT upgrade accomplished 4/21  Most recently cardioverted 6/22 with rapid reversion to atrial fibrillation  Saw Dr. Derinda Sis in consultation at [facility] regarding his increasing atrial fibrillation burden in the setting of his end-stage cardiomyopathy.  3 issues were discussed, 1 worsening mitral regurgitation, 2-high burden of atrial fibrillation with reduced BiV pacing and 3 progressive LV dysfunction secondary atrial fibrillation that might warrant consideration for cardiac transplantation.  Overall doing relatively well.  Stable shortness of breath.  No chest pain.  No interval tachy palpitations or edema.   Thromboembolic risk factors ( HCM, CHF-1 ) for a CHADSVASc Score of >=2  Family >> Gene Screened Son Donald Bond + ( in school) Donald Bond - ( struggling not in school)       DATE TEST EF %    4/18 Echo Normal  LAE (49/2.4/70)   9/21 Echo   25-30%  MR severe TR mod-sev (RVE)   12/21  TEE  30-35%     10/22 Echo  30-35% MR moderate    Date Cr  K Mg Hgb DIG  9/18 0.95 4.3   15.5   4/19  0.89 4.3 2.1 16.6   9/19   0.99 4.2 2.2     2/20 0.91 4.5   45.3 (Hct)    10/20 0.93 4.5 2.2     7/21 1.19 5.4 2.3 16.2    11/21 0.9 5.1 2.3 15.3   6/22 0.95 4.5  16.5 0.8/(2/22)0.6     Past Medical History:  Diagnosis Date   AICD (automatic cardioverter/defibrillator) present X 4   CHF (congestive heart failure) (HCC)    Chronic combined systolic and diastolic congestive heart failure ([place])    Headache    "weekly" (06/15/2017)   History of kidney stones    Hypertr obst cardiomyop    ICD--[facility]    Dr. Caryl Comes   Mitral regurgitation    Orthostatic lightheadedness    Persistent atrial fibrillation ([place])    Severe mitral valve regurgitation by Echo 11/24/19 11/26/2019   SYNCOPE    TIREDNESS    Tricuspid regurgitation    Ventricular fibrillation/polymorphic ventricular tachycardia 06/04/2011   Ventricular tachycardia    rx via ICD    Past Surgical History:  Procedure Laterality Date   BIV ICD GENERATOR CHANGEOUT N/A 05/31/2020   Procedure: BIV ICD GENERATOR UPGRADE;  Surgeon: Deboraha Sprang, MD;  Location: [facility] CV LAB;  Service: Cardiovascular;  Laterality: N/A;   CARDIAC CATHETERIZATION     CARDIAC DEFIBRILLATOR PLACEMENT  ~ 2000; ~ 2010   "I've had 4" (06/15/2017)   CARDIOVERSION  06/29/2016   Procedure:  Cardioversion;  Surgeon: Duke Salvia, MD;  Location: [facility] INVASIVE CV LAB;  Service: Cardiovascular;;   CARDIOVERSION N/A 06/17/2017   Procedure: CARDIOVERSION;  Surgeon: Jake Bathe, MD;  Location: [facility] ENDOSCOPY;  Service: Cardiovascular;  Laterality: N/A;   CARDIOVERSION N/A 07/11/2019   Procedure: CARDIOVERSION;  Surgeon: Chrystie Nose, MD;  Location: [facility] ENDOSCOPY;  Service: Cardiovascular;  Laterality: N/A;   CARDIOVERSION N/A 01/03/2020   Procedure: CARDIOVERSION;  Surgeon: Jake Bathe, MD;  Location: [facility] ENDOSCOPY;  Service: Cardiovascular;  Laterality: N/A;   CARDIOVERSION N/A 06/28/2020   Procedure: CARDIOVERSION;  Surgeon: Little Ishikawa, MD;  Location: [facility] ENDOSCOPY;   Service: Cardiovascular;  Laterality: N/A;   CARDIOVERSION N/A 08/09/2020   Procedure: CARDIOVERSION;  Surgeon: Laurey Morale, MD;  Location: [facility] ENDOSCOPY;  Service: Cardiovascular;  Laterality: N/A;   Guidant Vitality ICD Impantation--Guidant Vitality T135--11/13/2003     HYDROCELE EXCISION / REPAIR Left 04/2013   ICD GENERATOR CHANGEOUT N/A 06/29/2016   Procedure: ICD Generator Changeout;  Surgeon: Duke Salvia, MD;  Location: [facility] INVASIVE CV LAB;  Service: Cardiovascular;  Laterality: N/A;   LAPAROSCOPIC CHOLECYSTECTOMY  2003   LITHOTRIPSY  X 1   RIGHT/LEFT HEART CATH AND CORONARY ANGIOGRAPHY N/A 02/14/2020   Procedure: RIGHT/LEFT HEART CATH AND CORONARY ANGIOGRAPHY;  Surgeon: Laurey Morale, MD;  Location: [facility] INVASIVE CV LAB;  Service: Cardiovascular;  Laterality: N/A;   TEE WITHOUT CARDIOVERSION N/A 02/14/2020   Procedure: TRANSESOPHAGEAL ECHOCARDIOGRAM (TEE);  Surgeon: Laurey Morale, MD;  Location: [facility] ENDOSCOPY;  Service: Cardiovascular;  Laterality: N/A;    Current Outpatient Medications  Medication Sig Dispense Refill   acetaminophen (TYLENOL) 325 MG tablet Take 650 mg by mouth every 6 (six) hours as needed for mild pain or moderate pain (for pain.).     bisoprolol (ZEBETA) 5 MG tablet TAKE 1 TABLET (5 MG TOTAL) BY MOUTH IN THE MORNING AND AT BEDTIME. 180 tablet 2   digoxin (LANOXIN) 0.125 MG tablet TAKE 1 TABLET BY MOUTH DAILY 90 tablet 2   dofetilide (TIKOSYN) 500 MCG capsule Take 1 capsule (500 mcg total) by mouth 2 (two) times daily. 60 capsule 12   ELIQUIS 5 MG TABS tablet TAKE 1 TABLET (5 MG TOTAL) BY MOUTH IN THE MORNING AND AT BEDTIME. 60 tablet 6   ENTRESTO 49-51 MG TAKE 1 TABLET BY MOUTH TWICE A DAY 60 tablet 11   JARDIANCE 10 MG TABS tablet TAKE 1 TABLET BY MOUTH DAILY BEFORE BREAKFAST. 90 tablet 3   loratadine (CLARITIN) 10 MG tablet Take 10 mg by mouth daily as needed (seasonal allergies).     magnesium oxide (MAG-OX) 400 MG tablet TAKE 1 TABLET BY MOUTH EVERY DAY  30 tablet 11   rosuvastatin (CRESTOR) 10 MG tablet TAKE 1 TABLET BY MOUTH EVERY DAY 90 tablet 3   spironolactone (ALDACTONE) 25 MG tablet TAKE 1 TABLET (25 MG TOTAL) BY MOUTH DAILY. 90 tablet 3   No current facility-administered medications for this visit.    No Known Allergies  Review of Systems negative except from HPI and PMH  Physical Exam: BP 100/74    Pulse 90    Ht 5\' 7"  (1.702 m)    Wt 186 lb 12.8 oz (84.7 kg)    SpO2 95%    BMI 29.26 kg/m  Well developed and well nourished in no acute distress HENT normal Neck supple with JVP-flat Lungs Clear Device pocket well healed; without hematoma or erythema.  There is no tethering Irregularly irregular rate  and rhythm Abd-soft with active BS No Clubbing cyanosis  No edema Skin-warm and clammy A & Oriented  Grossly normal sensory and motor function  ECG: Coarse atrial fibrillation at 90 with ventricular pacing with an upright QRS lead V1 and negative QRS lead I  Assessment and plan Hypertrophic cardio myopathy with severe LV dysfunction EF 25-35%  MR-severe  Ventricular tachycardia with appropriate therapy   LBBB//1AVB   Atrial Fibrillation-persistent long-term-recurrent  Inappropriate therapy  PVCs  Sleep disordered breathing and daytime somnolence  ICD-CRT-[employer]    Atrial fibrillation is now essentially permanent.  We will discuss with Dr. DM about discontinuing his dofetilide.  In light of his discussions with Dr. Derinda Sis would consider AV junction ablation so as to optimize his ventricular pacing as well as to minimize irregularity both of which have been shown to be associated with improved outcomes.  Perhaps also there would be some remodeling and improvement with his mitral regurgitation  It would also eliminate the possibility of inappropriate shocks from atrial fibrillation.  Discussed with Dr DM who was in favor of AV ablation; the patient would like to review tgis with Dr DM at visit  3/14

## 2021-04-21 NOTE — Patient Instructions (Signed)

## 2021-04-28 ENCOUNTER — Ambulatory Visit (INDEPENDENT_AMBULATORY_CARE_PROVIDER_SITE_OTHER): Payer: BC Managed Care – PPO

## 2021-04-28 DIAGNOSIS — I472 Ventricular tachycardia, unspecified: Secondary | ICD-10-CM

## 2021-04-28 LAB — CUP PACEART REMOTE DEVICE CHECK
Battery Remaining Longevity: 78 mo
Battery Remaining Percentage: 87 %
Brady Statistic RA Percent Paced: 0 %
Brady Statistic RV Percent Paced: 93 %
Date Time Interrogation Session: 20230227040100
HighPow Impedance: 46 Ohm
Implantable Lead Implant Date: 20000815
Implantable Lead Implant Date: 20220401
Implantable Lead Implant Date: 20220401
Implantable Lead Location: 753858
Implantable Lead Location: 753859
Implantable Lead Location: 753860
Implantable Lead Model: 144
Implantable Lead Model: 7841
Implantable Lead Serial Number: 1129476
Implantable Lead Serial Number: 334759
Implantable Pulse Generator Implant Date: 20220401
Lead Channel Impedance Value: 1177 Ohm
Lead Channel Impedance Value: 606 Ohm
Lead Channel Impedance Value: 769 Ohm
Lead Channel Pacing Threshold Amplitude: 0.8 V
Lead Channel Pacing Threshold Pulse Width: 0.4 ms
Lead Channel Setting Pacing Amplitude: 2 V
Lead Channel Setting Pacing Amplitude: 3 V
Lead Channel Setting Pacing Pulse Width: 0.4 ms
Lead Channel Setting Pacing Pulse Width: 0.5 ms
Lead Channel Setting Sensing Sensitivity: 0.5 mV
Lead Channel Setting Sensing Sensitivity: 1 mV
Pulse Gen Serial Number: 389080

## 2021-05-02 NOTE — Progress Notes (Signed)
Remote ICD transmission.   

## 2021-05-13 ENCOUNTER — Encounter (HOSPITAL_COMMUNITY): Payer: BC Managed Care – PPO | Admitting: Cardiology

## 2021-06-18 ENCOUNTER — Other Ambulatory Visit: Payer: Self-pay | Admitting: Cardiology

## 2021-07-12 ENCOUNTER — Other Ambulatory Visit: Payer: Self-pay | Admitting: Internal Medicine

## 2021-07-29 ENCOUNTER — Ambulatory Visit (INDEPENDENT_AMBULATORY_CARE_PROVIDER_SITE_OTHER): Payer: BC Managed Care – PPO

## 2021-07-29 DIAGNOSIS — I422 Other hypertrophic cardiomyopathy: Secondary | ICD-10-CM

## 2021-07-29 LAB — CUP PACEART REMOTE DEVICE CHECK
Battery Remaining Longevity: 78 mo
Battery Remaining Percentage: 91 %
Brady Statistic RA Percent Paced: 0 %
Brady Statistic RV Percent Paced: 93 %
Date Time Interrogation Session: 20230529040200
HighPow Impedance: 50 Ohm
Implantable Lead Implant Date: 20000815
Implantable Lead Implant Date: 20220401
Implantable Lead Implant Date: 20220401
Implantable Lead Location: 753858
Implantable Lead Location: 753859
Implantable Lead Location: 753860
Implantable Lead Model: 144
Implantable Lead Model: 7841
Implantable Lead Serial Number: 1129476
Implantable Lead Serial Number: 334759
Implantable Pulse Generator Implant Date: 20220401
Lead Channel Impedance Value: 1135 Ohm
Lead Channel Impedance Value: 611 Ohm
Lead Channel Impedance Value: 871 Ohm
Lead Channel Pacing Threshold Amplitude: 0.8 V
Lead Channel Pacing Threshold Pulse Width: 0.4 ms
Lead Channel Setting Pacing Amplitude: 2 V
Lead Channel Setting Pacing Amplitude: 3 V
Lead Channel Setting Pacing Pulse Width: 0.4 ms
Lead Channel Setting Pacing Pulse Width: 0.5 ms
Lead Channel Setting Sensing Sensitivity: 0.5 mV
Lead Channel Setting Sensing Sensitivity: 1 mV
Pulse Gen Serial Number: 389080

## 2021-08-01 ENCOUNTER — Ambulatory Visit (HOSPITAL_COMMUNITY)
Admission: RE | Admit: 2021-08-01 | Discharge: 2021-08-01 | Disposition: A | Discharge status: Home / Self Care | Payer: BC Managed Care – PPO | Source: Ambulatory Visit | Attending: Cardiology | Admitting: Cardiology

## 2021-08-01 ENCOUNTER — Other Ambulatory Visit (HOSPITAL_COMMUNITY): Payer: Self-pay

## 2021-08-01 VITALS — BP 148/92 | HR 85 | Ht 67.0 in | Wt 186.4 lb

## 2021-08-01 DIAGNOSIS — Z79899 Other long term (current) drug therapy: Secondary | ICD-10-CM | POA: Diagnosis not present

## 2021-08-01 DIAGNOSIS — E785 Hyperlipidemia, unspecified: Secondary | ICD-10-CM | POA: Diagnosis not present

## 2021-08-01 DIAGNOSIS — Z8249 Family history of ischemic heart disease and other diseases of the circulatory system: Secondary | ICD-10-CM | POA: Diagnosis not present

## 2021-08-01 DIAGNOSIS — I4892 Unspecified atrial flutter: Secondary | ICD-10-CM | POA: Insufficient documentation

## 2021-08-01 DIAGNOSIS — I422 Other hypertrophic cardiomyopathy: Secondary | ICD-10-CM | POA: Insufficient documentation

## 2021-08-01 DIAGNOSIS — Z7901 Long term (current) use of anticoagulants: Secondary | ICD-10-CM | POA: Insufficient documentation

## 2021-08-01 DIAGNOSIS — Z87891 Personal history of nicotine dependence: Secondary | ICD-10-CM | POA: Insufficient documentation

## 2021-08-01 DIAGNOSIS — I4819 Other persistent atrial fibrillation: Secondary | ICD-10-CM | POA: Diagnosis not present

## 2021-08-01 DIAGNOSIS — I2510 Atherosclerotic heart disease of native coronary artery without angina pectoris: Secondary | ICD-10-CM | POA: Insufficient documentation

## 2021-08-01 DIAGNOSIS — I5042 Chronic combined systolic (congestive) and diastolic (congestive) heart failure: Secondary | ICD-10-CM | POA: Diagnosis not present

## 2021-08-01 DIAGNOSIS — I340 Nonrheumatic mitral (valve) insufficiency: Secondary | ICD-10-CM | POA: Diagnosis not present

## 2021-08-01 LAB — BASIC METABOLIC PANEL
Anion gap: 6 (ref 5–15)
BUN: 13 mg/dL (ref 6–20)
CO2: 25 mmol/L (ref 22–32)
Calcium: 9.5 mg/dL (ref 8.9–10.3)
Chloride: 109 mmol/L (ref 98–111)
Creatinine, Ser: 1.12 mg/dL (ref 0.61–1.24)
GFR, Estimated: >60 mL/min (ref >60)
Glucose, Bld: 98 mg/dL (ref 70–99)
Potassium: 4 mmol/L (ref 3.5–5.1)
Sodium: 140 mmol/L (ref 135–145)

## 2021-08-01 LAB — DIGOXIN LEVEL: Digoxin Level: 0.6 ng/mL — ABNORMAL LOW (ref 0.8–2.0)

## 2021-08-01 NOTE — Patient Instructions (Signed)
Stop Tikosyn  Labs done today, your results will be available in MyChart, we will contact you for abnormal readings.  Your physician recommends that you schedule a follow-up appointment in: 3 months.  If you have any questions or concerns before your next appointment please send [place] a message through [place] or call our office at [phone].    TO LEAVE A MESSAGE FOR THE NURSE SELECT OPTION 2, PLEASE LEAVE A MESSAGE INCLUDING: YOUR NAME DATE OF BIRTH CALL BACK NUMBER REASON FOR CALL**this is important as we prioritize the call backs  YOU WILL RECEIVE A CALL BACK THE SAME DAY AS LONG AS YOU CALL BEFORE 4:00 PM  At the [facility], you and your health needs are our priority. As part of our continuing mission to provide you with exceptional heart care, we have created designated Provider Care Teams. These Care Teams include your primary Cardiologist (physician) and Advanced Practice Providers (APPs- Physician Assistants and Nurse Practitioners) who all work together to provide you with the care you need, when you need it.   You may see any of the following providers on your designated Care Team at your next follow up: Dr Arvilla Meres Dr Carron Curie, NP Robbie Lis, [place] [facility] [place], [place] Karle Plumber, PharmD   Please be sure to bring in all your medications bottles to every appointment.

## 2021-08-03 NOTE — Progress Notes (Signed)
PCP: Medicine, [facility] EP: Dr. Graciela Bond HF Cardiology: Dr. Shirlee Bond  61 y.o. with hypertrophic nonobstructive cardiomyopathy (gene+) was referred by Dr. Graciela Bond for evaluation of newly noted systolic dysfunction.  Patient has HCM as evidenced by severe asymmetric septal hypertrophy.  He does not have LVOT obstruction or SAM.  His brother had SCD at 27, presumable due to HCM, and patient has a Environmental manager ICD.  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  Patient has struggled with atrial fibrillation.  He was seen by Dr. Johney Bond a couple of years ago and decided against AF ablation.  He is currently maintained on Tikosyn.  He was on ranolazone + Tikosyn but developed VT x 2 on this combination and ranolazine was stopped.  Most recently, had DCCV 01/03/20.  He is now back in atrial fibrillation.  He feels the atrial fibrillation when his rate gets fast (>100 bpm).  Today, rate is controlled in the 70s.  Echo in 4/18 showed EF 55-60%, but echo in 9/21 showed EF down to 25-30% with moderate-severe MR (no SAM).   Patient had RHC/LHC in 12/21 showing 90% stenosis in small ostial D1 and small ostial OM2, no interventional target.  Cardiac output was preserved with normal filling pressures.  TEE showed EF 30-35% with 3+ mitral regurgitation.   Patient saw Dr. Cornelius Bond for evaluation for MV repair, surgical Maze, epicardial LV lead.  Stress echo in 2/22 showed mild MR at rest and moderate MR at stress.  It was decided that MR was not severe enough to warrant surgery.   CPX in 2/22 showed moderate-severe HF functional limitation.   Patient had CRT upgrade in 4/22 then DCCV later in 4/22 to NSR. He had a recurrence of atrial flutter with DCCV in 6/22.  Cardioversion lasted less than a day before he was back in atrial flutter again. Patient was evaluated at [facility] for 2nd opinion on atrial flutter/fibrillation  ablation, but they also thought that he would be unlikely to hold NSR.   Echo was done in 10/22, showing EF 30-35% with severe focal basal septal hypertrophy, no LVOT obstruction, no SAM, moderate MR, mild RV enlargement with mildly decreased systolic function, severe LAE, and normal IVC.   Patient returns for followup of CHF.  He remains in atrial fibrillation. He is still on dofetilide.  He is working as an Chief Executive Officer.  No significant exertional dyspnea with usual ADLs. No lightheadedness.  No chest pain.  BP elevated in the office today but runs 90s-110s range at home (unusually high today).   ECG (personally reviewed): atrial fibrillation with BiV pacing, QTc 529 msec.   Labs (10/21): K 4.3, creatinine 0.75 Labs (11/21): K 5.1, creatinine 0.9 Labs (1/22): LDL 74, HDL 63, K 4.3, creatinine 1.61 Labs (4/22): K 4.7, creatinine 1.1 Labs (6/22): digoxin 0.8, K 4.5, creatinine 0.95 Labs (9/22): digoxin 0.5, K 4, creatinine 0.85 Labs (12/22): LDL 53, digoxin 0.7, creatinine 0.88  PMH: 1. Atrial fibrillation/flutter: Was on ranolazine + Tikosyn but had VT, ranolazine stopped.  Now on Tikosyn.  - DCCV 11/21. - DCCV 4/22 - DCCV 6/22: lasted < 1 day.  2. Hypertrophic cardiomyopathy: No LVOT obstruction or SAM.  Gene+.  Brother with SCD at 43.  3. Chronic systolic CHF: Nonischemic cardiomyopathy, likely due to burned out HCM.  [employer] CRT-D device.  - Echo (4/18): Severe asymmetric septal hypertrophy, no SAM, moderate MR, EF  55-60%.  - Echo (9/21): Severe asymmetric septal hypertrophy, no SAM or LVOT obstruction, EF 25-30%, mildly decreased RV systolic function, PASP 60 mmHg, severe LAE, severe MR with restricted posterior leaflet.  - TEE (12/21): severe asymmetric basal septal hypertrophy, EF 30-35%, mildly decreased RV systolic function, severe LAE, moderate TR, mild AI, restricted posterior MV leaflet with 2 MR jets, ERO 0.3 (suspect 3+ MR, Carpentier IIIb).  - LHC/RHC  (12/21): 90% stenosis in small ostial D1 and small ostial OM2, no interventional target. Mean RA 3, PA 38/14, mean PCWP 14 with v waves to 22, CI 2.66, PVR 2.1 WU.  - CPX (2/22): RER 1.28, VE/VCO2 47, peak VO2 18.5.  Moderate-severe functional limitation due to HF, suspect severe elevation LA pressure/PA pressure with exercise.  - 4/22 upgrade to [facility] [employer] CRT-D.  - Echo (10/22): EF 30-35% with severe focal basal septal hypertrophy, no LVOT obstruction, no SAM, moderate MR, mild RV enlargement with mildly decreased systolic function, severe LAE, and normal IVC.  4. VT: 9/21, suspected to be due to ranolazine + Tikosyn combination, ranolazine stopped.  5. H/o nephrolithiasis.  6. Mitral regurgitation: Moderate to severe by my review of 9/21 echo,  restriction of posterior leaflet.  - TEE (12/21): Restricted posterior MV leaflet with 2 MR jets, ERO 0.3 (suspect 3+ MR, Carpentier IIIb). - Stress echo (2/22): mild MR at rest, moderate MR with stress.  - Moderate on 10/22 echo.  7. LBBB  FH: Mother with atrial fibrillation.  Brother with SCD at 34.  Son also with HCM gene+.   Social History   Socioeconomic History   Marital status: Married    Spouse name: Not on file   Number of children: Not on file   Years of education: Not on file   Highest education level: Not on file  Occupational History   Not on file  Tobacco Use   Smoking status: Never   Smokeless tobacco: Former    Types: Chew    Quit date: 1995  Vaping Use   Vaping Use: Never used  Substance and Sexual Activity   Alcohol use: Yes    Alcohol/week: 3.0 standard drinks    Types: 3 Cans of beer per week   Drug use: No   Sexual activity: Yes  Other Topics Concern   Not on file  Social History Narrative   Lives with spouse in [place]   employed   Social Determinants of Health   Financial Resource Strain: Not on file  Food Insecurity: Not on file  Transportation Needs: Not on file  Physical Activity: Not on  file  Stress: Not on file  Social Connections: Not on file  Intimate Partner Violence: Not on file   ROS: All systems reviewed and negative except as per HPI.   Current Outpatient Medications  Medication Sig Dispense Refill   acetaminophen (TYLENOL) 325 MG tablet Take 650 mg by mouth every 6 (six) hours as needed for mild pain or moderate pain (for pain.).     bisoprolol (ZEBETA) 5 MG tablet TAKE 1 TABLET (5 MG TOTAL) BY MOUTH IN THE MORNING AND AT BEDTIME. 180 tablet 2   digoxin (LANOXIN) 0.125 MG tablet TAKE 1 TABLET BY MOUTH EVERY DAY 90 tablet 3   ELIQUIS 5 MG TABS tablet TAKE 1 TABLET (5 MG TOTAL) BY MOUTH IN THE MORNING AND AT BEDTIME. 60 tablet 6   ENTRESTO 49-51 MG TAKE 1 TABLET BY MOUTH TWICE A DAY 60 tablet 11   loratadine (CLARITIN) 10 MG  tablet Take 10 mg by mouth daily as needed (seasonal allergies).     magnesium oxide (MAG-OX) 400 MG tablet TAKE 1 TABLET BY MOUTH EVERY DAY 30 tablet 11   rosuvastatin (CRESTOR) 10 MG tablet TAKE 1 TABLET BY MOUTH EVERY DAY 90 tablet 3   spironolactone (ALDACTONE) 25 MG tablet TAKE 1 TABLET (25 MG TOTAL) BY MOUTH DAILY. 90 tablet 3   JARDIANCE 10 MG TABS tablet TAKE 1 TABLET BY MOUTH DAILY BEFORE BREAKFAST. (Patient not taking: Reported on 08/01/2021) 90 tablet 3   No current facility-administered medications for this encounter.   BP (!) 148/92   Pulse 85   Ht 5\' 7"  (1.702 m)   Wt 84.6 kg (186 lb 6.4 oz)   SpO2 97%   BMI 29.19 kg/m  General: NAD Neck: No JVD, no thyromegaly or thyroid nodule.  Lungs: Clear to auscultation bilaterally with normal respiratory effort. CV: Nondisplaced PMI.  Heart regular S1/S2, no S3/S4, 1/6 SEM RUSB.  No peripheral edema.  No carotid bruit.  Normal pedal pulses.  Abdomen: Soft, nontender, no hepatosplenomegaly, no distention.  Skin: Intact without lesions or rashes.  Neurologic: Alert and oriented x 3.  Psych: Normal affect. Extremities: No clubbing or cyanosis.  HEENT: Normal.    Assessment/Plan: 1.  Chronic systolic CHF: Echo in 9/21 showed severe asymmetric septal hypertrophy, no SAM or LVOT obstruction, EF 25-30%, mildly decreased RV systolic function, PASP 60 mmHg, severe LAE, severe MR with restricted posterior leaflet.  EF was lower than in the past (normal in 4/18) and mitral regurgitation was worse.  LHC/RHC in 12/21 showed 90% stenosis small D1 and 90% stenosis small OM2 (CAD does not explain cardiomyopathy) and normal filling pressures/preserved cardiac output.  Cardiomyopathy could be natural history of HCM with worsening of function over time.  CPX in 2/22 showed moderate-severe functional limitation.  [employer] CRT-D upgrade in 4/22.  He is now in persistent atrial fibrillation/atypical flutter, failed 6/22 DCCV.  Last echo in 10/22 with EF 30-35%, moderate MR, no SAM or LVOT gradient. We were unable to interrogate his device today to assess BiV pacing percentage. NYHA class II symptoms, not volume overloaded on exam.   - Continue bisoprolol 5 mg bid.  - Continue Entresto 49/51 bid, do not think he has BP room to increase (SBP at home 90s-110s).  - Continue dapagliflozin 10 mg daily.   - Continue digoxin, check level today.  - Continue spironolactone 25 mg daily. BMET today.  - I do not think he needs Lasix.   - Dr. Graciela Bond has suggested AV nodal ablation given likely permanent AF/AFL.  I think this is reasonable to maximize BiV pacing.  Will review with Dr. Graciela Bond.  2. Atrial fibrillation/flutter: Paroxysmal => persistent.  He had DCCV 01/03/20 but went back into atrial fibrillation despite Tikosyn use.  He is off ranolazine due to VT with combination of Tikosyn and ranolazine.  Atrial fibrillation ablation likely has a lower chance of success here due to the size of the left atrium (severely enlarged).  DCCV in 4/22 was successful but flutter recurred. DCCV in 6/22 only held for about a day. He remains on Tikosyn but is in atrial fibrillation still.  He was seen at [facility] for 2nd  opinion, did not think ablation would be successful.   - Continue bisoprolol 5 mg bid for rate control.  - Continue apixaban 5 mg bid.  - I do not think that repeat DCCV at this point would be worthwhile => with permanent  AF/AFL, will stop Tikosyn.   - See discussion above about AV nodal ablation to maximize BiV pacing.  3. Mitral regurgitation: TEE in 12/21 with 3+ MR, Carpentier IIIb.  Stress echo in 4/22 showed moderate MR with stress.  He saw Dr. Cornelius Bond, MR not thought bad enough to warrant surgical repair. CRT upgrade may help limit MR in the long-term. Moderate MR on 10/22 echo.  4. Hypertrophic cardiomyopathy: No SAM or LVOT obstruction, has asymmetric septal hypertrophy.  Gene+, brother with SCD at 6.   5. VT: In 9/21, suspect due to combination of ranolazone and Tikosyn with QT prolongation.  Now off ranolazine.  6. CAD: LHC (12/21) with 90% ostial small D1 and 90% ostial small OM2.  CAD does not explain the extent of his cardiomyopathy.  No chest pain.  - Continue Crestor, good lipids in 12/22.   - No ASA given Eliquis use.   Followup in 3 months with APP.    Donald Bond 08/03/2021

## 2021-08-12 ENCOUNTER — Telehealth (HOSPITAL_COMMUNITY): Payer: Self-pay | Admitting: Pharmacy Technician

## 2021-08-12 ENCOUNTER — Other Ambulatory Visit (HOSPITAL_COMMUNITY): Payer: Self-pay

## 2021-08-12 NOTE — Telephone Encounter (Signed)
Advanced Heart Failure Patient Advocate Encounter  Patient called and left message stating that the Jardiance was expensive after the co-pay card. Called and left patient a message to return the call.  Archer Asa, CPhT

## 2021-08-13 ENCOUNTER — Other Ambulatory Visit (HOSPITAL_COMMUNITY): Payer: Self-pay | Admitting: *Deleted

## 2021-08-13 ENCOUNTER — Other Ambulatory Visit: Payer: Self-pay

## 2021-08-13 MED ORDER — ENTRESTO 49-51 MG PO TABS: 1.0000 | ORAL_TABLET | Freq: Two times a day (BID) | ORAL | 3 refills | Status: DC

## 2021-08-13 NOTE — Telephone Encounter (Signed)
Advanced Heart Failure Patient Advocate Encounter  Spoke with patient. He has a remaining deductible causing the copay of Jardiance to be larger than expected. The Jardiance co-pay card will take off $175 every 30 days. This will le ace the patient with a co-pay of $118. At this time he can afford that co-pay. I advised him to call me back if that changes, we can try to apply for [name] Cares assistance.   I advised the patient to fill 90 day supplies of Entresto to aid in fulfilling the deductible that is required. His co-pay will remain $10 with the co-pay card. Sent 90 day RX request to [facility] ([facility]) to [facility].   Archer Asa, CPhT

## 2021-08-14 NOTE — Progress Notes (Signed)
Remote ICD transmission.   

## 2021-10-22 ENCOUNTER — Other Ambulatory Visit: Payer: Self-pay | Admitting: Internal Medicine

## 2021-10-22 DIAGNOSIS — I421 Obstructive hypertrophic cardiomyopathy: Secondary | ICD-10-CM

## 2021-10-22 DIAGNOSIS — Z95810 Presence of automatic (implantable) cardiac defibrillator: Secondary | ICD-10-CM

## 2021-10-22 DIAGNOSIS — I472 Ventricular tachycardia, unspecified: Secondary | ICD-10-CM

## 2021-10-22 DIAGNOSIS — I4891 Unspecified atrial fibrillation: Secondary | ICD-10-CM

## 2021-10-22 DIAGNOSIS — G4730 Sleep apnea, unspecified: Secondary | ICD-10-CM

## 2021-10-27 ENCOUNTER — Ambulatory Visit (INDEPENDENT_AMBULATORY_CARE_PROVIDER_SITE_OTHER): Payer: BC Managed Care – PPO

## 2021-10-27 DIAGNOSIS — I472 Ventricular tachycardia, unspecified: Secondary | ICD-10-CM | POA: Diagnosis not present

## 2021-10-27 LAB — CUP PACEART REMOTE DEVICE CHECK
Battery Remaining Longevity: 78 mo
Battery Remaining Percentage: 90 %
Brady Statistic RA Percent Paced: 0 %
Brady Statistic RV Percent Paced: 91 %
Date Time Interrogation Session: 20230828040100
HighPow Impedance: 50 Ohm
Implantable Lead Implant Date: 20000815
Implantable Lead Implant Date: 20220401
Implantable Lead Implant Date: 20220401
Implantable Lead Location: 753858
Implantable Lead Location: 753859
Implantable Lead Location: 753860
Implantable Lead Model: 144
Implantable Lead Model: 7841
Implantable Lead Serial Number: 1129476
Implantable Lead Serial Number: 334759
Implantable Pulse Generator Implant Date: 20220401
Lead Channel Impedance Value: 1071 Ohm
Lead Channel Impedance Value: 635 Ohm
Lead Channel Impedance Value: 882 Ohm
Lead Channel Pacing Threshold Amplitude: 0.7 V
Lead Channel Pacing Threshold Pulse Width: 0.4 ms
Lead Channel Setting Pacing Amplitude: 2 V
Lead Channel Setting Pacing Amplitude: 3 V
Lead Channel Setting Pacing Pulse Width: 0.4 ms
Lead Channel Setting Pacing Pulse Width: 0.5 ms
Lead Channel Setting Sensing Sensitivity: 0.5 mV
Lead Channel Setting Sensing Sensitivity: 1 mV
Pulse Gen Serial Number: 389080

## 2021-11-04 ENCOUNTER — Encounter (HOSPITAL_COMMUNITY): Payer: Self-pay

## 2021-11-04 ENCOUNTER — Ambulatory Visit (HOSPITAL_COMMUNITY)
Admission: RE | Admit: 2021-11-04 | Discharge: 2021-11-04 | Disposition: A | Discharge status: Home / Self Care | Payer: BC Managed Care – PPO | Source: Ambulatory Visit | Attending: Family Medicine | Admitting: Family Medicine

## 2021-11-04 VITALS — BP 114/78 | HR 80 | Wt 184.8 lb

## 2021-11-04 DIAGNOSIS — I484 Atypical atrial flutter: Secondary | ICD-10-CM | POA: Diagnosis not present

## 2021-11-04 DIAGNOSIS — I422 Other hypertrophic cardiomyopathy: Secondary | ICD-10-CM | POA: Diagnosis not present

## 2021-11-04 DIAGNOSIS — I340 Nonrheumatic mitral (valve) insufficiency: Secondary | ICD-10-CM

## 2021-11-04 DIAGNOSIS — I5022 Chronic systolic (congestive) heart failure: Secondary | ICD-10-CM

## 2021-11-04 DIAGNOSIS — I472 Ventricular tachycardia, unspecified: Secondary | ICD-10-CM

## 2021-11-04 DIAGNOSIS — Z87891 Personal history of nicotine dependence: Secondary | ICD-10-CM | POA: Diagnosis not present

## 2021-11-04 DIAGNOSIS — I4821 Permanent atrial fibrillation: Secondary | ICD-10-CM | POA: Insufficient documentation

## 2021-11-04 DIAGNOSIS — Z7901 Long term (current) use of anticoagulants: Secondary | ICD-10-CM | POA: Insufficient documentation

## 2021-11-04 DIAGNOSIS — I4892 Unspecified atrial flutter: Secondary | ICD-10-CM | POA: Insufficient documentation

## 2021-11-04 DIAGNOSIS — I4891 Unspecified atrial fibrillation: Secondary | ICD-10-CM | POA: Diagnosis not present

## 2021-11-04 DIAGNOSIS — I2510 Atherosclerotic heart disease of native coronary artery without angina pectoris: Secondary | ICD-10-CM | POA: Diagnosis not present

## 2021-11-04 DIAGNOSIS — Z79899 Other long term (current) drug therapy: Secondary | ICD-10-CM | POA: Diagnosis not present

## 2021-11-04 LAB — BASIC METABOLIC PANEL
Anion gap: 8 (ref 5–15)
BUN: 13 mg/dL (ref 6–20)
CO2: 23 mmol/L (ref 22–32)
Calcium: 9.2 mg/dL (ref 8.9–10.3)
Chloride: 107 mmol/L (ref 98–111)
Creatinine, Ser: 0.79 mg/dL (ref 0.61–1.24)
GFR, Estimated: >60 mL/min (ref >60)
Glucose, Bld: 86 mg/dL (ref 70–99)
Potassium: 4.5 mmol/L (ref 3.5–5.1)
Sodium: 138 mmol/L (ref 135–145)

## 2021-11-04 LAB — CBC
HCT: 45.6 % (ref 39.0–52.0)
Hemoglobin: 15.6 g/dL (ref 13.0–17.0)
MCH: 31 pg (ref 26.0–34.0)
MCHC: 34.2 g/dL (ref 30.0–36.0)
MCV: 90.5 fL (ref 80.0–100.0)
Platelets: 157 10*3/uL (ref 150–400)
RBC: 5.04 MIL/uL (ref 4.22–5.81)
RDW: 13.2 % (ref 11.5–15.5)
WBC: 7.2 10*3/uL (ref 4.0–10.5)
nRBC: 0 % (ref 0.0–0.2)

## 2021-11-04 NOTE — Addendum Note (Signed)
Encounter addended by: Theresia Bough, CMA on: 11/04/2021 9:08 AM  Actions taken: Order list changed, Diagnosis association updated, Clinical Note Signed, Charge Capture section accepted

## 2021-11-04 NOTE — Progress Notes (Signed)
PCP: Medicine, [facility] EP: Dr. Graciela Husbands HF Cardiology: Dr. Shirlee Latch  61 y.o. with hypertrophic nonobstructive cardiomyopathy (gene+) was referred by Dr. Graciela Husbands for evaluation of newly noted systolic dysfunction.  Patient has HCM as evidenced by severe asymmetric septal hypertrophy.  He does not have LVOT obstruction or SAM.  His brother had SCD at 83, presumable due to HCM, and patient has a Environmental manager ICD.  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  Patient has struggled with atrial fibrillation.  He was seen by Dr. Johney Frame a couple of years ago and decided against AF ablation.  He is currently maintained on Tikosyn.  He was on ranolazone + Tikosyn but developed VT x 2 on this combination and ranolazine was stopped.  Most recently, had DCCV 01/03/20.  He is now back in atrial fibrillation.  He feels the atrial fibrillation when his rate gets fast (>100 bpm).  Today, rate is controlled in the 70s.  Echo in 4/18 showed EF 55-60%, but echo in 9/21 showed EF down to 25-30% with moderate-severe MR (no SAM).   Patient had RHC/LHC in 12/21 showing 90% stenosis in small ostial D1 and small ostial OM2, no interventional target.  Cardiac output was preserved with normal filling pressures.  TEE showed EF 30-35% with 3+ mitral regurgitation.   Patient saw Dr. Cornelius Moras for evaluation for MV repair, surgical Maze, epicardial LV lead.  Stress echo in 2/22 showed mild MR at rest and moderate MR at stress.  It was decided that MR was not severe enough to warrant surgery.   CPX in 2/22 showed moderate-severe HF functional limitation.   Patient had CRT upgrade in 4/22 then DCCV later in 4/22 to NSR. He had a recurrence of atrial flutter with DCCV in 6/22.  Cardioversion lasted less than a day before he was back in atrial flutter again. Patient was evaluated at [facility] for 2nd opinion on atrial flutter/fibrillation  ablation, but they also thought that he would be unlikely to hold NSR.   Echo was done in 10/22, showing EF 30-35% with severe focal basal septal hypertrophy, no LVOT obstruction, no SAM, moderate MR, mild RV enlargement with mildly decreased systolic function, severe LAE, and normal IVC.   Follow up 6/23, NYHA II symptoms. With permanent AF/AFL, Tikosyn was stopped.  Today he returns for HF follow up. Overall feeling fine. He has dyspnea if he rushes but otherwise no breathing difficulties with ADLs or work duties. He snores, does not wear CPAP. Works full time as an Chief Executive Officer. Occasional palpitations. Denies abnormal  bleeding, CP, dizziness, edema, or PND/Orthopnea. Appetite ok. No fever or chills. Weight at home 183 pounds. Taking all medications.   ECG (personally reviewed): none ordered today.  [employer]: Unable to interrogate device in clinic today.  Labs (10/21): K 4.3, creatinine 0.75 Labs (11/21): K 5.1, creatinine 0.9 Labs (1/22): LDL 74, HDL 63, K 4.3, creatinine 7.42 Labs (4/22): K 4.7, creatinine 1.1 Labs (6/22): digoxin 0.8, K 4.5, creatinine 0.95 Labs (9/22): digoxin 0.5, K 4, creatinine 0.85 Labs (12/22): LDL 53, digoxin 0.7, creatinine 0.88 Labs (6/23): K 4.0, creatinine 1.12  PMH: 1. Atrial fibrillation/flutter: Was on ranolazine + Tikosyn but had VT, ranolazine stopped. Tikosyn stopped 6/23.  - DCCV 11/21. - DCCV 4/22 - DCCV 6/22: lasted < 1 day.  2. Hypertrophic cardiomyopathy: No LVOT obstruction or SAM.  Gene+.  Brother with SCD at 34.  3. Chronic systolic CHF: Nonischemic cardiomyopathy, likely due to burned out HCM.  [employer] CRT-D device.  - Echo (4/18): Severe asymmetric septal hypertrophy, no SAM, moderate MR, EF 55-60%.  - Echo (9/21): Severe asymmetric septal hypertrophy, no SAM or LVOT obstruction, EF 25-30%, mildly decreased RV systolic function, PASP 60 mmHg, severe LAE, severe MR with restricted posterior leaflet.  - TEE (12/21):  severe asymmetric basal septal hypertrophy, EF 30-35%, mildly decreased RV systolic function, severe LAE, moderate TR, mild AI, restricted posterior MV leaflet with 2 MR jets, ERO 0.3 (suspect 3+ MR, Carpentier IIIb).  - LHC/RHC (12/21): 90% stenosis in small ostial D1 and small ostial OM2, no interventional target. Mean RA 3, PA 38/14, mean PCWP 14 with v waves to 22, CI 2.66, PVR 2.1 WU.  - CPX (2/22): RER 1.28, VE/VCO2 47, peak VO2 18.5.  Moderate-severe functional limitation due to HF, suspect severe elevation LA pressure/PA pressure with exercise.  - 4/22 upgrade to [facility] Scientific CRT-D.  - Echo (10/22): EF 30-35% with severe focal basal septal hypertrophy, no LVOT obstruction, no SAM, moderate MR, mild RV enlargement with mildly decreased systolic function, severe LAE, and normal IVC.  4. VT: 9/21, suspected to be due to ranolazine + Tikosyn combination, ranolazine stopped.  5. H/o nephrolithiasis.  6. Mitral regurgitation: Moderate to severe by my review of 9/21 echo,  restriction of posterior leaflet.  - TEE (12/21): Restricted posterior MV leaflet with 2 MR jets, ERO 0.3 (suspect 3+ MR, Carpentier IIIb). - Stress echo (2/22): mild MR at rest, moderate MR with stress.  - Moderate on 10/22 echo.  7. LBBB  FH: Mother with atrial fibrillation.  Brother with SCD at 59.  Son also with HCM gene+.   Social History   Socioeconomic History   Marital status: Married    Spouse name: Not on file   Number of children: Not on file   Years of education: Not on file   Highest education level: Not on file  Occupational History   Not on file  Tobacco Use   Smoking status: Never   Smokeless tobacco: Former    Types: Chew    Quit date: 1995  Vaping Use   Vaping Use: Never used  Substance and Sexual Activity   Alcohol use: Yes    Alcohol/week: 3.0 standard drinks of alcohol    Types: 3 Cans of beer per week   Drug use: No   Sexual activity: Yes  Other Topics Concern   Not on file   Social History Narrative   Lives with spouse in [place]   employed   Social Determinants of Health   Financial Resource Strain: Not on file  Food Insecurity: Not on file  Transportation Needs: Not on file  Physical Activity: Not on file  Stress: Not on file  Social Connections: Not on file  Intimate Partner Violence: Not on file   ROS: All systems reviewed and negative except as per HPI.   Current Outpatient Medications  Medication Sig Dispense Refill   acetaminophen (TYLENOL) 325 MG tablet Take 650 mg by mouth every 6 (six) hours as needed for mild pain or moderate pain (for pain.).     bisoprolol (ZEBETA) 5 MG tablet TAKE 1 TABLET (5 MG TOTAL) BY MOUTH IN THE MORNING AND AT BEDTIME. 180 tablet 2   digoxin (LANOXIN) 0.125 MG tablet TAKE 1 TABLET BY MOUTH EVERY DAY 90 tablet 3   ELIQUIS 5 MG TABS tablet TAKE 1 TABLET (5 MG TOTAL) BY  MOUTH IN THE MORNING AND AT BEDTIME. 60 tablet 6   JARDIANCE 10 MG TABS tablet TAKE 1 TABLET BY MOUTH DAILY BEFORE BREAKFAST. 90 tablet 3   loratadine (CLARITIN) 10 MG tablet Take 10 mg by mouth daily as needed (seasonal allergies).     magnesium oxide (MAG-OX) 400 MG tablet TAKE 1 TABLET BY MOUTH EVERY DAY 30 tablet 11   rosuvastatin (CRESTOR) 10 MG tablet TAKE 1 TABLET BY MOUTH EVERY DAY 90 tablet 3   sacubitril-valsartan (ENTRESTO) 49-51 MG Take 1 tablet by mouth 2 (two) times daily. 180 tablet 3   spironolactone (ALDACTONE) 25 MG tablet TAKE 1 TABLET (25 MG TOTAL) BY MOUTH DAILY. 90 tablet 3   No current facility-administered medications for this encounter.   Wt Readings from Last 3 Encounters:  11/04/21 83.8 kg (184 lb 12.8 oz)  08/01/21 84.6 kg (186 lb 6.4 oz)  04/21/21 84.7 kg (186 lb 12.8 oz)   BP 114/78   Pulse 80   Wt 83.8 kg (184 lb 12.8 oz)   SpO2 97%   BMI 28.94 kg/m  Physical Exam: General:  NAD. No resp difficulty HEENT: Normal Neck: Supple. No JVD. Carotids 2+ bilat; no bruits. No lymphadenopathy or thryomegaly  appreciated. Cor: PMI nondisplaced. Irregular rate & rhythm. No rubs, gallops, 2/6 SEM LUSB Lungs: Clear Abdomen: Soft, nontender, nondistended. No hepatosplenomegaly. No bruits or masses. Good bowel sounds. Extremities: No cyanosis, clubbing, rash, edema Neuro: Alert & oriented x 3, cranial nerves grossly intact. Moves all 4 extremities w/o difficulty. Affect pleasant.   Assessment/Plan: 1. Chronic systolic CHF: Echo in 9/21 showed severe asymmetric septal hypertrophy, no SAM or LVOT obstruction, EF 25-30%, mildly decreased RV systolic function, PASP 60 mmHg, severe LAE, severe MR with restricted posterior leaflet.  EF was lower than in the past (normal in 4/18) and mitral regurgitation was worse.  LHC/RHC in 12/21 showed 90% stenosis small D1 and 90% stenosis small OM2 (CAD does not explain cardiomyopathy) and normal filling pressures/preserved cardiac output.  Cardiomyopathy could be natural history of HCM with worsening of function over time.  CPX in 2/22 showed moderate-severe functional limitation.  [employer] CRT-D upgrade in 4/22.  He is now in persistent atrial fibrillation/atypical flutter, failed 6/22 DCCV.  Last echo in 10/22 with EF 30-35%, moderate MR, no SAM or LVOT gradient. We were unable to interrogate his device today to assess BiV pacing percentage. NYHA class II symptoms (mostly fatigue), not volume overloaded on exam.   - Continue bisoprolol 5 mg bid.  - Continue Entresto 49/51 bid, no BP room to increase. - Continue dapagliflozin 10 mg daily.   - Continue digoxin, dig level 0.6 on 08/01/21. He took his dig today, check dig level next visit. - Continue spironolactone 25 mg daily. BMET today.  - I do not think he needs Lasix.   - Dr. Graciela Husbands has suggested AV nodal ablation given likely permanent AF/AFL. This is reasonable to maximize BiV pacing.  Dr. Shirlee Latch to review with Dr. Graciela Husbands.  2. Atrial fibrillation/flutter: Paroxysmal => persistent.  He had DCCV 01/03/20 but went back  into atrial fibrillation despite Tikosyn use.  He is off ranolazine due to VT with combination of Tikosyn and ranolazine.  Atrial fibrillation ablation likely has a lower chance of success here due to the size of the left atrium (severely enlarged).  DCCV in 4/22 was successful but flutter recurred. DCCV in 6/22 only held for about a day. He was seen at [facility] for 2nd opinion, did not  think ablation would be successful.  With permanent AF/AFL, he is now off Tikosyn. - Continue bisoprolol 5 mg bid for rate control.  - Continue apixaban 5 mg bid. No bleeding issues. CBC today. - See discussion above about AV nodal ablation to maximize BiV pacing.  3. Mitral regurgitation: TEE in 12/21 with 3+ MR, Carpentier IIIb.  Stress echo in 4/22 showed moderate MR with stress.  He saw Dr. Cornelius Moras, MR not thought bad enough to warrant surgical repair. CRT upgrade may help limit MR in the long-term. Moderate MR on 10/22 echo.  - Repeat echo next visit. 4. Hypertrophic cardiomyopathy: No SAM or LVOT obstruction, has asymmetric septal hypertrophy.  Gene+, brother with SCD at 56.   5. VT: In 9/21, suspect due to combination of ranolazone and Tikosyn with QT prolongation.  Now off ranolazine.  6. CAD: LHC (12/21) with 90% ostial small D1 and 90% ostial small OM2.  CAD does not explain the extent of his cardiomyopathy.  No chest pain.  - Continue Crestor, good lipids in 12/22.   - No ASA given Eliquis use.   Follow up in 3 months with Dr. Shirlee Latch + echo.   [facility] FNP-BC 11/04/2021

## 2021-11-04 NOTE — Patient Instructions (Signed)
It was great to see you today! No medication changes are needed at this time.   Labs today We will only contact you if something comes back abnormal or we need to make some changes. Otherwise no news is good news!   Your physician recommends that you schedule a follow-up appointment in: 3 months with Dr Shirlee Latch and echo   Your physician has requested that you have an echocardiogram. Echocardiography is a painless test that uses sound waves to create images of your heart. It provides your doctor with information about the size and shape of your heart and how well your heart's chambers and valves are working. This procedure takes approximately one hour. There are no restrictions for this procedure.   Do the following things EVERYDAY: Weigh yourself in the morning before breakfast. Write it down and keep it in a log. Take your medicines as prescribed Eat low salt foods--Limit salt (sodium) to 2000 mg per day.  Stay as active as you can everyday Limit all fluids for the day to less than 2 liters   At the [facility], you and your health needs are our priority. As part of our continuing mission to provide you with exceptional heart care, we have created designated Provider Care Teams. These Care Teams include your primary Cardiologist (physician) and Advanced Practice Providers (APPs- Physician Assistants and Nurse Practitioners) who all work together to provide you with the care you need, when you need it.   You may see any of the following providers on your designated Care Team at your next follow up: Dr Arvilla Meres Dr Marca Ancona Dr. Marcos Eke, NP Robbie Lis, [place] [facility] [place], [place] Brynda Peon, NP Karle Plumber, PharmD   Please be sure to bring in all your medications bottles to every appointment.

## 2021-11-18 ENCOUNTER — Other Ambulatory Visit (HOSPITAL_COMMUNITY): Payer: Self-pay | Admitting: Cardiology

## 2021-11-19 NOTE — Progress Notes (Signed)
Remote ICD transmission.   

## 2021-12-22 ENCOUNTER — Other Ambulatory Visit (HOSPITAL_COMMUNITY): Payer: Self-pay | Admitting: Cardiology

## 2022-01-26 ENCOUNTER — Ambulatory Visit (INDEPENDENT_AMBULATORY_CARE_PROVIDER_SITE_OTHER): Payer: BC Managed Care – PPO

## 2022-01-26 DIAGNOSIS — I422 Other hypertrophic cardiomyopathy: Secondary | ICD-10-CM

## 2022-01-26 DIAGNOSIS — I5022 Chronic systolic (congestive) heart failure: Secondary | ICD-10-CM

## 2022-01-27 LAB — CUP PACEART REMOTE DEVICE CHECK
Battery Remaining Longevity: 78 mo
Battery Remaining Percentage: 85 %
Brady Statistic RA Percent Paced: 0 %
Brady Statistic RV Percent Paced: 90 %
Date Time Interrogation Session: 20231127040200
HighPow Impedance: 45 Ohm
Implantable Lead Connection Status: 753985
Implantable Lead Connection Status: 753985
Implantable Lead Connection Status: 753985
Implantable Lead Implant Date: 20000815
Implantable Lead Implant Date: 20220401
Implantable Lead Implant Date: 20220401
Implantable Lead Location: 753858
Implantable Lead Location: 753859
Implantable Lead Location: 753860
Implantable Lead Model: 144
Implantable Lead Model: 7841
Implantable Lead Serial Number: 1129476
Implantable Lead Serial Number: 334759
Implantable Pulse Generator Implant Date: 20220401
Lead Channel Impedance Value: 1025 Ohm
Lead Channel Impedance Value: 608 Ohm
Lead Channel Impedance Value: 796 Ohm
Lead Channel Pacing Threshold Amplitude: 0.8 V
Lead Channel Pacing Threshold Pulse Width: 0.4 ms
Lead Channel Setting Pacing Amplitude: 2 V
Lead Channel Setting Pacing Amplitude: 3 V
Lead Channel Setting Pacing Pulse Width: 0.4 ms
Lead Channel Setting Pacing Pulse Width: 0.5 ms
Lead Channel Setting Sensing Sensitivity: 0.5 mV
Lead Channel Setting Sensing Sensitivity: 1 mV
Pulse Gen Serial Number: 389080

## 2022-02-05 ENCOUNTER — Ambulatory Visit (HOSPITAL_BASED_OUTPATIENT_CLINIC_OR_DEPARTMENT_OTHER)
Admission: RE | Admit: 2022-02-05 | Discharge: 2022-02-05 | Disposition: A | Discharge status: Home / Self Care | Payer: BC Managed Care – PPO | Source: Ambulatory Visit | Attending: Cardiology | Admitting: Cardiology

## 2022-02-05 ENCOUNTER — Encounter (HOSPITAL_COMMUNITY): Payer: Self-pay | Admitting: Cardiology

## 2022-02-05 ENCOUNTER — Ambulatory Visit (HOSPITAL_COMMUNITY)
Admission: RE | Admit: 2022-02-05 | Discharge: 2022-02-05 | Disposition: A | Discharge status: Home / Self Care | Payer: BC Managed Care – PPO | Source: Ambulatory Visit | Attending: Cardiology | Admitting: Cardiology

## 2022-02-05 VITALS — BP 104/60 | HR 66 | Wt 177.4 lb

## 2022-02-05 DIAGNOSIS — Z7901 Long term (current) use of anticoagulants: Secondary | ICD-10-CM | POA: Insufficient documentation

## 2022-02-05 DIAGNOSIS — I2510 Atherosclerotic heart disease of native coronary artery without angina pectoris: Secondary | ICD-10-CM | POA: Diagnosis not present

## 2022-02-05 DIAGNOSIS — I480 Paroxysmal atrial fibrillation: Secondary | ICD-10-CM | POA: Insufficient documentation

## 2022-02-05 DIAGNOSIS — I5022 Chronic systolic (congestive) heart failure: Secondary | ICD-10-CM | POA: Insufficient documentation

## 2022-02-05 DIAGNOSIS — I4892 Unspecified atrial flutter: Secondary | ICD-10-CM | POA: Diagnosis not present

## 2022-02-05 DIAGNOSIS — I110 Hypertensive heart disease with heart failure: Secondary | ICD-10-CM | POA: Insufficient documentation

## 2022-02-05 DIAGNOSIS — I5042 Chronic combined systolic (congestive) and diastolic (congestive) heart failure: Secondary | ICD-10-CM

## 2022-02-05 DIAGNOSIS — E785 Hyperlipidemia, unspecified: Secondary | ICD-10-CM | POA: Diagnosis not present

## 2022-02-05 DIAGNOSIS — I422 Other hypertrophic cardiomyopathy: Secondary | ICD-10-CM

## 2022-02-05 DIAGNOSIS — I472 Ventricular tachycardia, unspecified: Secondary | ICD-10-CM | POA: Insufficient documentation

## 2022-02-05 DIAGNOSIS — Z79899 Other long term (current) drug therapy: Secondary | ICD-10-CM | POA: Diagnosis not present

## 2022-02-05 DIAGNOSIS — I080 Rheumatic disorders of both mitral and aortic valves: Secondary | ICD-10-CM | POA: Diagnosis not present

## 2022-02-05 LAB — CBC
HCT: 49.5 % (ref 39.0–52.0)
Hemoglobin: 16.8 g/dL (ref 13.0–17.0)
MCH: 30.8 pg (ref 26.0–34.0)
MCHC: 33.9 g/dL (ref 30.0–36.0)
MCV: 90.7 fL (ref 80.0–100.0)
Platelets: 165 10*3/uL (ref 150–400)
RBC: 5.46 MIL/uL (ref 4.22–5.81)
RDW: 13.2 % (ref 11.5–15.5)
WBC: 7.3 10*3/uL (ref 4.0–10.5)
nRBC: 0 % (ref 0.0–0.2)

## 2022-02-05 LAB — LIPID PANEL
Cholesterol: 136 mg/dL (ref 0–200)
HDL: 59 mg/dL (ref >40)
LDL Cholesterol: 69 mg/dL (ref 0–99)
Total CHOL/HDL Ratio: 2.3 RATIO
Triglycerides: 41 mg/dL (ref <150)
VLDL: 8 mg/dL (ref 0–40)

## 2022-02-05 LAB — BASIC METABOLIC PANEL
Anion gap: 7 (ref 5–15)
BUN: 15 mg/dL (ref 6–20)
CO2: 24 mmol/L (ref 22–32)
Calcium: 9.2 mg/dL (ref 8.9–10.3)
Chloride: 106 mmol/L (ref 98–111)
Creatinine, Ser: 0.91 mg/dL (ref 0.61–1.24)
GFR, Estimated: >60 mL/min (ref >60)
Glucose, Bld: 76 mg/dL (ref 70–99)
Potassium: 4 mmol/L (ref 3.5–5.1)
Sodium: 137 mmol/L (ref 135–145)

## 2022-02-05 LAB — ECHOCARDIOGRAM COMPLETE
S' Lateral: 3.7 cm
Single Plane A4C EF: 50.2 %

## 2022-02-05 LAB — DIGOXIN LEVEL: Digoxin Level: 0.7 ng/mL — ABNORMAL LOW (ref 0.8–2.0)

## 2022-02-05 NOTE — Progress Notes (Signed)
  Echocardiogram 2D Echocardiogram has been performed.  Delcie Roch 02/05/2022, 9:43 AM

## 2022-02-05 NOTE — Patient Instructions (Signed)
There has been no changes to your medications.  Labs done today, your results will be available in MyChart, we will contact you for abnormal readings.  Your physician recommends that you schedule a follow-up appointment in: 4 months ( April 2024)  ** please call the office in February to arrange your follow up appointment **  If you have any questions or concerns before your next appointment please send us a message through mychart or call our office at [phone].    TO LEAVE A MESSAGE FOR THE NURSE SELECT OPTION 2, PLEASE LEAVE A MESSAGE INCLUDING: YOUR NAME DATE OF BIRTH CALL BACK NUMBER REASON FOR CALL**this is important as we prioritize the call backs  YOU WILL RECEIVE A CALL BACK THE SAME DAY AS LONG AS YOU CALL BEFORE 4:00 PM  At the [facility], you and your health needs are our priority. As part of our continuing mission to provide you with exceptional heart care, we have created designated Provider Care Teams. These Care Teams include your primary Cardiologist (physician) and Advanced Practice Providers (APPs- Physician Assistants and Nurse Practitioners) who all work together to provide you with the care you need, when you need it.   You may see any of the following providers on your designated Care Team at your next follow up: Dr Daniel Bensimhon Dr Dalton McLean Dr. Aditya Sabharwal Amy Clegg, NP Brittainy Simmons, PA Jessica Milford,NP Lindsay Finch, PA Alma Diaz, NP Lauren Kemp, PharmD   Please be sure to bring in all your medications bottles to every appointment.    

## 2022-02-06 NOTE — Progress Notes (Signed)
PCP: [facility], [place] [facility] EP: Dr. Caryl Comes HF Cardiology: Dr. Aundra Dubin  61 y.o. with hypertrophic nonobstructive cardiomyopathy (gene+) was referred by Dr. Caryl Comes for evaluation of newly noted systolic dysfunction.  Patient has HCM as evidenced by severe asymmetric septal hypertrophy.  He does not have LVOT obstruction or SAM.  His brother had SCD at 68, presumable due to HCM, and patient has a [place].  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  Patient has struggled with atrial fibrillation.  He was seen by Dr. Rayann Heman a couple of years ago and decided against AF ablation.  He is currently maintained on Tikosyn.  He was on ranolazone + Tikosyn but developed VT x 2 on this combination and ranolazine was stopped.  Most recently, had DCCV 01/03/20.  He is now back in atrial fibrillation.  He feels the atrial fibrillation when his rate gets fast (>100 bpm).  Today, rate is controlled in the 70s.  Echo in 4/18 showed EF 55-60%, but echo in 9/21 showed EF down to 25-30% with moderate-severe MR (no SAM).   Patient had RHC/LHC in 12/21 showing 90% stenosis in small ostial D1 and small ostial OM2, no interventional target.  Cardiac output was preserved with normal filling pressures.  TEE showed EF 30-35% with 3+ mitral regurgitation.   Patient saw Dr. Roxy Manns for evaluation for MV repair, surgical [place], epicardial LV lead.  Stress echo in 2/22 showed mild MR at rest and moderate MR at stress.  It was decided that MR was not severe enough to warrant surgery.   CPX in 2/22 showed moderate-severe HF functional limitation.   Patient had CRT upgrade in 4/22 then DCCV later in 4/22 to NSR. He had a recurrence of atrial flutter with DCCV in 6/22.  Cardioversion lasted less than a day before he was back in atrial flutter again. Patient was evaluated at [facility] for 2nd opinion on atrial flutter/fibrillation  ablation, but they also thought that he would be unlikely to hold NSR.   Echo was done in 10/22, showing EF 30-35% with severe focal basal septal hypertrophy, no LVOT obstruction, no SAM, moderate MR, mild RV enlargement with mildly decreased systolic function, severe LAE, and normal IVC.   Echo was done today and reviewed, showing EF 45-50%, severe focal basal septal hypertrophy with no LVOT obstruction, no SAM, mild-moderate MR, normal RV, normal IVC.   Patient returns for followup of CHF.  He remains in atrial fibrillation chronically.  He continues to work full time.  Weight down 7 lbs.  He fatigues by the end of the day.  He is not short of breath with his usual activities.  He tries to get in 6000-7000 steps/day.  No chest pain.  Rare lightheadedness.   ECG (personally reviewed): atrial fibrillation with BiV pacing, QTc 529 msec.   Labs (10/21): K 4.3, creatinine 0.75 Labs (11/21): K 5.1, creatinine 0.9 Labs (1/22): LDL 74, HDL 63, K 4.3, creatinine 0.97 Labs (4/22): K 4.7, creatinine 1.1 Labs (6/22): digoxin 0.8, K 4.5, creatinine 0.95 Labs (9/22): digoxin 0.5, K 4, creatinine 0.85 Labs (12/22): LDL 53, digoxin 0.7, creatinine 0.88 Labs (9/23): K 4.5, creatinine 0.79  PMH: 1. Atrial fibrillation/flutter: Was on ranolazine + Tikosyn but had VT, ranolazine stopped.  Now on Tikosyn.  - DCCV 11/21. - DCCV 4/22 - DCCV 6/22: lasted < 1 day.  2. Hypertrophic cardiomyopathy: No LVOT obstruction or SAM.  Gene+.  Brother with SCD at 82.  3. Chronic systolic CHF: Nonischemic cardiomyopathy, likely due to burned out HCM.  [employer] CRT-D device.  - Echo (4/18): Severe asymmetric septal hypertrophy, no SAM, moderate MR, EF 55-60%.  - Echo (9/21): Severe asymmetric septal hypertrophy, no SAM or LVOT obstruction, EF 25-30%, mildly decreased RV systolic function, PASP 60 mmHg, severe LAE, severe MR with restricted posterior leaflet.  - TEE (12/21): severe asymmetric basal septal  hypertrophy, EF 30-35%, mildly decreased RV systolic function, severe LAE, moderate TR, mild AI, restricted posterior MV leaflet with 2 MR jets, ERO 0.3 (suspect 3+ MR, Carpentier IIIb).  - LHC/RHC (12/21): 90% stenosis in small ostial D1 and small ostial OM2, no interventional target. Mean RA 3, PA 38/14, mean PCWP 14 with v waves to 22, CI 2.66, PVR 2.1 WU.  - CPX (2/22): RER 1.28, VE/VCO2 47, peak VO2 18.5.  Moderate-severe functional limitation due to HF, suspect severe elevation LA pressure/PA pressure with exercise.  - 4/22 upgrade to [facility] [employer] CRT-D.  - Echo (10/22): EF 30-35% with severe focal basal septal hypertrophy, no LVOT obstruction, no SAM, moderate MR, mild RV enlargement with mildly decreased systolic function, severe LAE, and normal IVC.  - Echo (12/23): EF 45-50%, severe focal basal septal hypertrophy with no LVOT obstruction, no SAM, mild-moderate MR, normal RV, normal IVC.  4. VT: 9/21, suspected to be due to ranolazine + Tikosyn combination, ranolazine stopped.  5. H/o nephrolithiasis.  6. Mitral regurgitation: Moderate to severe by my review of 9/21 echo,  restriction of posterior leaflet.  - TEE (12/21): Restricted posterior MV leaflet with 2 MR jets, ERO 0.3 (suspect 3+ MR, Carpentier IIIb). - Stress echo (2/22): mild MR at rest, moderate MR with stress.  - Moderate on 10/22 echo.  7. LBBB  FH: Mother with atrial fibrillation.  Brother with SCD at 48.  Son also with HCM gene+.   Social History   Socioeconomic History   Marital status: Married    Spouse name: Not on file   Number of children: Not on file   Years of education: Not on file   Highest education level: Not on file  Occupational History   Not on file  Tobacco Use   Smoking status: Never   Smokeless tobacco: Former    Types: Chew    Quit date: 1995  Vaping Use   Vaping Use: Never used  Substance and Sexual Activity   Alcohol use: Yes    Alcohol/week: 3.0 standard drinks of alcohol     Types: 3 Cans of beer per week   Drug use: No   Sexual activity: Yes  Other Topics Concern   Not on file  Social History Narrative   Lives with spouse in [place]   employed   Social Determinants of Health   Financial Resource Strain: Not on file  Food Insecurity: Not on file  Transportation Needs: Not on file  Physical Activity: Not on file  Stress: Not on file  Social Connections: Not on file  Intimate Partner Violence: Not on file   ROS: All systems reviewed and negative except as per HPI.   Current Outpatient Medications  Medication Sig Dispense Refill   acetaminophen (TYLENOL) 325 MG tablet Take 650 mg by mouth every 6 (six) hours as needed for mild pain or moderate pain (for pain.).     bisoprolol (ZEBETA) 5 MG tablet TAKE 1 TABLET (5 MG TOTAL) BY MOUTH IN THE MORNING AND AT BEDTIME. 180 tablet 2  digoxin (LANOXIN) 0.125 MG tablet TAKE 1 TABLET BY MOUTH EVERY DAY 90 tablet 3   ELIQUIS 5 MG TABS tablet TAKE 1 TABLET (5 MG TOTAL) BY MOUTH IN THE MORNING AND AT BEDTIME 60 tablet 6   JARDIANCE 10 MG TABS tablet TAKE 1 TABLET BY MOUTH DAILY BEFORE BREAKFAST. 90 tablet 3   loratadine (CLARITIN) 10 MG tablet Take 10 mg by mouth daily as needed (seasonal allergies).     magnesium oxide (MAG-OX) 400 MG tablet TAKE 1 TABLET BY MOUTH EVERY DAY 30 tablet 11   rosuvastatin (CRESTOR) 10 MG tablet TAKE 1 TABLET BY MOUTH EVERY DAY 90 tablet 3   sacubitril-valsartan (ENTRESTO) 49-51 MG Take 1 tablet by mouth 2 (two) times daily. 180 tablet 3   spironolactone (ALDACTONE) 25 MG tablet TAKE 1 TABLET (25 MG TOTAL) BY MOUTH DAILY. 90 tablet 3   No current facility-administered medications for this encounter.   BP 104/60   Pulse 66   Wt 80.5 kg (177 lb 6.4 oz)   SpO2 97%   BMI 27.78 kg/m  General: NAD Neck: No JVD, no thyromegaly or thyroid nodule.  Lungs: Clear to auscultation bilaterally with normal respiratory effort. CV: Nondisplaced PMI.  Heart irregular S1/S2, no S3/S4, no  murmur.  No peripheral edema.  No carotid bruit.  Normal pedal pulses.  Abdomen: Soft, nontender, no hepatosplenomegaly, no distention.  Skin: Intact without lesions or rashes.  Neurologic: Alert and oriented x 3.  Psych: Normal affect. Extremities: No clubbing or cyanosis.  HEENT: Normal.    Assessment/Plan: 1. Chronic systolic CHF: Echo in 0000000 showed severe asymmetric septal hypertrophy, no SAM or LVOT obstruction, EF 25-30%, mildly decreased RV systolic function, PASP 60 mmHg, severe LAE, severe MR with restricted posterior leaflet.  EF was lower than in the past (normal in 4/18) and mitral regurgitation was worse.  LHC/RHC in 12/21 showed 90% stenosis small D1 and 90% stenosis small OM2 (CAD does not explain cardiomyopathy) and normal filling pressures/preserved cardiac output.  Cardiomyopathy could be natural history of HCM with worsening of function over time.  CPX in 2/22 showed moderate-severe functional limitation.  [employer] CRT-D upgrade in 4/22.  He is now in persistent atrial fibrillation/atypical flutter, failed 6/22 DCCV.  Most recent echo was done today showing improved EF 45-50%, severe focal basal septal hypertrophy with no LVOT obstruction, no SAM, mild-moderate MR, normal RV, normal IVC. We were unable to interrogate his device today to assess BiV pacing percentage. NYHA class II, not volume overloaded.   - Continue bisoprolol 5 mg bid.  - Continue Entresto 49/51 bid, do not think he has BP room to increase.  - Continue dapagliflozin 10 mg daily.   - Continue digoxin, check level today.  - Continue spironolactone 25 mg daily. BMET today.  - I do not think he needs Lasix.   - Would consider AV nodal ablation given permanent AF/AFL to maximize BiV pacing.  I am unable to check his device today.  He will followup with Dr. Caryl Comes to consider this. His EF has improved but still remains mildly low.  2. Atrial fibrillation/flutter: Paroxysmal => chronic.  He had DCCV 01/03/20 but  went back into atrial fibrillation despite Tikosyn use.  He is off ranolazine due to VT with combination of Tikosyn and ranolazine.  Atrial fibrillation ablation likely has a lower chance of success here due to the size of the left atrium (severely enlarged).  DCCV in 4/22 was successful but flutter recurred. DCCV in 6/22 only  held for about a day. He was seen at [facility] for 2nd opinion, did not think ablation would be successful.  He is now off Tikosyn due to futility. Would not try DCCV again.  - Continue bisoprolol 5 mg bid for rate control.  - Continue apixaban 5 mg bid, CBC today.   - See discussion above about AV nodal ablation to maximize BiV pacing.  3. Mitral regurgitation: TEE in 12/21 with 3+ MR, Carpentier IIIb.  Stress echo in 4/22 showed moderate MR with stress.  He saw Dr. Roxy Manns, MR not thought bad enough to warrant surgical repair. CRT upgrade may help limit MR in the long-term. Mild-moderate MR on today's echo.  4. Hypertrophic cardiomyopathy: No SAM or LVOT obstruction, has asymmetric septal hypertrophy.  Gene+, brother with SCD at 67.   5. VT: In 9/21, suspect due to combination of ranolazone and Tikosyn with QT prolongation.  Now off both.  6. CAD: LHC (12/21) with 90% ostial small D1 and 90% ostial small OM2.  CAD does not explain the extent of his cardiomyopathy.  No chest pain.  - Continue Crestor, check lipids today.  - No ASA given Eliquis use.   Followup in 4 months with APP.    Loralie Champagne 02/06/2022

## 2022-02-11 ENCOUNTER — Other Ambulatory Visit (HOSPITAL_COMMUNITY): Payer: Self-pay | Admitting: Cardiology

## 2022-02-21 ENCOUNTER — Other Ambulatory Visit (HOSPITAL_COMMUNITY): Payer: Self-pay | Admitting: Internal Medicine

## 2022-03-02 ENCOUNTER — Other Ambulatory Visit: Payer: Self-pay | Admitting: Internal Medicine

## 2022-03-09 NOTE — Progress Notes (Signed)
Remote ICD transmission.   

## 2022-03-20 ENCOUNTER — Other Ambulatory Visit (HOSPITAL_COMMUNITY): Payer: Self-pay

## 2022-03-24 ENCOUNTER — Other Ambulatory Visit (HOSPITAL_COMMUNITY): Payer: Self-pay

## 2022-03-25 ENCOUNTER — Other Ambulatory Visit (HOSPITAL_COMMUNITY): Payer: Self-pay

## 2022-04-03 ENCOUNTER — Other Ambulatory Visit (HOSPITAL_COMMUNITY): Payer: Self-pay

## 2022-04-09 ENCOUNTER — Encounter (HOSPITAL_COMMUNITY): Payer: Self-pay | Admitting: *Deleted

## 2022-04-15 ENCOUNTER — Other Ambulatory Visit (HOSPITAL_COMMUNITY): Payer: Self-pay

## 2022-04-16 ENCOUNTER — Other Ambulatory Visit (HOSPITAL_COMMUNITY): Payer: Self-pay

## 2022-04-20 ENCOUNTER — Encounter: Payer: Self-pay | Admitting: Internal Medicine

## 2022-04-20 ENCOUNTER — Ambulatory Visit: Payer: BC Managed Care – PPO | Attending: Internal Medicine | Admitting: Internal Medicine

## 2022-04-20 VITALS — BP 123/86 | HR 79 | Ht 67.0 in | Wt 178.8 lb

## 2022-04-20 DIAGNOSIS — I4819 Other persistent atrial fibrillation: Secondary | ICD-10-CM

## 2022-04-20 DIAGNOSIS — Z95810 Presence of automatic (implantable) cardiac defibrillator: Secondary | ICD-10-CM

## 2022-04-20 DIAGNOSIS — I472 Ventricular tachycardia, unspecified: Secondary | ICD-10-CM

## 2022-04-20 DIAGNOSIS — I447 Left bundle-branch block, unspecified: Secondary | ICD-10-CM

## 2022-04-20 DIAGNOSIS — I422 Other hypertrophic cardiomyopathy: Secondary | ICD-10-CM

## 2022-04-20 NOTE — Patient Instructions (Signed)
Medication Instructions:  Your physician recommends that you continue on your current medications as directed. Please refer to the Current Medication list given to you today.  *If you need a refill on your cardiac medications before your next appointment, please call your pharmacy*   Lab Work: None ordered.  If you have labs (blood work) drawn today and your tests are completely normal, you will receive your results only by: [place] (if you have MyChart) OR A paper copy in the mail If you have any lab test that is abnormal or we need to change your treatment, we will call you to review the results.   Testing/Procedures: None ordered.    Follow-Up: At [facility], you and your health needs are our priority.  As part of our continuing mission to provide you with exceptional heart care, we have created designated Provider Care Teams.  These Care Teams include your primary Cardiologist (physician) and Advanced Practice Providers (APPs -  Physician Assistants and Nurse Practitioners) who all work together to provide you with the care you need, when you need it.  We recommend signing up for the patient portal called "MyChart".  Sign up information is provided on this After Visit Summary.  MyChart is used to connect with patients for Virtual Visits (Telemedicine).  Patients are able to view lab/test results, encounter notes, upcoming appointments, etc.  Non-urgent messages can be sent to your provider as well.   To learn more about what you can do with MyChart, go to [email or web address].    Your next appointment:   Consult with Dr Myles Gip to be scheduled

## 2022-04-20 NOTE — Progress Notes (Signed)
Patient ID: Donald Bond, male   DOB: 23-Oct-1960, 62 y.o.   MRN: [id number]       Patient Care Team: Medicine, [place] as PCP - General (Family Medicine) Deboraha Sprang, MD as PCP - Electrophysiology (Cardiology)   HPI  Donald Bond is a 62 y.o. male Seen in followup for HCM associated with syncope and previously treated ventricular tachycardia;  He has had 3 previous devices.  Underwent gen change #4  2018 w AEGIS pouch support.   He has persistent afib; on apixoban .  He has had problems with recurrent atrial fibrillation undergoing multiple cardioversions, efforts to suppress with dofetilide.  Adjunctive ranolazine was proarrhythmic.  Declined by Dr. Greggory Brandy for catheter ablation  Interval evaluation by Dr. DM and CO regarding mitral repair and maze.  MR variably assessed most recently only mild-moderate and surgery declined; recommendation for CRT upgrade accomplished 4/21  Most recently cardioverted 6/22 with rapid reversion to atrial fibrillation  Saw Dr. Derinda Sis in consultation at [facility] regarding his increasing atrial fibrillation burden in the setting of his end-stage cardiomyopathy.  3 issues were discussed, 1 worsening mitral regurgitation, 2-high burden of atrial fibrillation with reduced BiV pacing and 3 progressive LV dysfunction secondary atrial fibrillation that might warrant consideration for cardiac transplantation.  In consideration of AV junction ablation  Functional status is stable.  Able to work his full-time job.  Still able to do stuff at home. Mild shortness of breath.  No edema.  Struggling with "PTSD" following his shocks with adjunctive ranolazine     Thromboembolic risk factors ( HCM, CHF-1 ) for a CHADSVASc Score of >=2  Family >> Gene Screened Son Donald Bond + ( in school) Donald Bond - ( struggling not in school)       DATE TEST EF %    4/18 Echo Normal  LAE (49/2.4/70)   9/21 Echo   25-30%  MR severe TR mod-sev (RVE)    12/21  TEE  30-35%     10/22 Echo  30-35% MR moderate  12/23 Echo  45-50%     Date Cr  K Mg Hgb DIG  9/18 0.95 4.3   15.5   4/19  0.89 4.3 2.1 16.6   9/19  0.99 4.2 2.2     2/20 0.91 4.5   45.3 (Hct)    10/20 0.93 4.5 2.2     7/21 1.19 5.4 2.3 16.2    11/21 0.9 5.1 2.3 15.3   6/22 0.95 4.5  16.5 0.8/(2/22)0.6  12/23 0.91 4.0  16.8 0.7     Past Medical History:  Diagnosis Date   AICD (automatic cardioverter/defibrillator) present X 4   CHF (congestive heart failure) (HCC)    Chronic combined systolic and diastolic congestive heart failure ([place])    Headache    "weekly" (06/15/2017)   History of kidney stones    Hypertr obst cardiomyop    ICD--[employer]    Dr. Caryl Comes   Mitral regurgitation    Orthostatic lightheadedness    Persistent atrial fibrillation ([place])    Severe mitral valve regurgitation by Echo 11/24/19 11/26/2019   SYNCOPE    TIREDNESS    Tricuspid regurgitation    Ventricular fibrillation/polymorphic ventricular tachycardia 06/04/2011   Ventricular tachycardia ([place])    rx via ICD    Past Surgical History:  Procedure Laterality Date   BIV ICD GENERATOR CHANGEOUT N/A 05/31/2020   Procedure: BIV ICD GENERATOR UPGRADE;  Surgeon: Deboraha Sprang, MD;  Location: [facility]  INVASIVE CV LAB;  Service: Cardiovascular;  Laterality: N/A;   CARDIAC CATHETERIZATION     CARDIAC DEFIBRILLATOR PLACEMENT  ~ 2000; ~ 2010   "I've had 4" (06/15/2017)   CARDIOVERSION  06/29/2016   Procedure: Cardioversion;  Surgeon: Deboraha Sprang, MD;  Location: [place] CV LAB;  Service: Cardiovascular;;   CARDIOVERSION N/A 06/17/2017   Procedure: CARDIOVERSION;  Surgeon: Jerline Pain, MD;  Location: [place];  Service: Cardiovascular;  Laterality: N/A;   CARDIOVERSION N/A 07/11/2019   Procedure: CARDIOVERSION;  Surgeon: Pixie Casino, MD;  Location: [place];  Service: Cardiovascular;  Laterality: N/A;   CARDIOVERSION N/A 01/03/2020   Procedure: CARDIOVERSION;  Surgeon: Jerline Pain, MD;   Location: [facility] ENDOSCOPY;  Service: Cardiovascular;  Laterality: N/A;   CARDIOVERSION N/A 06/28/2020   Procedure: CARDIOVERSION;  Surgeon: Donato Heinz, MD;  Location: [facility] ENDOSCOPY;  Service: Cardiovascular;  Laterality: N/A;   CARDIOVERSION N/A 08/09/2020   Procedure: CARDIOVERSION;  Surgeon: Larey Dresser, MD;  Location: [facility] ENDOSCOPY;  Service: Cardiovascular;  Laterality: N/A;   Guidant Vitality ICD Impantation--Guidant Vitality T135--11/13/2003     HYDROCELE EXCISION / REPAIR Left 04/2013   ICD GENERATOR CHANGEOUT N/A 06/29/2016   Procedure: ICD Generator Changeout;  Surgeon: Deboraha Sprang, MD;  Location: [place] CV LAB;  Service: Cardiovascular;  Laterality: N/A;   LAPAROSCOPIC CHOLECYSTECTOMY  2003   LITHOTRIPSY  X 1   RIGHT/LEFT HEART CATH AND CORONARY ANGIOGRAPHY N/A 02/14/2020   Procedure: RIGHT/LEFT HEART CATH AND CORONARY ANGIOGRAPHY;  Surgeon: Larey Dresser, MD;  Location: [place] CV LAB;  Service: Cardiovascular;  Laterality: N/A;   TEE WITHOUT CARDIOVERSION N/A 02/14/2020   Procedure: TRANSESOPHAGEAL ECHOCARDIOGRAM (TEE);  Surgeon: Larey Dresser, MD;  Location: [facility] ENDOSCOPY;  Service: Cardiovascular;  Laterality: N/A;    Current Outpatient Medications  Medication Sig Dispense Refill   acetaminophen (TYLENOL) 325 MG tablet Take 650 mg by mouth every 6 (six) hours as needed for mild pain or moderate pain (for pain.).     bisoprolol (ZEBETA) 5 MG tablet TAKE 1 TABLET (5 MG TOTAL) BY MOUTH IN THE MORNING AND AT BEDTIME 180 tablet 0   digoxin (LANOXIN) 0.125 MG tablet TAKE 1 TABLET BY MOUTH EVERY DAY 90 tablet 3   ELIQUIS 5 MG TABS tablet TAKE 1 TABLET (5 MG TOTAL) BY MOUTH IN THE MORNING AND AT BEDTIME 60 tablet 6   JARDIANCE 10 MG TABS tablet TAKE 1 TABLET BY MOUTH EVERY DAY BEFORE BREAKFAST (Patient taking differently: Waiting on approval from insurance) 90 tablet 3   loratadine (CLARITIN) 10 MG tablet Take 10 mg by mouth daily as needed (seasonal  allergies).     magnesium oxide (MAG-OX) 400 MG tablet TAKE 1 TABLET BY MOUTH EVERY DAY 30 tablet 11   rosuvastatin (CRESTOR) 10 MG tablet TAKE 1 TABLET BY MOUTH EVERY DAY 90 tablet 3   sacubitril-valsartan (ENTRESTO) 49-51 MG Take 1 tablet by mouth 2 (two) times daily. 180 tablet 3   spironolactone (ALDACTONE) 25 MG tablet TAKE 1 TABLET (25 MG TOTAL) BY MOUTH DAILY. 90 tablet 3   No current facility-administered medications for this visit.    No Known Allergies  Review of Systems negative except from HPI and PMH  Physical Exam: BP 123/86 (Patient Position: Standing)   Pulse 79   Ht 5' 7"$  (1.702 m)   Wt 178 lb 12.8 oz (81.1 kg)   SpO2 96%   BMI 28.00 kg/m  Well developed and well nourished in  no acute distress HENT normal Neck supple with JVP-flat Clear Device pocket well healed; without hematoma or erythema.  There is no tethering  Regular rate and rhythm, no murmur Abd-soft with active BS No Clubbing cyanosis  edema Skin-warm and dry A & Oriented  Grossly normal sensory and motor function  ECG atrial fibrillation with ventricular pacing at 83 with an upright QRS lead V1 and negative QRS lead I  Device function is  normal. Programming changes none  See Paceart for details    Assessment and plan Hypertrophic cardio myopathy with severe LV dysfunction EF 25-35%  MR-severe  Ventricular tachycardia with appropriate therapy   Proarrhythmic ventricular tachycardia secondary ranolazine  assoc w PTSD  LBBB//1AVB   Atrial Fibrillation-persistent long-term-recurrent  Inappropriate therapy  PVCs  Sleep disordered breathing and daytime somnolence  PTSD   ICD-CRT-[employer]    Atrial arrhythmias are permanent.  Repeated discussions with Dr. DM previously with Dr. Farrel Conners regarding AV junction ablation.  Will proceed, currently he is programmed at 80 to try to minimize loss of synchrony.  Will arrange consultation with Dr. Myles Gip and have discussed it briefly with him  today  Discussed the challenges of the shocks (PTSD)  triggered by the ranolazine proarrhythmia.  Offered him a visit with Dr. Michail Sermon  No bleeding, continue him on apixaban 5 twice daily  With his cardiomyopathy continue Entresto spironolactone and his bisoprolol.

## 2022-04-27 ENCOUNTER — Ambulatory Visit: Payer: BC Managed Care – PPO

## 2022-04-27 DIAGNOSIS — I422 Other hypertrophic cardiomyopathy: Secondary | ICD-10-CM

## 2022-04-28 LAB — CUP PACEART REMOTE DEVICE CHECK
Battery Remaining Longevity: 60 mo
Battery Remaining Percentage: 71 %
Brady Statistic RA Percent Paced: 0 %
Brady Statistic RV Percent Paced: 90 %
Date Time Interrogation Session: 20240226040100
HighPow Impedance: 45 Ohm
Implantable Lead Connection Status: 753985
Implantable Lead Connection Status: 753985
Implantable Lead Connection Status: 753985
Implantable Lead Implant Date: 20000815
Implantable Lead Implant Date: 20220401
Implantable Lead Implant Date: 20220401
Implantable Lead Location: 753858
Implantable Lead Location: 753859
Implantable Lead Location: 753860
Implantable Lead Model: 144
Implantable Lead Model: 7841
Implantable Lead Serial Number: 1129476
Implantable Lead Serial Number: 334759
Implantable Pulse Generator Implant Date: 20220401
Lead Channel Impedance Value: 601 Ohm
Lead Channel Impedance Value: 785 Ohm
Lead Channel Impedance Value: 995 Ohm
Lead Channel Pacing Threshold Amplitude: 0.8 V
Lead Channel Pacing Threshold Pulse Width: 0.4 ms
Lead Channel Setting Pacing Amplitude: 2 V
Lead Channel Setting Pacing Amplitude: 3 V
Lead Channel Setting Pacing Pulse Width: 0.4 ms
Lead Channel Setting Pacing Pulse Width: 0.5 ms
Lead Channel Setting Sensing Sensitivity: 0.5 mV
Lead Channel Setting Sensing Sensitivity: 1 mV
Pulse Gen Serial Number: 389080

## 2022-04-30 ENCOUNTER — Ambulatory Visit: Payer: BC Managed Care – PPO | Attending: Cardiovascular Disease | Admitting: Cardiovascular Disease

## 2022-04-30 ENCOUNTER — Encounter: Payer: Self-pay | Admitting: *Deleted

## 2022-04-30 ENCOUNTER — Encounter: Payer: Self-pay | Admitting: Cardiovascular Disease

## 2022-04-30 VITALS — BP 110/66 | HR 80 | Ht 67.0 in | Wt 180.0 lb

## 2022-04-30 DIAGNOSIS — I422 Other hypertrophic cardiomyopathy: Secondary | ICD-10-CM

## 2022-04-30 DIAGNOSIS — Z01812 Encounter for preprocedural laboratory examination: Secondary | ICD-10-CM

## 2022-04-30 DIAGNOSIS — I447 Left bundle-branch block, unspecified: Secondary | ICD-10-CM

## 2022-04-30 DIAGNOSIS — I4821 Permanent atrial fibrillation: Secondary | ICD-10-CM

## 2022-04-30 NOTE — Progress Notes (Signed)
Electrophysiology Office Note:    Date:  04/30/2022   ID:  Donald Bond, DOB 01/30/1961, MRN [id number]  PCP:  Medicine, [place] Providers Cardiologist:  None Electrophysiologist:  Virl Axe, MD     Referring MD: Medicine, [facility]*   History of Present Illness:    Donald Bond is a 62 y.o. male with a hx listed below, significant for hypertrophic cardiomyopathy, CHF (EF 25-35%, [employer] BiV ICD, permanent atrial fibrillation, severe MR, referred by Dr. Caryl Comes for arrhythmia management.  CRT pacing has been suboptimal at times due to atrial fibrillation.  He has had atrial fibrillation for several years has been determined not to be a candidate for rhythm control at this point.  He is referred for AVJ ablation.  Today he reports he is at baseline with mild shortness of breath.  Past Medical History:  Diagnosis Date   AICD (automatic cardioverter/defibrillator) present X 4   CHF (congestive heart failure) (HCC)    Chronic combined systolic and diastolic congestive heart failure ([place])    Headache    "weekly" (06/15/2017)   History of kidney stones    Hypertr obst cardiomyop    ICD--[employer]    Dr. Caryl Comes   Mitral regurgitation    Orthostatic lightheadedness    Persistent atrial fibrillation ([place])    Severe mitral valve regurgitation by Echo 11/24/19 11/26/2019   SYNCOPE    TIREDNESS    Tricuspid regurgitation    Ventricular fibrillation/polymorphic ventricular tachycardia 06/04/2011   Ventricular tachycardia ([place])    rx via ICD    Past Surgical History:  Procedure Laterality Date   BIV ICD GENERATOR CHANGEOUT N/A 05/31/2020   Procedure: BIV ICD GENERATOR UPGRADE;  Surgeon: Deboraha Sprang, MD;  Location: [place] CV LAB;  Service: Cardiovascular;  Laterality: N/A;   CARDIAC CATHETERIZATION     CARDIAC DEFIBRILLATOR PLACEMENT  ~ 2000; ~ 2010   "I've had 4" (06/15/2017)   CARDIOVERSION  06/29/2016   Procedure:  Cardioversion;  Surgeon: Deboraha Sprang, MD;  Location: [place] CV LAB;  Service: Cardiovascular;;   CARDIOVERSION N/A 06/17/2017   Procedure: CARDIOVERSION;  Surgeon: Jerline Pain, MD;  Location: [place];  Service: Cardiovascular;  Laterality: N/A;   CARDIOVERSION N/A 07/11/2019   Procedure: CARDIOVERSION;  Surgeon: Pixie Casino, MD;  Location: [place];  Service: Cardiovascular;  Laterality: N/A;   CARDIOVERSION N/A 01/03/2020   Procedure: CARDIOVERSION;  Surgeon: Jerline Pain, MD;  Location: [facility] ENDOSCOPY;  Service: Cardiovascular;  Laterality: N/A;   CARDIOVERSION N/A 06/28/2020   Procedure: CARDIOVERSION;  Surgeon: Donato Heinz, MD;  Location: [facility] ENDOSCOPY;  Service: Cardiovascular;  Laterality: N/A;   CARDIOVERSION N/A 08/09/2020   Procedure: CARDIOVERSION;  Surgeon: Larey Dresser, MD;  Location: [facility] ENDOSCOPY;  Service: Cardiovascular;  Laterality: N/A;   Guidant Vitality ICD Impantation--Guidant Vitality T135--11/13/2003     HYDROCELE EXCISION / REPAIR Left 04/2013   ICD GENERATOR CHANGEOUT N/A 06/29/2016   Procedure: ICD Generator Changeout;  Surgeon: Deboraha Sprang, MD;  Location: [place] CV LAB;  Service: Cardiovascular;  Laterality: N/A;   LAPAROSCOPIC CHOLECYSTECTOMY  2003   LITHOTRIPSY  X 1   RIGHT/LEFT HEART CATH AND CORONARY ANGIOGRAPHY N/A 02/14/2020   Procedure: RIGHT/LEFT HEART CATH AND CORONARY ANGIOGRAPHY;  Surgeon: Larey Dresser, MD;  Location: [place] CV LAB;  Service: Cardiovascular;  Laterality: N/A;   TEE WITHOUT CARDIOVERSION N/A 02/14/2020   Procedure: TRANSESOPHAGEAL ECHOCARDIOGRAM (TEE);  Surgeon: Larey Dresser, MD;  Location: [facility] ENDOSCOPY;  Service: Cardiovascular;  Laterality: N/A;    Current Medications: Current Meds  Medication Sig   acetaminophen (TYLENOL) 325 MG tablet Take 650 mg by mouth every 6 (six) hours as needed for mild pain or moderate pain (for pain.).   bisoprolol (ZEBETA) 5 MG tablet TAKE 1 TABLET (5  MG TOTAL) BY MOUTH IN THE MORNING AND AT BEDTIME   digoxin (LANOXIN) 0.125 MG tablet TAKE 1 TABLET BY MOUTH EVERY DAY   ELIQUIS 5 MG TABS tablet TAKE 1 TABLET (5 MG TOTAL) BY MOUTH IN THE MORNING AND AT BEDTIME   JARDIANCE 10 MG TABS tablet TAKE 1 TABLET BY MOUTH EVERY DAY BEFORE BREAKFAST (Patient taking differently: Waiting on approval from insurance)   loratadine (CLARITIN) 10 MG tablet Take 10 mg by mouth daily as needed (seasonal allergies).   magnesium oxide (MAG-OX) 400 MG tablet TAKE 1 TABLET BY MOUTH EVERY DAY   rosuvastatin (CRESTOR) 10 MG tablet TAKE 1 TABLET BY MOUTH EVERY DAY   sacubitril-valsartan (ENTRESTO) 49-51 MG Take 1 tablet by mouth 2 (two) times daily.   spironolactone (ALDACTONE) 25 MG tablet TAKE 1 TABLET (25 MG TOTAL) BY MOUTH DAILY.     Allergies:   Patient has no known allergies.   Social History   Socioeconomic History   Marital status: Married    Spouse name: Not on file   Number of children: Not on file   Years of education: Not on file   Highest education level: Not on file  Occupational History   Not on file  Tobacco Use   Smoking status: Never   Smokeless tobacco: Former    Types: Chew    Quit date: 1995  Vaping Use   Vaping Use: Never used  Substance and Sexual Activity   Alcohol use: Yes    Alcohol/week: 3.0 standard drinks of alcohol    Types: 3 Cans of beer per week   Drug use: No   Sexual activity: Yes  Other Topics Concern   Not on file  Social History Narrative   Lives with spouse in [place]   employed   Social Determinants of Health   Financial Resource Strain: Not on file  Food Insecurity: Not on file  Transportation Needs: Not on file  Physical Activity: Not on file  Stress: Not on file  Social Connections: Not on file     Family History: The patient's family history includes ALS in his father; Heart attack in his brother; Hypertension in an other family member.  ROS:   Please see the history of present illness.     All other systems reviewed and are negative.  EKGs/Labs/Other Studies Reviewed Today:    EKG:  Last EKG results: Today - AF with BiV pacing   Recent Labs: 02/05/2022: BUN 15; Creatinine, Ser 0.91; Hemoglobin 16.8; Platelets 165; Potassium 4.0; Sodium 137     Physical Exam:    VS:  BP 110/66   Pulse 80   Ht '5\' 7"'$  (1.702 m)   Wt 180 lb (81.6 kg)   SpO2 96%   BMI 28.19 kg/m     Wt Readings from Last 3 Encounters:  04/30/22 180 lb (81.6 kg)  04/20/22 178 lb 12.8 oz (81.1 kg)  02/05/22 177 lb 6.4 oz (80.5 kg)     GEN: Well nourished, well developed in no acute distress CARDIAC: RRR, no murmurs, rubs, gallops RESPIRATORY:  Normal work of breathing MUSCULOSKELETAL: trace edema  ASSESSMENT & PLAN:    CHFrEF: Permanent AF, often with uncontrolled rates, has received inappropriate therapy. We discussed the indication, rationale, logistics, anticipated benefits, and potential risks of the AVJ ablation procedure including but not limited to -- bleed at the groin access site, chest pain, damage to nearby organs, need for a drainage tube, or prolonged hospitalization. I explained that the risk for stroke, heart attack, need for open chest surgery, or even death is very low but not zero. he  expressed understanding and wishes to proceed. LBBB: should have increased CRT pacing burden after AVJ [employer] BiV ICD - normal function        Medication Adjustments/Labs and Tests Ordered: Current medicines are reviewed at length with the patient today.  Concerns regarding medicines are outlined above.  No orders of the defined types were placed in this encounter.  No orders of the defined types were placed in this encounter.    Signed, Melida Quitter, MD  04/30/2022 11:46 AM    [place]

## 2022-04-30 NOTE — Patient Instructions (Addendum)
Medication Instructions:  Your physician recommends that you continue on your current medications as directed. Please refer to the Current Medication list given to you today.  *If you need a refill on your cardiac medications before your next appointment, please call your pharmacy*   Lab Work: None ordered If you have labs (blood work) drawn today and your tests are completely normal, you will receive your results only by: [place] (if you have MyChart) OR A paper copy in the mail If you have any lab test that is abnormal or we need to change your treatment, we will call you to review the results.   Testing/Procedures: Your physician has recommended that you have an ablation. Catheter ablation is a medical procedure used to treat some cardiac arrhythmias (irregular heartbeats). During catheter ablation, a long, thin, flexible tube is put into a blood vessel in your groin (upper thigh), or neck. This tube is called an ablation catheter. It is then guided to your heart through the blood vessel. Radio frequency waves destroy small areas of heart tissue where abnormal heartbeats may cause an arrhythmia to start. Please see the instruction sheet given to you today.   Follow-Up: At [facility], you and your health needs are our priority.  As part of our continuing mission to provide you with exceptional heart care, we have created designated Provider Care Teams.  These Care Teams include your primary Cardiologist (physician) and Advanced Practice Providers (APPs -  Physician Assistants and Nurse Practitioners) who all work together to provide you with the care you need, when you need it.  Your next appointment:   4 week(s) after your ablation on 07/02/22  The format for your next appointment:   In Person  Provider:   Doralee Albino, MD{    Thank you for choosing [facility]!!   [phone]  Other Instructions  Cardiac Ablation Cardiac ablation is a procedure to  destroy, or ablate, a small amount of heart tissue that is causing problems. The heart has many electrical connections. Sometimes, these connections are abnormal and can cause the heart to beat very fast or irregularly. Ablating the abnormal areas can improve the heart's rhythm or return it to normal. Ablation may be done for people who: Have irregular or rapid heartbeats (arrhythmias). Have Wolff-Parkinson-White syndrome. Have taken medicines for an arrhythmia that did not work or caused side effects. Have a high-risk heartbeat that may be life-threatening. Tell a health care provider about: Any allergies you have. All medicines you are taking, including vitamins, herbs, eye drops, creams, and over-the-counter medicines. Any problems you or family members have had with anesthesia. Any bleeding problems you have. Any surgeries you have had. Any medical conditions you have. Whether you are pregnant or may be pregnant. What are the risks? Your health care provider will talk with you about risks. These may include: Infection. Bruising and bleeding. Stroke or blood clots. Damage to nearby structures or organs. Allergic reaction to medicines or dyes. Needing a pacemaker if the heart gets damaged. A pacemaker is a device that helps the heart beat normally. Failure of the procedure. A repeat procedure may be needed. What happens before the procedure? Medicines Ask your health care provider about: Changing or stopping your regular medicines. These include any heart rhythm medicines, diabetes medicines, or blood thinners you take. Taking medicines such as aspirin and ibuprofen. These medicines can thin your blood. Do not take them unless your health care provider tells you to. Taking over-the-counter medicines, vitamins, herbs,  and supplements. General instructions Follow instructions from your health care provider about what you may eat and drink. If you will be going home right after the  procedure, plan to have a responsible adult: Take you home from the hospital or clinic. You will not be allowed to drive. Care for you for the time you are told. Ask your health care provider what steps will be taken to prevent infection. What happens during the procedure?  An IV will be inserted into one of your veins. You may be given: A sedative. This helps you relax. Anesthesia. This will: Numb certain areas of your body. An incision will be made in your neck or your groin. A needle will be inserted through the incision and into a large vein in your neck or groin. The small, thin tube (catheter) will be inserted through the needle and moved to your heart. A type of X-ray (fluoroscopy) will be used to help guide the catheter and provide images of the heart on a monitor. Dye may be injected through the catheter to help your surgeon see the area of the heart that needs treatment. Electrical currents will be sent from the catheter to destroy heart tissue in certain areas. There are three types of energy that may be used to do this: Heat (radiofrequency energy). Laser energy. Extreme cold (cryoablation). When the tissue has been destroyed, the catheter will be removed. Pressure will be held on the insertion area to prevent bleeding. A bandage (dressing) will be placed over the insertion area. The procedure may vary among health care providers and hospitals. What happens after the procedure? Your blood pressure, heart rate and rhythm, breathing rate, and blood oxygen level will be monitored until you leave the hospital or clinic. Your insertion area will be checked for bleeding. You will need to lie still for a few hours. If your groin was used, you will need to keep your leg straight for a few hours after the catheter is removed. This information is not intended to replace advice given to you by your health care provider. Make sure you discuss any questions you have with your health care  provider. Document Revised: 08/05/2021 Document Reviewed: 08/05/2021 Elsevier Patient Education  [place].

## 2022-05-04 IMAGING — DX DG CHEST 1V PORT
1 series · 1 of 1 positions shown · non-contrast
Comparison: 11/22/2019

CLINICAL DATA: Fall

EXAM:
PORTABLE CHEST 1 VIEW

[chest ap]
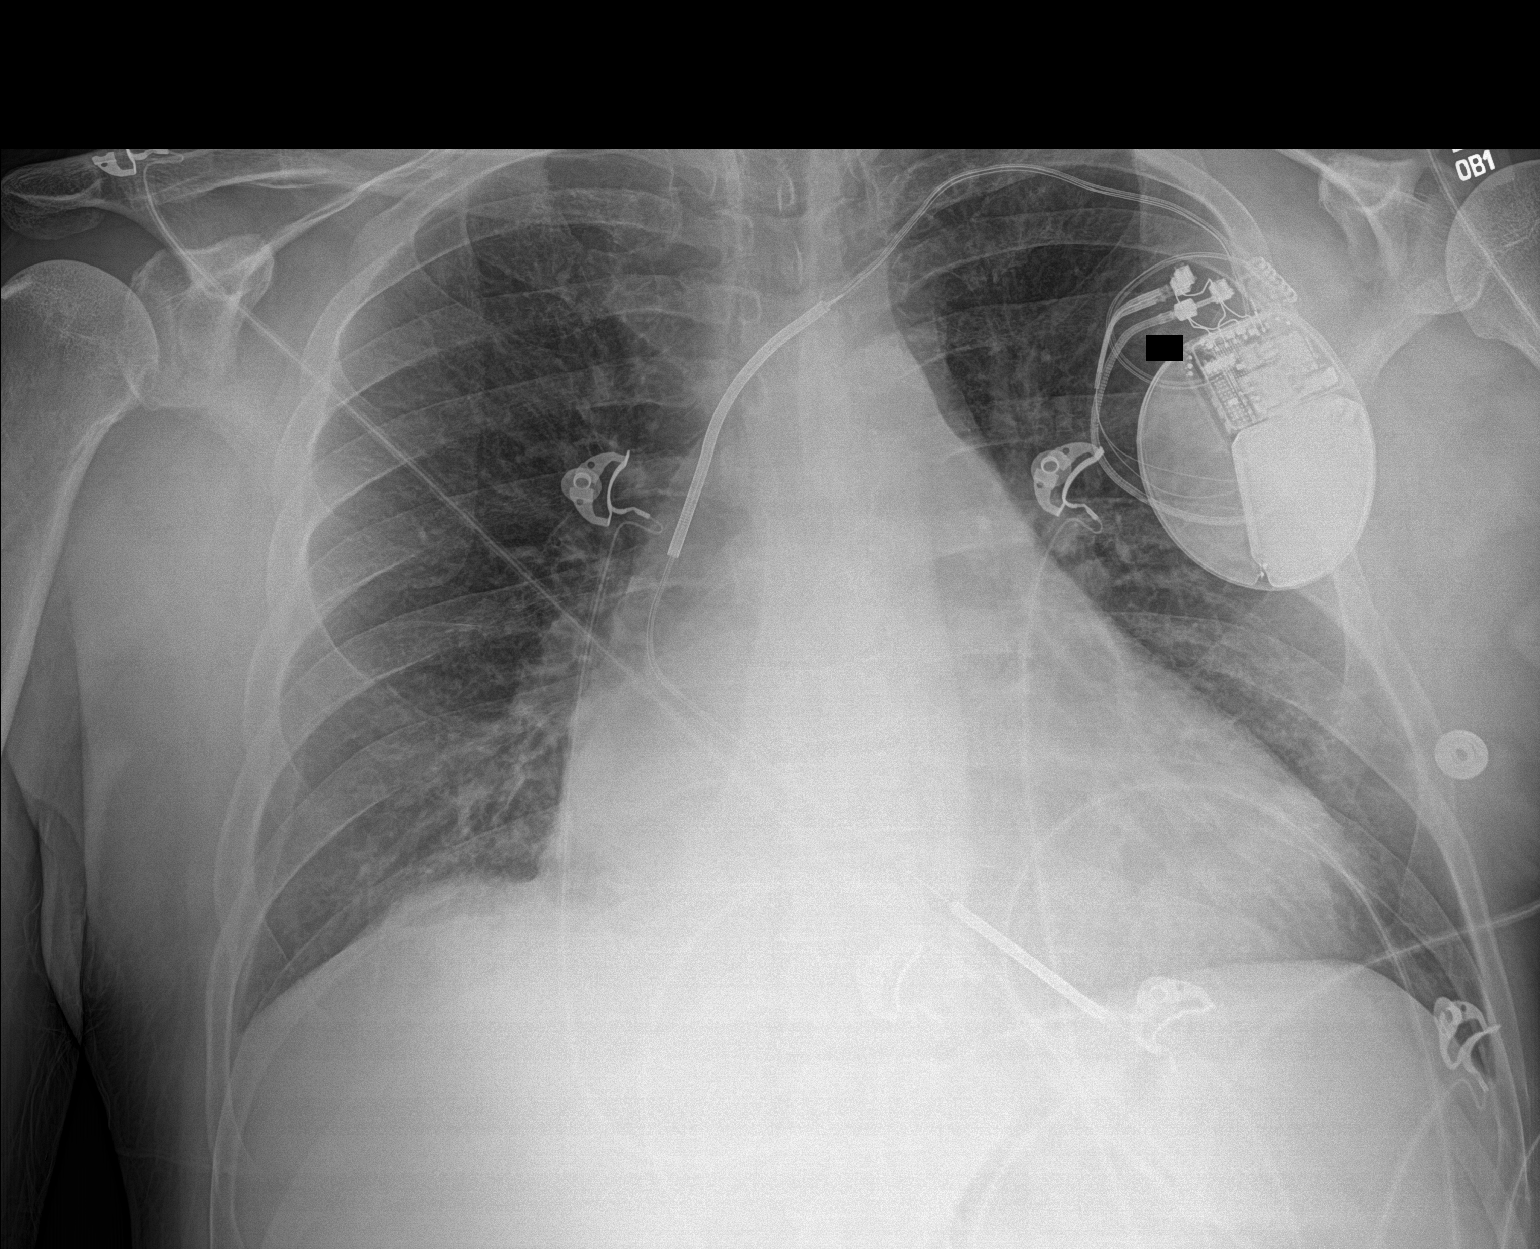

[1 of 1 positions shown; findings below may reference images not displayed]

FINDINGS: Left AICD remains in place, unchanged. Cardiomegaly. No confluent
opacities, effusions or edema. No acute bony abnormality.
IMPRESSION: Cardiomegaly.  No active disease.

## 2022-05-04 IMAGING — CT CT HEAD W/O CM
4 series · 16 of 47 positions shown, 18 images · non-contrast
Comparison: None.

CLINICAL DATA: Trauma

EXAM:
CT HEAD WITHOUT CONTRAST
CT CERVICAL SPINE WITHOUT CONTRAST
TECHNIQUE: Multidetector CT imaging of the head and cervical spine was
performed following the standard protocol without intravenous
contrast. Multiplanar CT image reconstructions of the cervical spine
were also generated.

[Series 3: head without · axial · non-contrast · 0.47mm/px · z∈[-77,+58]mm · 7 of 37 slices shown, 9 images]
[im 5/37  brain]
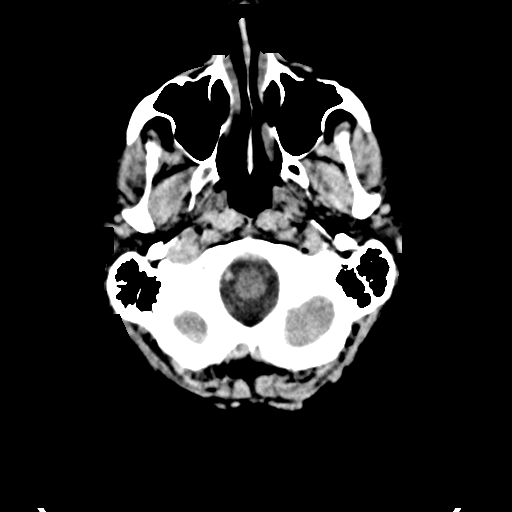
[im 5/37  bone]
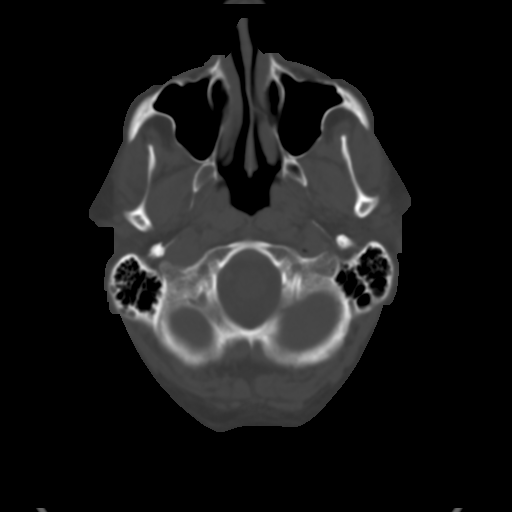
[im 10/37  brain]
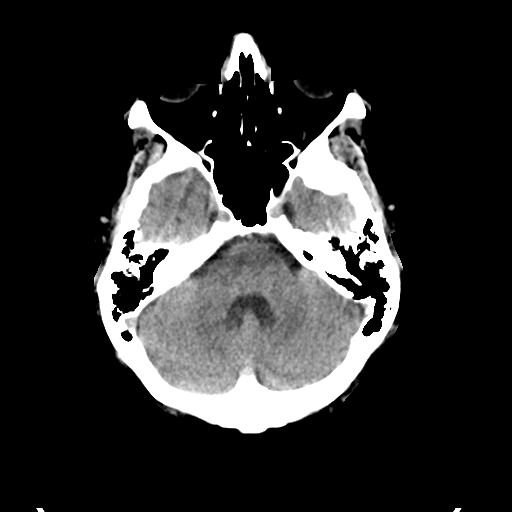
[im 14/37  brain]
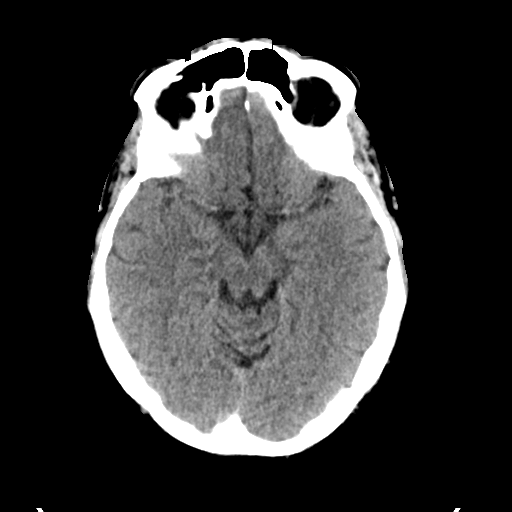
[im 19/37  brain]
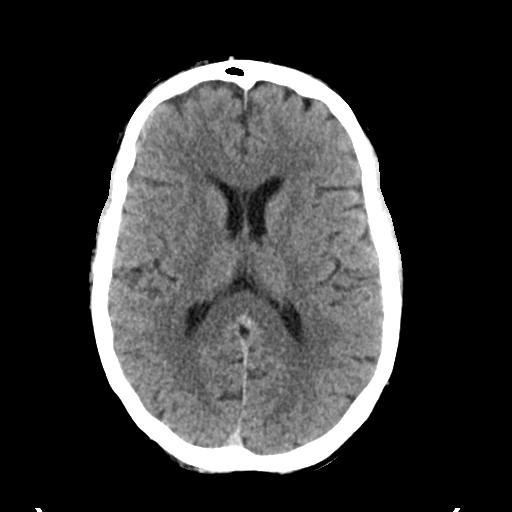
[im 23/37  brain]
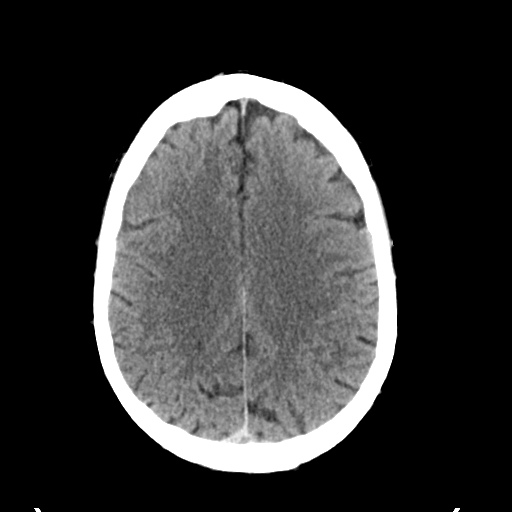
[im 23/37  bone]
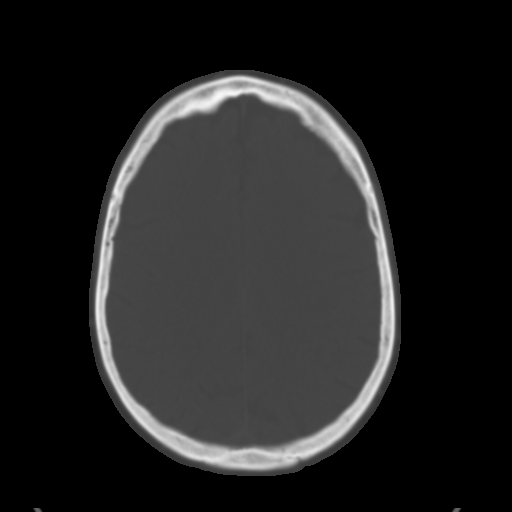
[im 28/37  brain]
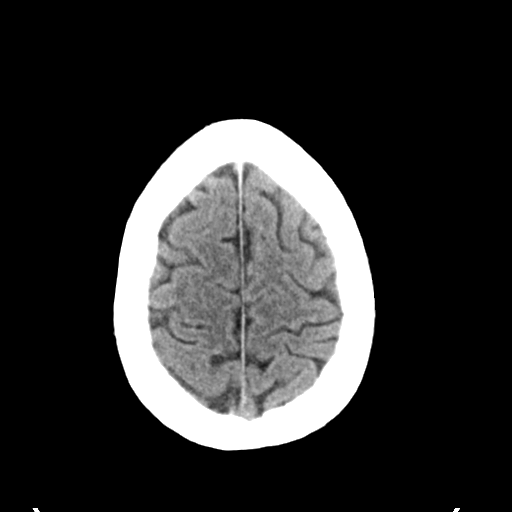
[im 32/37  brain]
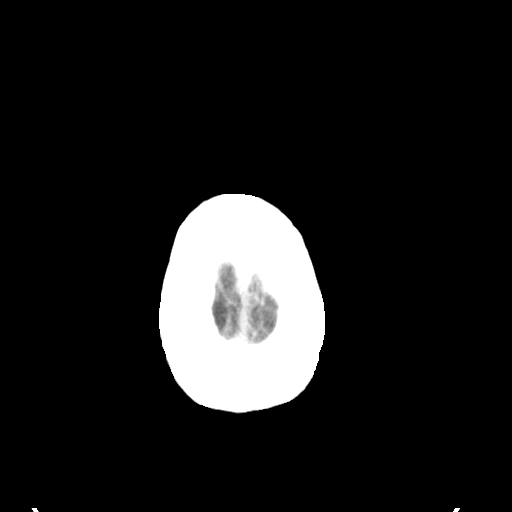

[Series 4: head bone · axial · 0.47mm/px · z∈[-79,-43]mm · 3 of 91 slices shown]
[im 10/91  bone]
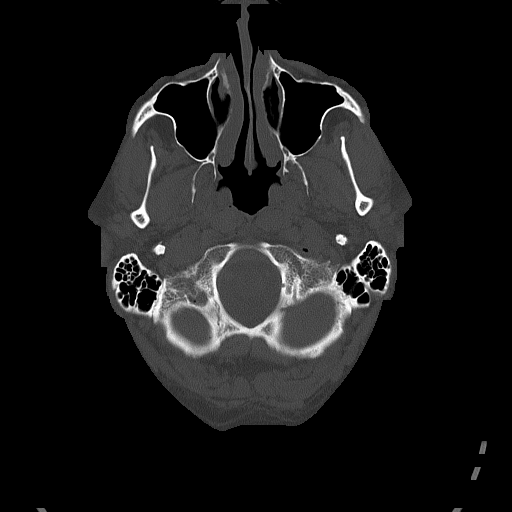
[im 19/91  bone]
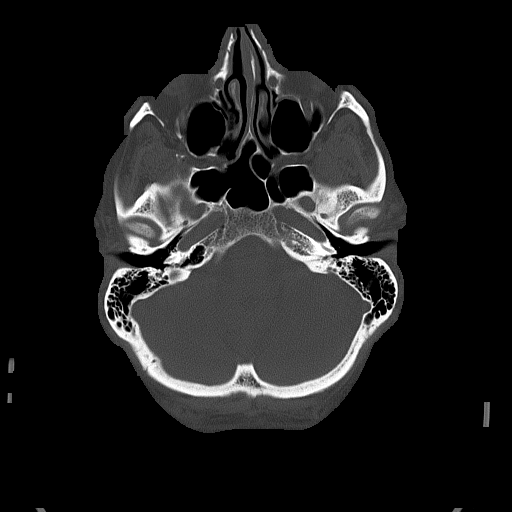
[im 28/91  bone]
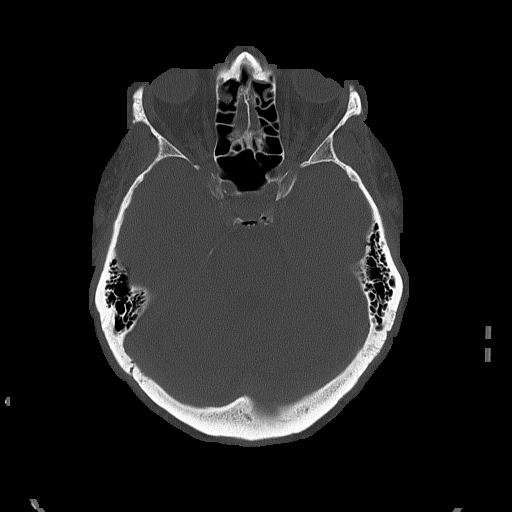

[Series 5: head without cor · coronal · non-contrast · 0.35mm/px · 3 of 76 slices shown]
[im 26/76  brain]
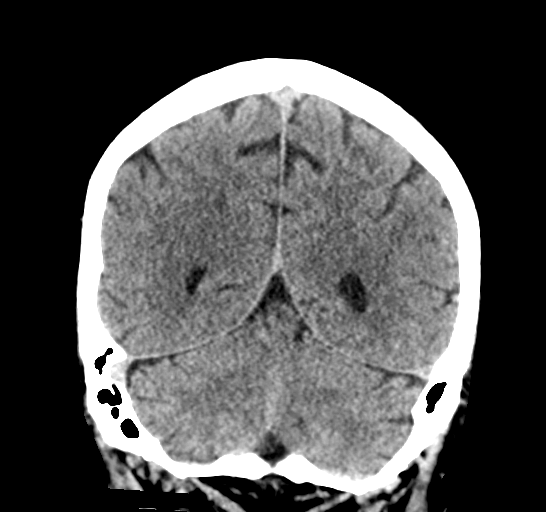
[im 34/76  brain]
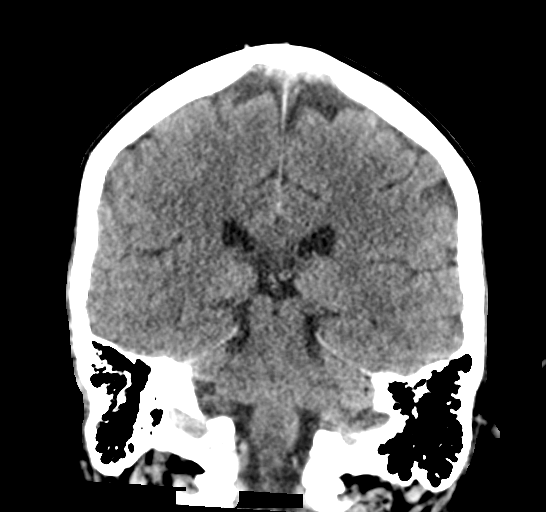
[im 42/76  brain]
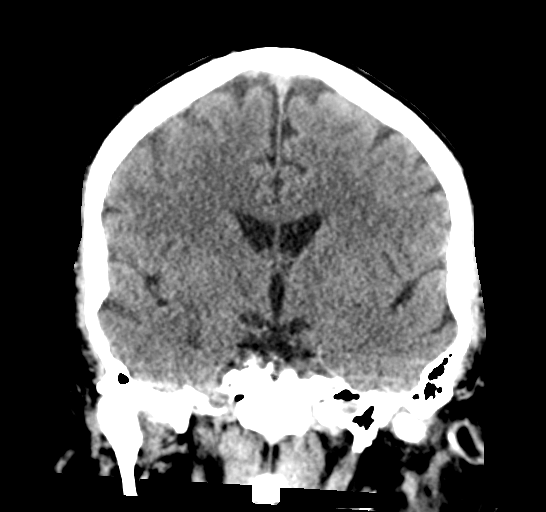

[Series 6: head without sag · sagittal · non-contrast · 0.35mm/px · 3 of 63 slices shown]
[im 21/63  brain]
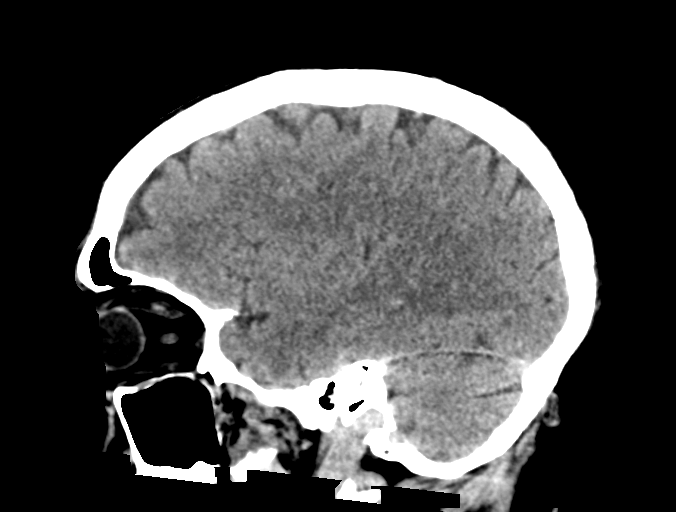
[im 32/63  brain]
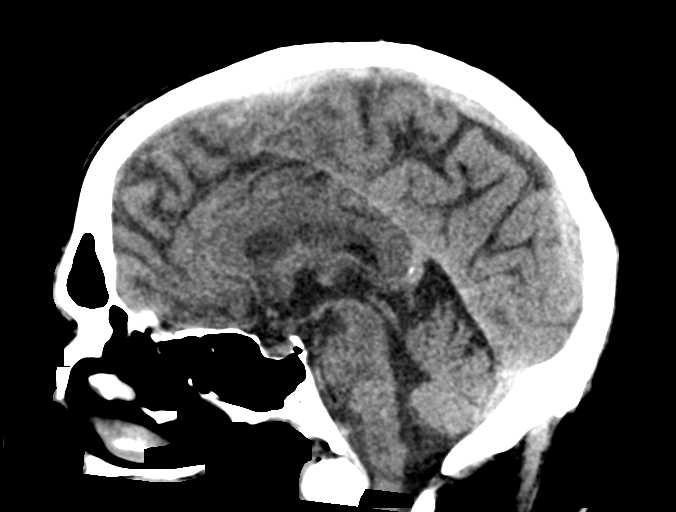
[im 42/63  brain]
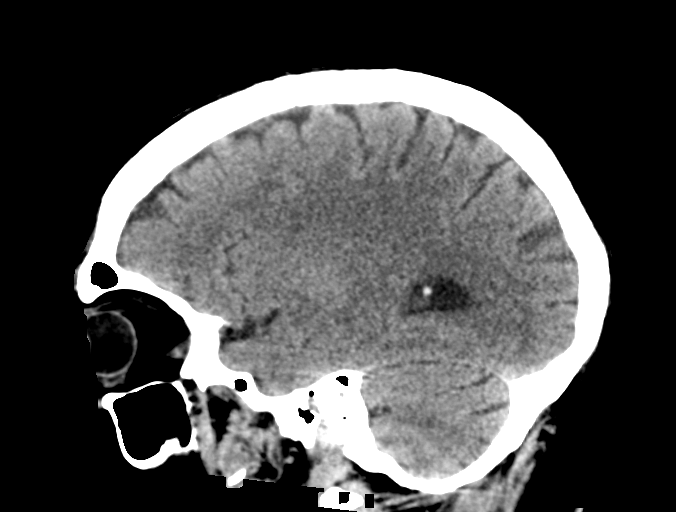

[16 of 47 positions shown; findings below may reference images not displayed]

FINDINGS: CT HEAD FINDINGS

Brain: No acute infarct or intracranial hemorrhage. No mass lesion.
No midline shift, ventriculomegaly or extra-axial fluid collection.

Vascular: No hyperdense vessel or unexpected calcification.

Skull: No definite fracture.  No focal lesion.

Sinuses/Orbits: Normal orbits. Clear paranasal sinuses. No mastoid
effusion.

Other: Tiny scattered foci of subcutaneous emphysema within the
right face and intraorbital soft tissues.

CT CERVICAL SPINE FINDINGS

Alignment: Normal.

Skull base and vertebrae: No acute fracture. No primary bone lesion
or focal pathologic process.

Soft tissues and spinal canal: No prevertebral fluid or swelling. No
visible canal hematoma.

Disc levels: No significant spinal canal or neural foraminal
narrowing.

Upper chest: Partially imaged bilateral pleural effusions. Dependent
atelectasis.

Other: None.
IMPRESSION: HEAD CT:

No acute intracranial process.

Tiny scatter right facial and intraorbital foci of subcutaneous
emphysema. No definite fracture identified.

Consider maxillofacial CT for further evaluation if suspicion
persists.

CT CERVICAL SPINE :

No fracture or traumatic listhesis.

## 2022-05-04 IMAGING — CT CT CERVICAL SPINE W/O CM
3 of 4 series · 13 of 33 positions shown, 16 images · non-contrast
Comparison: None.

CLINICAL DATA: Trauma

EXAM:
CT HEAD WITHOUT CONTRAST
CT CERVICAL SPINE WITHOUT CONTRAST
TECHNIQUE: Multidetector CT imaging of the head and cervical spine was
performed following the standard protocol without intravenous
contrast. Multiplanar CT image reconstructions of the cervical spine
were also generated.

[Series 4: c_spine 2.0 (person_name) (person_name) · axial · 0.46mm/px · z∈[-246,-112]mm · 5 of 101 slices shown, 7 images]
[im 17/101  soft-tissue]
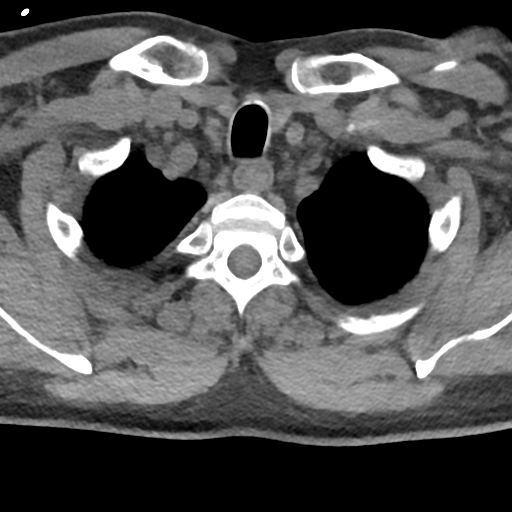
[im 17/101  bone]
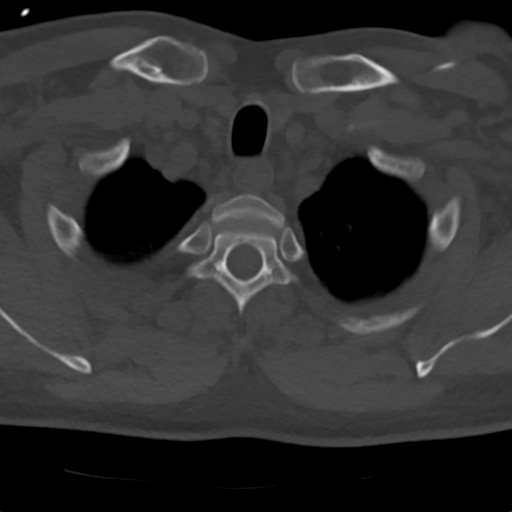
[im 34/101  bone]
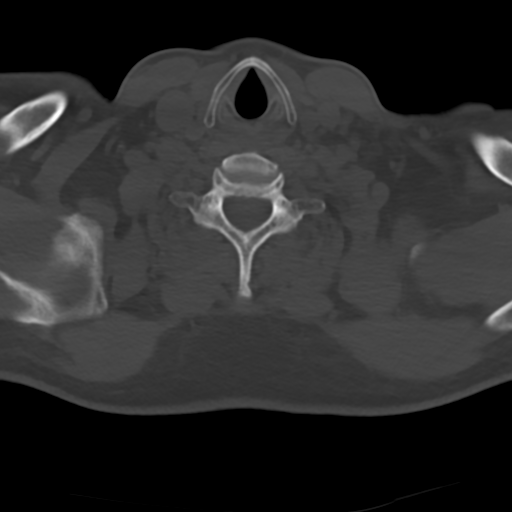
[im 51/101  bone]
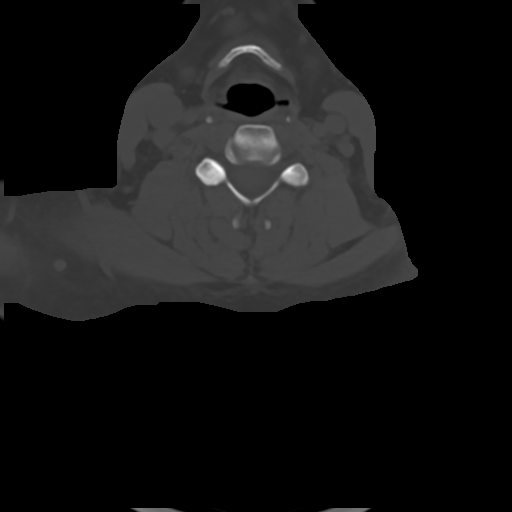
[im 67/101  bone]
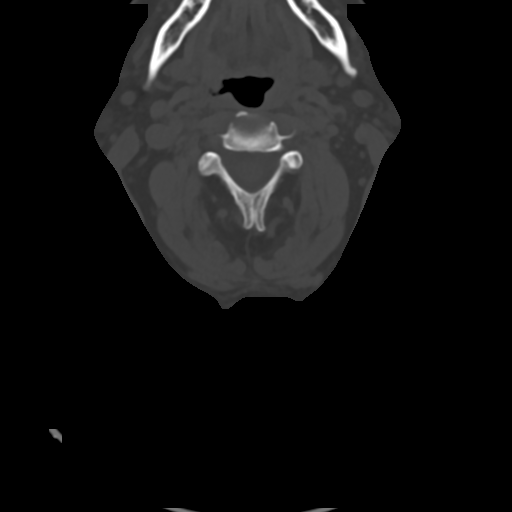
[im 84/101  soft-tissue]
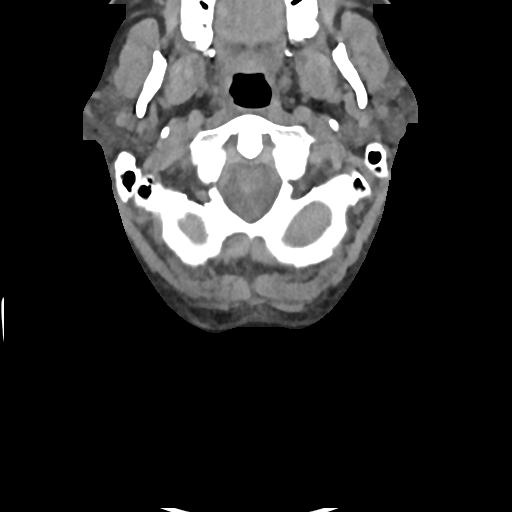
[im 84/101  bone]
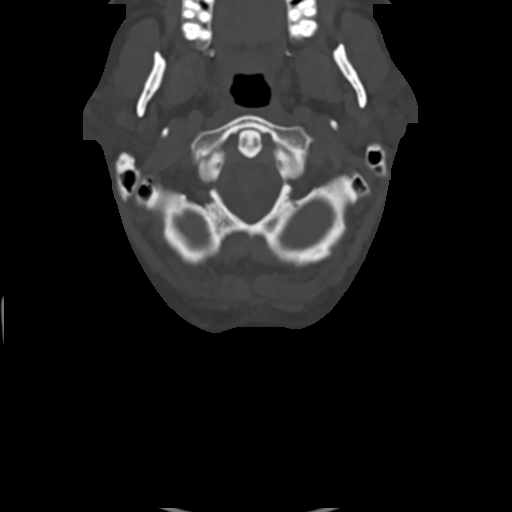

[Series 9: c_spine 2.0 sag bone · sagittal · 0.35mm/px · 5 of 72 slices shown, 6 images]
[im 24/72  bone]
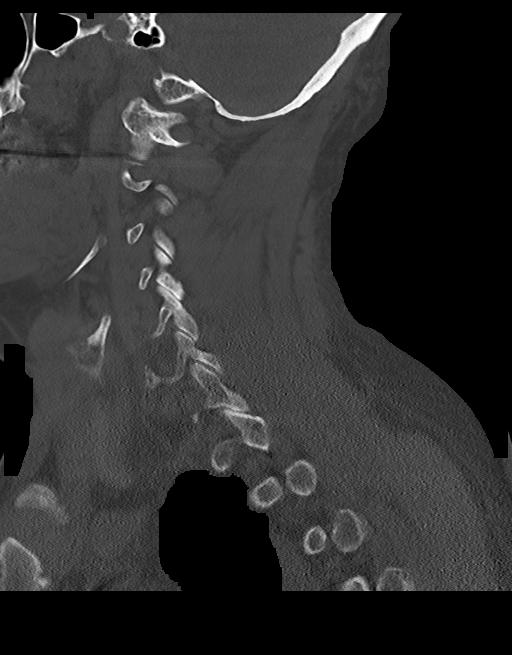
[im 30/72  bone]
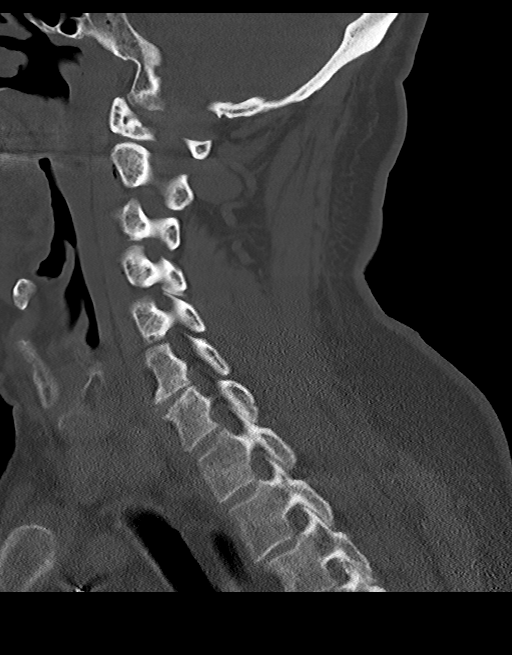
[im 36/72  soft-tissue]
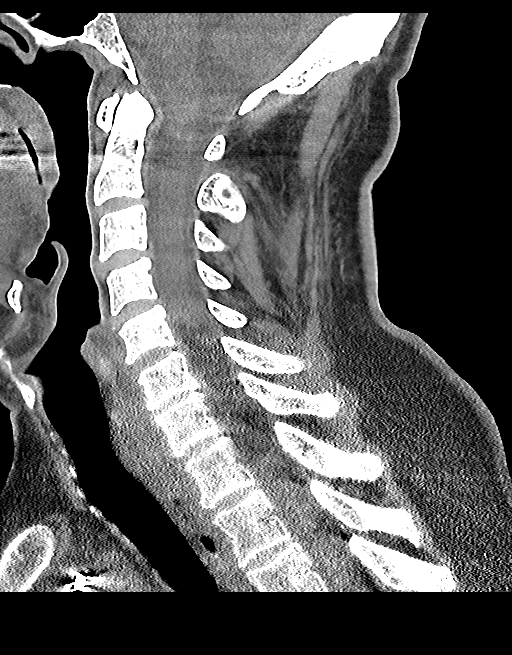
[im 36/72  bone]
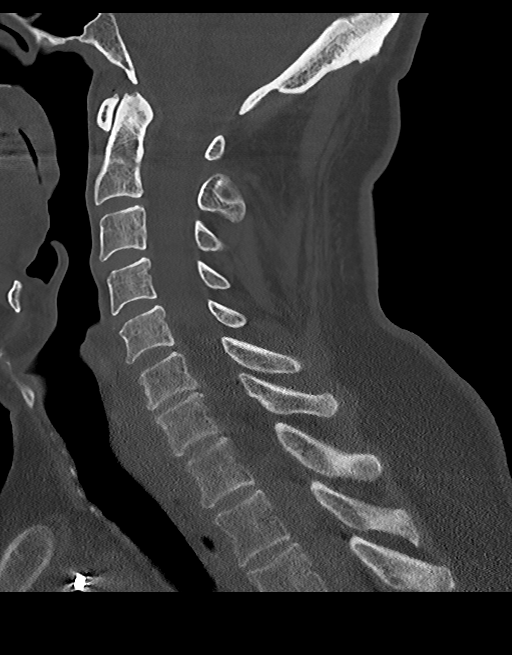
[im 42/72  bone]
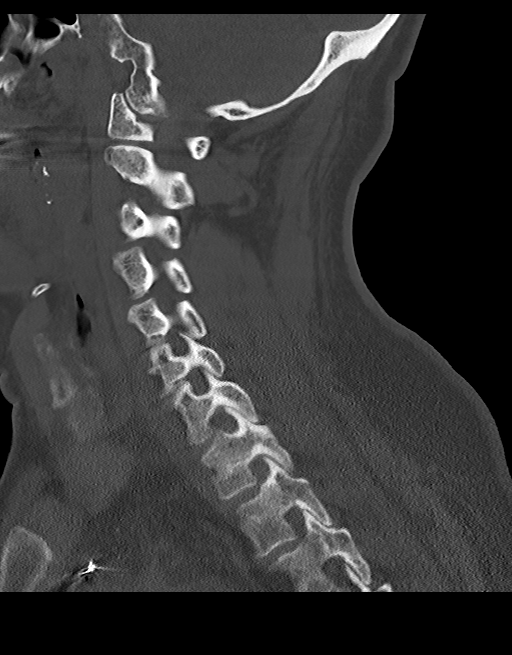
[im 48/72  bone]
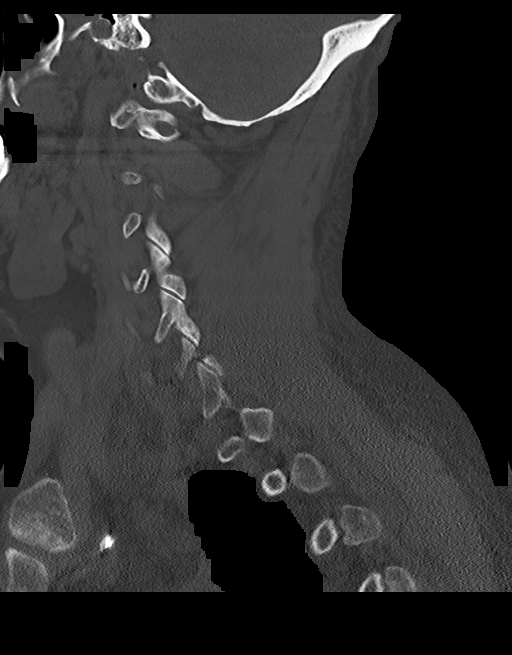

[Series 10: c_spine 2.0 cor bone · coronal · 0.29mm/px · 3 of 82 slices shown]
[im 17/82  bone]
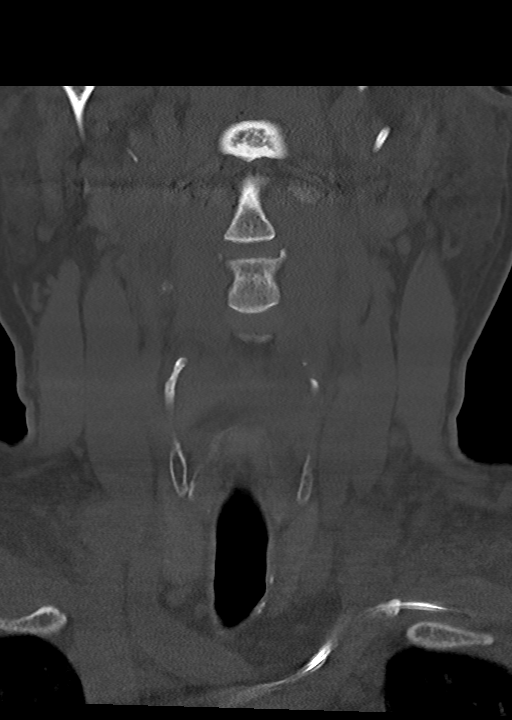
[im 33/82  bone]
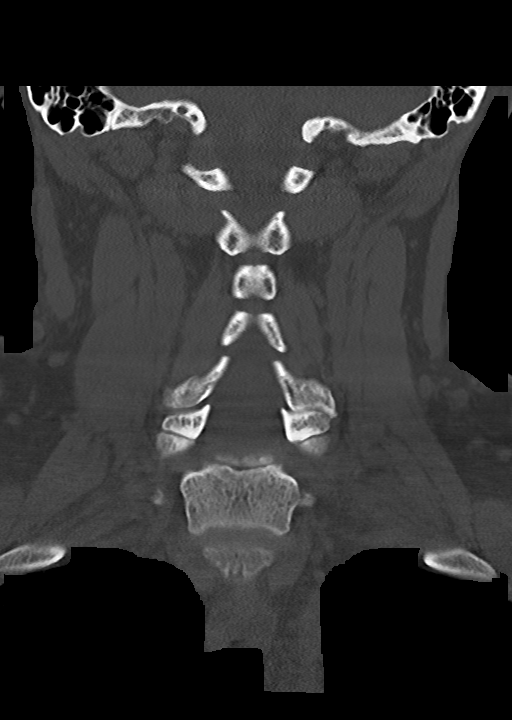
[im 49/82  bone]
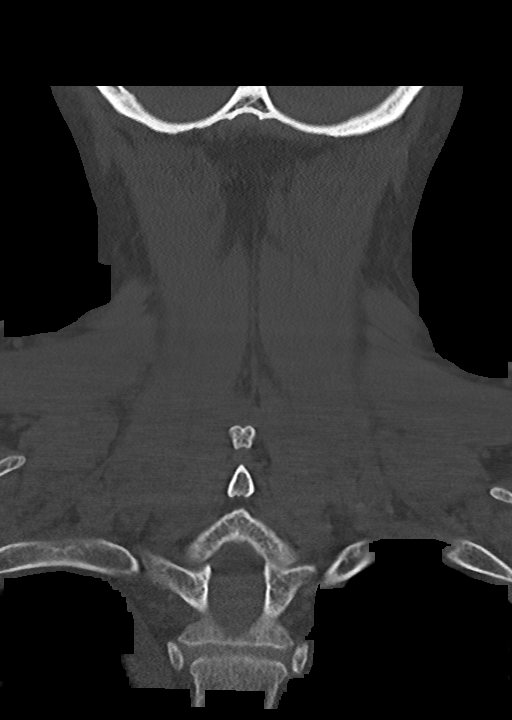

[13 of 33 positions shown; findings below may reference images not displayed]

FINDINGS: CT HEAD FINDINGS

Brain: No acute infarct or intracranial hemorrhage. No mass lesion.
No midline shift, ventriculomegaly or extra-axial fluid collection.

Vascular: No hyperdense vessel or unexpected calcification.

Skull: No definite fracture.  No focal lesion.

Sinuses/Orbits: Normal orbits. Clear paranasal sinuses. No mastoid
effusion.

Other: Tiny scattered foci of subcutaneous emphysema within the
right face and intraorbital soft tissues.

CT CERVICAL SPINE FINDINGS

Alignment: Normal.

Skull base and vertebrae: No acute fracture. No primary bone lesion
or focal pathologic process.

Soft tissues and spinal canal: No prevertebral fluid or swelling. No
visible canal hematoma.

Disc levels: No significant spinal canal or neural foraminal
narrowing.

Upper chest: Partially imaged bilateral pleural effusions. Dependent
atelectasis.

Other: None.
IMPRESSION: HEAD CT:

No acute intracranial process.

Tiny scatter right facial and intraorbital foci of subcutaneous
emphysema. No definite fracture identified.

Consider maxillofacial CT for further evaluation if suspicion
persists.

CT CERVICAL SPINE :

No fracture or traumatic listhesis.

## 2022-05-28 ENCOUNTER — Other Ambulatory Visit (HOSPITAL_COMMUNITY): Payer: Self-pay

## 2022-06-02 ENCOUNTER — Other Ambulatory Visit: Payer: Self-pay | Admitting: Internal Medicine

## 2022-06-05 ENCOUNTER — Telehealth (HOSPITAL_COMMUNITY): Payer: Self-pay | Admitting: Pharmacy Technician

## 2022-06-05 ENCOUNTER — Other Ambulatory Visit (HOSPITAL_COMMUNITY): Payer: Self-pay

## 2022-06-05 NOTE — Telephone Encounter (Signed)
Patient Advocate Encounter   Received notification from [place] that prior authorization for Eliquis is required.   PA submitted on CoverMyMeds Key [id number] Status is pending   Will continue to follow.

## 2022-06-05 NOTE — Telephone Encounter (Signed)
Patient Advocate Encounter   Received notification from [place] that prior authorization for Donald Bond is required.   PA submitted on CoverMyMeds Key [id number] Status is pending   Will continue to follow.

## 2022-06-05 NOTE — Telephone Encounter (Signed)
Advanced Heart Failure Patient Advocate Encounter  Prior Authorization for [place] has been approved.    PA# [id number] Effective dates: 06/05/22 through 03/01/2098  Archer Asa, CPhT

## 2022-06-05 NOTE — Progress Notes (Signed)
Remote ICD transmission.   

## 2022-06-05 NOTE — Telephone Encounter (Signed)
Advanced Heart Failure Patient Advocate Encounter  Prior Authorization for Eliquis has been approved.    PA# [id number] Effective dates: 06/05/22 through 06/05/23  Archer Asa, CPhT

## 2022-06-15 ENCOUNTER — Ambulatory Visit: Payer: Managed Care, Other (non HMO) | Attending: Cardiovascular Disease

## 2022-06-15 DIAGNOSIS — Z01812 Encounter for preprocedural laboratory examination: Secondary | ICD-10-CM

## 2022-06-15 DIAGNOSIS — I4821 Permanent atrial fibrillation: Secondary | ICD-10-CM

## 2022-06-16 LAB — BASIC METABOLIC PANEL
BUN/Creatinine Ratio: 16 (ref 10–24)
BUN: 16 mg/dL (ref 8–27)
CO2: 21 mmol/L (ref 20–29)
Calcium: 9.8 mg/dL (ref 8.6–10.2)
Chloride: 104 mmol/L (ref 96–106)
Creatinine, Ser: 0.99 mg/dL (ref 0.76–1.27)
Glucose: 78 mg/dL (ref 70–99)
Potassium: 4.4 mmol/L (ref 3.5–5.2)
Sodium: 139 mmol/L (ref 134–144)
eGFR: 87 mL/min/{1.73_m2} (ref >59)

## 2022-06-16 LAB — CBC

## 2022-06-30 ENCOUNTER — Telehealth: Payer: Self-pay | Admitting: Internal Medicine

## 2022-06-30 NOTE — Telephone Encounter (Signed)
Returned call to pt - LM for him to call back.

## 2022-06-30 NOTE — Telephone Encounter (Signed)
Patient has questions regarding instructions for upcoming procedure.

## 2022-06-30 NOTE — Telephone Encounter (Signed)
Spoke with pt and he wanted to clarify the time he needed to be at the hospital. I went over Instructions with him and he understands all.

## 2022-06-30 NOTE — Telephone Encounter (Signed)
Patient was returning call. Please advise ?

## 2022-07-02 ENCOUNTER — Ambulatory Visit (HOSPITAL_COMMUNITY): Payer: Managed Care, Other (non HMO) | Admitting: Certified Registered Nurse Anesthetist

## 2022-07-02 ENCOUNTER — Ambulatory Visit (HOSPITAL_COMMUNITY)
Admission: RE | Admit: 2022-07-02 | Discharge: 2022-07-02 | Disposition: A | Discharge status: Home / Self Care | Payer: Managed Care, Other (non HMO) | Attending: Cardiovascular Disease | Admitting: Cardiovascular Disease

## 2022-07-02 ENCOUNTER — Ambulatory Visit (HOSPITAL_COMMUNITY)
Admission: RE | Disposition: A | Discharge status: Home / Self Care | Payer: Self-pay | Source: Home / Self Care | Attending: Cardiovascular Disease

## 2022-07-02 DIAGNOSIS — I4821 Permanent atrial fibrillation: Secondary | ICD-10-CM

## 2022-07-02 DIAGNOSIS — I447 Left bundle-branch block, unspecified: Secondary | ICD-10-CM | POA: Insufficient documentation

## 2022-07-02 DIAGNOSIS — I5022 Chronic systolic (congestive) heart failure: Secondary | ICD-10-CM | POA: Diagnosis not present

## 2022-07-02 DIAGNOSIS — I442 Atrioventricular block, complete: Secondary | ICD-10-CM | POA: Diagnosis not present

## 2022-07-02 DIAGNOSIS — I110 Hypertensive heart disease with heart failure: Secondary | ICD-10-CM

## 2022-07-02 DIAGNOSIS — I422 Other hypertrophic cardiomyopathy: Secondary | ICD-10-CM | POA: Insufficient documentation

## 2022-07-02 DIAGNOSIS — Z95810 Presence of automatic (implantable) cardiac defibrillator: Secondary | ICD-10-CM | POA: Insufficient documentation

## 2022-07-02 DIAGNOSIS — Z01818 Encounter for other preprocedural examination: Secondary | ICD-10-CM

## 2022-07-02 DIAGNOSIS — I509 Heart failure, unspecified: Secondary | ICD-10-CM

## 2022-07-02 HISTORY — PX: AV NODE ABLATION: EP1193

## 2022-07-02 LAB — CBC WITH DIFFERENTIAL/PLATELET
Abs Immature Granulocytes: 0.02 10*3/uL (ref 0.00–0.07)
Basophils Absolute: 0 10*3/uL (ref 0.0–0.1)
Basophils Relative: 0 %
Eosinophils Absolute: 0.1 10*3/uL (ref 0.0–0.5)
Eosinophils Relative: 1 %
HCT: 47.2 % (ref 39.0–52.0)
Hemoglobin: 15.8 g/dL (ref 13.0–17.0)
Immature Granulocytes: 0 %
Lymphocytes Relative: 14 %
Lymphs Abs: 1.1 10*3/uL (ref 0.7–4.0)
MCH: 30.2 pg (ref 26.0–34.0)
MCHC: 33.5 g/dL (ref 30.0–36.0)
MCV: 90.1 fL (ref 80.0–100.0)
Monocytes Absolute: 0.4 10*3/uL (ref 0.1–1.0)
Monocytes Relative: 6 %
Neutro Abs: 6.1 10*3/uL (ref 1.7–7.7)
Neutrophils Relative %: 79 %
Platelets: 169 10*3/uL (ref 150–400)
RBC: 5.24 MIL/uL (ref 4.22–5.81)
RDW: 13.5 % (ref 11.5–15.5)
WBC: 7.7 10*3/uL (ref 4.0–10.5)
nRBC: 0 % (ref 0.0–0.2)

## 2022-07-02 SURGERY — AV NODE ABLATION
Anesthesia: Monitor Anesthesia Care

## 2022-07-02 MED ORDER — ONDANSETRON HCL 4 MG/2ML IJ SOLN: 4.0000 mg | Freq: Once | INTRAMUSCULAR | Status: DC | PRN

## 2022-07-02 MED ORDER — SODIUM CHLORIDE 0.9 % IV SOLN: INTRAVENOUS | Status: DC

## 2022-07-02 MED ORDER — PROPOFOL 500 MG/50ML IV EMUL
INTRAVENOUS | Status: DC | PRN
  Administered 2022-07-02: 50 ug/kg/min via INTRAVENOUS

## 2022-07-02 MED ORDER — SODIUM CHLORIDE 0.9% FLUSH: 3.0000 mL | Freq: Two times a day (BID) | INTRAVENOUS | Status: DC

## 2022-07-02 MED ORDER — CEFAZOLIN SODIUM-DEXTROSE 2-4 GM/100ML-% IV SOLN
INTRAVENOUS | Status: AC
  Filled 2022-07-02: qty 100

## 2022-07-02 MED ORDER — HEPARIN SODIUM (PORCINE) 1000 UNIT/ML IJ SOLN
INTRAMUSCULAR | Status: AC
  Filled 2022-07-02: qty 10

## 2022-07-02 MED ORDER — HEPARIN SODIUM (PORCINE) 1000 UNIT/ML IJ SOLN
INTRAMUSCULAR | Status: DC | PRN
  Administered 2022-07-02: 1000 [IU] via INTRAVENOUS

## 2022-07-02 MED ORDER — FENTANYL CITRATE (PF) 250 MCG/5ML IJ SOLN
INTRAMUSCULAR | Status: DC | PRN
  Administered 2022-07-02: 25 ug via INTRAVENOUS

## 2022-07-02 MED ORDER — SODIUM CHLORIDE 0.9 % IV SOLN: 250.0000 mL | INTRAVENOUS | Status: DC | PRN

## 2022-07-02 MED ORDER — CEFAZOLIN SODIUM-DEXTROSE 2-3 GM-%(50ML) IV SOLR
INTRAVENOUS | Status: DC | PRN
  Administered 2022-07-02: 2 g via INTRAVENOUS

## 2022-07-02 MED ORDER — MIDAZOLAM HCL 5 MG/5ML IJ SOLN
INTRAMUSCULAR | Status: DC | PRN
  Administered 2022-07-02: 2 mg via INTRAVENOUS

## 2022-07-02 MED ORDER — ONDANSETRON HCL 4 MG/2ML IJ SOLN: 4.0000 mg | Freq: Four times a day (QID) | INTRAMUSCULAR | Status: DC | PRN

## 2022-07-02 MED ORDER — SODIUM CHLORIDE 0.9% FLUSH: 3.0000 mL | INTRAVENOUS | Status: DC | PRN

## 2022-07-02 MED ORDER — LACTATED RINGERS IV SOLN: INTRAVENOUS | Status: DC | PRN

## 2022-07-02 MED ORDER — HEPARIN (PORCINE) IN NACL 1000-0.9 UT/500ML-% IV SOLN
INTRAVENOUS | Status: DC | PRN
  Administered 2022-07-02: 500 mL

## 2022-07-02 MED ORDER — ACETAMINOPHEN 325 MG PO TABS: 650.0000 mg | ORAL_TABLET | ORAL | Status: DC | PRN

## 2022-07-02 MED ORDER — PHENYLEPHRINE HCL-NACL 20-0.9 MG/250ML-% IV SOLN
INTRAVENOUS | Status: DC | PRN
  Administered 2022-07-02: 15 ug/min via INTRAVENOUS

## 2022-07-02 SURGICAL SUPPLY — 9 items
CATH SMTCH THERMOCOOL SF FJ (CATHETERS) IMPLANT
DEVICE CLOSURE MYNXGRIP 6/7F (Vascular Products) IMPLANT
PACK EP LATEX FREE (CUSTOM PROCEDURE TRAY) ×1
PACK EP LF (CUSTOM PROCEDURE TRAY) ×1 IMPLANT
PAD DEFIB RADIO PHYSIO CONN (PAD) ×1 IMPLANT
PATCH CARTO3 (PAD) IMPLANT
SHEATH PINNACLE 8F 10CM (SHEATH) IMPLANT
SHEATH PROBE COVER 6X72 (BAG) IMPLANT
TUBING SMART ABLATE COOLFLOW (TUBING) IMPLANT

## 2022-07-02 NOTE — Discharge Instructions (Signed)

## 2022-07-02 NOTE — Progress Notes (Signed)
Pt ambulated to and from bathroom to void with no signs of oozing from groin site  

## 2022-07-02 NOTE — Anesthesia Postprocedure Evaluation (Signed)
Anesthesia Post Note  Patient: Donald Bond  Procedure(s) Performed: AV NODE ABLATION     Patient location during evaluation: PACU Anesthesia Type: MAC Level of consciousness: awake and alert Pain management: pain level controlled Vital Signs Assessment: post-procedure vital signs reviewed and stable Respiratory status: spontaneous breathing Cardiovascular status: stable Anesthetic complications: no   No notable events documented.  Last Vitals:  Vitals:   07/02/22 1545 07/02/22 1600  BP: 106/81 104/78  Pulse: 79 79  Resp: 12 16  Temp:    SpO2: 98% 98%    Last Pain:  Vitals:   07/02/22 1526  TempSrc:   PainSc: 0-No pain                 Lewie Loron

## 2022-07-02 NOTE — Transfer of Care (Signed)
Immediate Anesthesia Transfer of Care Note  Patient: Donald Bond  Procedure(s) Performed: AV NODE ABLATION  Patient Location: Cath Lab  Anesthesia Type:MAC  Level of Consciousness: awake, oriented, patient cooperative, and responds to stimulation  Airway & Oxygen Therapy: Patient Spontanous Breathing  Post-op Assessment: Report given to RN and Post -op Vital signs reviewed and stable  Post vital signs: Reviewed and stable  Last Vitals:  Vitals Value Taken Time  BP 107/76 07/02/22 1439  Temp    Pulse 80 07/02/22 1442  Resp 16 07/02/22 1442  SpO2 96 % 07/02/22 1442  Vitals shown include unvalidated device data.  Last Pain:  Vitals:   07/02/22 1124  TempSrc:   PainSc: 0-No pain         Complications: No notable events documented.

## 2022-07-02 NOTE — H&P (Signed)
Electrophysiology Office Note:    Date:  07/02/2022   ID:  Donald Bond, DOB November 01, 1960, MRN [id number]  PCP:  Medicine, [facility] Providers Cardiologist:  None Electrophysiologist:  Sherryl Manges, MD     Referring MD: No ref. provider found   History of Present Illness:    Donald Bond is a 62 y.o. male with a hx listed below, significant for hypertrophic cardiomyopathy, CHF (EF 25-35%, [employer] BiV ICD, permanent atrial fibrillation, severe MR, referred by Dr. Graciela Husbands for arrhythmia management.  CRT pacing has been suboptimal at times due to atrial fibrillation.  He has had atrial fibrillation for several years has been determined not to be a candidate for rhythm control at this point.  He is referred for AVJ ablation.  Today he reports he is at baseline. He presents for AVJ ablation.  Past Medical History:  Diagnosis Date   AICD (automatic cardioverter/defibrillator) present X 4   CHF (congestive heart failure) (HCC)    Chronic combined systolic and diastolic congestive heart failure (HCC)    Headache    "weekly" (06/15/2017)   History of kidney stones    Hypertr obst cardiomyop    ICD--[employer]    Dr. Graciela Husbands   Mitral regurgitation    Orthostatic lightheadedness    Persistent atrial fibrillation (HCC)    Severe mitral valve regurgitation by Echo 11/24/19 11/26/2019   SYNCOPE    TIREDNESS    Tricuspid regurgitation    Ventricular fibrillation/polymorphic ventricular tachycardia 06/04/2011   Ventricular tachycardia (HCC)    rx via ICD    Past Surgical History:  Procedure Laterality Date   BIV ICD GENERATOR CHANGEOUT N/A 05/31/2020   Procedure: BIV ICD GENERATOR UPGRADE;  Surgeon: Duke Salvia, MD;  Location: [facility] INVASIVE CV LAB;  Service: Cardiovascular;  Laterality: N/A;   CARDIAC CATHETERIZATION     CARDIAC DEFIBRILLATOR PLACEMENT  ~ 2000; ~ 2010   "I've had 4" (06/15/2017)   CARDIOVERSION  06/29/2016   Procedure:  Cardioversion;  Surgeon: Duke Salvia, MD;  Location: [facility] INVASIVE CV LAB;  Service: Cardiovascular;;   CARDIOVERSION N/A 06/17/2017   Procedure: CARDIOVERSION;  Surgeon: Jake Bathe, MD;  Location: [facility] ENDOSCOPY;  Service: Cardiovascular;  Laterality: N/A;   CARDIOVERSION N/A 07/11/2019   Procedure: CARDIOVERSION;  Surgeon: Chrystie Nose, MD;  Location: [facility] ENDOSCOPY;  Service: Cardiovascular;  Laterality: N/A;   CARDIOVERSION N/A 01/03/2020   Procedure: CARDIOVERSION;  Surgeon: Jake Bathe, MD;  Location: [facility] ENDOSCOPY;  Service: Cardiovascular;  Laterality: N/A;   CARDIOVERSION N/A 06/28/2020   Procedure: CARDIOVERSION;  Surgeon: Little Ishikawa, MD;  Location: [facility] ENDOSCOPY;  Service: Cardiovascular;  Laterality: N/A;   CARDIOVERSION N/A 08/09/2020   Procedure: CARDIOVERSION;  Surgeon: Laurey Morale, MD;  Location: [facility] ENDOSCOPY;  Service: Cardiovascular;  Laterality: N/A;   Guidant Vitality ICD Impantation--Guidant Vitality T135--11/13/2003     HYDROCELE EXCISION / REPAIR Left 04/2013   ICD GENERATOR CHANGEOUT N/A 06/29/2016   Procedure: ICD Generator Changeout;  Surgeon: Duke Salvia, MD;  Location: [facility] INVASIVE CV LAB;  Service: Cardiovascular;  Laterality: N/A;   LAPAROSCOPIC CHOLECYSTECTOMY  2003   LITHOTRIPSY  X 1   RIGHT/LEFT HEART CATH AND CORONARY ANGIOGRAPHY N/A 02/14/2020   Procedure: RIGHT/LEFT HEART CATH AND CORONARY ANGIOGRAPHY;  Surgeon: Laurey Morale, MD;  Location: [facility] INVASIVE CV LAB;  Service: Cardiovascular;  Laterality: N/A;   TEE WITHOUT CARDIOVERSION N/A 02/14/2020   Procedure: TRANSESOPHAGEAL ECHOCARDIOGRAM (  TEE);  Surgeon: Laurey Morale, MD;  Location: [facility] ENDOSCOPY;  Service: Cardiovascular;  Laterality: N/A;    Current Medications: Current Meds  Medication Sig   acetaminophen (TYLENOL) 325 MG tablet Take 650 mg by mouth every 6 (six) hours as needed for mild pain or moderate pain (for pain.).   bisoprolol (ZEBETA) 5 MG tablet TAKE 1 TABLET (5  MG TOTAL) BY MOUTH IN THE MORNING AND AT BEDTIME   digoxin (LANOXIN) 0.125 MG tablet TAKE 1 TABLET BY MOUTH EVERY DAY   ELIQUIS 5 MG TABS tablet TAKE 1 TABLET (5 MG TOTAL) BY MOUTH IN THE MORNING AND AT BEDTIME   JARDIANCE 10 MG TABS tablet TAKE 1 TABLET BY MOUTH EVERY DAY BEFORE BREAKFAST   loratadine (CLARITIN) 10 MG tablet Take 10 mg by mouth daily as needed (seasonal allergies).   magnesium oxide (MAG-OX) 400 MG tablet TAKE 1 TABLET BY MOUTH EVERY DAY   rosuvastatin (CRESTOR) 10 MG tablet TAKE 1 TABLET BY MOUTH EVERY DAY   sacubitril-valsartan (ENTRESTO) 49-51 MG Take 1 tablet by mouth 2 (two) times daily.   spironolactone (ALDACTONE) 25 MG tablet TAKE 1 TABLET (25 MG TOTAL) BY MOUTH DAILY.     Allergies:   Patient has no known allergies.   Social History   Socioeconomic History   Marital status: Married    Spouse name: Not on file   Number of children: Not on file   Years of education: Not on file   Highest education level: Not on file  Occupational History   Not on file  Tobacco Use   Smoking status: Never   Smokeless tobacco: Former    Types: Chew    Quit date: 1995  Vaping Use   Vaping Use: Never used  Substance and Sexual Activity   Alcohol use: Yes    Alcohol/week: 3.0 standard drinks of alcohol    Types: 3 Cans of beer per week   Drug use: No   Sexual activity: Yes  Other Topics Concern   Not on file  Social History Narrative   Lives with spouse in [place]   employed   Social Determinants of Health   Financial Resource Strain: Not on file  Food Insecurity: Not on file  Transportation Needs: Not on file  Physical Activity: Not on file  Stress: Not on file  Social Connections: Not on file     Family History: The patient's family history includes ALS in his father; Heart attack in his brother; Hypertension in an other family member.  ROS:   Please see the history of present illness.    All other systems reviewed and are  negative.  EKGs/Labs/Other Studies Reviewed Today:    EKG:  Last EKG results: Today - AF with BiV pacing   Recent Labs: 06/15/2022: BUN 16; Creatinine, Ser 0.99; Potassium 4.4; Sodium 139 07/02/2022: Hemoglobin 15.8; Platelets 169     Physical Exam:    VS:  BP 129/88   Pulse 83   Temp (!) 97.2 F (36.2 C) (Oral)   Resp 17   Ht 5\' 7"  (1.702 m)   Wt 79.8 kg   SpO2 96%   BMI 27.57 kg/m     Wt Readings from Last 3 Encounters:  07/02/22 79.8 kg  04/30/22 81.6 kg  04/20/22 81.1 kg     GEN: Well nourished, well developed in no acute distress CARDIAC: RRR, no murmurs, rubs, gallops RESPIRATORY:  Normal work of breathing MUSCULOSKELETAL: trace edema    ASSESSMENT & PLAN:  CHFrEF: Permanent AF, often with uncontrolled rates, has received inappropriate therapy. We discussed the indication, rationale, logistics, anticipated benefits, and potential risks of the AVJ ablation procedure previously, and he does not want to review these today. All questions answered. He would like to proceed. LBBB: should have increased CRT pacing burden after AVJ [employer] BiV ICD - normal function        Medication Adjustments/Labs and Tests Ordered: Current medicines are reviewed at length with the patient today.  Concerns regarding medicines are outlined above.  Orders Placed This Encounter  Procedures   CBC with Differential/Platelet   Informed Consent Details: Physician/Practitioner Attestation; Transcribe to consent form and obtain patient signature   Initiate Pre-op Protocol   Void on call to EP Lab   Confirm CBC and BMP (or CMP) results within 7 days for inpatient and 30 days for outpatient:   Clip right and left femoral area PM before surgery   Clip right internal jugular area PM before surgery   Pre-admission testing diagnosis   EP STUDY   Insert peripheral IV   Meds ordered this encounter  Medications   0.9 %  sodium chloride infusion     Signed, Maurice Small, MD  07/02/2022 12:55 PM    [facility]

## 2022-07-02 NOTE — Anesthesia Preprocedure Evaluation (Addendum)
Anesthesia Evaluation  Patient identified by MRN, date of birth, ID band Patient awake    Reviewed: Allergy & Precautions, H&P , NPO status , Patient's Chart, lab work & pertinent test results  Airway Mallampati: II  TM Distance: >3 FB Neck ROM: Full    Dental no notable dental hx.    Pulmonary neg pulmonary ROS   Pulmonary exam normal breath sounds clear to auscultation       Cardiovascular hypertension, +CHF  + dysrhythmias Atrial Fibrillation + Cardiac Defibrillator + Valvular Problems/Murmurs MR  Rhythm:Irregular Rate:Normal + Systolic murmurs Left Ventricle: Left ventricular ejection fraction, by estimation, is 45  to 50%. The left ventricle has mildly decreased function. The left  ventricle demonstrates global hypokinesis. The left ventricular internal  cavity size was normal in size. There is   severe asymmetric left ventricular hypertrophy of the basal-septal  segment. Left ventricular diastolic parameters are indeterminate.   Right Ventricle: The right ventricular size is normal. No increase in  right ventricular wall thickness. Right ventricular systolic function is  normal. There is normal pulmonary artery systolic pressure. The tricuspid  regurgitant velocity is 2.25 m/s, and   with an assumed right atrial pressure of 3 mmHg, the estimated right  ventricular systolic pressure is 23.2 mmHg.   Left Atrium: Left atrial size was severely dilated.   Right Atrium: Right atrial size was mildly dilated.   Pericardium: There is no evidence of pericardial effusion.   Mitral Valve: No mitral valve systolic anterior motion. The mitral valve  is abnormal. Mild to moderate mitral valve regurgitation. No evidence of  mitral valve stenosis.   Tricuspid Valve: The tricuspid valve is normal in structure. Tricuspid  valve regurgitation is trivial.   Aortic Valve: The aortic valve is tricuspid. There is mild calcification  of  the aortic valve. Aortic valve regurgitation is trivial.   Pulmonic Valve: The pulmonic valve was normal in structure. Pulmonic valve  regurgitation is not visualized.   Aorta: The aortic root is normal in size and structure.     Neuro/Psych negative neurological ROS  negative psych ROS   GI/Hepatic negative GI ROS, Neg liver ROS,,,  Endo/Other  negative endocrine ROS    Renal/GU negative Renal ROS  negative genitourinary   Musculoskeletal negative musculoskeletal ROS (+)    Abdominal   Peds negative pediatric ROS (+)  Hematology negative hematology ROS (+)   Anesthesia Other Findings   Reproductive/Obstetrics negative OB ROS                             Anesthesia Physical Anesthesia Plan  ASA: 3  Anesthesia Plan: MAC   Post-op Pain Management: Minimal or no pain anticipated   Induction: Intravenous  PONV Risk Score and Plan: 2 and Ondansetron, Treatment may vary due to age or medical condition and Propofol infusion  Airway Management Planned: Nasal Cannula  Additional Equipment:   Intra-op Plan:   Post-operative Plan:   Informed Consent: I have reviewed the patients History and Physical, chart, labs and discussed the procedure including the risks, benefits and alternatives for the proposed anesthesia with the patient or authorized representative who has indicated his/her understanding and acceptance.     Dental advisory given  Plan Discussed with: CRNA and Surgeon  Anesthesia Plan Comments:        Anesthesia Quick Evaluation

## 2022-07-03 ENCOUNTER — Encounter (HOSPITAL_COMMUNITY): Payer: Self-pay | Admitting: Cardiovascular Disease

## 2022-07-03 MED FILL — Cefazolin Sodium For Inj 2 GM: INTRAMUSCULAR | Qty: 2 | Status: AC

## 2022-07-16 ENCOUNTER — Ambulatory Visit: Payer: Managed Care, Other (non HMO) | Attending: Interventional Cardiology

## 2022-07-16 DIAGNOSIS — I422 Other hypertrophic cardiomyopathy: Secondary | ICD-10-CM

## 2022-07-16 DIAGNOSIS — I472 Ventricular tachycardia, unspecified: Secondary | ICD-10-CM | POA: Diagnosis not present

## 2022-07-16 DIAGNOSIS — Z95810 Presence of automatic (implantable) cardiac defibrillator: Secondary | ICD-10-CM

## 2022-07-16 NOTE — Patient Instructions (Addendum)
Your device check was normal today.  We did decrease your lower rate limit to 70 beats per minute today.  If you experience any concerning symptoms with dizziness, increased fatigue or mild shortness of breat with exertion, or have any other questions/concerns, please call our device clinic directly at [phone].   Please keep your appointment with Dr. Nelly Laurence on May 31st 2pm.   Thank you for your visit today.

## 2022-07-17 LAB — CUP PACEART INCLINIC DEVICE CHECK
Date Time Interrogation Session: 20240516000000
HighPow Impedance: 52 Ohm
Implantable Lead Connection Status: 753985
Implantable Lead Connection Status: 753985
Implantable Lead Connection Status: 753985
Implantable Lead Implant Date: 20000815
Implantable Lead Implant Date: 20220401
Implantable Lead Implant Date: 20220401
Implantable Lead Location: 753858
Implantable Lead Location: 753859
Implantable Lead Location: 753860
Implantable Lead Model: 144
Implantable Lead Model: 7841
Implantable Lead Serial Number: 1129476
Implantable Lead Serial Number: 334759
Implantable Pulse Generator Implant Date: 20220401
Lead Channel Impedance Value: 1085 Ohm
Lead Channel Impedance Value: 634 Ohm
Lead Channel Impedance Value: 895 Ohm
Lead Channel Pacing Threshold Amplitude: 0.7 V
Lead Channel Pacing Threshold Amplitude: 0.8 V
Lead Channel Pacing Threshold Amplitude: 1 V
Lead Channel Pacing Threshold Amplitude: 1.4 V
Lead Channel Pacing Threshold Pulse Width: 0.4 ms
Lead Channel Pacing Threshold Pulse Width: 0.4 ms
Lead Channel Pacing Threshold Pulse Width: 0.5 ms
Lead Channel Setting Pacing Amplitude: 2 V
Lead Channel Setting Pacing Amplitude: 3 V
Lead Channel Setting Pacing Pulse Width: 0.4 ms
Lead Channel Setting Pacing Pulse Width: 0.5 ms
Lead Channel Setting Sensing Sensitivity: 0.5 mV
Lead Channel Setting Sensing Sensitivity: 1 mV
Pulse Gen Serial Number: 389080

## 2022-07-17 NOTE — Progress Notes (Signed)
CRT-D device check in office.  2 weeks s/p AV Node Ablation.  Thresholds and sensing consistent with previous device measurements. Lead impedance trends stable over time. Patient in persistent AFIB programmed VVIR.  1 short NSVT episode (13 beats) since last interrogation. Patient bi-ventricularly pacing 93% of the time. Device programmed with appropriate safety margins. Heart failure diagnostics reviewed and trends are stable for patient. Audible/vibratory alerts demonstrated for patient with shock plan review.  Estimated longevity 5.5 years.  Patient is to return in 2 weeks to see Dr. Nelly Laurence in office.  We will continue with routine remote monitoring.   PROGRAMMING CHANGE MADE: 1.  Decreased LRL from 80 to 70

## 2022-07-20 ENCOUNTER — Other Ambulatory Visit (HOSPITAL_COMMUNITY): Payer: Self-pay

## 2022-07-20 MED ORDER — SPIRONOLACTONE 25 MG PO TABS: 25.0000 mg | ORAL_TABLET | Freq: Every day | ORAL | 3 refills | Status: DC

## 2022-07-21 ENCOUNTER — Other Ambulatory Visit: Payer: Self-pay | Admitting: Internal Medicine

## 2022-07-28 ENCOUNTER — Ambulatory Visit (INDEPENDENT_AMBULATORY_CARE_PROVIDER_SITE_OTHER): Payer: Managed Care, Other (non HMO)

## 2022-07-28 DIAGNOSIS — I422 Other hypertrophic cardiomyopathy: Secondary | ICD-10-CM

## 2022-07-29 LAB — CUP PACEART REMOTE DEVICE CHECK
Battery Remaining Longevity: 96 mo
Battery Remaining Percentage: 100 %
Brady Statistic RA Percent Paced: 0 %
Brady Statistic RV Percent Paced: 88 %
Date Time Interrogation Session: 20240528040100
HighPow Impedance: 44 Ohm
Implantable Lead Connection Status: 753985
Implantable Lead Connection Status: 753985
Implantable Lead Connection Status: 753985
Implantable Lead Implant Date: 20000815
Implantable Lead Implant Date: 20220401
Implantable Lead Implant Date: 20220401
Implantable Lead Location: 753858
Implantable Lead Location: 753859
Implantable Lead Location: 753860
Implantable Lead Model: 144
Implantable Lead Model: 7841
Implantable Lead Serial Number: 1129476
Implantable Lead Serial Number: 334759
Implantable Pulse Generator Implant Date: 20220401
Lead Channel Impedance Value: 893 Ohm
Lead Channel Impedance Value: 985 Ohm
Lead Channel Pacing Threshold Amplitude: 0.8 V
Lead Channel Pacing Threshold Pulse Width: 0.4 ms
Lead Channel Setting Pacing Amplitude: 2 V
Lead Channel Setting Pacing Amplitude: 3 V
Lead Channel Setting Pacing Pulse Width: 0.4 ms
Lead Channel Setting Pacing Pulse Width: 0.5 ms
Lead Channel Setting Sensing Sensitivity: 0.5 mV
Lead Channel Setting Sensing Sensitivity: 1 mV
Pulse Gen Serial Number: 389080

## 2022-07-31 ENCOUNTER — Ambulatory Visit: Payer: Managed Care, Other (non HMO) | Attending: Cardiovascular Disease | Admitting: Cardiovascular Disease

## 2022-07-31 ENCOUNTER — Encounter: Payer: Self-pay | Admitting: Cardiovascular Disease

## 2022-07-31 ENCOUNTER — Other Ambulatory Visit (HOSPITAL_COMMUNITY): Payer: Self-pay

## 2022-07-31 VITALS — BP 126/70 | HR 78 | Ht 67.0 in | Wt 183.8 lb

## 2022-07-31 DIAGNOSIS — I472 Ventricular tachycardia, unspecified: Secondary | ICD-10-CM | POA: Diagnosis not present

## 2022-07-31 DIAGNOSIS — I447 Left bundle-branch block, unspecified: Secondary | ICD-10-CM

## 2022-07-31 DIAGNOSIS — I4819 Other persistent atrial fibrillation: Secondary | ICD-10-CM

## 2022-07-31 LAB — CUP PACEART INCLINIC DEVICE CHECK
Date Time Interrogation Session: 20240531150942
HighPow Impedance: 48 Ohm
Implantable Lead Connection Status: 753985
Implantable Lead Connection Status: 753985
Implantable Lead Connection Status: 753985
Implantable Lead Implant Date: 20000815
Implantable Lead Implant Date: 20220401
Implantable Lead Implant Date: 20220401
Implantable Lead Location: 753858
Implantable Lead Location: 753859
Implantable Lead Location: 753860
Implantable Lead Model: 144
Implantable Lead Model: 7841
Implantable Lead Serial Number: 1129476
Implantable Lead Serial Number: 334759
Implantable Pulse Generator Implant Date: 20220401
Lead Channel Impedance Value: 1000 Ohm
Lead Channel Impedance Value: 610 Ohm
Lead Channel Impedance Value: 894 Ohm
Lead Channel Pacing Threshold Amplitude: 0.8 V
Lead Channel Pacing Threshold Amplitude: 1 V
Lead Channel Pacing Threshold Amplitude: 1.5 V
Lead Channel Pacing Threshold Pulse Width: 0.4 ms
Lead Channel Pacing Threshold Pulse Width: 0.4 ms
Lead Channel Pacing Threshold Pulse Width: 0.5 ms
Lead Channel Sensing Intrinsic Amplitude: 22.8 mV
Lead Channel Sensing Intrinsic Amplitude: 6 mV
Lead Channel Setting Pacing Amplitude: 2 V
Lead Channel Setting Pacing Amplitude: 3 V
Lead Channel Setting Pacing Pulse Width: 0.4 ms
Lead Channel Setting Pacing Pulse Width: 0.5 ms
Lead Channel Setting Sensing Sensitivity: 0.5 mV
Lead Channel Setting Sensing Sensitivity: 1 mV
Pulse Gen Serial Number: 389080

## 2022-07-31 MED ORDER — METOPROLOL SUCCINATE ER 100 MG PO TB24
100.0000 mg | ORAL_TABLET | Freq: Every day | ORAL | 3 refills | Status: AC
Start: 2022-07-31 — End: 2024-08-10

## 2022-07-31 MED ORDER — APIXABAN 5 MG PO TABS: 5.0000 mg | ORAL_TABLET | Freq: Two times a day (BID) | ORAL | 6 refills | Status: DC

## 2022-07-31 NOTE — Progress Notes (Signed)
Electrophysiology Office Note:    Date:  07/31/2022   ID:  [name], DOB December 30, 1960, MRN [id number]  PCP:  Medicine, [facility] Providers Cardiologist:  None Electrophysiologist:  Sherryl Manges, MD     Referring MD: Medicine, [facility]*   History of Present Illness:    Donald Bond is a 62 y.o. male with a hx listed below, significant for hypertrophic cardiomyopathy, CHF (EF 25-35%, Saint Jude BiV ICD, permanent atrial fibrillation, severe MR, referred by Dr. Graciela Husbands for arrhythmia management.  CRT pacing has been suboptimal at times due to atrial fibrillation.  He has had atrial fibrillation for several years has been determined not to be a candidate for rhythm control at this point.  He is referred for AVJ ablation.  He underwent AVJ ablation on May 2. He has been feeling reasonably well since then.   Past Medical History:  Diagnosis Date   AICD (automatic cardioverter/defibrillator) present X 4   CHF (congestive heart failure) (HCC)    Chronic combined systolic and diastolic congestive heart failure (HCC)    Headache    "weekly" (06/15/2017)   History of kidney stones    Hypertr obst cardiomyop    ICD--[employer]es    Dr. Graciela Husbands   Mitral regurgitation    Orthostatic lightheadedness    Persistent atrial fibrillation (HCC)    Severe mitral valve regurgitation by Echo 11/24/19 11/26/2019   SYNCOPE    TIREDNESS    Tricuspid regurgitation    Ventricular fibrillation/polymorphic ventricular tachycardia 06/04/2011   Ventricular tachycardia (HCC)    rx via ICD    Past Surgical History:  Procedure Laterality Date   AV NODE ABLATION N/A 07/02/2022   Procedure: AV NODE ABLATION;  Surgeon: Maurice Small, MD;  Location: [facility] INVASIVE CV LAB;  Service: Cardiovascular;  Laterality: N/A;   BIV ICD GENERATOR CHANGEOUT N/A 05/31/2020   Procedure: BIV ICD GENERATOR UPGRADE;  Surgeon: Duke Salvia, MD;  Location: [facility] INVASIVE CV LAB;   Service: Cardiovascular;  Laterality: N/A;   CARDIAC CATHETERIZATION     CARDIAC DEFIBRILLATOR PLACEMENT  ~ 2000; ~ 2010   "I've had 4" (06/15/2017)   CARDIOVERSION  06/29/2016   Procedure: Cardioversion;  Surgeon: Duke Salvia, MD;  Location: [facility] INVASIVE CV LAB;  Service: Cardiovascular;;   CARDIOVERSION N/A 06/17/2017   Procedure: CARDIOVERSION;  Surgeon: Jake Bathe, MD;  Location: [facility] ENDOSCOPY;  Service: Cardiovascular;  Laterality: N/A;   CARDIOVERSION N/A 07/11/2019   Procedure: CARDIOVERSION;  Surgeon: Chrystie Nose, MD;  Location: [facility] ENDOSCOPY;  Service: Cardiovascular;  Laterality: N/A;   CARDIOVERSION N/A 01/03/2020   Procedure: CARDIOVERSION;  Surgeon: Jake Bathe, MD;  Location: [facility] ENDOSCOPY;  Service: Cardiovascular;  Laterality: N/A;   CARDIOVERSION N/A 06/28/2020   Procedure: CARDIOVERSION;  Surgeon: Little Ishikawa, MD;  Location: [facility] ENDOSCOPY;  Service: Cardiovascular;  Laterality: N/A;   CARDIOVERSION N/A 08/09/2020   Procedure: CARDIOVERSION;  Surgeon: Laurey Morale, MD;  Location: [facility] ENDOSCOPY;  Service: Cardiovascular;  Laterality: N/A;   Guidant Vitality ICD Impantation--Guidant Vitality T135--11/13/2003     HYDROCELE EXCISION / REPAIR Left 04/2013   ICD GENERATOR CHANGEOUT N/A 06/29/2016   Procedure: ICD Generator Changeout;  Surgeon: Duke Salvia, MD;  Location: [facility] INVASIVE CV LAB;  Service: Cardiovascular;  Laterality: N/A;   LAPAROSCOPIC CHOLECYSTECTOMY  2003   LITHOTRIPSY  X 1   RIGHT/LEFT HEART CATH AND CORONARY ANGIOGRAPHY N/A 02/14/2020   Procedure: RIGHT/LEFT HEART  CATH AND CORONARY ANGIOGRAPHY;  Surgeon: Laurey Morale, MD;  Location: [facility] INVASIVE CV LAB;  Service: Cardiovascular;  Laterality: N/A;   TEE WITHOUT CARDIOVERSION N/A 02/14/2020   Procedure: TRANSESOPHAGEAL ECHOCARDIOGRAM (TEE);  Surgeon: Laurey Morale, MD;  Location: [facility] ENDOSCOPY;  Service: Cardiovascular;  Laterality: N/A;    Current Medications: Current Meds  Medication  Sig   acetaminophen (TYLENOL) 325 MG tablet Take 650 mg by mouth every 6 (six) hours as needed for mild pain or moderate pain (for pain.).   digoxin (LANOXIN) 0.125 MG tablet TAKE 1 TABLET BY MOUTH EVERY DAY   ELIQUIS 5 MG TABS tablet TAKE 1 TABLET (5 MG TOTAL) BY MOUTH IN THE MORNING AND AT BEDTIME   JARDIANCE 10 MG TABS tablet TAKE 1 TABLET BY MOUTH EVERY DAY BEFORE BREAKFAST   magnesium oxide (MAG-OX) 400 MG tablet TAKE 1 TABLET BY MOUTH EVERY DAY   metoprolol succinate (TOPROL-XL) 100 MG 24 hr tablet Take 1 tablet (100 mg total) by mouth daily. Take with or immediately following a meal.   rosuvastatin (CRESTOR) 10 MG tablet TAKE 1 TABLET BY MOUTH EVERY DAY   sacubitril-valsartan (ENTRESTO) 49-51 MG Take 1 tablet by mouth 2 (two) times daily.   spironolactone (ALDACTONE) 25 MG tablet Take 1 tablet (25 mg total) by mouth daily.   [DISCONTINUED] bisoprolol (ZEBETA) 5 MG tablet TAKE 1 TABLET (5 MG TOTAL) BY MOUTH IN THE MORNING AND AT BEDTIME     Allergies:   Patient has no known allergies.   Social History   Socioeconomic History   Marital status: Married    Spouse name: Not on file   Number of children: Not on file   Years of education: Not on file   Highest education level: Not on file  Occupational History   Not on file  Tobacco Use   Smoking status: Never   Smokeless tobacco: Former    Types: Chew    Quit date: 1995  Vaping Use   Vaping Use: Never used  Substance and Sexual Activity   Alcohol use: Yes    Alcohol/week: 3.0 standard drinks of alcohol    Types: 3 Cans of beer per week   Drug use: No   Sexual activity: Yes  Other Topics Concern   Not on file  Social History Narrative   Lives with spouse in [place]   employed   Social Determinants of Health   Financial Resource Strain: Not on file  Food Insecurity: Not on file  Transportation Needs: Not on file  Physical Activity: Not on file  Stress: Not on file  Social Connections: Not on file     Family  History: The patient's family history includes ALS in his father; Heart attack in his brother; Hypertension in an other family member.  ROS:   Please see the history of present illness.    All other systems reviewed and are negative.  EKGs/Labs/Other Studies Reviewed Today:    EKG:  Last EKG results: Today - AF with BiV pacing, frequent PVCs   Recent Labs: 06/15/2022: BUN 16; Creatinine, Ser 0.99; Potassium 4.4; Sodium 139 07/02/2022: Hemoglobin 15.8; Platelets 169     Physical Exam:    VS:  BP 126/70 (BP Location: Left Arm, Patient Position: Sitting, Cuff Size: Normal)   Pulse 78   Ht 5\' 7"  (1.702 m)   Wt 183 lb 12.8 oz (83.4 kg)   SpO2 95%   BMI 28.79 kg/m     Wt Readings from Last 3 Encounters:  07/31/22 183 lb 12.8 oz (83.4 kg)  07/02/22 176 lb (79.8 kg)  04/30/22 180 lb (81.6 kg)     GEN: Well nourished, well developed in no acute distress CARDIAC: RRR, no murmurs, rubs, gallops RESPIRATORY:  Normal work of breathing MUSCULOSKELETAL: trace edema    ASSESSMENT & PLAN:    CHFrEF:  Permanent AF Uncontrolled rates, inappropriate tachy therapy, inadequate CRT prior to AVJ S/p AVJ ablation, rates now controlled  LBBB:  CRT-D,   [employer] Scientific BiV ICD  normal function. I reviewed the device interrogation today.  Frequent PVCs Repeat TTE Switch from Bisoprolol to metoprolol XL   VT History of sustained VT, appropriate device therapy Had nonsustained VT this interrogation Will try metoprolol  Follow-up 3 months        Medication Adjustments/Labs and Tests Ordered: Current medicines are reviewed at length with the patient today.  Concerns regarding medicines are outlined above.  No orders of the defined types were placed in this encounter.  Meds ordered this encounter  Medications   metoprolol succinate (TOPROL-XL) 100 MG 24 hr tablet    Sig: Take 1 tablet (100 mg total) by mouth daily. Take with or immediately following a meal.    Dispense:   90 tablet    Refill:  3     Signed, Maurice Small, MD  07/31/2022 3:01 PM    [facility]

## 2022-07-31 NOTE — Patient Instructions (Signed)
Medication Instructions:  STOP Bisoprolol  START Metoprolol Succinate 100 mg once daily  *If you need a refill on your cardiac medications before your next appointment, please call your pharmacy*   Follow-Up: At [facility], you and your health needs are our priority.  As part of our continuing mission to provide you with exceptional heart care, we have created designated Provider Care Teams.  These Care Teams include your primary Cardiologist (physician) and Advanced Practice Providers (APPs -  Physician Assistants and Nurse Practitioners) who all work together to provide you with the care you need, when you need it.  We recommend signing up for the patient portal called "MyChart".  Sign up information is provided on this After Visit Summary.  MyChart is used to connect with patients for Virtual Visits (Telemedicine).  Patients are able to view lab/test results, encounter notes, upcoming appointments, etc.  Non-urgent messages can be sent to your provider as well.   To learn more about what you can do with MyChart, go to [email or web address].    Your next appointment:   3 month(s)  Provider:   York Pellant, MD

## 2022-08-03 NOTE — Progress Notes (Signed)
PCP: Medicine, [facility] EP: Dr. Graciela Husbands HF Cardiology: Dr. Shirlee Latch  62 y.o. with hypertrophic nonobstructive cardiomyopathy (gene+) was referred by Dr. Graciela Husbands for evaluation of newly noted systolic dysfunction.  Patient has HCM as evidenced by severe asymmetric septal hypertrophy.  He does not have LVOT obstruction or SAM.  His brother had SCD at 54, presumable due to HCM, and patient has a Environmental manager ICD.  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  Patient has struggled with atrial fibrillation.  He was seen by Dr. Johney Frame a couple of years ago and decided against AF ablation.  He is currently maintained on Tikosyn.  He was on ranolazone + Tikosyn but developed VT x 2 on this combination and ranolazine was stopped.  Most recently, had DCCV 01/03/20.  He is now back in atrial fibrillation.  He feels the atrial fibrillation when his rate gets fast (>100 bpm).  Today, rate is controlled in the 70s.  Echo in 4/18 showed EF 55-60%, but echo in 9/21 showed EF down to 25-30% with moderate-severe MR (no SAM).   Patient had RHC/LHC in 12/21 showing 90% stenosis in small ostial D1 and small ostial OM2, no interventional target.  Cardiac output was preserved with normal filling pressures.  TEE showed EF 30-35% with 3+ mitral regurgitation.   Patient saw Dr. Cornelius Moras for evaluation for MV repair, surgical Maze, epicardial LV lead.  Stress echo in 2/22 showed mild MR at rest and moderate MR at stress.  It was decided that MR was not severe enough to warrant surgery.   CPX in 2/22 showed moderate-severe HF functional limitation.   Patient had CRT upgrade in 4/22 then DCCV later in 4/22 to NSR. He had a recurrence of atrial flutter with DCCV in 6/22.  Cardioversion lasted less than a day before he was back in atrial flutter again. Patient was evaluated at [facility] for 2nd opinion on atrial flutter/fibrillation  ablation, but they also thought that he would be unlikely to hold NSR.   Echo was done in 10/22, showing EF 30-35% with severe focal basal septal hypertrophy, no LVOT obstruction, no SAM, moderate MR, mild RV enlargement with mildly decreased systolic function, severe LAE, and normal IVC.   Echo was done today and reviewed, showing EF 45-50%, severe focal basal septal hypertrophy with no LVOT obstruction, no SAM, mild-moderate MR, normal RV, normal IVC.   Patient returns for followup of CHF.  He remains in atrial fibrillation chronically.  He continues to work full time.  Weight down 7 lbs.  He fatigues by the end of the day.  He is not short of breath with his usual activities.  He tries to get in 6000-7000 steps/day.  No chest pain.  Rare lightheadedness.   ECG (personally reviewed): atrial fibrillation with BiV pacing, QTc 529 msec.   Labs (10/21): K 4.3, creatinine 0.75 Labs (11/21): K 5.1, creatinine 0.9 Labs (1/22): LDL 74, HDL 63, K 4.3, creatinine 4.54 Labs (4/22): K 4.7, creatinine 1.1 Labs (6/22): digoxin 0.8, K 4.5, creatinine 0.95 Labs (9/22): digoxin 0.5, K 4, creatinine 0.85 Labs (12/22): LDL 53, digoxin 0.7, creatinine 0.88 Labs (9/23): K 4.5, creatinine 0.79  PMH: 1. Atrial fibrillation/flutter: Was on ranolazine + Tikosyn but had VT, ranolazine stopped.  Now on Tikosyn.  - DCCV 11/21. - DCCV 4/22 - DCCV 6/22: lasted < 1 day.  2. Hypertrophic cardiomyopathy: No LVOT obstruction or SAM.  Gene+.  Brother with SCD at 55.  3. Chronic systolic CHF: Nonischemic cardiomyopathy, likely due to burned out HCM.  [employer] CRT-D device.  - Echo (4/18): Severe asymmetric septal hypertrophy, no SAM, moderate MR, EF 55-60%.  - Echo (9/21): Severe asymmetric septal hypertrophy, no SAM or LVOT obstruction, EF 25-30%, mildly decreased RV systolic function, PASP 60 mmHg, severe LAE, severe MR with restricted posterior leaflet.  - TEE (12/21): severe asymmetric basal septal  hypertrophy, EF 30-35%, mildly decreased RV systolic function, severe LAE, moderate TR, mild AI, restricted posterior MV leaflet with 2 MR jets, ERO 0.3 (suspect 3+ MR, Carpentier IIIb).  - LHC/RHC (12/21): 90% stenosis in small ostial D1 and small ostial OM2, no interventional target. Mean RA 3, PA 38/14, mean PCWP 14 with v waves to 22, CI 2.66, PVR 2.1 WU.  - CPX (2/22): RER 1.28, VE/VCO2 47, peak VO2 18.5.  Moderate-severe functional limitation due to HF, suspect severe elevation LA pressure/PA pressure with exercise.  - 4/22 upgrade to [facility] Scientific CRT-D.  - Echo (10/22): EF 30-35% with severe focal basal septal hypertrophy, no LVOT obstruction, no SAM, moderate MR, mild RV enlargement with mildly decreased systolic function, severe LAE, and normal IVC.  - Echo (12/23): EF 45-50%, severe focal basal septal hypertrophy with no LVOT obstruction, no SAM, mild-moderate MR, normal RV, normal IVC.  4. VT: 9/21, suspected to be due to ranolazine + Tikosyn combination, ranolazine stopped.  5. H/o nephrolithiasis.  6. Mitral regurgitation: Moderate to severe by my review of 9/21 echo,  restriction of posterior leaflet.  - TEE (12/21): Restricted posterior MV leaflet with 2 MR jets, ERO 0.3 (suspect 3+ MR, Carpentier IIIb). - Stress echo (2/22): mild MR at rest, moderate MR with stress.  - Moderate on 10/22 echo.  7. LBBB  FH: Mother with atrial fibrillation.  Brother with SCD at 48.  Son also with HCM gene+.   Social History   Socioeconomic History   Marital status: Married    Spouse name: Not on file   Number of children: Not on file   Years of education: Not on file   Highest education level: Not on file  Occupational History   Not on file  Tobacco Use   Smoking status: Never   Smokeless tobacco: Former    Types: Chew    Quit date: 1995  Vaping Use   Vaping Use: Never used  Substance and Sexual Activity   Alcohol use: Yes    Alcohol/week: 3.0 standard drinks of alcohol     Types: 3 Cans of beer per week   Drug use: No   Sexual activity: Yes  Other Topics Concern   Not on file  Social History Narrative   Lives with spouse in [place]   employed   Social Determinants of Health   Financial Resource Strain: Not on file  Food Insecurity: Not on file  Transportation Needs: Not on file  Physical Activity: Not on file  Stress: Not on file  Social Connections: Not on file  Intimate Partner Violence: Not on file   ROS: All systems reviewed and negative except as per HPI.   Current Outpatient Medications  Medication Sig Dispense Refill   acetaminophen (TYLENOL) 325 MG tablet Take 650 mg by mouth every 6 (six) hours as needed for mild pain or moderate pain (for pain.).     apixaban (ELIQUIS) 5 MG TABS tablet Take 1 tablet (5 mg total) by mouth in the morning and at bedtime. 60 tablet  6   digoxin (LANOXIN) 0.125 MG tablet TAKE 1 TABLET BY MOUTH EVERY DAY 49 tablet 2   JARDIANCE 10 MG TABS tablet TAKE 1 TABLET BY MOUTH EVERY DAY BEFORE BREAKFAST 90 tablet 3   loratadine (CLARITIN) 10 MG tablet Take 10 mg by mouth daily as needed (seasonal allergies). (Patient not taking: Reported on 07/31/2022)     magnesium oxide (MAG-OX) 400 MG tablet TAKE 1 TABLET BY MOUTH EVERY DAY 30 tablet 11   metoprolol succinate (TOPROL-XL) 100 MG 24 hr tablet Take 1 tablet (100 mg total) by mouth daily. Take with or immediately following a meal. 90 tablet 3   rosuvastatin (CRESTOR) 10 MG tablet TAKE 1 TABLET BY MOUTH EVERY DAY 90 tablet 3   sacubitril-valsartan (ENTRESTO) 49-51 MG Take 1 tablet by mouth 2 (two) times daily. 180 tablet 3   spironolactone (ALDACTONE) 25 MG tablet Take 1 tablet (25 mg total) by mouth daily. 90 tablet 3   No current facility-administered medications for this visit.   There were no vitals taken for this visit. General: NAD Neck: No JVD, no thyromegaly or thyroid nodule.  Lungs: Clear to auscultation bilaterally with normal respiratory effort. CV:  Nondisplaced PMI.  Heart irregular S1/S2, no S3/S4, no murmur.  No peripheral edema.  No carotid bruit.  Normal pedal pulses.  Abdomen: Soft, nontender, no hepatosplenomegaly, no distention.  Skin: Intact without lesions or rashes.  Neurologic: Alert and oriented x 3.  Psych: Normal affect. Extremities: No clubbing or cyanosis.  HEENT: Normal.    Assessment/Plan: 1. Chronic systolic CHF: Echo in 9/21 showed severe asymmetric septal hypertrophy, no SAM or LVOT obstruction, EF 25-30%, mildly decreased RV systolic function, PASP 60 mmHg, severe LAE, severe MR with restricted posterior leaflet.  EF was lower than in the past (normal in 4/18) and mitral regurgitation was worse.  LHC/RHC in 12/21 showed 90% stenosis small D1 and 90% stenosis small OM2 (CAD does not explain cardiomyopathy) and normal filling pressures/preserved cardiac output.  Cardiomyopathy could be natural history of HCM with worsening of function over time.  CPX in 2/22 showed moderate-severe functional limitation.  [employer] CRT-D upgrade in 4/22.  He is now in persistent atrial fibrillation/atypical flutter, failed 6/22 DCCV.  Most recent echo was done today showing improved EF 45-50%, severe focal basal septal hypertrophy with no LVOT obstruction, no SAM, mild-moderate MR, normal RV, normal IVC. We were unable to interrogate his device today to assess BiV pacing percentage. NYHA class II, not volume overloaded.   - Continue bisoprolol 5 mg bid.  - Continue Entresto 49/51 bid, do not think he has BP room to increase.  - Continue dapagliflozin 10 mg daily.   - Continue digoxin, check level today.  - Continue spironolactone 25 mg daily. BMET today.  - I do not think he needs Lasix.   - Would consider AV nodal ablation given permanent AF/AFL to maximize BiV pacing.  I am unable to check his device today.  He will followup with Dr. Graciela Husbands to consider this. His EF has improved but still remains mildly low.  2. Atrial  fibrillation/flutter: Paroxysmal => chronic.  He had DCCV 01/03/20 but went back into atrial fibrillation despite Tikosyn use.  He is off ranolazine due to VT with combination of Tikosyn and ranolazine.  Atrial fibrillation ablation likely has a lower chance of success here due to the size of the left atrium (severely enlarged).  DCCV in 4/22 was successful but flutter recurred. DCCV in 6/22 only held for  about a day. He was seen at [facility] for 2nd opinion, did not think ablation would be successful.  He is now off Tikosyn due to futility. Would not try DCCV again.  - Continue bisoprolol 5 mg bid for rate control.  - Continue apixaban 5 mg bid, CBC today.   - See discussion above about AV nodal ablation to maximize BiV pacing.  3. Mitral regurgitation: TEE in 12/21 with 3+ MR, Carpentier IIIb.  Stress echo in 4/22 showed moderate MR with stress.  He saw Dr. Cornelius Moras, MR not thought bad enough to warrant surgical repair. CRT upgrade may help limit MR in the long-term. Mild-moderate MR on today's echo.  4. Hypertrophic cardiomyopathy: No SAM or LVOT obstruction, has asymmetric septal hypertrophy.  Gene+, brother with SCD at 19.   5. VT: In 9/21, suspect due to combination of ranolazone and Tikosyn with QT prolongation.  Now off both.  6. CAD: LHC (12/21) with 90% ostial small D1 and 90% ostial small OM2.  CAD does not explain the extent of his cardiomyopathy.  No chest pain.  - Continue Crestor, check lipids today.  - No ASA given Eliquis use.   Followup in 4 months with APP.    Anderson Malta [facility] 08/03/2022

## 2022-08-04 ENCOUNTER — Encounter (HOSPITAL_COMMUNITY): Payer: Self-pay

## 2022-08-04 ENCOUNTER — Ambulatory Visit (HOSPITAL_COMMUNITY)
Admission: RE | Admit: 2022-08-04 | Discharge: 2022-08-04 | Disposition: A | Discharge status: Home / Self Care | Payer: Managed Care, Other (non HMO) | Source: Ambulatory Visit | Attending: Family Medicine | Admitting: Family Medicine

## 2022-08-04 ENCOUNTER — Other Ambulatory Visit (HOSPITAL_COMMUNITY): Payer: Self-pay

## 2022-08-04 ENCOUNTER — Telehealth (HOSPITAL_COMMUNITY): Payer: Self-pay | Admitting: Vascular Surgery

## 2022-08-04 VITALS — BP 112/78 | HR 70 | Wt 182.6 lb

## 2022-08-04 DIAGNOSIS — I5022 Chronic systolic (congestive) heart failure: Secondary | ICD-10-CM | POA: Diagnosis present

## 2022-08-04 DIAGNOSIS — I340 Nonrheumatic mitral (valve) insufficiency: Secondary | ICD-10-CM | POA: Insufficient documentation

## 2022-08-04 DIAGNOSIS — Z7901 Long term (current) use of anticoagulants: Secondary | ICD-10-CM | POA: Diagnosis not present

## 2022-08-04 DIAGNOSIS — I493 Ventricular premature depolarization: Secondary | ICD-10-CM | POA: Insufficient documentation

## 2022-08-04 DIAGNOSIS — I422 Other hypertrophic cardiomyopathy: Secondary | ICD-10-CM | POA: Diagnosis not present

## 2022-08-04 DIAGNOSIS — Z7984 Long term (current) use of oral hypoglycemic drugs: Secondary | ICD-10-CM | POA: Diagnosis not present

## 2022-08-04 DIAGNOSIS — I4819 Other persistent atrial fibrillation: Secondary | ICD-10-CM | POA: Diagnosis not present

## 2022-08-04 DIAGNOSIS — I2510 Atherosclerotic heart disease of native coronary artery without angina pectoris: Secondary | ICD-10-CM | POA: Diagnosis not present

## 2022-08-04 DIAGNOSIS — I482 Chronic atrial fibrillation, unspecified: Secondary | ICD-10-CM | POA: Diagnosis not present

## 2022-08-04 DIAGNOSIS — I4892 Unspecified atrial flutter: Secondary | ICD-10-CM | POA: Insufficient documentation

## 2022-08-04 DIAGNOSIS — Z79899 Other long term (current) drug therapy: Secondary | ICD-10-CM | POA: Insufficient documentation

## 2022-08-04 DIAGNOSIS — I472 Ventricular tachycardia, unspecified: Secondary | ICD-10-CM

## 2022-08-04 DIAGNOSIS — I5042 Chronic combined systolic (congestive) and diastolic (congestive) heart failure: Secondary | ICD-10-CM

## 2022-08-04 LAB — BASIC METABOLIC PANEL
Anion gap: 7 (ref 5–15)
BUN: 16 mg/dL (ref 8–23)
CO2: 25 mmol/L (ref 22–32)
Calcium: 9.4 mg/dL (ref 8.9–10.3)
Chloride: 103 mmol/L (ref 98–111)
Creatinine, Ser: 1.04 mg/dL (ref 0.61–1.24)
GFR, Estimated: >60 mL/min (ref >60)
Glucose, Bld: 87 mg/dL (ref 70–99)
Potassium: 4.3 mmol/L (ref 3.5–5.1)
Sodium: 135 mmol/L (ref 135–145)

## 2022-08-04 NOTE — Addendum Note (Signed)
Addended by: Anselm Pancoast on: 08/04/2022 01:24 PM   Modules accepted: Orders

## 2022-08-04 NOTE — Patient Instructions (Signed)
Labs done today. We will contact you only if your labs are abnormal.  No medication changes were made. Please continue all current medications as prescribed.  Your physician recommends that you schedule a follow-up appointment in: 4 months. Please contact our office in September to schedule a October appointment.   If you have any questions or concerns before your next appointment please send [place] a message through [place] or call our office at [phone].    TO LEAVE A MESSAGE FOR THE NURSE SELECT OPTION 2, PLEASE LEAVE A MESSAGE INCLUDING: YOUR NAME DATE OF BIRTH CALL BACK NUMBER REASON FOR CALL**this is important as we prioritize the call backs  YOU WILL RECEIVE A CALL BACK THE SAME DAY AS LONG AS YOU CALL BEFORE 4:00 PM   Do the following things EVERYDAY: Weigh yourself in the morning before breakfast. Write it down and keep it in a log. Take your medicines as prescribed Eat low salt foods--Limit salt (sodium) to 2000 mg per day.  Stay as active as you can everyday Limit all fluids for the day to less than 2 liters   At the [facility], you and your health needs are our priority. As part of our continuing mission to provide you with exceptional heart care, we have created designated Provider Care Teams. These Care Teams include your primary Cardiologist (physician) and Advanced Practice Providers (APPs- Physician Assistants and Nurse Practitioners) who all work together to provide you with the care you need, when you need it.   You may see any of the following providers on your designated Care Team at your next follow up: Dr Arvilla Meres Dr Marca Ancona Dr. Marcos Eke, NP Robbie Lis, [place] [facility] [place], [place] Brynda Peon, NP Karle Plumber, PharmD   Please be sure to bring in all your medications bottles to every appointment.    Thank you for choosing  [facility]

## 2022-08-04 NOTE — Telephone Encounter (Signed)
Lvm to schedule echo next ava

## 2022-08-12 ENCOUNTER — Ambulatory Visit (HOSPITAL_COMMUNITY)
Admission: RE | Admit: 2022-08-12 | Discharge: 2022-08-12 | Disposition: A | Discharge status: Home / Self Care | Payer: Managed Care, Other (non HMO) | Source: Ambulatory Visit | Attending: Family Medicine | Admitting: Family Medicine

## 2022-08-12 DIAGNOSIS — Z95810 Presence of automatic (implantable) cardiac defibrillator: Secondary | ICD-10-CM | POA: Diagnosis not present

## 2022-08-12 DIAGNOSIS — I080 Rheumatic disorders of both mitral and aortic valves: Secondary | ICD-10-CM | POA: Insufficient documentation

## 2022-08-12 DIAGNOSIS — I5042 Chronic combined systolic (congestive) and diastolic (congestive) heart failure: Secondary | ICD-10-CM | POA: Insufficient documentation

## 2022-08-12 LAB — ECHOCARDIOGRAM COMPLETE
Calc EF: 48.9 %
MV M vel: 4.99 m/s
MV Peak grad: 99.4 mmHg
P 1/2 time: 785 msec
Radius: 0.55 cm
S' Lateral: 2.8 cm
Single Plane A2C EF: 49.5 %
Single Plane A4C EF: 47.9 %

## 2022-08-12 NOTE — Progress Notes (Signed)
Echocardiogram 2D Echocardiogram has been performed.  Donald Bond 08/12/2022, 10:15 AM

## 2022-08-21 NOTE — Progress Notes (Signed)
Remote ICD transmission.   

## 2022-10-26 ENCOUNTER — Ambulatory Visit: Payer: Managed Care, Other (non HMO)

## 2022-10-26 DIAGNOSIS — I422 Other hypertrophic cardiomyopathy: Secondary | ICD-10-CM

## 2022-10-26 DIAGNOSIS — I472 Ventricular tachycardia, unspecified: Secondary | ICD-10-CM

## 2022-10-26 LAB — CUP PACEART REMOTE DEVICE CHECK
Battery Remaining Longevity: 102 mo
Battery Remaining Percentage: 100 %
Brady Statistic RA Percent Paced: 0 %
Brady Statistic RV Percent Paced: 91 %
Date Time Interrogation Session: 20240826040100
HighPow Impedance: 47 Ohm
Implantable Lead Connection Status: 753985
Implantable Lead Connection Status: 753985
Implantable Lead Connection Status: 753985
Implantable Lead Implant Date: 20000815
Implantable Lead Implant Date: 20220401
Implantable Lead Implant Date: 20220401
Implantable Lead Location: 753858
Implantable Lead Location: 753859
Implantable Lead Location: 753860
Implantable Lead Model: 144
Implantable Lead Model: 7841
Implantable Lead Serial Number: 1129476
Implantable Lead Serial Number: 334759
Implantable Pulse Generator Implant Date: 20220401
Lead Channel Impedance Value: 1031 Ohm
Lead Channel Impedance Value: 847 Ohm
Lead Channel Pacing Threshold Amplitude: 0.9 V
Lead Channel Pacing Threshold Pulse Width: 0.4 ms
Lead Channel Setting Pacing Amplitude: 2 V
Lead Channel Setting Pacing Amplitude: 3 V
Lead Channel Setting Pacing Pulse Width: 0.4 ms
Lead Channel Setting Pacing Pulse Width: 0.5 ms
Lead Channel Setting Sensing Sensitivity: 0.5 mV
Lead Channel Setting Sensing Sensitivity: 1 mV
Pulse Gen Serial Number: 389080

## 2022-10-28 NOTE — Progress Notes (Unsigned)
  Electrophysiology Office Note:    Date:  10/29/2022   ID:  Donald Bond, DOB 03-03-1960, MRN [id number]  PCP:  Medicine, [facility] Providers Cardiologist:  None Electrophysiologist:  Sherryl Manges, MD     Referring MD: Medicine, [facility]*   History of Present Illness:    Donald Bond is a 62 y.o. male with a hx listed below, significant for hypertrophic cardiomyopathy, CHF (EF 25-35%, [employer] BiV ICD, permanent atrial fibrillation, severe MR, referred by Dr. Graciela Husbands for arrhythmia management.     CRT pacing has been suboptimal at times due to atrial fibrillation.  He has had atrial fibrillation for several years has been determined not to be a candidate for rhythm control at this point.  He is referred for AVJ ablation.  He underwent AVJ ablation on May 2. He has been feeling reasonably well since then.       Today, he reports that he continues to do well.   EKGs/Labs/Other Studies Reviewed Today:      TTE 2022/09/03 EF 50 to 55%.  Severe concentric left ventricular hypertrophy.  Left atrium severely dilated.  EKG Interpretation Date/Time:  Thursday October 29 2022 09:42:46 EDT Ventricular Rate:  73 PR Interval:    QRS Duration:  164 QT Interval:  460 QTC Calculation: 506 R Axis:   266  Text Interpretation: Ventricular-paced rhythm with occasional Premature ventricular complexes When compared with ECG of 02-Jul-2022 14:49, Vent. rate has decreased BY   8 BPM Confirmed by York Pellant [id number] on 10/29/2022 9:58:17 AM    Physical Exam:    VS:  BP 134/86   Pulse 73   Ht 5\' 7"  (1.702 m)   Wt 175 lb (79.4 kg)   SpO2 97%   BMI 27.41 kg/m     Wt Readings from Last 3 Encounters:  10/29/22 175 lb (79.4 kg)  08/04/22 182 lb 9.6 oz (82.8 kg)  07/31/22 183 lb 12.8 oz (83.4 kg)     GEN: Well nourished, well developed in no acute distress CARDIAC: RRR, no murmurs, rubs, gallops RESPIRATORY:  Normal work of  breathing MUSCULOSKELETAL: trace edema    ASSESSMENT & PLAN:    CHFrEF: EF has normalized Appears euvolemic and well compensated today. Has severe focal basal septal hypertrophy  Permanent AF Uncontrolled rates, inappropriate tachy therapy, inadequate CRT prior to AVJ S/p AVJ ablation, rates now controlled On Eliquis 5 mg  LBBB:  CRT-D  [employer] BiV ICD  normal function. I reviewed the device interrogation today.  Frequent PVCs Repeat TTE shows normal EF V-trigger pacing enabled today Continue metoprolol XL   VT History of sustained VT, appropriate device therapy Had 1 nonsustained VT this interrogation           Medication Adjustments/Labs and Tests Ordered: Current medicines are reviewed at length with the patient today.  Concerns regarding medicines are outlined above.  Orders Placed This Encounter  Procedures   EKG 12-Lead   No orders of the defined types were placed in this encounter.    Signed, Maurice Small, MD  10/29/2022 10:21 AM    [facility]

## 2022-10-29 ENCOUNTER — Encounter: Payer: Self-pay | Admitting: Cardiovascular Disease

## 2022-10-29 ENCOUNTER — Ambulatory Visit: Payer: Managed Care, Other (non HMO) | Attending: Cardiovascular Disease | Admitting: Cardiovascular Disease

## 2022-10-29 VITALS — BP 134/86 | HR 73 | Ht 67.0 in | Wt 175.0 lb

## 2022-10-29 DIAGNOSIS — I4819 Other persistent atrial fibrillation: Secondary | ICD-10-CM

## 2022-10-29 NOTE — Patient Instructions (Signed)

## 2022-11-04 NOTE — Progress Notes (Signed)
Remote ICD transmission.   

## 2023-01-25 ENCOUNTER — Ambulatory Visit (INDEPENDENT_AMBULATORY_CARE_PROVIDER_SITE_OTHER): Payer: Self-pay

## 2023-01-25 DIAGNOSIS — I422 Other hypertrophic cardiomyopathy: Secondary | ICD-10-CM | POA: Diagnosis not present

## 2023-01-25 DIAGNOSIS — I472 Ventricular tachycardia, unspecified: Secondary | ICD-10-CM

## 2023-01-25 LAB — CUP PACEART REMOTE DEVICE CHECK
Battery Remaining Longevity: 96 mo
Battery Remaining Percentage: 100 %
Brady Statistic RA Percent Paced: 0 %
Brady Statistic RV Percent Paced: 83 %
Date Time Interrogation Session: 20241125040000
HighPow Impedance: 50 Ohm
Implantable Lead Connection Status: 753985
Implantable Lead Connection Status: 753985
Implantable Lead Connection Status: 753985
Implantable Lead Implant Date: 20000815
Implantable Lead Implant Date: 20220401
Implantable Lead Implant Date: 20220401
Implantable Lead Location: 753858
Implantable Lead Location: 753859
Implantable Lead Location: 753860
Implantable Lead Model: 144
Implantable Lead Model: 7841
Implantable Lead Serial Number: 1129476
Implantable Lead Serial Number: 334759
Implantable Pulse Generator Implant Date: 20220401
Lead Channel Impedance Value: 1135 Ohm
Lead Channel Impedance Value: 788 Ohm
Lead Channel Pacing Threshold Amplitude: 0.7 V
Lead Channel Pacing Threshold Pulse Width: 0.4 ms
Lead Channel Setting Pacing Amplitude: 2 V
Lead Channel Setting Pacing Amplitude: 3 V
Lead Channel Setting Pacing Pulse Width: 0.4 ms
Lead Channel Setting Pacing Pulse Width: 0.5 ms
Lead Channel Setting Sensing Sensitivity: 0.5 mV
Lead Channel Setting Sensing Sensitivity: 1 mV
Pulse Gen Serial Number: 389080

## 2023-02-19 NOTE — Progress Notes (Signed)
Remote ICD transmission.   

## 2023-03-15 ENCOUNTER — Other Ambulatory Visit (HOSPITAL_COMMUNITY): Payer: Self-pay | Admitting: Cardiology

## 2023-03-15 ENCOUNTER — Other Ambulatory Visit (HOSPITAL_COMMUNITY): Payer: Self-pay

## 2023-03-15 MED ORDER — EMPAGLIFLOZIN 10 MG PO TABS: 10.0000 mg | ORAL_TABLET | Freq: Every day | ORAL | 0 refills | Status: DC

## 2023-03-22 ENCOUNTER — Other Ambulatory Visit: Payer: Self-pay | Admitting: Internal Medicine

## 2023-04-12 ENCOUNTER — Telehealth: Payer: Self-pay

## 2023-04-12 NOTE — Telephone Encounter (Signed)
 LMTCB on patients cell phone. Reviewed w/ JP MD. Looks like VT from [place]. Could be atrial flutter with aberrancy. Spke w/ EP APP. Appropriate to provide ED precautions and schedule w/ EP APP in the next 2-3 weeks.

## 2023-04-12 NOTE — Telephone Encounter (Addendum)
 Alert remote transmission: Antitachycardia pacing (ATP) therapy delivered to convert arrhythmia. 1 VT-1 classified episode on 04/09/23 at 16:16, 31 sec duration, EGM c/w VT appropriately treated with ATP x 1 Burst, followed by an additional 11 beats before self terminating, average V rate 166 bpm; routed to clinic high alert for review.  17 NS-VT classified episodes, longest 15 sec (device defined) on 03/02/23 at 12:48, most available EGMs c/w NSVT at 166-202 bpm, EGM V-163 on 01/25/23 at 06:47 c/w VT of indeterminate duration with no onset appreciated; routed to clinic for review. Follow up as scheduled. MC, [facility]  Called and spoke w/ patient. Asymptomatic. Compliant w/ meds. Does not recall anything over last few months out of the ordinary. Looks like SVT/AF RVR as opposed to true VT when compared w/ underlying rhythm from 4/22 (pictured below.) Advised pt of 6 month driving restrictions. Will review w/ MD.       06/11/20 underlying rhythm

## 2023-04-12 NOTE — Telephone Encounter (Signed)
 Pt agreeable and verbalized understanding of need for appointment. ED precautions given. Advised of driving restrictions. Needs appointment w/ EP APP in the next 2-3 weeks with EP APP for f/u to ATP.

## 2023-04-25 NOTE — Progress Notes (Unsigned)
 Electrophysiology Office Note:   Date:  04/27/2023  ID:  Donald Bond, DOB Apr 21, 1960, MRN [id number]  Primary Cardiologist: None Primary Heart Failure: None Electrophysiologist: Maurice Small, MD       History of Present Illness:   Donald Bond is a 63 y.o. male with h/o HFrEF s/p BiV ICD, HOCM, permanent AF s/p AV node ablation, severe MR seen today for acute visit due to device notification of ATP.    The Device Clinic received an alert transmission for ATP on that occurred on 04/09/23 that fell into the VT-1 zone, lasting 31 sec in duration. EGM consistent with VT & appropriately treated with ATP x1 burst, followed by additional 11 beats before self terminated, ave V rate 166 bpm. Other episodes of NSVT on 03/02/23 longest episode lasting 15 sec.  The patient was called and he was asymptomatic. He was advised of 6 month driving restrictions.   Patient reports doing well overall.  Was not aware of the events above that occurred on 04/09/23. Reports his weight has been stable, compliant with medications.   He denies chest pain, palpitations, dyspnea, PND, orthopnea, nausea, vomiting, dizziness, syncope, edema, weight gain, or early satiety.   Review of systems complete and found to be negative unless listed in HPI.   EP Information / Studies Reviewed:    EKG is ordered today. Personal review as below.      ICD Interrogation-  reviewed in detail today,  See PACEART report.  Device History: [employer] BiV ICD implanted 05/31/2020 for HFrEF, prior RV lead / PPM implanted in 10/15/1998 History of appropriate therapy: Yes, ATP 04/12/23 History of AAD therapy: No   Studies:  EPS 07/2022 > AV node ablation ECHO 08/2022 > LVEF 50-55%, severe concentric LVH, LA severely dilated     Arrhythmia / AAD AF s/p AV Node Ablation 07/2022   Risk Assessment/Calculations:    CHA2DS2-VASc Score = 1   This indicates a 0.6% annual risk of stroke. The patient's score is based upon: CHF  History: 1 HTN History: 0 Diabetes History: 0 Stroke History: 0 Vascular Disease History: 0 Age Score: 0 Gender Score: 0              Physical Exam:   VS:  BP 110/78   Pulse 77   Ht 5\' 7"  (1.702 m)   Wt 176 lb (79.8 kg)   SpO2 96%   BMI 27.57 kg/m    Wt Readings from Last 3 Encounters:  04/27/23 176 lb (79.8 kg)  10/29/22 175 lb (79.4 kg)  08/04/22 182 lb 9.6 oz (82.8 kg)     GEN: Well nourished, well developed in no acute distress NECK: No JVD; No carotid bruits CARDIAC: Regular rate and rhythm, no murmurs, rubs, gallops RESPIRATORY:  Clear to auscultation without rales, wheezing or rhonchi  ABDOMEN: Soft, non-tender, non-distended EXTREMITIES:  No edema; No deformity   ASSESSMENT AND PLAN:    VT  Chronic Systolic Dysfunction / HOCM s/p [employer] CRT-D  LBBB  Severe focal basal septal hypertrophy  -92% BiV pacing  -euvolemic today -Stable on an appropriate medical regimen -Normal ICD function -See Pace Art report -continue metoprolol XL > added additional 25mg  at bedtime for total 125 mg (not much room on BP) -GDMT per HF Team  -update labs with VT > BMP, CBC  -reviewed driving restrictions with ATP > 6 months no driving from 2/7   PVC's  -unable to determine PVC burden on device due to programming  -  continue metoprolol XL   Permanent AF s/p AV Node Ablation  -continue BB -digoxin 0.125 mg daily   Secondary Hypercoagulable State  -continue Eliquis 5mg  BID, dose reviewed and appropriate by age/wt    Disposition:   Follow up with Dr. Nelly Laurence in 6 months or sooner if new needs arise.    Signed, Canary Brim, NP-C, AGACNP-BC [facility] - Electrophysiology  04/27/2023, 6:02 PM

## 2023-04-26 ENCOUNTER — Ambulatory Visit (INDEPENDENT_AMBULATORY_CARE_PROVIDER_SITE_OTHER): Payer: Self-pay

## 2023-04-26 DIAGNOSIS — I422 Other hypertrophic cardiomyopathy: Secondary | ICD-10-CM | POA: Diagnosis not present

## 2023-04-26 DIAGNOSIS — I5022 Chronic systolic (congestive) heart failure: Secondary | ICD-10-CM

## 2023-04-26 LAB — CUP PACEART REMOTE DEVICE CHECK
Battery Remaining Longevity: 90 mo
Battery Remaining Percentage: 100 %
Brady Statistic RA Percent Paced: 0 %
Brady Statistic RV Percent Paced: 80 %
Date Time Interrogation Session: 20250224040100
HighPow Impedance: 46 Ohm
Implantable Lead Connection Status: 753985
Implantable Lead Connection Status: 753985
Implantable Lead Connection Status: 753985
Implantable Lead Implant Date: 20000815
Implantable Lead Implant Date: 20220401
Implantable Lead Implant Date: 20220401
Implantable Lead Location: 753858
Implantable Lead Location: 753859
Implantable Lead Location: 753860
Implantable Lead Model: 144
Implantable Lead Model: 7841
Implantable Lead Serial Number: 1129476
Implantable Lead Serial Number: 334759
Implantable Pulse Generator Implant Date: 20220401
Lead Channel Impedance Value: 1179 Ohm
Lead Channel Impedance Value: 765 Ohm
Lead Channel Pacing Threshold Amplitude: 0.8 V
Lead Channel Pacing Threshold Pulse Width: 0.4 ms
Lead Channel Setting Pacing Amplitude: 2 V
Lead Channel Setting Pacing Amplitude: 3 V
Lead Channel Setting Pacing Pulse Width: 0.4 ms
Lead Channel Setting Pacing Pulse Width: 0.5 ms
Lead Channel Setting Sensing Sensitivity: 0.5 mV
Lead Channel Setting Sensing Sensitivity: 1 mV
Pulse Gen Serial Number: 389080

## 2023-04-27 ENCOUNTER — Encounter: Payer: Self-pay | Admitting: Pulmonary Disease

## 2023-04-27 ENCOUNTER — Ambulatory Visit: Payer: Managed Care, Other (non HMO) | Attending: Pulmonary Disease | Admitting: Pulmonary Disease

## 2023-04-27 VITALS — BP 110/78 | HR 77 | Ht 67.0 in | Wt 176.0 lb

## 2023-04-27 DIAGNOSIS — D6869 Other thrombophilia: Secondary | ICD-10-CM

## 2023-04-27 DIAGNOSIS — Z95810 Presence of automatic (implantable) cardiac defibrillator: Secondary | ICD-10-CM

## 2023-04-27 DIAGNOSIS — I422 Other hypertrophic cardiomyopathy: Secondary | ICD-10-CM

## 2023-04-27 DIAGNOSIS — I493 Ventricular premature depolarization: Secondary | ICD-10-CM

## 2023-04-27 DIAGNOSIS — I447 Left bundle-branch block, unspecified: Secondary | ICD-10-CM

## 2023-04-27 DIAGNOSIS — I5022 Chronic systolic (congestive) heart failure: Secondary | ICD-10-CM

## 2023-04-27 DIAGNOSIS — I4821 Permanent atrial fibrillation: Secondary | ICD-10-CM

## 2023-04-27 DIAGNOSIS — I472 Ventricular tachycardia, unspecified: Secondary | ICD-10-CM

## 2023-04-27 LAB — CBC

## 2023-04-27 MED ORDER — METOPROLOL SUCCINATE ER 25 MG PO TB24: 25.0000 mg | ORAL_TABLET | Freq: Every day | ORAL | 3 refills | Status: DC

## 2023-04-27 NOTE — Patient Instructions (Addendum)
 Medication Instructions:  Add metoprolol succinate (Toprol XL) 25 mg daily at bedtime.  *If you need a refill on your cardiac medications before your next appointment, please call your pharmacy*  Lab Work: CBC, BMET-TODAY If you have labs (blood work) drawn today and your tests are completely normal, you will receive your results only by: MyChart Message (if you have MyChart) OR A paper copy in the mail If you have any lab test that is abnormal or we need to change your treatment, we will call you to review the results.  Follow-Up: At [facility], you and your health needs are our priority.  As part of our continuing mission to provide you with exceptional heart care, we have created designated Provider Care Teams.  These Care Teams include your primary Cardiologist (physician) and Advanced Practice Providers (APPs -  Physician Assistants and Nurse Practitioners) who all work together to provide you with the care you need, when you need it.  Your next appointment:   6 month(s)  Provider:   York Pellant, MD or Canary Brim, NP

## 2023-04-28 LAB — BASIC METABOLIC PANEL
BUN/Creatinine Ratio: 14 (ref 10–24)
BUN: 13 mg/dL (ref 8–27)
CO2: 24 mmol/L (ref 20–29)
Calcium: 9.8 mg/dL (ref 8.6–10.2)
Chloride: 104 mmol/L (ref 96–106)
Creatinine, Ser: 0.96 mg/dL (ref 0.76–1.27)
Glucose: 89 mg/dL (ref 70–99)
Potassium: 4.6 mmol/L (ref 3.5–5.2)
Sodium: 140 mmol/L (ref 134–144)
eGFR: 89 mL/min/{1.73_m2} (ref >59)

## 2023-04-28 LAB — CBC
Hematocrit: 49.7 % (ref 37.5–51.0)
Hemoglobin: 16.5 g/dL (ref 13.0–17.7)
MCH: 30.5 pg (ref 26.6–33.0)
MCHC: 33.2 g/dL (ref 31.5–35.7)
MCV: 92 fL (ref 79–97)
Platelets: 236 10*3/uL (ref 150–450)
RBC: 5.41 x10E6/uL (ref 4.14–5.80)
RDW: 13.2 % (ref 11.6–15.4)
WBC: 7 10*3/uL (ref 3.4–10.8)

## 2023-05-18 ENCOUNTER — Encounter: Payer: Self-pay | Admitting: Internal Medicine

## 2023-06-01 NOTE — Progress Notes (Signed)
 Remote ICD transmission.

## 2023-06-03 ENCOUNTER — Telehealth: Payer: Self-pay | Admitting: Cardiovascular Disease

## 2023-06-03 MED ORDER — ENTRESTO 49-51 MG PO TABS: 1.0000 | ORAL_TABLET | Freq: Two times a day (BID) | ORAL | 2 refills | Status: AC

## 2023-06-03 NOTE — Telephone Encounter (Signed)
*  STAT* If patient is at the pharmacy, call can be transferred to refill team.   1. Which medications need to be refilled? (please list name of each medication and dose if known)   sacubitril-valsartan (ENTRESTO) 49-51 MG    2. Which pharmacy/location (including street and city if local pharmacy) is medication to be sent to?  [id number] - [place], [place] - [address]    3. Do they need a 30 day or 90 day supply? 90

## 2023-06-15 ENCOUNTER — Other Ambulatory Visit (HOSPITAL_COMMUNITY): Payer: Self-pay

## 2023-06-15 DIAGNOSIS — I5022 Chronic systolic (congestive) heart failure: Secondary | ICD-10-CM

## 2023-06-15 MED ORDER — ROSUVASTATIN CALCIUM 10 MG PO TABS
10.0000 mg | ORAL_TABLET | Freq: Every day | ORAL | 0 refills | Status: DC
Start: 2023-06-15 — End: 2023-09-13

## 2023-06-16 ENCOUNTER — Other Ambulatory Visit (HOSPITAL_COMMUNITY): Payer: Self-pay | Admitting: Cardiology

## 2023-06-18 ENCOUNTER — Other Ambulatory Visit (HOSPITAL_COMMUNITY): Payer: Self-pay | Admitting: Cardiology

## 2023-06-25 ENCOUNTER — Other Ambulatory Visit (HOSPITAL_COMMUNITY): Payer: Self-pay

## 2023-07-16 ENCOUNTER — Telehealth (HOSPITAL_COMMUNITY): Payer: Self-pay | Admitting: Cardiology

## 2023-07-16 ENCOUNTER — Other Ambulatory Visit (HOSPITAL_COMMUNITY): Payer: Self-pay

## 2023-07-16 DIAGNOSIS — I5022 Chronic systolic (congestive) heart failure: Secondary | ICD-10-CM

## 2023-07-16 MED ORDER — EMPAGLIFLOZIN 10 MG PO TABS
10.0000 mg | ORAL_TABLET | Freq: Every day | ORAL | 2 refills | Status: DC
Start: 2023-07-16 — End: 2023-08-11

## 2023-07-16 NOTE — Telephone Encounter (Signed)
 Front office recieved message from [place] B, CMA that this patient needs a f/u appt with Dr. Mitzie Anda in the [facility]. Front office personnel was not able to speak to pt directly so front office left patient a voice message to call front office back.

## 2023-07-27 ENCOUNTER — Ambulatory Visit (INDEPENDENT_AMBULATORY_CARE_PROVIDER_SITE_OTHER): Payer: Self-pay

## 2023-07-27 DIAGNOSIS — I422 Other hypertrophic cardiomyopathy: Secondary | ICD-10-CM | POA: Diagnosis not present

## 2023-07-27 DIAGNOSIS — I5022 Chronic systolic (congestive) heart failure: Secondary | ICD-10-CM

## 2023-07-28 LAB — CUP PACEART REMOTE DEVICE CHECK
Battery Remaining Longevity: 90 mo
Battery Remaining Percentage: 100 %
Brady Statistic RA Percent Paced: 0 %
Brady Statistic RV Percent Paced: 89 %
Date Time Interrogation Session: 20250527040200
HighPow Impedance: 50 Ohm
Implantable Lead Connection Status: 753985
Implantable Lead Connection Status: 753985
Implantable Lead Connection Status: 753985
Implantable Lead Implant Date: 20000815
Implantable Lead Implant Date: 20220401
Implantable Lead Implant Date: 20220401
Implantable Lead Location: 753858
Implantable Lead Location: 753859
Implantable Lead Location: 753860
Implantable Lead Model: 144
Implantable Lead Model: 7841
Implantable Lead Serial Number: 1129476
Implantable Lead Serial Number: 334759
Implantable Pulse Generator Implant Date: 20220401
Lead Channel Impedance Value: 1069 Ohm
Lead Channel Impedance Value: 884 Ohm
Lead Channel Pacing Threshold Amplitude: 0.6 V
Lead Channel Pacing Threshold Pulse Width: 0.4 ms
Lead Channel Setting Pacing Amplitude: 2 V
Lead Channel Setting Pacing Amplitude: 3 V
Lead Channel Setting Pacing Pulse Width: 0.4 ms
Lead Channel Setting Pacing Pulse Width: 0.5 ms
Lead Channel Setting Sensing Sensitivity: 0.5 mV
Lead Channel Setting Sensing Sensitivity: 1 mV
Pulse Gen Serial Number: 389080

## 2023-08-04 ENCOUNTER — Ambulatory Visit: Payer: Self-pay | Admitting: Cardiovascular Disease

## 2023-08-10 ENCOUNTER — Telehealth (HOSPITAL_COMMUNITY): Payer: Self-pay

## 2023-08-10 NOTE — Telephone Encounter (Signed)
 Called to confirm/remind patient of their appointment at the [facility] on 08/11/23.   Appointment:   [] Confirmed  [x] Left mess   [] No answer/No voice mail  [] VM Full/unable to leave message  [] Phone not in service And to bring in all medications and/or complete list.

## 2023-08-11 ENCOUNTER — Ambulatory Visit (HOSPITAL_COMMUNITY): Payer: Self-pay | Admitting: Family Medicine

## 2023-08-11 ENCOUNTER — Ambulatory Visit (HOSPITAL_COMMUNITY)
Admission: RE | Admit: 2023-08-11 | Discharge: 2023-08-11 | Disposition: A | Discharge status: Home / Self Care | Source: Ambulatory Visit | Attending: Family Medicine | Admitting: Family Medicine

## 2023-08-11 ENCOUNTER — Encounter (HOSPITAL_COMMUNITY): Payer: Self-pay

## 2023-08-11 VITALS — BP 130/84 | HR 70 | Wt 179.6 lb

## 2023-08-11 DIAGNOSIS — Z7901 Long term (current) use of anticoagulants: Secondary | ICD-10-CM | POA: Insufficient documentation

## 2023-08-11 DIAGNOSIS — I472 Ventricular tachycardia, unspecified: Secondary | ICD-10-CM | POA: Insufficient documentation

## 2023-08-11 DIAGNOSIS — I340 Nonrheumatic mitral (valve) insufficiency: Secondary | ICD-10-CM | POA: Insufficient documentation

## 2023-08-11 DIAGNOSIS — Z79899 Other long term (current) drug therapy: Secondary | ICD-10-CM | POA: Insufficient documentation

## 2023-08-11 DIAGNOSIS — I422 Other hypertrophic cardiomyopathy: Secondary | ICD-10-CM | POA: Diagnosis present

## 2023-08-11 DIAGNOSIS — Z7984 Long term (current) use of oral hypoglycemic drugs: Secondary | ICD-10-CM | POA: Insufficient documentation

## 2023-08-11 DIAGNOSIS — I2510 Atherosclerotic heart disease of native coronary artery without angina pectoris: Secondary | ICD-10-CM | POA: Diagnosis not present

## 2023-08-11 DIAGNOSIS — Z8241 Family history of sudden cardiac death: Secondary | ICD-10-CM | POA: Insufficient documentation

## 2023-08-11 DIAGNOSIS — I5020 Unspecified systolic (congestive) heart failure: Secondary | ICD-10-CM | POA: Diagnosis present

## 2023-08-11 DIAGNOSIS — I5022 Chronic systolic (congestive) heart failure: Secondary | ICD-10-CM | POA: Insufficient documentation

## 2023-08-11 DIAGNOSIS — I4821 Permanent atrial fibrillation: Secondary | ICD-10-CM

## 2023-08-11 DIAGNOSIS — Z95810 Presence of automatic (implantable) cardiac defibrillator: Secondary | ICD-10-CM | POA: Insufficient documentation

## 2023-08-11 DIAGNOSIS — I480 Paroxysmal atrial fibrillation: Secondary | ICD-10-CM | POA: Diagnosis not present

## 2023-08-11 LAB — BASIC METABOLIC PANEL WITH GFR
Anion gap: 5 (ref 5–15)
BUN: 17 mg/dL (ref 8–23)
CO2: 27 mmol/L (ref 22–32)
Calcium: 9.6 mg/dL (ref 8.9–10.3)
Chloride: 106 mmol/L (ref 98–111)
Creatinine, Ser: 0.91 mg/dL (ref 0.61–1.24)
GFR, Estimated: >60 mL/min (ref >60)
Glucose, Bld: 96 mg/dL (ref 70–99)
Potassium: 4.7 mmol/L (ref 3.5–5.1)
Sodium: 138 mmol/L (ref 135–145)

## 2023-08-11 LAB — DIGOXIN LEVEL: Digoxin Level: 0.8 ng/mL (ref 0.8–2.0)

## 2023-08-11 LAB — CBC
HCT: 48.2 % (ref 39.0–52.0)
Hemoglobin: 16.4 g/dL (ref 13.0–17.0)
MCH: 30.5 pg (ref 26.0–34.0)
MCHC: 34 g/dL (ref 30.0–36.0)
MCV: 89.8 fL (ref 80.0–100.0)
Platelets: 183 10*3/uL (ref 150–400)
RBC: 5.37 MIL/uL (ref 4.22–5.81)
RDW: 13.1 % (ref 11.5–15.5)
WBC: 9.6 10*3/uL (ref 4.0–10.5)
nRBC: 0 % (ref 0.0–0.2)

## 2023-08-11 LAB — LIPID PANEL
Cholesterol: 117 mg/dL (ref 0–200)
HDL: 52 mg/dL (ref >40)
LDL Cholesterol: 59 mg/dL (ref 0–99)
Total CHOL/HDL Ratio: 2.3 ratio
Triglycerides: 28 mg/dL (ref <150)
VLDL: 6 mg/dL (ref 0–40)

## 2023-08-11 MED ORDER — EMPAGLIFLOZIN 10 MG PO TABS
10.0000 mg | ORAL_TABLET | Freq: Every day | ORAL | 1 refills | Status: DC
Start: 2023-08-11 — End: 2023-10-20

## 2023-08-11 MED ORDER — DIGOXIN 125 MCG PO TABS: 125.0000 ug | ORAL_TABLET | Freq: Every day | ORAL | 1 refills | Status: DC

## 2023-08-11 MED ORDER — SPIRONOLACTONE 25 MG PO TABS: 25.0000 mg | ORAL_TABLET | Freq: Every day | ORAL | 3 refills | Status: DC

## 2023-08-11 NOTE — Patient Instructions (Addendum)
 Good to see you today!  Labs done today, your results will be available in MyChart, we will contact you for abnormal readings.  Your physician recommends that you schedule a follow-up appointment 4 months(October) Call office in August to schedule  Your physician has requested that you have an echocardiogram. Echocardiography is a painless test that uses sound waves to create images of your heart. It provides your doctor with information about the size and shape of your heart and how well your heart's chambers and valves are working. This procedure takes approximately one hour. There are no restrictions for this procedure. Please do NOT wear cologne, perfume, aftershave, or lotions (deodorant is allowed). Please arrive 15 minutes prior to your appointment time.  Please note: We ask at that you not bring children with you during ultrasound (echo/ vascular) testing. Due to room size and safety concerns, children are not allowed in the ultrasound rooms during exams. Our front office staff cannot provide observation of children in our lobby area while testing is being conducted. An adult accompanying a patient to their appointment will only be allowed in the ultrasound room at the discretion of the ultrasound technician under special circumstances. We apologize for any inconvenience.  If you have any questions or concerns before your next appointment please send us  a message through [place] or call our office at [phone].    TO LEAVE A MESSAGE FOR THE NURSE SELECT OPTION 2, PLEASE LEAVE A MESSAGE INCLUDING: YOUR NAME DATE OF BIRTH CALL BACK NUMBER REASON FOR CALL**this is important as we prioritize the call backs  YOU WILL RECEIVE A CALL BACK THE SAME DAY AS LONG AS YOU CALL BEFORE 4:00 PM At the [facility], you and your health needs are our priority. As part of our continuing mission to provide you with exceptional heart care, we have created designated Provider Care Teams.  These Care Teams include your primary Cardiologist (physician) and Advanced Practice Providers (APPs- Physician Assistants and Nurse Practitioners) who all work together to provide you with the care you need, when you need it.   You may see any of the following providers on your designated Care Team at your next follow up: Dr Jules Oar Dr Peder Bourdon Dr. Alwin Baars Dr. Arta Lark Amy Marijane Shoulders, NP Ruddy Corral, [place] [facility] [place], [place] Dennise Fitz, NP [place] Lee, NP Shawnee Dellen, NP Luster Salters, PharmD Bevely Brush, PharmD   Please be sure to bring in all your medications bottles to every appointment.    Thank you for choosing [facility]

## 2023-08-11 NOTE — Progress Notes (Signed)
 PCP: Medicine, [facility] (Inactive) EP: Dr. Rodolfo Clan HF Cardiology: Dr. Mitzie Anda  63 y.o. with hypertrophic nonobstructive cardiomyopathy (gene+) was referred by Dr. Rodolfo Clan for evaluation of newly noted systolic dysfunction.  Patient has HCM as evidenced by severe asymmetric septal hypertrophy.  He does not have LVOT obstruction or SAM.  His brother had SCD at 75, presumable due to HCM, and patient has a Environmental manager ICD.  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  Patient has struggled with atrial fibrillation.  He was seen by Dr. Nunzio Belch a couple of years ago and decided against AF ablation.  He is currently maintained on Tikosyn .  He was on ranolazone + Tikosyn  but developed VT x 2 on this combination and ranolazine  was stopped.  Most recently, had DCCV 01/03/20.  He is now back in atrial fibrillation.  He feels the atrial fibrillation when his rate gets fast (>100 bpm).  Today, rate is controlled in the 70s.  Echo in 4/18 showed EF 55-60%, but echo in 9/21 showed EF down to 25-30% with moderate-severe MR (no SAM).   Patient had RHC/LHC in 12/21 showing 90% stenosis in small ostial D1 and small ostial OM2, no interventional target.  Cardiac output was preserved with normal filling pressures.  TEE showed EF 30-35% with 3+ mitral regurgitation.   Patient saw Dr. Alva Jewels for evaluation for MV repair, surgical Maze, epicardial LV lead.  Stress echo in 2/22 showed mild MR at rest and moderate MR at stress.  It was decided that MR was not severe enough to warrant surgery.   CPX in 2/22 showed moderate-severe HF functional limitation.   Patient had CRT upgrade in 4/22 then DCCV later in 4/22 to NSR. He had a recurrence of atrial flutter with DCCV in 6/22.  Cardioversion lasted less than a day before he was back in atrial flutter again. Patient was evaluated at [facility] for 2nd opinion on atrial  flutter/fibrillation ablation, but they also thought that he would be unlikely to hold NSR.   Echo was done in 10/22, showing EF 30-35% with severe focal basal septal hypertrophy, no LVOT obstruction, no SAM, moderate MR, mild RV enlargement with mildly decreased systolic function, severe LAE, and normal IVC.   Echo 12/23 showing EF 45-50%, severe focal basal septal hypertrophy with no LVOT obstruction, no SAM, mild-moderate MR, normal RV, normal IVC.   s/p AVJ ablation 5/24 with Dr. Arlester Ladd. Post procedure follow up, bisoprolol  switched to Toprol  XL due to episode of symptomatic NSVT on device interrogation.   Received ATP 04/09/23, he was asymptomatic. Toprol  increased to 125 mg.   Today he returns for HF follow up. Overall feeling fine. He is not SOB with activity, fatigue has improved. Has rare atypical CP. Denies palpitations, abnormal bleeding, dizziness, edema, or PND/Orthopnea. Appetite ok. He is not weighing at home. Taking all medications. Works full time as an Chief Executive Officer.   [employer] ICD interrogation (personally reviewed): HL Score 2, average HR 72 bpm, BiV pacing 90%, 2 hr/day activity, severl short runs of NSVT, VT on 05/27/23 & 06/09/23 => no therapy.  ECG (personally reviewed from 04/27/23): V paced with PVC, AFL  Labs (4/24): K 4.4, creatinine 0.99 Labs (2/25): K 4.6, creatinine 0.96  PMH: 1. Atrial fibrillation/flutter: Was on ranolazine  + Tikosyn  but had VT, ranolazine  stopped.  Now on Tikosyn .  - DCCV 11/21. - DCCV 4/22 - DCCV 6/22: lasted < 1  day.  2. Hypertrophic cardiomyopathy: No LVOT obstruction or SAM.  Gene+.  Brother with SCD at 63.  3. Chronic systolic CHF: Nonischemic cardiomyopathy, likely due to burned out HCM.  [employer] CRT-D device.  - Echo (4/18): Severe asymmetric septal hypertrophy, no SAM, moderate MR, EF 55-60%.  - Echo (9/21): Severe asymmetric septal hypertrophy, no SAM or LVOT obstruction, EF 25-30%, mildly decreased RV systolic  function, PASP 60 mmHg, severe LAE, severe MR with restricted posterior leaflet.  - TEE (12/21): severe asymmetric basal septal hypertrophy, EF 30-35%, mildly decreased RV systolic function, severe LAE, moderate TR, mild AI, restricted posterior MV leaflet with 2 MR jets, ERO 0.3 (suspect 3+ MR, Carpentier IIIb).  - LHC/RHC (12/21): 90% stenosis in small ostial D1 and small ostial OM2, no interventional target. Mean RA 3, PA 38/14, mean PCWP 14 with v waves to 22, CI 2.66, PVR 2.1 WU.  - CPX (2/22): RER 1.28, VE/VCO2 47, peak VO2 18.5.  Moderate-severe functional limitation due to HF, suspect severe elevation LA pressure/PA pressure with exercise.  - 4/22 upgrade to [facility] Scientific CRT-D.  - Echo (10/22): EF 30-35% with severe focal basal septal hypertrophy, no LVOT obstruction, no SAM, moderate MR, mild RV enlargement with mildly decreased systolic function, severe LAE, and normal IVC.  - Echo (12/23): EF 45-50%, severe focal basal septal hypertrophy with no LVOT obstruction, no SAM, mild-moderate MR, normal RV, normal IVC. 4. VT: 9/21, suspected to be due to ranolazine  + Tikosyn  combination, ranolazine  stopped.  5. H/o nephrolithiasis.  6. Mitral regurgitation: Moderate to severe by my review of 9/21 echo,  restriction of posterior leaflet.  - TEE (12/21): Restricted posterior MV leaflet with 2 MR jets, ERO 0.3 (suspect 3+ MR, Carpentier IIIb). - Stress echo (2/22): mild MR at rest, moderate MR with stress.  - Moderate on 10/22 echo.  7. LBBB  FH: Mother with atrial fibrillation.  Brother with SCD at 34.  Son also with HCM gene+.   Social History   Socioeconomic History   Marital status: Married    Spouse name: Not on file   Number of children: Not on file   Years of education: Not on file   Highest education level: Not on file  Occupational History   Not on file  Tobacco Use   Smoking status: Never   Smokeless tobacco: Former    Types: Chew    Quit date: 1995  Vaping Use    Vaping status: Never Used  Substance and Sexual Activity   Alcohol use: Yes    Alcohol/week: 3.0 standard drinks of alcohol    Types: 3 Cans of beer per week   Drug use: No   Sexual activity: Yes  Other Topics Concern   Not on file  Social History Narrative   Lives with spouse in [place]   employed   Social Drivers of Health   Financial Resource Strain: Not on file  Food Insecurity: No Food Insecurity (07/24/2021)   Received from [facility], [facility]   Hunger Vital Sign    Worried About Running Out of Food in the Last Year: Never true    Ran Out of Food in the Last Year: Never true  Transportation Needs: Not on file  Physical Activity: Not on file  Stress: Not on file  Social Connections: Unknown (07/02/2021)   Received from [facility], [facility]   Social Network    Social Network: Not on file  Intimate Partner Violence: Unknown (06/04/2021)   [facility]   HITS    Physically Hurt: Not on file    Insult or Talk Down To: Not on file    Threaten Physical Harm: Not on file    Scream or Curse: Not on file   ROS: All systems reviewed and negative except as per HPI.   Current Outpatient Medications  Medication Sig Dispense Refill   acetaminophen  (TYLENOL ) 325 MG tablet Take 650 mg by mouth every 6 (six) hours as needed for mild pain or moderate pain (for pain.).     digoxin  (LANOXIN ) 0.125 MG tablet TAKE 1 TABLET BY MOUTH EVERY DAY 49 tablet 2   ELIQUIS  5 MG TABS tablet TAKE 1 TABLET(5 MG) BY MOUTH IN THE MORNING AND AT BEDTIME 60 tablet 1   empagliflozin  (JARDIANCE ) 10 MG TABS tablet Take 1 tablet (10 mg total) by mouth daily. 30 tablet 2   loratadine  (CLARITIN ) 10 MG tablet Take 10 mg by mouth daily as needed (seasonal allergies).     magnesium  oxide (MAG-OX) 400 MG tablet TAKE 1 TABLET BY MOUTH EVERY DAY 30 tablet 11   metoprolol  succinate (TOPROL  XL) 25 MG 24 hr tablet Take 1 tablet (25 mg total) by mouth at bedtime. This will  be in addition to the 100 mg taken daily to equal 125 mg total daily 90 tablet 3   metoprolol  succinate (TOPROL -XL) 100 MG 24 hr tablet Take 1 tablet (100 mg total) by mouth daily. Take with or immediately following a meal. 90 tablet 3   rosuvastatin  (CRESTOR ) 10 MG tablet Take 1 tablet (10 mg total) by mouth daily. 90 tablet 0   sacubitril-valsartan (ENTRESTO ) 49-51 MG Take 1 tablet by mouth 2 (two) times daily. 180 tablet 2   spironolactone  (ALDACTONE ) 25 MG tablet Take 1 tablet (25 mg total) by mouth daily. 90 tablet 3   No current facility-administered medications for this encounter.   Wt Readings from Last 3 Encounters:  08/11/23 81.5 kg (179 lb 9.6 oz)  04/27/23 79.8 kg (176 lb)  10/29/22 79.4 kg (175 lb)   BP 130/84   Pulse 70   Wt 81.5 kg (179 lb 9.6 oz)   SpO2 97%   BMI 28.13 kg/m  Physical Exam General:  NAD. No resp difficulty HEENT: Normal Neck: Supple. No JVD. Cor: Regular rate & rhythm. No rubs, gallops or murmurs. Lungs: Clear Abdomen: Soft, nontender, nondistended.  Extremities: No cyanosis, clubbing, rash, edema Neuro: Alert & oriented x 3, moves all 4 extremities w/o difficulty. Affect pleasant.   Assessment/Plan: 1. Chronic systolic CHF: Echo in 9/21 showed severe asymmetric septal hypertrophy, no SAM or LVOT obstruction, EF 25-30%, mildly decreased RV systolic function, PASP 60 mmHg, severe LAE, severe MR with restricted posterior leaflet.  EF was lower than in the past (normal in 4/18) and mitral regurgitation was worse.  LHC/RHC in 12/21 showed 90% stenosis small D1 and 90% stenosis small OM2 (CAD does not explain cardiomyopathy) and normal filling pressures/preserved cardiac output.  Cardiomyopathy could be natural history of HCM with worsening of function over time.  CPX in 2/22 showed moderate-severe functional limitation.  [employer] CRT-D upgrade in 4/22.  He is now in persistent atrial fibrillation/atypical flutter, failed 6/22 DCCV.  Echo 12/23 showed  improved EF 45-50%, severe focal basal septal hypertrophy with no LVOT obstruction, no SAM, mild-moderate MR, normal RV, normal IVC. Given permanent AF/AFL, he is now post AV nodal ablation to maximize BiV pacing. Device interrogation today shows 90% BiV pacing percentage. ?  PVCs related to decreased BiV pacing. NYHA class II, not volume overloaded.   - Continue Toprol  XL 125 mg daily. - Continue Entresto  49/51 bid.   - Continue Jardiance  10 mg daily.   - Continue digoxin  0.125 mg daily (for rate control). Check dig level today. - Continue spironolactone  25 mg daily. BMET today.  - I do not think he needs Lasix.   - Update echo. 2. Atrial fibrillation/flutter: Paroxysmal => chronic.  He had DCCV 01/03/20 but went back into atrial fibrillation despite Tikosyn  use.  He is off ranolazine  due to VT with combination of Tikosyn  and ranolazine .  Atrial fibrillation ablation likely has a lower chance of success here due to the size of the left atrium (severely enlarged).  DCCV in 4/22 was successful but flutter recurred. DCCV in 6/22 only held for about a day. He was seen at [facility] for 2nd opinion, did not think ablation would be successful.  He is now off Tikosyn  due to futility. Would not try DCCV again. S/p AVJ ablation (5/24). - Continue Toprol  XL 125 mg daily. - Continue apixaban  5 mg bid, no bleeding issues. CBC today. 3. Mitral regurgitation: TEE in 12/21 with 3+ MR, Carpentier IIIb.  Stress echo in 4/22 showed moderate MR with stress.  He saw Dr. Alva Jewels, MR not thought bad enough to warrant surgical repair. CRT upgrade may help limit MR in the long-term. Mild-moderate MR on most recent  echo.  4. Hypertrophic cardiomyopathy: No SAM or LVOT obstruction, has asymmetric septal hypertrophy.  Gene +, brother with SCD at 87.   - Update echo. 5. VT: In 9/21, suspect due to combination of ranolazone and Tikosyn  with QT prolongation.  Now off both. Received ATP 04/2023.  - Follows with EP. - Device interrogation as  above. 6. CAD: LHC (12/21) with 90% ostial small D1 and 90% ostial small OM2.  CAD does not explain the extent of his cardiomyopathy.  Rare atypical chest pain. - Continue Crestor  10 mg daily. Check lipids today. - No ASA given Eliquis  use.   Update echo. Follow up in 4 months with Dr. Mitzie Anda.   Arlice Bene [facility] FNP-BC 08/11/2023

## 2023-08-15 ENCOUNTER — Other Ambulatory Visit (HOSPITAL_COMMUNITY): Payer: Self-pay | Admitting: Cardiology

## 2023-08-15 ENCOUNTER — Other Ambulatory Visit: Payer: Self-pay | Admitting: Cardiovascular Disease

## 2023-08-15 DIAGNOSIS — I472 Ventricular tachycardia, unspecified: Secondary | ICD-10-CM

## 2023-08-25 ENCOUNTER — Ambulatory Visit (HOSPITAL_COMMUNITY)
Admission: RE | Admit: 2023-08-25 | Discharge: 2023-08-25 | Disposition: A | Discharge status: Home / Self Care | Source: Ambulatory Visit | Attending: Family Medicine | Admitting: Family Medicine

## 2023-08-25 DIAGNOSIS — I083 Combined rheumatic disorders of mitral, aortic and tricuspid valves: Secondary | ICD-10-CM | POA: Diagnosis not present

## 2023-08-25 DIAGNOSIS — I5022 Chronic systolic (congestive) heart failure: Secondary | ICD-10-CM | POA: Diagnosis present

## 2023-08-25 LAB — ECHOCARDIOGRAM COMPLETE
Area-P 1/2: 4.49 cm2
Calc EF: 27.7 %
S' Lateral: 2.6 cm
Single Plane A2C EF: 35.7 %
Single Plane A4C EF: 18.7 %

## 2023-08-26 ENCOUNTER — Other Ambulatory Visit: Payer: Self-pay | Admitting: Pulmonary Disease

## 2023-09-11 ENCOUNTER — Other Ambulatory Visit (HOSPITAL_COMMUNITY): Payer: Self-pay | Admitting: Cardiology

## 2023-09-11 DIAGNOSIS — I5022 Chronic systolic (congestive) heart failure: Secondary | ICD-10-CM

## 2023-09-14 NOTE — Progress Notes (Signed)
 Remote ICD transmission.

## 2023-09-14 NOTE — Addendum Note (Signed)
 Addended by: TAWNI DRILLING D on: 09/14/2023 05:18 PM   Modules accepted: Orders

## 2023-09-20 ENCOUNTER — Ambulatory Visit (HOSPITAL_COMMUNITY): Payer: Self-pay | Admitting: Cardiology

## 2023-09-20 ENCOUNTER — Other Ambulatory Visit: Payer: Self-pay

## 2023-09-20 ENCOUNTER — Encounter (INDEPENDENT_AMBULATORY_CARE_PROVIDER_SITE_OTHER): Payer: Self-pay | Admitting: Cardiology

## 2023-09-20 ENCOUNTER — Ambulatory Visit (HOSPITAL_COMMUNITY)
Admission: RE | Admit: 2023-09-20 | Discharge: 2023-09-20 | Disposition: A | Discharge status: Home / Self Care | Source: Ambulatory Visit | Attending: Cardiology | Admitting: Cardiology

## 2023-09-20 VITALS — BP 110/78 | HR 70 | Wt 182.8 lb

## 2023-09-20 DIAGNOSIS — R5383 Other fatigue: Secondary | ICD-10-CM | POA: Diagnosis not present

## 2023-09-20 DIAGNOSIS — R0683 Snoring: Secondary | ICD-10-CM | POA: Diagnosis not present

## 2023-09-20 DIAGNOSIS — I5022 Chronic systolic (congestive) heart failure: Secondary | ICD-10-CM

## 2023-09-20 DIAGNOSIS — Z79899 Other long term (current) drug therapy: Secondary | ICD-10-CM | POA: Insufficient documentation

## 2023-09-20 DIAGNOSIS — I484 Atypical atrial flutter: Secondary | ICD-10-CM | POA: Insufficient documentation

## 2023-09-20 DIAGNOSIS — G4733 Obstructive sleep apnea (adult) (pediatric): Secondary | ICD-10-CM

## 2023-09-20 DIAGNOSIS — I422 Other hypertrophic cardiomyopathy: Secondary | ICD-10-CM | POA: Diagnosis not present

## 2023-09-20 DIAGNOSIS — I340 Nonrheumatic mitral (valve) insufficiency: Secondary | ICD-10-CM | POA: Insufficient documentation

## 2023-09-20 DIAGNOSIS — I4821 Permanent atrial fibrillation: Secondary | ICD-10-CM | POA: Insufficient documentation

## 2023-09-20 DIAGNOSIS — I493 Ventricular premature depolarization: Secondary | ICD-10-CM | POA: Insufficient documentation

## 2023-09-20 DIAGNOSIS — I472 Ventricular tachycardia, unspecified: Secondary | ICD-10-CM | POA: Diagnosis not present

## 2023-09-20 DIAGNOSIS — Z7901 Long term (current) use of anticoagulants: Secondary | ICD-10-CM | POA: Diagnosis not present

## 2023-09-20 DIAGNOSIS — Z7984 Long term (current) use of oral hypoglycemic drugs: Secondary | ICD-10-CM | POA: Diagnosis not present

## 2023-09-20 DIAGNOSIS — I2510 Atherosclerotic heart disease of native coronary artery without angina pectoris: Secondary | ICD-10-CM | POA: Insufficient documentation

## 2023-09-20 LAB — BASIC METABOLIC PANEL WITH GFR
Anion gap: 5 (ref 5–15)
BUN: 13 mg/dL (ref 8–23)
CO2: 24 mmol/L (ref 22–32)
Calcium: 9.3 mg/dL (ref 8.9–10.3)
Chloride: 108 mmol/L (ref 98–111)
Creatinine, Ser: 0.92 mg/dL (ref 0.61–1.24)
GFR, Estimated: >60 mL/min (ref >60)
Glucose, Bld: 100 mg/dL — ABNORMAL HIGH (ref 70–99)
Potassium: 4.5 mmol/L (ref 3.5–5.1)
Sodium: 137 mmol/L (ref 135–145)

## 2023-09-20 LAB — BRAIN NATRIURETIC PEPTIDE: B Natriuretic Peptide: 422.3 pg/mL — ABNORMAL HIGH (ref 0.0–100.0)

## 2023-09-20 MED ORDER — METOPROLOL SUCCINATE ER 50 MG PO TB24: 50.0000 mg | ORAL_TABLET | Freq: Every day | ORAL | 3 refills | Status: AC

## 2023-09-20 NOTE — Progress Notes (Signed)
 PCP: Medicine, [facility] (Inactive) EP: Dr. Nancey HF Cardiology: Dr. Rolan  Chief Complaint: CHF  63 y.o. with hypertrophic nonobstructive cardiomyopathy (gene+) was referred by Dr. Fernande for evaluation of newly noted systolic dysfunction.  Patient has HCM as evidenced by severe asymmetric septal hypertrophy.  He does not have LVOT obstruction or SAM.  His brother had SCD at 18, presumable due to HCM, and patient has a Environmental manager ICD.  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  Patient has struggled with atrial fibrillation.  He was seen by Dr. Kelsie a couple of years ago and decided against AF ablation.  He is currently maintained on Tikosyn .  He was on ranolazone + Tikosyn  but developed VT x 2 on this combination and ranolazine  was stopped.  Most recently, had DCCV 01/03/20.  He is now back in atrial fibrillation.  He feels the atrial fibrillation when his rate gets fast (>100 bpm).  Today, rate is controlled in the 70s.  Echo in 4/18 showed EF 55-60%, but echo in 9/21 showed EF down to 25-30% with moderate-severe MR (no SAM).   Patient had RHC/LHC in 12/21 showing 90% stenosis in small ostial D1 and small ostial OM2, no interventional target.  Cardiac output was preserved with normal filling pressures.  TEE showed EF 30-35% with 3+ mitral regurgitation.   Patient saw Dr. Dusty for evaluation for MV repair, surgical Maze, epicardial LV lead.  Stress echo in 2/22 showed mild MR at rest and moderate MR at stress.  It was decided that MR was not severe enough to warrant surgery.   CPX in 2/22 showed moderate-severe HF functional limitation.   Patient had CRT upgrade in 4/22 then DCCV later in 4/22 to NSR. He had a recurrence of atrial flutter with DCCV in 6/22.  Cardioversion lasted less than a day before he was back in atrial flutter again. Patient was evaluated at [facility] for 2nd  opinion on atrial flutter/fibrillation ablation, but they also thought that he would be unlikely to hold NSR.   Echo was done in 10/22, showing EF 30-35% with severe focal basal septal hypertrophy, no LVOT obstruction, no SAM, moderate MR, mild RV enlargement with mildly decreased systolic function, severe LAE, and normal IVC.   Echo 12/23 showing EF 45-50%, severe focal basal septal hypertrophy with no LVOT obstruction, no SAM, mild-moderate MR, normal RV, normal IVC.   s/p AV nodal ablation 5/24 with Dr. Nancey. Post procedure follow up, bisoprolol  switched to Toprol  XL due to episode of symptomatic NSVT on device interrogation.   Echo in 6/25 showed EF 40-45% with severe asymmetric septal hypertrophy, no LVOT gradient, no mitral valve SAM, mild-moderate MR, moderate TR, IVC normal, RV normal.   Today he returns for HF follow up. Continues to work full time.  Has generalized fatigue.  No dyspnea or chest pain with his usual activities.  No orthopnea/PND.  No lightheadedness.  No palpitations.  Besides fatigue, has been feeling well overall. He does snore at night.   [employer] ICD interrogation: Unable to get transmission back today.   ECG (personally reviewed): Atrial fibrillation, PVCs, BiV pacing  Labs (12/23): LDL 69 Labs (4/24): K 4.4, creatinine 0.99 Labs (6/25): LDL 59, K 4.8, creatinine 9.08  PMH: 1. Atrial fibrillation/flutter: Was on ranolazine  + Tikosyn  but had VT, ranolazine  stopped.  Now on Tikosyn .  - DCCV 11/21. - DCCV 4/22 - DCCV 6/22: lasted <  1 day.  2. Hypertrophic cardiomyopathy: No LVOT obstruction or SAM.  Gene+.  Brother with SCD at 39.  3. Chronic systolic CHF: Nonischemic cardiomyopathy, likely due to burned out HCM.  [employer] CRT-D device.  - Echo (4/18): Severe asymmetric septal hypertrophy, no SAM, moderate MR, EF 55-60%.  - Echo (9/21): Severe asymmetric septal hypertrophy, no SAM or LVOT obstruction, EF 25-30%, mildly decreased RV systolic function,  PASP 60 mmHg, severe LAE, severe MR with restricted posterior leaflet.  - TEE (12/21): severe asymmetric basal septal hypertrophy, EF 30-35%, mildly decreased RV systolic function, severe LAE, moderate TR, mild AI, restricted posterior MV leaflet with 2 MR jets, ERO 0.3 (suspect 3+ MR, Carpentier IIIb).  - LHC/RHC (12/21): 90% stenosis in small ostial D1 and small ostial OM2, no interventional target. Mean RA 3, PA 38/14, mean PCWP 14 with v waves to 22, CI 2.66, PVR 2.1 WU.  - CPX (2/22): RER 1.28, VE/VCO2 47, peak VO2 18.5.  Moderate-severe functional limitation due to HF, suspect severe elevation LA pressure/PA pressure with exercise.  - 4/22 upgrade to [facility] Scientific CRT-D.  - Echo (10/22): EF 30-35% with severe focal basal septal hypertrophy, no LVOT obstruction, no SAM, moderate MR, mild RV enlargement with mildly decreased systolic function, severe LAE, and normal IVC.  - Echo (12/23): EF 45-50%, severe focal basal septal hypertrophy with no LVOT obstruction, no SAM, mild-moderate MR, normal RV, normal IVC.  - AV nodal ablation in 5/24 - Echo (6/25): EF 40-45% with severe asymmetric septal hypertrophy, no LVOT gradient, no mitral valve SAM, mild-moderate MR, moderate TR, IVC normal, RV normal.  4. VT: 9/21, suspected to be due to ranolazine  + Tikosyn  combination, ranolazine  stopped.  5. H/o nephrolithiasis.  6. Mitral regurgitation: Moderate to severe by my review of 9/21 echo,  restriction of posterior leaflet.  - TEE (12/21): Restricted posterior MV leaflet with 2 MR jets, ERO 0.3 (suspect 3+ MR, Carpentier IIIb). - Stress echo (2/22): mild MR at rest, moderate MR with stress.  - Moderate on 10/22 echo.  7. LBBB  FH: Mother with atrial fibrillation.  Brother with SCD at 21.  Son also with HCM gene+.   Social History   Socioeconomic History   Marital status: Married    Spouse name: Not on file   Number of children: Not on file   Years of education: Not on file   Highest education  level: Not on file  Occupational History   Not on file  Tobacco Use   Smoking status: Never   Smokeless tobacco: Former    Types: Chew    Quit date: 1995  Vaping Use   Vaping status: Never Used  Substance and Sexual Activity   Alcohol use: Yes    Alcohol/week: 3.0 standard drinks of alcohol    Types: 3 Cans of beer per week   Drug use: No   Sexual activity: Yes  Other Topics Concern   Not on file  Social History Narrative   Lives with spouse in [place]   employed   Social Drivers of Health   Financial Resource Strain: Not on file  Food Insecurity: No Food Insecurity (07/24/2021)   Received from [facility]   Hunger Vital Sign    Within the past 12 months, you worried that your food would run out before you got the money to buy more.: Never true    Within the past 12 months, the food you bought just didn't last and you didn't have money to get  more.: Never true  Transportation Needs: Not on file  Physical Activity: Not on file  Stress: Not on file  Social Connections: Unknown (07/02/2021)   [facility]   Social Network    Social Network: Not on file  Intimate Partner Violence: Unknown (06/04/2021)   Received from [facility]   HITS    Physically Hurt: Not on file    Insult or Talk Down To: Not on file    Threaten Physical Harm: Not on file    Scream or Curse: Not on file   ROS: All systems reviewed and negative except as per HPI.   Current Outpatient Medications  Medication Sig Dispense Refill   acetaminophen  (TYLENOL ) 325 MG tablet Take 650 mg by mouth every 6 (six) hours as needed for mild pain or moderate pain (for pain.).     apixaban  (ELIQUIS ) 5 MG TABS tablet TAKE 1 TABLET(5 MG) BY MOUTH IN THE MORNING AND AT BEDTIME 180 tablet 3   empagliflozin  (JARDIANCE ) 10 MG TABS tablet Take 1 tablet (10 mg total) by mouth daily. 90 tablet 1   loratadine  (CLARITIN ) 10 MG tablet Take 10 mg by mouth daily as needed (seasonal allergies).     magnesium   oxide (MAG-OX) 400 MG tablet TAKE 1 TABLET BY MOUTH EVERY DAY 30 tablet 11   metoprolol  succinate (TOPROL -XL) 100 MG 24 hr tablet TAKE 1 TABLET(100 MG) BY MOUTH DAILY WITH OR IMMEDIATELY FOLLOWING A MEAL 90 tablet 3   rosuvastatin  (CRESTOR ) 10 MG tablet TAKE 1 TABLET(10 MG) BY MOUTH DAILY 90 tablet 3   sacubitril-valsartan (ENTRESTO ) 49-51 MG Take 1 tablet by mouth 2 (two) times daily. 180 tablet 2   spironolactone  (ALDACTONE ) 25 MG tablet Take 1 tablet (25 mg total) by mouth daily. 90 tablet 3   metoprolol  succinate (TOPROL  XL) 50 MG 24 hr tablet Take 1 tablet (50 mg total) by mouth at bedtime. This will be in addition to the 100 mg taken daily to equal 125 mg total daily 90 tablet 3   No current facility-administered medications for this encounter.   Wt Readings from Last 3 Encounters:  09/20/23 82.9 kg (182 lb 12.8 oz)  08/11/23 81.5 kg (179 lb 9.6 oz)  04/27/23 79.8 kg (176 lb)   BP 110/78   Pulse 70   Wt 82.9 kg (182 lb 12.8 oz)   SpO2 97%   BMI 28.63 kg/m  General: NAD Neck: No JVD, no thyromegaly or thyroid  nodule.  Lungs: Clear to auscultation bilaterally with normal respiratory effort. CV: Nondisplaced PMI.  Heart regular S1/S2, no S3/S4, no murmur.  No peripheral edema.  No carotid bruit.  Normal pedal pulses.  Abdomen: Soft, nontender, no hepatosplenomegaly, no distention.  Skin: Intact without lesions or rashes.  Neurologic: Alert and oriented x 3.  Psych: Normal affect. Extremities: No clubbing or cyanosis.  HEENT: Normal.    Assessment/Plan: 1. Chronic systolic CHF: Echo in 9/21 showed severe asymmetric septal hypertrophy, no SAM or LVOT obstruction, EF 25-30%, mildly decreased RV systolic function, PASP 60 mmHg, severe LAE, severe MR with restricted posterior leaflet.  EF was lower than in the past (normal in 4/18) and mitral regurgitation was worse.  LHC/RHC in 12/21 showed 90% stenosis small D1 and 90% stenosis small OM2 (CAD does not explain cardiomyopathy) and  normal filling pressures/preserved cardiac output.  Cardiomyopathy could be natural history of HCM with worsening of function over time.  CPX in 2/22 showed moderate-severe functional limitation.  [employer] CRT-D upgrade in 4/22.  He is now in persistent atrial fibrillation/atypical flutter, failed 6/22 DCCV.  Echo 12/23 showed improved EF 45-50%, severe focal basal septal hypertrophy with no LVOT obstruction, no SAM, mild-moderate MR, normal RV, normal IVC. Given permanent AF/AFL, he is now post AV nodal ablation to maximize BiV pacing.  Echo in 6/25 showed EF 40-45% with severe asymmetric septal hypertrophy, no LVOT gradient, no mitral valve SAM, mild-moderate MR, moderate TR, IVC normal, RV normal. NYHA class II, not volume overloaded on exam.  - With ongoing PVCs, increase Toprol  XL to 150 mg daily.  - Continue Entresto  49/51 bid.  - Continue Jardiance  10 mg daily.   - With EF > 40% and HCM, I think he can stop digoxin .  - Continue spironolactone  25 mg daily. BMET/BNP today.  - I do not think he needs Lasix.   2. Atrial fibrillation/flutter: Paroxysmal => chronic.  He had DCCV 01/03/20 but went back into atrial fibrillation despite Tikosyn  use.  He is off ranolazine  due to VT with combination of Tikosyn  and ranolazine .  Atrial fibrillation ablation likely has a lower chance of success here due to the size of the left atrium (severely enlarged).  DCCV in 4/22 was successful but flutter recurred. DCCV in 6/22 only held for about a day. He was seen at [facility] for 2nd opinion, did not think ablation would be successful.  He is now off Tikosyn  due to futility. Would not try DCCV again. S/p AV node ablation (5/24) to promote BIV pacing. - Increasing Toprol  XL to 150 mg daily. . - Continue apixaban  5 mg bid.  3. Mitral regurgitation: TEE in 12/21 with 3+ MR, Carpentier IIIb.  Stress echo in 4/22 showed moderate MR with stress.  He saw Dr. Dusty, MR not thought bad enough to warrant surgical repair. CRT  upgrade may help limit MR in the long-term. Mild-moderate MR on most recent  echo in 6/25.  4. Hypertrophic cardiomyopathy: No SAM or LVOT obstruction, has asymmetric septal hypertrophy. He does not have a significant murmur. Gene +, brother with SCD at 57.   - Continue Toprol  XL 150 mg daily.  Without LVOT obstruction and with low EF, would not benefit from [facility] or septal myectomy.  5. VT/PVCs: Most recent episode of VT in 2/25, treated with ATP.  - Increasing Toprol  XL as above.  - With frequent PVCs and VT, ?if he would benefit from amiodarone.  He will have followup with Dr. Nancey.   6. CAD: LHC (12/21) with 90% ostial small D1 and 90% ostial small OM2.  CAD does not explain the extent of his cardiomyopathy.  No chest pain.  - Continue Crestor , good lipids in 6/25.  - No ASA given Eliquis  use.  7. OSA: Suspect OSA, has fatigue and snoring.   - I will try to arrange for home sleep study.   Follow up in 4 months with APP   I spent 31 minutes reviewing records, interviewing/examining patient, and managing orders.   Ezra Shuck  09/20/2023

## 2023-09-20 NOTE — Patient Instructions (Addendum)
 STOP Digoxin .  INCREASE Toprol  XL to 150 mg nightly.  Labs done today, your results will be available in MyChart, we will contact you for abnormal readings.  Your provider has recommended that you have a home sleep study ([facility] Test).  We have provided you with the equipment in our office today. Please go ahead and download the app. DO NOT OPEN OR TAMPER WITH THE BOX UNTIL WE ADVISE YOU TO DO SO. Once insurance has approved the test our office will call you with PIN number and approval to proceed with testing. Once you have completed the test you just dispose of the equipment, the information is automatically uploaded to us  via blue-tooth technology. If your test is positive for sleep apnea and you need a home CPAP machine you will be contacted by Dr Dorine office [facility]) to set this up.  PLEASE CALL Dr. Marko office to arrange your follow up appointment.  Your physician recommends that you schedule a follow-up appointment in: 4 months.  If you have any questions or concerns before your next appointment please send us  a message through [place] or call our office at [phone].    TO LEAVE A MESSAGE FOR THE NURSE SELECT OPTION 2, PLEASE LEAVE A MESSAGE INCLUDING: YOUR NAME DATE OF BIRTH CALL BACK NUMBER REASON FOR CALL**this is important as we prioritize the call backs  YOU WILL RECEIVE A CALL BACK THE SAME DAY AS LONG AS YOU CALL BEFORE 4:00 PM  At the [facility], you and your health needs are our priority. As part of our continuing mission to provide you with exceptional heart care, we have created designated Provider Care Teams. These Care Teams include your primary Cardiologist (physician) and Advanced Practice Providers (APPs- Physician Assistants and Nurse Practitioners) who all work together to provide you with the care you need, when you need it.   You may see any of the following providers on your designated Care Team at your next follow up: Dr  Toribio Fuel Dr Ezra Shuck Dr. Ria Commander Dr. Morene Brownie Amy Lenetta, NP Caffie Shed, [place] [facility] [place], [place] Beckey Coe, NP [place] Lee, NP Ellouise Class, NP Tinnie Redman, PharmD Jaun Bash, PharmD   Please be sure to bring in all your medications bottles to every appointment.    Thank you for choosing [facility]

## 2023-09-24 ENCOUNTER — Ambulatory Visit: Attending: Cardiology

## 2023-09-24 DIAGNOSIS — I5022 Chronic systolic (congestive) heart failure: Secondary | ICD-10-CM

## 2023-09-24 NOTE — Procedures (Signed)
   SLEEP STUDY REPORT Patient Information Study Date: 09/20/2023 Patient Name: Donald Bond Patient ID: [id number] Birth Date: [birth year] Age: 63 Gender: Male BMI: 28.7 (W=183 lb, H=5' 7'') Stopbang: 5 Referring Physician: Ezra Shuck, MD  TEST DESCRIPTION: Home sleep apnea testing was completed using the WatchPat, a Type 1 device, utilizing  peripheral arterial tonometry (PAT), chest movement, actigraphy, pulse oximetry, pulse rate, body position and snore.  AHI was calculated with apnea and hypopnea using valid sleep time as the denominator. RDI includes apneas,  hypopneas, and RERAs. The data acquired and the scoring of sleep and all associated events were performed in  accordance with the recommended standards and specifications as outlined in the AASM Manual for the Scoring of  Sleep and Associated Events 2.2.0 (2015).   FINDINGS:   1. Mild Obstructive Sleep Apnea with AHI 5.8/hr.   2. No Central Sleep Apnea with pAHIc 3/hr.   3. Oxygen desaturations as low as 87%.   4. Mild to moderate snoring was present. O2 sats were < 88% for 0.1 min.   5. Total sleep time was 6 hrs and 51 min.   6. 13,8% of total sleep time was spent in REM sleep.   7. Normal sleep onset latency at 26 min.   8. Shortened REM sleep onset latency at 59 min.   9. Total awakenings were 10 .  10. Arrhythmia detection: None  DIAGNOSIS: Mild Obstructive Sleep Apnea (G47.33)  RECOMMENDATIONS: 1. Clinical correlation of these findings is necessary. The decision to treat obstructive sleep apnea (OSA) is usually  based on the presence of apnea symptoms or the presence of associated medical conditions such as Hypertension,  Congestive Heart Failure, Atrial Fibrillation or Obesity. The most common symptoms of OSA are snoring, gasping for  breath while sleeping, daytime sleepiness and fatigue.  2. Initiating apnea therapy is recommended given the presence of symptoms and/or associated conditions.  Recommend  proceeding with one of the following:  a. Auto-CPAP therapy with a pressure range of 5-20cm H2O.  b. An oral appliance (OA) that can be obtained from certain dentists with expertise in sleep medicine. These are  primarily of use in non-obese patients with mild and moderate disease.  c. An ENT consultation which may be useful to look for specific causes of obstruction and possible treatment  options.  d. If patient is intolerant to PAP therapy, consider referral to ENT for evaluation for hypoglossal nerve stimulator.  3. Close follow-up is necessary to ensure success with CPAP or oral appliance therapy for maximum benefit . 4. A follow-up oximetry study on CPAP is recommended to assess the adequacy of therapy and determine the need  for supplemental oxygen or the potential need for Bi-level therapy. An arterial blood gas to determine the adequacy of  baseline ventilation and oxygenation should also be considered. 5. Healthy sleep recommendations include: adequate nightly sleep (normal 7-9 hrs/night), avoidance of caffeine after  noon and alcohol near bedtime, and maintaining a sleep environment that is cool, dark and quiet. 6. Weight loss for overweight patients is recommended. Even modest amounts of weight loss can significantly  improve the severity of sleep apnea. 7. Snoring recommendations include: weight loss where appropriate, side sleeping, and avoidance of alcohol before  bed. 8. Operation of motor vehicle should be avoided when sleepy.  Signature: Wilbert Bihari, MD; [facility]; Diplomat, American Board of Sleep  Medicine Electronically Signed: 09/24/2023 3:55:28 PM

## 2023-09-30 ENCOUNTER — Telehealth: Payer: Self-pay

## 2023-09-30 NOTE — Telephone Encounter (Signed)
 Left VM with callback number, notified patient that he will get a call from scheduler to set up OV to discuss treatment options.

## 2023-09-30 NOTE — Telephone Encounter (Signed)
-----   Message from Wilbert Bihari sent at 09/24/2023  3:56 PM EDT ----- Patient has very mild OSA - set up OV to discuss treatment options.

## 2023-10-20 ENCOUNTER — Other Ambulatory Visit (HOSPITAL_COMMUNITY): Payer: Self-pay | Admitting: Internal Medicine

## 2023-10-20 ENCOUNTER — Other Ambulatory Visit (HOSPITAL_COMMUNITY): Payer: Self-pay | Admitting: *Deleted

## 2023-10-20 DIAGNOSIS — I5022 Chronic systolic (congestive) heart failure: Secondary | ICD-10-CM

## 2023-10-20 MED ORDER — EMPAGLIFLOZIN 10 MG PO TABS: 10.0000 mg | ORAL_TABLET | Freq: Every day | ORAL | 3 refills | Status: AC

## 2023-10-26 ENCOUNTER — Ambulatory Visit: Payer: Self-pay

## 2023-11-01 ENCOUNTER — Ambulatory Visit (INDEPENDENT_AMBULATORY_CARE_PROVIDER_SITE_OTHER): Payer: Self-pay

## 2023-11-01 DIAGNOSIS — I5022 Chronic systolic (congestive) heart failure: Secondary | ICD-10-CM | POA: Diagnosis not present

## 2023-11-03 LAB — CUP PACEART REMOTE DEVICE CHECK
Battery Remaining Longevity: 84 mo
Battery Remaining Percentage: 97 %
Brady Statistic RA Percent Paced: 0 %
Brady Statistic RV Percent Paced: 90 %
Date Time Interrogation Session: 20250901040100
HighPow Impedance: 54 Ohm
Implantable Lead Connection Status: 753985
Implantable Lead Connection Status: 753985
Implantable Lead Connection Status: 753985
Implantable Lead Implant Date: 20000815
Implantable Lead Implant Date: 20220401
Implantable Lead Implant Date: 20220401
Implantable Lead Location: 753858
Implantable Lead Location: 753859
Implantable Lead Location: 753860
Implantable Lead Model: 144
Implantable Lead Model: 7841
Implantable Lead Serial Number: 1129476
Implantable Lead Serial Number: 334759
Implantable Pulse Generator Implant Date: 20220401
Lead Channel Impedance Value: 1053 Ohm
Lead Channel Impedance Value: 886 Ohm
Lead Channel Pacing Threshold Amplitude: 0.8 V
Lead Channel Pacing Threshold Pulse Width: 0.4 ms
Lead Channel Setting Pacing Amplitude: 2 V
Lead Channel Setting Pacing Amplitude: 3 V
Lead Channel Setting Pacing Pulse Width: 0.4 ms
Lead Channel Setting Pacing Pulse Width: 0.5 ms
Lead Channel Setting Sensing Sensitivity: 0.5 mV
Lead Channel Setting Sensing Sensitivity: 1 mV
Pulse Gen Serial Number: 389080

## 2023-11-04 ENCOUNTER — Ambulatory Visit: Payer: Self-pay | Admitting: Cardiovascular Disease

## 2023-11-09 NOTE — Progress Notes (Signed)
Remote ICD Transmission.

## 2024-01-20 ENCOUNTER — Telehealth (HOSPITAL_COMMUNITY): Payer: Self-pay

## 2024-01-20 NOTE — Telephone Encounter (Signed)
 Called to confirm/remind patient of their appointment at the [facility] on 01/21/24.   Appointment:   [x] Confirmed  [] Left mess   [] No answer/No voice mail  [] VM Full/unable to leave message  [] Phone not in service  Patient reminded to bring all medications and/or complete list.  Confirmed patient has transportation. Gave directions, instructed to utilize valet parking.

## 2024-01-20 NOTE — Progress Notes (Signed)
 PCP: Medicine, [facility] (Inactive) EP: Dr. Nancey HF Cardiology: Dr. Rolan  63 y.o. with hypertrophic nonobstructive cardiomyopathy (gene+) was referred by Dr. Fernande for evaluation of newly noted systolic dysfunction.  Patient has HCM as evidenced by severe asymmetric septal hypertrophy.  He does not have LVOT obstruction or SAM.  His brother had SCD at 35, presumable due to HCM, and patient has a Environmental Manager ICD.  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  Patient has struggled with atrial fibrillation.  He was seen by Dr. Kelsie a couple of years ago and decided against AF ablation.  He is currently maintained on Tikosyn .  He was on ranolazone + Tikosyn  but developed VT x 2 on this combination and ranolazine  was stopped.  Most recently, had DCCV 01/03/20.  He is now back in atrial fibrillation.  He feels the atrial fibrillation when his rate gets fast (>100 bpm).  Today, rate is controlled in the 70s.  Echo in 4/18 showed EF 55-60%, but echo in 9/21 showed EF down to 25-30% with moderate-severe MR (no SAM).   Patient had RHC/LHC in 12/21 showing 90% stenosis in small ostial D1 and small ostial OM2, no interventional target.  Cardiac output was preserved with normal filling pressures.  TEE showed EF 30-35% with 3+ mitral regurgitation.   Patient saw Dr. Dusty for evaluation for MV repair, surgical Maze, epicardial LV lead.  Stress echo in 2/22 showed mild MR at rest and moderate MR at stress.  It was decided that MR was not severe enough to warrant surgery.   CPX in 2/22 showed moderate-severe HF functional limitation.   Patient had CRT upgrade in 4/22 then DCCV later in 4/22 to NSR. He had a recurrence of atrial flutter with DCCV in 6/22.  Cardioversion lasted less than a day before he was back in atrial flutter again. Patient was evaluated at [facility] for 2nd opinion on atrial  flutter/fibrillation ablation, but they also thought that he would be unlikely to hold NSR.   Echo was done in 10/22, showing EF 30-35% with severe focal basal septal hypertrophy, no LVOT obstruction, no SAM, moderate MR, mild RV enlargement with mildly decreased systolic function, severe LAE, and normal IVC.   Echo 12/23 showing EF 45-50%, severe focal basal septal hypertrophy with no LVOT obstruction, no SAM, mild-moderate MR, normal RV, normal IVC.   s/p AV nodal ablation 5/24 with Dr. Nancey. Post procedure follow up, bisoprolol  switched to Toprol  XL due to episode of symptomatic NSVT on device interrogation.   Echo in 6/25 showed EF 40-45% with severe asymmetric septal hypertrophy, no LVOT gradient, no mitral valve SAM, mild-moderate MR, moderate TR, IVC normal, RV normal.   Today he returns for HF follow up. Overall feeling fine. He has mild SOB walking up steps. 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 Had episodes of chest tingle, non-exertional, resolved spontaneously with massage. Denies palpitations, abnormal bleeding, dizziness, edema, or PND/Orthopnea. Appetite ok. Weight at home 178 pounds. Taking all medications. Motivated to work on weight loss.  [employer] ICD interrogation (personally reviewed): unable to  interrogate today.  Labs (12/23): LDL 69 Labs (4/24): K 4.4, creatinine 0.99 Labs (6/25): LDL 59, K 4.8, creatinine 9.08 Labs (7/25): K 4.5, creatinine 0.92  PMH: 1. Atrial fibrillation/flutter: Was on ranolazine  + Tikosyn  but had VT, ranolazine  stopped.  Now on Tikosyn .  - DCCV 11/21. -  DCCV 4/22 - DCCV 6/22: lasted < 1 day.  2. Hypertrophic cardiomyopathy: No LVOT obstruction or SAM.  Gene+.  Brother with SCD at 28.  3. Chronic systolic CHF: Nonischemic cardiomyopathy, likely due to burned out HCM.  [employer] CRT-D device.  - Echo (4/18): Severe asymmetric septal hypertrophy, no SAM, moderate MR, EF 55-60%.  - Echo (9/21): Severe  asymmetric septal hypertrophy, no SAM or LVOT obstruction, EF 25-30%, mildly decreased RV systolic function, PASP 60 mmHg, severe LAE, severe MR with restricted posterior leaflet.  - TEE (12/21): severe asymmetric basal septal hypertrophy, EF 30-35%, mildly decreased RV systolic function, severe LAE, moderate TR, mild AI, restricted posterior MV leaflet with 2 MR jets, ERO 0.3 (suspect 3+ MR, Carpentier IIIb).  - LHC/RHC (12/21): 90% stenosis in small ostial D1 and small ostial OM2, no interventional target. Mean RA 3, PA 38/14, mean PCWP 14 with v waves to 22, CI 2.66, PVR 2.1 WU.  - CPX (2/22): RER 1.28, VE/VCO2 47, peak VO2 18.5.  Moderate-severe functional limitation due to HF, suspect severe elevation LA pressure/PA pressure with exercise.  - 4/22 upgrade to [facility] Scientific CRT-D.  - Echo (10/22): EF 30-35% with severe focal basal septal hypertrophy, no LVOT obstruction, no SAM, moderate MR, mild RV enlargement with mildly decreased systolic function, severe LAE, and normal IVC.  - Echo (12/23): EF 45-50%, severe focal basal septal hypertrophy with no LVOT obstruction, no SAM, mild-moderate MR, normal RV, normal IVC.  - AV nodal ablation in 5/24 - Echo (6/25): EF 40-45% with severe asymmetric septal hypertrophy, no LVOT gradient, no mitral valve SAM, mild-moderate MR, moderate TR, IVC normal, RV normal.  4. VT: 9/21, suspected to be due to ranolazine  + Tikosyn  combination, ranolazine  stopped.  5. H/o nephrolithiasis.  6. Mitral regurgitation: Moderate to severe by my review of 9/21 echo,  restriction of posterior leaflet.  - TEE (12/21): Restricted posterior MV leaflet with 2 MR jets, ERO 0.3 (suspect 3+ MR, Carpentier IIIb). - Stress echo (2/22): mild MR at rest, moderate MR with stress.  - Moderate on 10/22 echo.  7. LBBB  FH: Mother with atrial fibrillation.  Brother with SCD at 57.  Son also with HCM gene+.   Social History   Socioeconomic History   Marital status: Married    Spouse  name: Not on file   Number of children: Not on file   Years of education: Not on file   Highest education level: Not on file  Occupational History   Not on file  Tobacco Use   Smoking status: Never   Smokeless tobacco: Former    Types: Chew    Quit date: 1995  Vaping Use   Vaping status: Never Used  Substance and Sexual Activity   Alcohol use: Yes    Alcohol/week: 3.0 standard drinks of alcohol    Types: 3 Cans of beer per week   Drug use: No   Sexual activity: Yes  Other Topics Concern   Not on file  Social History Narrative   Lives with spouse in [place]   employed   Social Drivers of Health   Financial Resource Strain: Low Risk  (10/27/2023)   Received from [facility]   Overall Financial Resource Strain (CARDIA)    How hard is it for you to pay for the very basics like food, housing, medical care, and heating?: Not hard at all  Food Insecurity: No Food Insecurity (10/27/2023)   Received from [facility]   Hunger Vital Sign  Within the past 12 months, you worried that your food would run out before you got the money to buy more.: Never true    Within the past 12 months, the food you bought just didn't last and you didn't have money to get more.: Never true  Transportation Needs: No Transportation Needs (10/27/2023)   Received from [facility] - Transportation    In the past 12 months, has lack of transportation kept you from medical appointments or from getting medications?: No    In the past 12 months, has lack of transportation kept you from meetings, work, or from getting things needed for daily living?: No  Physical Activity: Insufficiently Active (10/27/2023)   Received from [facility]   Exercise Vital Sign    On average, how many days per week do you engage in moderate to strenuous exercise (like a brisk walk)?: 3 days    On average, how many minutes do you engage in exercise at this level?: 20 min  Stress: No Stress Concern Present  (10/27/2023)   Received from [facility] - Occupational Stress Questionnaire    Do you feel stress - tense, restless, nervous, or anxious, or unable to sleep at night because your mind is troubled all the time - these days?: Only a little  Social Connections: Moderately Integrated (10/27/2023)   Received from [facility]   Social Network    How would you rate your social network (family, work, friends)?: Adequate participation with social networks  Intimate Partner Violence: Not At Risk (10/27/2023)   Received from [facility]   HITS    Over the last 12 months how often did your partner scream or curse at you?: Never    Over the last 12 months how often did your partner threaten you with physical harm?: Never    Over the last 12 months how often did your partner insult you or talk down to you?: Never    Over the last 12 months how often did your partner physically hurt you?: Never   ROS: All systems reviewed and negative except as per HPI.   Current Outpatient Medications  Medication Sig Dispense Refill   acetaminophen  (TYLENOL ) 325 MG tablet Take 650 mg by mouth every 6 (six) hours as needed for mild pain or moderate pain (for pain.).     apixaban  (ELIQUIS ) 5 MG TABS tablet TAKE 1 TABLET(5 MG) BY MOUTH IN THE MORNING AND AT BEDTIME 180 tablet 3   empagliflozin  (JARDIANCE ) 10 MG TABS tablet Take 1 tablet (10 mg total) by mouth daily. 90 tablet 3   loratadine  (CLARITIN ) 10 MG tablet Take 10 mg by mouth daily as needed (seasonal allergies).     magnesium  oxide (MAG-OX) 400 MG tablet TAKE 1 TABLET BY MOUTH EVERY DAY 30 tablet 11   metoprolol  succinate (TOPROL  XL) 50 MG 24 hr tablet Take 1 tablet (50 mg total) by mouth at bedtime. This will be in addition to the 100 mg taken daily to equal 125 mg total daily 90 tablet 3   metoprolol  succinate (TOPROL -XL) 100 MG 24 hr tablet TAKE 1 TABLET(100 MG) BY MOUTH DAILY WITH OR IMMEDIATELY FOLLOWING A MEAL  (Patient taking differently: Take 150 mg by mouth daily.) 90 tablet 3   rosuvastatin  (CRESTOR ) 10 MG tablet TAKE 1 TABLET(10 MG) BY MOUTH DAILY 90 tablet 3   sacubitril-valsartan (ENTRESTO ) 49-51 MG Take 1 tablet by mouth 2 (two) times daily. 180 tablet 2  spironolactone  (ALDACTONE ) 25 MG tablet TAKE 1 TABLET(25 MG) BY MOUTH DAILY 90 tablet 3   No current facility-administered medications for this encounter.   Wt Readings from Last 3 Encounters:  01/21/24 83.6 kg (184 lb 6.4 oz)  09/20/23 82.9 kg (182 lb 12.8 oz)  08/11/23 81.5 kg (179 lb 9.6 oz)   BP 116/84   Pulse 69   Wt 83.6 kg (184 lb 6.4 oz)   SpO2 96%   BMI 28.88 kg/m   Physical Exam: General:  NAD. No resp difficulty, walked into clinic HEENT: Normal Neck: Supple. No JVD. Cor: Regular rate & rhythm. No rubs, gallops or murmurs. Lungs: Clear Abdomen: Soft, nontender, nondistended.  Extremities: No cyanosis, clubbing, rash, edema Neuro: Alert & oriented x 3, moves all 4 extremities w/o difficulty. Affect pleasant.   Assessment/Plan: 1. Chronic systolic CHF: Echo in 9/21 showed severe asymmetric septal hypertrophy, no SAM or LVOT obstruction, EF 25-30%, mildly decreased RV systolic function, PASP 60 mmHg, severe LAE, severe MR with restricted posterior leaflet.  EF was lower than in the past (normal in 4/18) and mitral regurgitation was worse.  LHC/RHC in 12/21 showed 90% stenosis small D1 and 90% stenosis small OM2 (CAD does not explain cardiomyopathy) and normal filling pressures/preserved cardiac output.  Cardiomyopathy could be natural history of HCM with worsening of function over time.  CPX in 2/22 showed moderate-severe functional limitation.  [employer] CRT-D upgrade in 4/22.  He is now in persistent atrial fibrillation/atypical flutter, failed 6/22 DCCV.  Echo 12/23 showed improved EF 45-50%, severe focal basal septal hypertrophy with no LVOT obstruction, no SAM, mild-moderate MR, normal RV, normal IVC. Given  permanent AF/AFL, he is now post AV nodal ablation to maximize BiV pacing.  Echo in 6/25 showed EF 40-45% with severe asymmetric septal hypertrophy, no LVOT gradient, no mitral valve SAM, mild-moderate MR, moderate TR, IVC normal, RV normal. NYHA class II, not volume overloaded on exam.  - Continue Toprol  XL 150 mg daily.  - Continue Entresto  49/51 bid.  - Continue Jardiance  10 mg daily.   - Continue spironolactone  25 mg daily. BMET/BNP today.  - I do not think he needs Lasix.   2. Atrial fibrillation/flutter: Paroxysmal => chronic.  He had DCCV 01/03/20 but went back into atrial fibrillation despite Tikosyn  use.  He is off ranolazine  due to VT with combination of Tikosyn  and ranolazine .  Atrial fibrillation ablation likely has a lower chance of success here due to the size of the left atrium (severely enlarged).  DCCV in 4/22 was successful but flutter recurred. DCCV in 6/22 only held for about a day. He was seen at [facility] for 2nd opinion, did not think ablation would be successful.  He is now off Tikosyn  due to futility. Would not try DCCV again. S/p AV node ablation (5/24) to promote BIV pacing. - Continue Toprol  XL 150 mg daily.  - Continue apixaban  5 mg bid. No bleeding issues. CBC today. 3. Mitral regurgitation: TEE in 12/21 with 3+ MR, Carpentier IIIb.  Stress echo in 4/22 showed moderate MR with stress.  He saw Dr. Dusty, MR not thought bad enough to warrant surgical repair. CRT upgrade may help limit MR in the long-term.  - Mild-moderate MR on most recent  echo in 6/25.  4. Hypertrophic cardiomyopathy: No SAM or LVOT obstruction, has asymmetric septal hypertrophy. He does not have a significant murmur. Gene +, brother with SCD at 71.   - Continue Toprol  XL 150 mg daily. Without LVOT obstruction and  with low EF, would not benefit from mavacamten or septal myectomy.  5. VT/PVCs: Most recent episode of VT in 2/25, treated with ATP.  - Continue Toprol  XL as above.  - With frequent PVCs and VT, ?if he  would benefit from amiodarone.   - He will have follow up with Dr. Nancey.   6. CAD: LHC (12/21) with 90% ostial small D1 and 90% ostial small OM2.  CAD does not explain the extent of his cardiomyopathy.  No chest pain.  - Continue Crestor , good lipids in 6/25.  - No ASA given Eliquis  use.  7. OSA: Mild by sleep study - Motivated to work on weight loss  Follow up in 4 months with Dr. Rolan Harlene CHRISTELLA Glena, FNP 01/21/2024

## 2024-01-21 ENCOUNTER — Encounter (HOSPITAL_COMMUNITY): Payer: Self-pay

## 2024-01-21 ENCOUNTER — Ambulatory Visit (HOSPITAL_COMMUNITY): Payer: Self-pay | Admitting: Family Medicine

## 2024-01-21 ENCOUNTER — Ambulatory Visit (HOSPITAL_COMMUNITY)
Admission: RE | Admit: 2024-01-21 | Discharge: 2024-01-21 | Disposition: A | Discharge status: Home / Self Care | Source: Ambulatory Visit | Attending: Family Medicine | Admitting: Family Medicine

## 2024-01-21 VITALS — BP 116/84 | HR 69 | Wt 184.4 lb

## 2024-01-21 DIAGNOSIS — Z7984 Long term (current) use of oral hypoglycemic drugs: Secondary | ICD-10-CM | POA: Insufficient documentation

## 2024-01-21 DIAGNOSIS — Z79899 Other long term (current) drug therapy: Secondary | ICD-10-CM | POA: Diagnosis not present

## 2024-01-21 DIAGNOSIS — I5020 Unspecified systolic (congestive) heart failure: Secondary | ICD-10-CM

## 2024-01-21 DIAGNOSIS — I2510 Atherosclerotic heart disease of native coronary artery without angina pectoris: Secondary | ICD-10-CM | POA: Insufficient documentation

## 2024-01-21 DIAGNOSIS — I340 Nonrheumatic mitral (valve) insufficiency: Secondary | ICD-10-CM | POA: Diagnosis not present

## 2024-01-21 DIAGNOSIS — I480 Paroxysmal atrial fibrillation: Secondary | ICD-10-CM | POA: Insufficient documentation

## 2024-01-21 DIAGNOSIS — I493 Ventricular premature depolarization: Secondary | ICD-10-CM | POA: Insufficient documentation

## 2024-01-21 DIAGNOSIS — I5022 Chronic systolic (congestive) heart failure: Secondary | ICD-10-CM | POA: Diagnosis present

## 2024-01-21 DIAGNOSIS — G4733 Obstructive sleep apnea (adult) (pediatric): Secondary | ICD-10-CM

## 2024-01-21 DIAGNOSIS — I472 Ventricular tachycardia, unspecified: Secondary | ICD-10-CM

## 2024-01-21 DIAGNOSIS — I428 Other cardiomyopathies: Secondary | ICD-10-CM | POA: Insufficient documentation

## 2024-01-21 DIAGNOSIS — Z7901 Long term (current) use of anticoagulants: Secondary | ICD-10-CM | POA: Diagnosis not present

## 2024-01-21 DIAGNOSIS — I422 Other hypertrophic cardiomyopathy: Secondary | ICD-10-CM | POA: Insufficient documentation

## 2024-01-21 DIAGNOSIS — Z87891 Personal history of nicotine dependence: Secondary | ICD-10-CM | POA: Diagnosis not present

## 2024-01-21 DIAGNOSIS — I429 Cardiomyopathy, unspecified: Secondary | ICD-10-CM | POA: Insufficient documentation

## 2024-01-21 DIAGNOSIS — I4821 Permanent atrial fibrillation: Secondary | ICD-10-CM | POA: Diagnosis not present

## 2024-01-21 LAB — BASIC METABOLIC PANEL WITH GFR
Anion gap: 11 (ref 5–15)
BUN: 12 mg/dL (ref 8–23)
CO2: 22 mmol/L (ref 22–32)
Calcium: 9.3 mg/dL (ref 8.9–10.3)
Chloride: 105 mmol/L (ref 98–111)
Creatinine, Ser: 0.73 mg/dL (ref 0.61–1.24)
GFR, Estimated: >60 mL/min (ref >60)
Glucose, Bld: 87 mg/dL (ref 70–99)
Potassium: 4.2 mmol/L (ref 3.5–5.1)
Sodium: 138 mmol/L (ref 135–145)

## 2024-01-21 LAB — CBC
HCT: 49.7 % (ref 39.0–52.0)
Hemoglobin: 16.9 g/dL (ref 13.0–17.0)
MCH: 30.6 pg (ref 26.0–34.0)
MCHC: 34 g/dL (ref 30.0–36.0)
MCV: 90 fL (ref 80.0–100.0)
Platelets: 190 K/uL (ref 150–400)
RBC: 5.52 MIL/uL (ref 4.22–5.81)
RDW: 13.2 % (ref 11.5–15.5)
WBC: 7.7 K/uL (ref 4.0–10.5)
nRBC: 0 % (ref 0.0–0.2)

## 2024-01-21 LAB — BRAIN NATRIURETIC PEPTIDE: B Natriuretic Peptide: 284.5 pg/mL — ABNORMAL HIGH (ref 0.0–100.0)

## 2024-01-21 NOTE — Patient Instructions (Addendum)
 Good to see you today!   Labs done today, your results will be available in MyChart, we will contact you for abnormal readings.  Your physician recommends that you schedule a follow-up appointment 4 months (March)Call office in January to schedule an appointment   If you have any questions or concerns before your next appointment please send us  a message through [place] or call our office at [phone].    TO LEAVE A MESSAGE FOR THE NURSE SELECT OPTION 2, PLEASE LEAVE A MESSAGE INCLUDING: YOUR NAME DATE OF BIRTH CALL BACK NUMBER REASON FOR CALL**this is important as we prioritize the call backs  YOU WILL RECEIVE A CALL BACK THE SAME DAY AS LONG AS YOU CALL BEFORE 4:00 PM At the [facility], you and your health needs are our priority. As part of our continuing mission to provide you with exceptional heart care, we have created designated Provider Care Teams. These Care Teams include your primary Cardiologist (physician) and Advanced Practice Providers (APPs- Physician Assistants and Nurse Practitioners) who all work together to provide you with the care you need, when you need it.   You may see any of the following providers on your designated Care Team at your next follow up: Dr Toribio Fuel Dr Ezra Shuck Dr. Morene Brownie Greig Mosses, NP Caffie Shed, [place] [facility] [place], [place] Beckey Coe, NP Jordan Lee, NP Ellouise Class, NP Tinnie Redman, PharmD Jaun Bash, PharmD   Please be sure to bring in all your medications bottles to every appointment.    Thank you for choosing [facility]

## 2024-01-21 NOTE — Addendum Note (Signed)
 Encounter addended by: Neshawn Aird M, RN on: 01/21/2024 9:36 AM  Actions taken: Charge Capture section accepted

## 2024-01-25 ENCOUNTER — Ambulatory Visit: Payer: Self-pay

## 2024-01-31 ENCOUNTER — Ambulatory Visit (INDEPENDENT_AMBULATORY_CARE_PROVIDER_SITE_OTHER): Payer: Self-pay

## 2024-01-31 DIAGNOSIS — I480 Paroxysmal atrial fibrillation: Secondary | ICD-10-CM | POA: Diagnosis not present

## 2024-02-01 LAB — CUP PACEART REMOTE DEVICE CHECK
Battery Remaining Longevity: 78 mo
Battery Remaining Percentage: 92 %
Brady Statistic RA Percent Paced: 0 %
Brady Statistic RV Percent Paced: 89 %
Date Time Interrogation Session: 20251201040100
HighPow Impedance: 48 Ohm
Implantable Lead Connection Status: 753985
Implantable Lead Connection Status: 753985
Implantable Lead Connection Status: 753985
Implantable Lead Implant Date: 20000815
Implantable Lead Implant Date: 20220401
Implantable Lead Implant Date: 20220401
Implantable Lead Location: 753858
Implantable Lead Location: 753859
Implantable Lead Location: 753860
Implantable Lead Model: 144
Implantable Lead Model: 7841
Implantable Lead Serial Number: 1129476
Implantable Lead Serial Number: 334759
Implantable Pulse Generator Implant Date: 20220401
Lead Channel Impedance Value: 1057 Ohm
Lead Channel Impedance Value: 765 Ohm
Lead Channel Pacing Threshold Amplitude: 0.7 V
Lead Channel Pacing Threshold Pulse Width: 0.4 ms
Lead Channel Setting Pacing Amplitude: 2 V
Lead Channel Setting Pacing Amplitude: 3 V
Lead Channel Setting Pacing Pulse Width: 0.4 ms
Lead Channel Setting Pacing Pulse Width: 0.5 ms
Lead Channel Setting Sensing Sensitivity: 0.5 mV
Lead Channel Setting Sensing Sensitivity: 1 mV
Pulse Gen Serial Number: 389080

## 2024-02-03 ENCOUNTER — Ambulatory Visit: Payer: Self-pay | Admitting: Cardiovascular Disease

## 2024-02-04 NOTE — Progress Notes (Signed)
 Remote ICD Transmission

## 2024-03-29 ENCOUNTER — Other Ambulatory Visit (HOSPITAL_COMMUNITY): Payer: Self-pay | Admitting: Family Medicine

## 2024-04-25 ENCOUNTER — Ambulatory Visit: Payer: Self-pay

## 2024-05-01 ENCOUNTER — Ambulatory Visit: Payer: Self-pay

## 2024-07-25 ENCOUNTER — Ambulatory Visit: Payer: Self-pay

## 2024-07-31 ENCOUNTER — Ambulatory Visit: Payer: Self-pay

## 2024-10-24 ENCOUNTER — Ambulatory Visit: Payer: Self-pay

## 2024-10-30 ENCOUNTER — Ambulatory Visit: Payer: Self-pay

## 2025-01-23 ENCOUNTER — Ambulatory Visit: Payer: Self-pay
